# Patient Record
Sex: Female | Born: 1935 | Race: White | Hispanic: No | Smoking: Former smoker
Health system: Southern US, Community
[De-identification: ages and names within clinical notes are randomized; demographics above are authoritative.]

## PROBLEM LIST (undated history)

## (undated) DIAGNOSIS — R609 Edema, unspecified: Secondary | ICD-10-CM

## (undated) DIAGNOSIS — I739 Peripheral vascular disease, unspecified: Secondary | ICD-10-CM

## (undated) DIAGNOSIS — S72001A Fracture of unspecified part of neck of right femur, initial encounter for closed fracture: Secondary | ICD-10-CM

## (undated) DIAGNOSIS — F0280 Dementia in other diseases classified elsewhere without behavioral disturbance: Secondary | ICD-10-CM

## (undated) DIAGNOSIS — K5900 Constipation, unspecified: Secondary | ICD-10-CM

## (undated) DIAGNOSIS — K5730 Diverticulosis of large intestine without perforation or abscess without bleeding: Secondary | ICD-10-CM

## (undated) DIAGNOSIS — F32A Depression, unspecified: Secondary | ICD-10-CM

## (undated) DIAGNOSIS — E538 Deficiency of other specified B group vitamins: Secondary | ICD-10-CM

## (undated) DIAGNOSIS — D509 Iron deficiency anemia, unspecified: Secondary | ICD-10-CM

## (undated) DIAGNOSIS — G309 Alzheimer's disease, unspecified: Secondary | ICD-10-CM

## (undated) DIAGNOSIS — C180 Malignant neoplasm of cecum: Secondary | ICD-10-CM

## (undated) DIAGNOSIS — K219 Gastro-esophageal reflux disease without esophagitis: Secondary | ICD-10-CM

## (undated) DIAGNOSIS — F329 Major depressive disorder, single episode, unspecified: Secondary | ICD-10-CM

## (undated) DIAGNOSIS — J029 Acute pharyngitis, unspecified: Secondary | ICD-10-CM

## (undated) DIAGNOSIS — H269 Unspecified cataract: Secondary | ICD-10-CM

## (undated) DIAGNOSIS — R1310 Dysphagia, unspecified: Secondary | ICD-10-CM

## (undated) DIAGNOSIS — A048 Other specified bacterial intestinal infections: Secondary | ICD-10-CM

## (undated) DIAGNOSIS — K625 Hemorrhage of anus and rectum: Secondary | ICD-10-CM

## (undated) DIAGNOSIS — K2289 Other specified disease of esophagus: Secondary | ICD-10-CM

## (undated) DIAGNOSIS — M150 Primary generalized (osteo)arthritis: Secondary | ICD-10-CM

## (undated) DIAGNOSIS — M810 Age-related osteoporosis without current pathological fracture: Secondary | ICD-10-CM

## (undated) DIAGNOSIS — I2510 Atherosclerotic heart disease of native coronary artery without angina pectoris: Secondary | ICD-10-CM

## (undated) DIAGNOSIS — R079 Chest pain, unspecified: Secondary | ICD-10-CM

## (undated) DIAGNOSIS — K228 Other specified diseases of esophagus: Secondary | ICD-10-CM

## (undated) DIAGNOSIS — I10 Essential (primary) hypertension: Secondary | ICD-10-CM

## (undated) DIAGNOSIS — C189 Malignant neoplasm of colon, unspecified: Secondary | ICD-10-CM

## (undated) DIAGNOSIS — Z9181 History of falling: Secondary | ICD-10-CM

## (undated) DIAGNOSIS — E785 Hyperlipidemia, unspecified: Secondary | ICD-10-CM

## (undated) HISTORY — DX: Malignant neoplasm of cecum: C18.0

## (undated) HISTORY — DX: Iron deficiency anemia, unspecified: D50.9

## (undated) HISTORY — DX: Diverticulosis of large intestine without perforation or abscess without bleeding: K57.30

## (undated) HISTORY — DX: Other specified bacterial intestinal infections: A04.8

## (undated) HISTORY — PX: GALLBLADDER SURGERY: SHX652

## (undated) HISTORY — DX: Age-related osteoporosis without current pathological fracture: M81.0

## (undated) HISTORY — DX: Peripheral vascular disease, unspecified: I73.9

## (undated) HISTORY — DX: Alzheimer's disease, unspecified: G30.9

## (undated) HISTORY — DX: Essential (primary) hypertension: I10

## (undated) HISTORY — DX: Gastro-esophageal reflux disease without esophagitis: K21.9

## (undated) HISTORY — DX: Hemorrhage of anus and rectum: K62.5

## (undated) HISTORY — PX: RIGHT COLECTOMY: SHX853

## (undated) HISTORY — DX: Hyperlipidemia, unspecified: E78.5

## (undated) HISTORY — DX: Fracture of unspecified part of neck of right femur, initial encounter for closed fracture: S72.001A

## (undated) HISTORY — DX: Malignant neoplasm of colon, unspecified: C18.9

## (undated) HISTORY — DX: Other specified disease of esophagus: K22.89

## (undated) HISTORY — DX: Depression, unspecified: F32.A

## (undated) HISTORY — PX: CHOLECYSTECTOMY: SHX55

## (undated) HISTORY — DX: Other specified diseases of esophagus: K22.8

## (undated) HISTORY — DX: Unspecified cataract: H26.9

## (undated) HISTORY — DX: Edema, unspecified: R60.9

## (undated) HISTORY — DX: History of falling: Z91.81

## (undated) HISTORY — DX: Chest pain, unspecified: R07.9

## (undated) HISTORY — DX: Atherosclerotic heart disease of native coronary artery without angina pectoris: I25.10

## (undated) HISTORY — DX: Acute pharyngitis, unspecified: J02.9

## (undated) HISTORY — DX: Dementia in other diseases classified elsewhere, unspecified severity, without behavioral disturbance, psychotic disturbance, mood disturbance, and anxiety: F02.80

## (undated) HISTORY — DX: Deficiency of other specified B group vitamins: E53.8

## (undated) HISTORY — DX: Primary generalized (osteo)arthritis: M15.0

## (undated) HISTORY — DX: Dysphagia, unspecified: R13.10

## (undated) HISTORY — DX: Constipation, unspecified: K59.00

## (undated) HISTORY — DX: Major depressive disorder, single episode, unspecified: F32.9

---

## 1990-01-22 DIAGNOSIS — C180 Malignant neoplasm of cecum: Secondary | ICD-10-CM

## 1990-01-22 HISTORY — DX: Malignant neoplasm of cecum: C18.0

## 1993-01-22 HISTORY — PX: ABDOMINAL HYSTERECTOMY: SHX81

## 1997-10-13 ENCOUNTER — Ambulatory Visit (HOSPITAL_COMMUNITY): Admission: RE | Admit: 1997-10-13 | Discharge: 1997-10-13 | Payer: Self-pay | Admitting: Internal Medicine

## 1999-01-05 ENCOUNTER — Encounter (HOSPITAL_BASED_OUTPATIENT_CLINIC_OR_DEPARTMENT_OTHER): Payer: Self-pay | Admitting: Internal Medicine

## 1999-01-05 ENCOUNTER — Encounter: Admission: RE | Admit: 1999-01-05 | Discharge: 1999-01-05 | Payer: Self-pay | Admitting: Internal Medicine

## 1999-01-11 ENCOUNTER — Encounter: Admission: RE | Admit: 1999-01-11 | Discharge: 1999-01-11 | Payer: Self-pay | Admitting: Internal Medicine

## 1999-01-11 ENCOUNTER — Encounter (HOSPITAL_BASED_OUTPATIENT_CLINIC_OR_DEPARTMENT_OTHER): Payer: Self-pay | Admitting: Internal Medicine

## 1999-01-23 HISTORY — PX: CATARACT EXTRACTION: SUR2

## 1999-02-13 ENCOUNTER — Other Ambulatory Visit: Admission: RE | Admit: 1999-02-13 | Discharge: 1999-02-13 | Payer: Self-pay | Admitting: Obstetrics and Gynecology

## 1999-10-13 ENCOUNTER — Ambulatory Visit (HOSPITAL_COMMUNITY): Admission: RE | Admit: 1999-10-13 | Discharge: 1999-10-13 | Payer: Self-pay | Admitting: *Deleted

## 2000-01-22 ENCOUNTER — Encounter (HOSPITAL_BASED_OUTPATIENT_CLINIC_OR_DEPARTMENT_OTHER): Payer: Self-pay | Admitting: Internal Medicine

## 2000-01-22 ENCOUNTER — Encounter: Admission: RE | Admit: 2000-01-22 | Discharge: 2000-01-22 | Payer: Self-pay | Admitting: Internal Medicine

## 2000-06-07 ENCOUNTER — Other Ambulatory Visit: Admission: RE | Admit: 2000-06-07 | Discharge: 2000-06-07 | Payer: Self-pay | Admitting: Obstetrics and Gynecology

## 2000-11-01 ENCOUNTER — Ambulatory Visit (HOSPITAL_COMMUNITY): Admission: RE | Admit: 2000-11-01 | Discharge: 2000-11-01 | Payer: Self-pay | Admitting: Internal Medicine

## 2000-11-01 ENCOUNTER — Encounter: Payer: Self-pay | Admitting: Internal Medicine

## 2000-12-09 ENCOUNTER — Other Ambulatory Visit: Admission: RE | Admit: 2000-12-09 | Discharge: 2000-12-09 | Payer: Self-pay | Admitting: Obstetrics and Gynecology

## 2001-01-27 ENCOUNTER — Encounter: Admission: RE | Admit: 2001-01-27 | Discharge: 2001-01-27 | Payer: Self-pay | Admitting: Internal Medicine

## 2001-01-27 ENCOUNTER — Encounter (HOSPITAL_BASED_OUTPATIENT_CLINIC_OR_DEPARTMENT_OTHER): Payer: Self-pay | Admitting: Internal Medicine

## 2001-03-30 ENCOUNTER — Emergency Department (HOSPITAL_COMMUNITY): Admission: EM | Admit: 2001-03-30 | Discharge: 2001-03-30 | Payer: Self-pay | Admitting: Emergency Medicine

## 2001-06-09 ENCOUNTER — Other Ambulatory Visit: Admission: RE | Admit: 2001-06-09 | Discharge: 2001-06-09 | Payer: Self-pay | Admitting: Obstetrics and Gynecology

## 2002-01-22 DIAGNOSIS — A048 Other specified bacterial intestinal infections: Secondary | ICD-10-CM

## 2002-01-22 HISTORY — DX: Other specified bacterial intestinal infections: A04.8

## 2002-02-04 ENCOUNTER — Encounter (HOSPITAL_BASED_OUTPATIENT_CLINIC_OR_DEPARTMENT_OTHER): Payer: Self-pay | Admitting: Internal Medicine

## 2002-02-04 ENCOUNTER — Encounter: Admission: RE | Admit: 2002-02-04 | Discharge: 2002-02-04 | Payer: Self-pay | Admitting: Internal Medicine

## 2002-09-22 ENCOUNTER — Ambulatory Visit (HOSPITAL_COMMUNITY): Admission: RE | Admit: 2002-09-22 | Discharge: 2002-09-22 | Payer: Self-pay | Admitting: Internal Medicine

## 2002-09-24 ENCOUNTER — Encounter: Payer: Self-pay | Admitting: Internal Medicine

## 2002-09-24 ENCOUNTER — Ambulatory Visit (HOSPITAL_COMMUNITY): Admission: RE | Admit: 2002-09-24 | Discharge: 2002-09-24 | Payer: Self-pay | Admitting: Internal Medicine

## 2003-02-04 ENCOUNTER — Encounter: Admission: RE | Admit: 2003-02-04 | Discharge: 2003-02-04 | Payer: Self-pay | Admitting: Internal Medicine

## 2003-02-18 ENCOUNTER — Ambulatory Visit (HOSPITAL_COMMUNITY): Admission: RE | Admit: 2003-02-18 | Discharge: 2003-02-18 | Payer: Self-pay | Admitting: Cardiology

## 2004-04-20 ENCOUNTER — Ambulatory Visit (HOSPITAL_COMMUNITY): Admission: RE | Admit: 2004-04-20 | Discharge: 2004-04-20 | Payer: Self-pay | Admitting: Internal Medicine

## 2005-09-05 ENCOUNTER — Encounter: Admission: RE | Admit: 2005-09-05 | Discharge: 2005-09-05 | Payer: Self-pay | Admitting: Otolaryngology

## 2005-11-22 ENCOUNTER — Ambulatory Visit: Payer: Self-pay | Admitting: Internal Medicine

## 2006-02-05 ENCOUNTER — Ambulatory Visit: Payer: Self-pay | Admitting: Internal Medicine

## 2006-05-22 ENCOUNTER — Ambulatory Visit: Payer: Self-pay | Admitting: Internal Medicine

## 2006-06-02 ENCOUNTER — Inpatient Hospital Stay (HOSPITAL_COMMUNITY): Admission: EM | Admit: 2006-06-02 | Discharge: 2006-06-06 | Payer: Self-pay | Admitting: Emergency Medicine

## 2006-06-03 ENCOUNTER — Encounter (INDEPENDENT_AMBULATORY_CARE_PROVIDER_SITE_OTHER): Payer: Self-pay | Admitting: Cardiology

## 2006-06-24 ENCOUNTER — Ambulatory Visit: Payer: Self-pay | Admitting: Internal Medicine

## 2006-06-24 DIAGNOSIS — K5730 Diverticulosis of large intestine without perforation or abscess without bleeding: Secondary | ICD-10-CM

## 2006-06-24 HISTORY — DX: Diverticulosis of large intestine without perforation or abscess without bleeding: K57.30

## 2006-12-03 ENCOUNTER — Ambulatory Visit (HOSPITAL_COMMUNITY): Admission: RE | Admit: 2006-12-03 | Discharge: 2006-12-03 | Payer: Self-pay | Admitting: *Deleted

## 2008-09-21 ENCOUNTER — Observation Stay (HOSPITAL_COMMUNITY): Admission: EM | Admit: 2008-09-21 | Discharge: 2008-09-28 | Payer: Self-pay | Admitting: Emergency Medicine

## 2009-01-04 DIAGNOSIS — Z9181 History of falling: Secondary | ICD-10-CM

## 2009-01-04 HISTORY — DX: History of falling: Z91.81

## 2009-07-19 ENCOUNTER — Encounter: Admission: RE | Admit: 2009-07-19 | Discharge: 2009-07-19 | Payer: Self-pay | Admitting: Internal Medicine

## 2009-09-05 ENCOUNTER — Encounter: Admission: RE | Admit: 2009-09-05 | Discharge: 2009-09-05 | Payer: Self-pay | Admitting: Neurology

## 2009-12-11 ENCOUNTER — Inpatient Hospital Stay (HOSPITAL_COMMUNITY)
Admission: EM | Admit: 2009-12-11 | Discharge: 2009-12-19 | Payer: Self-pay | Source: Home / Self Care | Admitting: Emergency Medicine

## 2009-12-12 ENCOUNTER — Encounter (INDEPENDENT_AMBULATORY_CARE_PROVIDER_SITE_OTHER): Payer: Self-pay | Admitting: Internal Medicine

## 2010-02-12 ENCOUNTER — Encounter: Payer: Self-pay | Admitting: *Deleted

## 2010-04-04 LAB — MRSA PCR SCREENING: MRSA by PCR: NEGATIVE

## 2010-04-04 LAB — POCT I-STAT, CHEM 8
HCT: 34 % — ABNORMAL LOW (ref 36.0–46.0)
Hemoglobin: 11.6 g/dL — ABNORMAL LOW (ref 12.0–15.0)
Potassium: 3.4 mEq/L — ABNORMAL LOW (ref 3.5–5.1)
Sodium: 143 mEq/L (ref 135–145)
TCO2: 27 mmol/L (ref 0–100)

## 2010-04-04 LAB — CK TOTAL AND CKMB (NOT AT ARMC): Total CK: 201 U/L — ABNORMAL HIGH (ref 7–177)

## 2010-04-04 LAB — VITAMIN B12: Vitamin B-12: 473 pg/mL (ref 211–911)

## 2010-04-04 LAB — LIPID PANEL
HDL: 48 mg/dL (ref >39)
Total CHOL/HDL Ratio: 4.4 RATIO
Triglycerides: 104 mg/dL (ref <150)

## 2010-04-04 LAB — URINALYSIS, ROUTINE W REFLEX MICROSCOPIC
Nitrite: NEGATIVE
Protein, ur: NEGATIVE mg/dL
Specific Gravity, Urine: 1.021 (ref 1.005–1.030)
Urobilinogen, UA: 0.2 mg/dL (ref 0.0–1.0)

## 2010-04-04 LAB — CBC
Hemoglobin: 11.6 g/dL — ABNORMAL LOW (ref 12.0–15.0)
MCHC: 32.6 g/dL (ref 30.0–36.0)
MCHC: 32.6 g/dL (ref 30.0–36.0)
Platelets: 211 10*3/uL (ref 150–400)
Platelets: 232 10*3/uL (ref 150–400)
RDW: 13.9 % (ref 11.5–15.5)
RDW: 14 % (ref 11.5–15.5)

## 2010-04-04 LAB — COMPREHENSIVE METABOLIC PANEL
ALT: 13 U/L (ref 0–35)
AST: 21 U/L (ref 0–37)
Calcium: 8.7 mg/dL (ref 8.4–10.5)
Creatinine, Ser: 0.62 mg/dL (ref 0.4–1.2)
GFR calc Af Amer: >60 mL/min (ref >60)
Sodium: 142 mEq/L (ref 135–145)
Total Protein: 5.8 g/dL — ABNORMAL LOW (ref 6.0–8.3)

## 2010-04-04 LAB — DIFFERENTIAL
Basophils Absolute: 0 10*3/uL (ref 0.0–0.1)
Basophils Relative: 0 % (ref 0–1)
Eosinophils Absolute: 0.1 10*3/uL (ref 0.0–0.7)
Lymphocytes Relative: 11 % — ABNORMAL LOW (ref 12–46)
Monocytes Absolute: 0.8 10*3/uL (ref 0.1–1.0)
Neutrophils Relative %: 80 % — ABNORMAL HIGH (ref 43–77)

## 2010-04-04 LAB — HEMOGLOBIN A1C
Hgb A1c MFr Bld: 5.4 % (ref <5.7)
Mean Plasma Glucose: 108 mg/dL (ref <117)

## 2010-04-04 LAB — PHOSPHORUS: Phosphorus: 3.8 mg/dL (ref 2.3–4.6)

## 2010-04-04 LAB — CARDIAC PANEL(CRET KIN+CKTOT+MB+TROPI)
CK, MB: 5.9 ng/mL — ABNORMAL HIGH (ref 0.3–4.0)
Relative Index: 2.7 — ABNORMAL HIGH (ref 0.0–2.5)
Relative Index: 2.7 — ABNORMAL HIGH (ref 0.0–2.5)
Total CK: 219 U/L — ABNORMAL HIGH (ref 7–177)
Troponin I: 0.03 ng/mL (ref 0.00–0.06)

## 2010-04-04 LAB — RPR: RPR Ser Ql: NONREACTIVE

## 2010-04-28 LAB — BRAIN NATRIURETIC PEPTIDE: Pro B Natriuretic peptide (BNP): 249 pg/mL — ABNORMAL HIGH (ref 0.0–100.0)

## 2010-04-28 LAB — CARDIAC PANEL(CRET KIN+CKTOT+MB+TROPI)
CK, MB: 28.4 ng/mL — ABNORMAL HIGH (ref 0.3–4.0)
Relative Index: 1.1 (ref 0.0–2.5)
Total CK: 2377 U/L — ABNORMAL HIGH (ref 7–177)
Troponin I: 0.04 ng/mL (ref 0.00–0.06)
Troponin I: 0.05 ng/mL (ref 0.00–0.06)

## 2010-04-28 LAB — CBC
HCT: 32 % — ABNORMAL LOW (ref 36.0–46.0)
Hemoglobin: 10.7 g/dL — ABNORMAL LOW (ref 12.0–15.0)
MCHC: 33.4 g/dL (ref 30.0–36.0)
MCHC: 33.7 g/dL (ref 30.0–36.0)
MCHC: 33.9 g/dL (ref 30.0–36.0)
MCV: 86.3 fL (ref 78.0–100.0)
MCV: 86.5 fL (ref 78.0–100.0)
RBC: 3.46 MIL/uL — ABNORMAL LOW (ref 3.87–5.11)
RBC: 3.7 MIL/uL — ABNORMAL LOW (ref 3.87–5.11)
RBC: 3.82 MIL/uL — ABNORMAL LOW (ref 3.87–5.11)
RDW: 14.2 % (ref 11.5–15.5)
RDW: 14.9 % (ref 11.5–15.5)
WBC: 7.4 10*3/uL (ref 4.0–10.5)

## 2010-04-28 LAB — PROTIME-INR
INR: 1.2 (ref 0.00–1.49)
Prothrombin Time: 15.1 seconds (ref 11.6–15.2)

## 2010-04-28 LAB — COMPREHENSIVE METABOLIC PANEL
ALT: 22 U/L (ref 0–35)
Calcium: 8.6 mg/dL (ref 8.4–10.5)
Creatinine, Ser: 0.67 mg/dL (ref 0.4–1.2)
GFR calc Af Amer: >60 mL/min (ref >60)
GFR calc non Af Amer: >60 mL/min (ref >60)
Glucose, Bld: 72 mg/dL (ref 70–99)
Sodium: 139 mEq/L (ref 135–145)
Total Protein: 5.6 g/dL — ABNORMAL LOW (ref 6.0–8.3)

## 2010-04-28 LAB — BASIC METABOLIC PANEL
CO2: 24 mEq/L (ref 19–32)
CO2: 25 mEq/L (ref 19–32)
CO2: 26 mEq/L (ref 19–32)
Calcium: 8.9 mg/dL (ref 8.4–10.5)
Calcium: 9.6 mg/dL (ref 8.4–10.5)
Chloride: 105 mEq/L (ref 96–112)
Chloride: 106 mEq/L (ref 96–112)
Creatinine, Ser: 0.64 mg/dL (ref 0.4–1.2)
Creatinine, Ser: 0.65 mg/dL (ref 0.4–1.2)
Creatinine, Ser: 0.79 mg/dL (ref 0.4–1.2)
GFR calc Af Amer: >60 mL/min (ref >60)
GFR calc Af Amer: >60 mL/min (ref >60)
GFR calc Af Amer: >60 mL/min (ref >60)
GFR calc non Af Amer: >60 mL/min (ref >60)
Glucose, Bld: 103 mg/dL — ABNORMAL HIGH (ref 70–99)
Glucose, Bld: 107 mg/dL — ABNORMAL HIGH (ref 70–99)
Potassium: 3.2 mEq/L — ABNORMAL LOW (ref 3.5–5.1)
Sodium: 136 mEq/L (ref 135–145)

## 2010-04-28 LAB — GLUCOSE, CAPILLARY: Glucose-Capillary: 86 mg/dL (ref 70–99)

## 2010-04-28 LAB — CK: Total CK: 78 U/L (ref 7–177)

## 2010-04-28 LAB — LIPID PANEL
LDL Cholesterol: 115 mg/dL — ABNORMAL HIGH (ref 0–99)
Total CHOL/HDL Ratio: 3.1 RATIO
VLDL: 11 mg/dL (ref 0–40)

## 2010-04-28 LAB — APTT: aPTT: 30 seconds (ref 24–37)

## 2010-04-28 LAB — MAGNESIUM: Magnesium: 2.2 mg/dL (ref 1.5–2.5)

## 2010-04-29 LAB — BASIC METABOLIC PANEL
BUN: 20 mg/dL (ref 6–23)
CO2: 25 mEq/L (ref 19–32)
Chloride: 103 mEq/L (ref 96–112)
Creatinine, Ser: 0.85 mg/dL (ref 0.4–1.2)
Potassium: 3.6 mEq/L (ref 3.5–5.1)

## 2010-04-29 LAB — CBC
Hemoglobin: 10.8 g/dL — ABNORMAL LOW (ref 12.0–15.0)
RBC: 3.71 MIL/uL — ABNORMAL LOW (ref 3.87–5.11)
RDW: 14.7 % (ref 11.5–15.5)

## 2010-04-29 LAB — POCT CARDIAC MARKERS
CKMB, poc: 27.1 ng/mL (ref 1.0–8.0)
CKMB, poc: 43.8 ng/mL (ref 1.0–8.0)
Myoglobin, poc: 416 ng/mL (ref 12–200)
Myoglobin, poc: >500 ng/mL (ref 12–200)
Troponin i, poc: <0.05 ng/mL (ref 0.00–0.09)
Troponin i, poc: <0.05 ng/mL (ref 0.00–0.09)

## 2010-04-29 LAB — CK TOTAL AND CKMB (NOT AT ARMC)
CK, MB: 47.1 ng/mL — ABNORMAL HIGH (ref 0.3–4.0)
CK, MB: 62.3 ng/mL — ABNORMAL HIGH (ref 0.3–4.0)
Relative Index: 1.7 (ref 0.0–2.5)
Relative Index: 2.7 — ABNORMAL HIGH (ref 0.0–2.5)
Total CK: 2330 U/L — ABNORMAL HIGH (ref 7–177)
Total CK: 2726 U/L — ABNORMAL HIGH (ref 7–177)

## 2010-04-29 LAB — URINALYSIS, ROUTINE W REFLEX MICROSCOPIC
Ketones, ur: 15 mg/dL — AB
Nitrite: NEGATIVE
Protein, ur: 30 mg/dL — AB
Specific Gravity, Urine: 1.022 (ref 1.005–1.030)
Urobilinogen, UA: 0.2 mg/dL (ref 0.0–1.0)

## 2010-04-29 LAB — URINE MICROSCOPIC-ADD ON

## 2010-04-29 LAB — DIFFERENTIAL
Basophils Absolute: 0 10*3/uL (ref 0.0–0.1)
Lymphocytes Relative: 7 % — ABNORMAL LOW (ref 12–46)
Monocytes Absolute: 0.9 10*3/uL (ref 0.1–1.0)
Neutro Abs: 9.3 10*3/uL — ABNORMAL HIGH (ref 1.7–7.7)

## 2010-04-29 LAB — TROPONIN I
Troponin I: 0.06 ng/mL (ref 0.00–0.06)
Troponin I: 0.08 ng/mL — ABNORMAL HIGH (ref 0.00–0.06)

## 2010-04-29 LAB — CK: Total CK: 2379 U/L — ABNORMAL HIGH (ref 7–177)

## 2010-06-06 NOTE — Consult Note (Signed)
NAMEELIANAH, [name]                ACCOUNT NO.:  [id number]   MEDICAL RECORD NO.:  [id number]          PATIENT TYPE:  OBV   LOCATION:  5527                         FACILITY:  [facility]   PHYSICIAN:  Georga Hacking, M.D.DATE OF BIRTH:  May 17, 1922   DATE OF CONSULTATION:  09/22/2008  DATE OF DISCHARGE:                                 CONSULTATION   REASON FOR CONSULTATION:  Chest pain.   HISTORY:  The patient is an 77 year old female with a very complicated  history.  She has longstanding chest discomfort for many years.  She had  a cardiac catheterization done in January 2005 at which point time she  had a segmental 40% LAD stenosis, 40% intermediate stenosis and moderate  calcified ostial right coronary artery stenosis.  She was treated  medically and had a repeat catheterization done Jun 04, 2006, that  basically confirmed the same findings.  It was recommended that she be  treated medically and she has done reasonably well since that time.  I  periodically see her and she intermittently complains of atypical chest  pain, but does not have what is felt to be limiting exertional angina.  The patient has had a question of whether she has early dementia, but  continues to live at home.  She evidently fell Sunday when she tripped  in a doorway and broke her wrist and was seen at [facility] and was  later seen at the orthopedic office and had her wrist splinted on Monday  and casted on Monday.  She evidently fell yesterday and was on the floor  about 3 hours.  She was later taken by her daughter to her primary  physician's office and was complaining of chest pain that was sharp at  that time she was in her primary suggestions office and was sent to the  hospital here.  The daughter reported that prior to the complain of  chest pain in the primary physician's office that she did not really  complain of any chest pain.  The patient was transported here and an EKG  was unchanged from  previous with no significant T-wave changes noted.   LABORATORY DATA:  Showed elevation of her total CPK 2726 with a troponin  of 0.06.  This is consistent with rhabdomyolysis as the ratio was low.  Since admission, she has not really had recurrent chest pain.  When I  asked her why she came to the hospital, she said initially it was  because she had fallen and broke her wrist, and in retrospect when I  pressed her, she did complain of some chest pain.  She has not had  recurrent chest pains since admission and does not have chest pain  definitely with exertion that I can tell.   PAST MEDICAL HISTORY:  1. History of hypertension previously.  2. Known normochromic anemia that has been worked in the past.  3. History of adenocarcinoma of the cecum.  4. Possible early dementia.  5. Falling spells recently.   PAST SURGICAL HISTORY:  She has had a partial colectomy and also notes  hemorrhoidectomy  in the past also.  The chart reports intolerances to  captopril, verapamil, Niaspan and Urispas.   MEDICATIONS:  Listed as what she was on previously include:  1. Nitroglycerin.  2. Diovan 160 mg daily.  3. Caduet 10/40 mg daily.  4. Metoprolol 50 mg daily.  5. Lasix 20 mg daily.  6. Pentoxifylline 400 mg t.i.d.  7. Aspirin daily.   SOCIAL HISTORY:  She is widowed and evidently lives alone.  Has an  attentive daughter.  She quit smoking several years ago.  She does not  use definite alcohol to excess.   FAMILY HISTORY:  Recorded in old chart, reviewed and unchanged.   REVIEW OF SYSTEMS:  Somewhat difficult.  She has not had any weight  loss.  She denies any headaches.  She does not have PND or orthopnea or  significant dyspnea.  She has had some constipation previously in the  past.  She has no urinary symptoms.  She has been unsteady on her feet  and according to the daughter has been falling some recently.  She has a  history of H.  Pylori positivity in the past.  Other than as  noted  above, the remainder of systems is unremarkable.   PHYSICAL EXAMINATION:  GENERAL:  She is an elderly female who was  somewhat addled and easily distractible.  She, however, is oriented to  time, place and date.  VITAL SIGNS:  Blood pressure is currently 154/61, pulse currently 79,  respirations were 20, oxygen saturations were 94%.  SKIN:  Warm and dry.  HEENT:  EOMI.  PERRLA.  Conjunctivae and sclerae are clear.  No carotid  bruits noted.  LUNGS:  Clear bilaterally.  CARDIOVASCULAR:  Exam showed normal S1-S2.  There was occasional early  heartbeats heard.  No S3 and no murmur.  ABDOMEN:  Obese, soft and  nontender.  Pulses are diminished peripherally.   STUDIES:  A 12-lead EKG shows a heart spinal axis.  No significant T-  wave abnormalities are present.   LABORATORY DATA:  Hemoglobin of 10.8, hematocrit of 31.8, 11,000 WBCs.  PT and PTT were normal.  Chemistry panel today shows a sodium of 139,  potassium 3.5, chloride 108, CO2 of 21, glucose 72, BUN 14, creatinine  0.67.  SGOT is mildly elevated.  CPK this morning is 2377 with 28 units  of MB noted.  Index is 1.2.  Troponin is 0.06.   IMPRESSION:  1. Vague episodes of chest pain in a patient who is an unreliable and      poor historian.  Her EKG is unremarkable.  I would interpret the      cardiac enzymes as being more consistent with rhabdomyolysis due to      her recently having laid on the floor 3 hours yesterday.  This pain      does not sound like unstable angina.  In view of a previous cardiac      workup a couple of years ago, I do not think in the absence of      predominant symptoms or overt evidence of ischemia or EKG changes,      that I would pursue this any further at this time in light of the      difficult history.  2. Coronary artery disease with moderate calcified ostial right      coronary artery disease, moderate disease in the left coronary      system.  3. Possible early dementia.  4.  Normochromic anemia.  5. Hyperlipidemia under treatment.  6. Hypertension, not well controlled today.  7. Rhabdomyolysis.   RECOMMENDATIONS:  I spoke with the daughter by phone to confirm some of  the history.  At the present time, I would not work the chest pain up  further at this time.  Of paramount importance is her safety at home.  I  would check orthostatic symptoms, a chest x-ray and a CT scan to rule  out pulmonary emboli as a cause of chest pain in view of her recent  fall.      Georga Hacking, M.D.  Electronically Signed     WST/MEDQ  D:  09/22/2008  T:  09/22/2008  Job:  [id number]   cc:   [facility]

## 2010-06-06 NOTE — Discharge Summary (Signed)
[name], [name]                ACCOUNT NO.:  [id number]   MEDICAL RECORD NO.:  [id number]          PATIENT TYPE:  INP   LOCATION:  1404                         FACILITY:  [facility]   PHYSICIAN:  Elliot Cousin, M.D.    DATE OF BIRTH:  [birth year]   DATE OF ADMISSION:  06/02/2006  DATE OF DISCHARGE:  06/06/2006                               DISCHARGE SUMMARY   DISCHARGE DIAGNOSES:  1. Chest pain.  Myocardial infarction ruled out by cardiac enzymes.  2. Coronary artery disease.  Cardiac catheterization by Dr. Viann Fish on [phone], revealed calcified moderate ostial      right coronary stenosis unchanged from 2005, mild irregularity      proximally and the other vessels with a 40% proximal intermediate      stenosis;  normal left ventricular function.  3. Hypertension.  4. Normocytic anemia.  5. Mild dementia.  6. Mild deconditioning.  7. Ill-defined 5 mm pulmonary nodule in the right upper lobe.      Followup CT scan of the chest in 6 months is recommended.   SECONDARY DISCHARGE DIAGNOSES:  Please see the dictated history and  physical.   MEDICATIONS:  1. Nitroglycerin 0.4 mg sublingual every 5 minutes x3 p.r.n. for chest      pain.  2. Diovan 160 mg restart on [id number].  3. Caduet 10/40 mg daily.  4. Evista as previously prescribed.  5. Toprol 50 mg daily.  6. Lasix 20 mg daily.  7. Pentoxifylline 400 mg t.i.d.  8. Detrol LA 4 mg daily.  9. Aspirin 81 mg daily.  10.Multivitamin with iron once daily.  11.Mylanta as needed and as directed for indigestion.   DISCHARGE DISPOSITION:  The patient was discharged to home in improved  and stable condition on May15,2008.  She was advised to follow up  with her primary care physician at [facility] in 1 week and  cardiologist, Dr. Donnie Aho, in 2 weeks.   CONSULTATIONS:  Dr. Eldridge Dace and Dr. Donnie Aho.   PROCEDURES PERFORMED:  1. CT scan of the chest on May11,2008.  The results revealed no      evidence of acute  pulmonary embolus.  Ill-defined 5 mm pulmonary      nodule in the right upper lobe.  Follow-up chest CT at 6 months is      recommended. Right lower lobe plate-like atelectasis, probable      peribronchial atelectasis in the right middle lobe.  Small right      hernia.  2. CT scan of the head without contrast on [id number].  The      results revealed atrophy and chronic small vessel ischemic change,      no acute abnormalities.  3. Ultrasound of the abdomen.  The results revealed no focal lesions      identified in the liver, gallbladder removed, small right pleural      effusion.   HISTORY OF PRESENT ILLNESS:  The patient is a 77 year old woman with a  past medical history significant for coronary artery disease,  hypertension, and colon cancer, who presented to  the emergency  department on [id number], with a chief complaint of chest pain.  The patient was subsequently admitted for further evaluation and  management.   For additional details please see the dictated history and physical.   HOSPITAL COURSE:  Problem 1. CHEST PAIN/CORONARY ARTERY DISEASE.  The  patient's EKG on admission revealed normal sinus rhythm with a heart  rate of 87 beats per minute and evidence of inferior and anterior Q-  waves.  There were no acute ST- or T-wave abnormalities.  On exam, she  was hemodynamically stable.  For further evaluation  cardiac enzymes  were ordered as well as a CT scan of the chest.  The cardiac enzymes  remained negative throughout the hospital course.  The CT scan of the  chest was negative for pulmonary embolism.  The patient was started on  metoprolol as had been previously ordered.  Prophylactic Lovenox and  Protonix were given.  Cardiologist, Dr. Donnie Aho, was consulted.  Dr.  York Spaniel colleague, Dr. Eldridge Dace, provided the initial consultation. Dr.  Eldridge Dace agreed with the medical management.  Dr. Donnie Aho evaluated the  patient the following day.  He started full dose Lovenox  empirically.  Per his evaluation, the patient would need another cardiac  catheterization.  The cardiac catheterization was performed on  May13,2008, and revealed a stable right coronary artery lesion.  He recommended continued medical management.  At the time of hospital  discharge, the patient was asymptomatic with regards to chest pain and  shortness of breath.  However, prior to hospital discharge, an  ultrasound of the abdomen was ordered to rule out a biliary cause of  chest pain.  The ultrasound of the abdomen was negative for any acute  abnormalities; it revealed that the patient's gallbladder had been  removed.   Problem 2. HYPERTENSION.  The patient was maintained on metoprolol 25 mg  b.i.d.  The Diovan and the Caduet were withheld because of low normal  blood pressures.  At the time of hospital discharge, the patient was  advised to resume Toprol 50 mg daily and Caduet 10/40 mg daily.  She was  advised to hold the Diovan for two more days.   Problem 3. NORMOCYTIC ANEMIA.  The patient was found to be mildly anemic  during the hospitalization.  Her hemoglobin ranged from 10.5 to 11.7.  Iron studies were ordered and revealed a vitamin B12 of 348, RBC folate  of 857, total iron of 36, and ferritin of 317.  The patient was started  on a multivitamin with iron and an iron supplement.  Further evaluation  per her primary care physician.   Problem 4. MILD MEMORY IMPAIRMENT QUESTION MILD DEMENTIA.  The patient  was evaluated for mild memory impairment with a CT scan of the head.  The CT scan of the head revealed no acute disease although there was  evidence of atrophy and small vessel disease.  As indicated above, the  patient's vitamin B12 and folate levels were within normal limits.  Her  RPR was nonreactive.   Problem 5. MILD DECONDITIONING.  Physical and occupational therapists evaluated the patient and recommended home health therapy.  Home health  physical therapy and  occupational therapy were therefore ordered.      Elliot Cousin, M.D.  Electronically Signed     DF/MEDQ  D:  06/06/2006  T:  06/06/2006  Job:  [id number]   cc:   Georga Hacking, M.D.

## 2010-06-06 NOTE — Cardiovascular Report (Signed)
NAMEJAHLIA, OMURA NO.:  [id number]   MEDICAL RECORD NO.:  [id number]          PATIENT TYPE:  OUT   LOCATION:  MDC                          FACILITY:  [facility]   PHYSICIAN:  Georga Hacking, M.D.DATE OF BIRTH:  24-Jun-1922   DATE OF PROCEDURE:  DATE OF DISCHARGE:                            CARDIAC CATHETERIZATION   HISTORY:  The patient is an 77 year old female who presented to the  hospital with chest tightness and pressure, as well as dyspnea.  She is  a vague historian.  She has a history of coronary artery disease treated  medically.  She has continued to have symptoms and had a previous  Cardiolite test done earlier this year.   PROCEDURE:  Left heart catheterization with coronary angiograms and left  ventriculogram.   COMMENTS ABOUT PROCEDURE:  The patient tolerated the procedure well  without difficulty.  Following the procedure, she had good hemostasis  and good peripheral pulses noted.   HEMODYNAMIC DATA:  Aorta post-contrast 142/42, LV post-contrast 142/2-  18.   ANGIOGRAPHIC DATA:  Left ventriculogram:  Performed in the 30-degree RAO  projection.  The aortic valve is normal.  The mitral valve appears  normal.  Left ventricle appears normal in size.  Wall motion is normal.  Estimated ejection fraction is 65%.  Coronary arteries arise and  distribute normally.  There is calcification noted at the ostium of the  right coronary artery.  The left main coronary artery is normal.  The  left anterior descending is moderately tortuous and contains scattered  irregularities.  A diagonal branch has a 40% ostial stenosis.  The  intermediate branch has a 40% proximal stenosis noted.  The circumflex  has scattered irregularities.  The right coronary artery is calcified at  its ostium and has catheter ventricularization with engagement.  There  is felt to be a moderate ostial stenosis noted.  The remainder of the  vessel contained scattered irregularities.   IMPRESSION:  1. Calcified moderate ostial right coronary stenosis, unchanged from      2005.  2. Mild irregularity proximally and the other vessels with a 40%      proximal intermediate stenosis.  3. Normal LV function.   RECOMMENDATIONS:  Evaluate for other sources of chest discomfort,  continue to treat medically at this time, in the absence of clear  symptoms.      Georga Hacking, M.D.  Electronically Signed     WST/MEDQ  D:  06/04/2006  T:  06/04/2006  Job:  [id number]   cc:   Dr. Candice Camp [facility]

## 2010-06-06 NOTE — H&P (Signed)
NAMESPRING, SAN NO.:  [id number]   MEDICAL RECORD NO.:  [id number]          PATIENT TYPE:  INP   LOCATION:  1410                         FACILITY:  [facility]   PHYSICIAN:  Michaelyn Barter, M.D. DATE OF BIRTH:  1922/04/19   DATE OF ADMISSION:  06/01/2006  DATE OF DISCHARGE:                              HISTORY & PHYSICAL   PRIMARY CARE PHYSICIAN:  Dr. Kathy Breach of [facility].   CARDIOLOGIST:  Dr. Viann Fish.   CHIEF COMPLAINT:  Chest pain.   HISTORY OF PRESENT ILLNESS:  Ms Sachdev is an 77 year old female with a  past medical history of coronary artery disease who indicates that she  developed chest tightness, headache, diaphoresis and a sore throat.  Yesterday evening her chest pain was described vaguely as a centrally  located.  She says that it travels up her neck.  It does not travel to  her arm nor to her back.  She indicates that deep breaths makes the pain  worse.  There are no relieving factors, no cough, positive nausea,  positive dizziness.  She denies emesis.  No fevers or chills.  She has  had chest pain in the past but she has never had it as intense as it is  currently.  She also complains of some intermittent shortness of breath.   PAST MEDICAL HISTORY:  1. Chest pain.  The last episode at occurred approximately six months      ago.  She sees Dr. Viann Fish secondary to this.  2. Hypertension.  3. Adenocarcinoma of the cecum.  The patient underwent a right      hemicolectomy in 1986 secondary to this.  4. History of dysphagia.  5. H. pylori positive.  6. Hemorrhoidectomy x2.  7. Cardiac catheterization done Feb 18, 2003, by Dr. Viann Fish.      It revealed coronary artery disease with a calcified ostium of the      right coronary artery which had mild-to-moderate disease of the      proximal left anterior descending and circumflex.   ALLERGIES:  No known drug allergies.   HOME MEDICATIONS:  The patient states that  she takes seven medications,  however she can only remember the names of four :  1. Diovan.  2. Evista.  3. Caduet.  4. Metoprolol.  She does not remember the dosages.   SOCIAL HISTORY:  Cigarettes.  The patient stopped smoking 22 years ago.  She smoked up to one pack per day previously.  Alcohol stopped 20 years  ago; only drank occasionally.   FAMILY HISTORY:  Mother had an enlarged heart.  Father's medical  condition is unknown.   REVIEW OF SYSTEMS:  As per HPI.   PHYSICAL EXAMINATION:  The patient is awake.  She is slightly  uncomfortable.  She points to the central region of her chest multiple  times indicating that it is the source of her pain.  VITALS:  Temperature 98.8, blood pressure 149/71, heart rate 90,  respirations 22, O2 saturation 96%.  HEENT:  Normocephalic, atraumatic, anicteric.  Decreased pupil response  to  light bilaterally.  Oral mucosa is pink.  NECK:  Supple.  No JVD.  No lymphadenopathy.  No thyromegaly.  CARDIOVASCULAR:  S1, S2 present.  Regular rate and rhythm.  RESPIRATORY:  No crackles or wheezes.  ABDOMEN:  Soft, nontender, nondistended.  Positive bowel sounds.  EXTREMITIES:  No leg edema.  NEUROLOGICAL:  The patient is alert and oriented to person, place.  MUSCULOSKELETAL:  5/5.  CHEST X-RAY:  Minimal bronchitic changes with a mild bibasilar  atelectasis.   ASSESSMENT AND PLAN:  1. Chest pain.  The etiology of this is cardiac versus non-cardiac.      Will check cardiac enzymes including troponin I and CK-MB x3 q.8      hours apart to rule-out a cardiac event.  The patient's EKG reveals      normal sinus rhythm.  There are no old EKGs to compare this to.      There are no obvious ST-segment abnormalities.  Will order an      echocardiogram.  Will also check a CT scan of the patient's chest      to rule out  the presence of a PE.  Will check a fasting lipid      profile.  Will provide morphine, oxygen, nitrogen and aspirin.  2. Hypertension.   Will resume the patient's prior hypertensive      medications.  3. History of colon cancer.  Will monitor for now.  4. Gastrointestinal prophylaxis.  Will provide Protonix.  5. Deep venous thrombosis prophylaxis.  Will provide Lovenox.  6. The patient also had a chest x-ray completed which revealed minimal      bronchitic changes with mild bibasilar atelectasis.      Michaelyn Barter, M.D.  Electronically Signed     OR/MEDQ  D:  06/02/2006  T:  06/02/2006  Job:  [id number]   cc:   Kathy Breach  [facility]   Georga Hacking, M.D.  Fax: [phone]  Email: [email or web address]

## 2010-06-06 NOTE — Assessment & Plan Note (Signed)
[facility]                         GASTROENTEROLOGY OFFICE NOTE   SHANI, FITCH                       MRN:          [id number]  DATE:05/22/2006                            DOB:          [birth year]    Miss Cashatt is an 77 year old white female with history of adenocarcinoma  of the cecum in 1986 status post right hemicolectomy. Patient has been  followed closely with colonoscopies, last 1 in September 2005. 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 She was also H. Pylori positive and was treated  for it. She now comes with complaints of intermittent rectal bleeding.  We saw her for the same reason on February 05, 2006, she was constipated  and the bleeding was most likely related to the anal canal. We gave her  Anusol __________ suppositories which she could not take and instead use  Proctofoam at bedtime. Her CEA level in January 2008 was 1.1. Patient  continues to be constipated. She has left and right lower quadrant  abdominal pain and has to take Dulcolax tablets twice a week. Rectal  bleeding continues in small amounts on the toilet tissue, mostly on the  toilet tissue. There has been no rectal pain.   PHYSICAL EXAMINATION:  Blood pressure 118/54, pulse 72, and weight 137  pounds. She was alert and oriented in no distress.  LUNGS: Clear to auscultation.  NECK: Supple with some redundant skin under the chin. Thyroid not  enlarged.  COR: With quiet precordium. Normal S1, S2.  ABDOMEN: Soft with well-healed surgical scars. Normoactive bowel sounds.  No tenderness.  RECTAL AND ANOSCOPIC EXAM: Today shows normal perianal area. Rectal tone  was normal to somewhat decreased. Rectal __________ was normal. There  was small internal hemorrhoids which were not active and there was no  bleeding. Stool was trace  Hemoccult positive.   IMPRESSION:  1. An 77 year old white female with history of adenocarcinoma of the      colon who has recurrent rectal bleeding but I can not demonstrate      any bleeding on anoscopic exam today. I still think that this is an      anal source of bleeding but because of the concern for recurrent      cancer as well as the __________ of progressive constipation, I      think we ought to proceed with colonoscopy. We initially planned to      recall her for colonoscopy in 5 years which would be in 2010 but      because of her persistent symptoms of bleeding, we will proceed      with full colonoscopic exam at this time.  2. Analpram cream given to use on p.r.n. basis.     Hedwig Morton. Juanda Chance, MD  Electronically Signed   DMB/MedQ  DD: 05/22/2006  DT: 05/22/2006  Job #: [id number]   cc:   Lenon Curt. Chilton Si, M.D.

## 2010-06-06 NOTE — H&P (Signed)
[name], [name]                ACCOUNT NO.:  [id number]   MEDICAL RECORD NO.:  [id number]          PATIENT TYPE:  OBV   LOCATION:  5527                         FACILITY:  [facility]   PHYSICIAN:  Marinda Elk, M.D.DATE OF BIRTH:  1922-12-23   DATE OF ADMISSION:  09/21/2008  DATE OF DISCHARGE:                              HISTORY & PHYSICAL   She is an 77 year old with past medical history of nonobstructive CAD  and hypertension, who comes in for chest pain.  This chest pain was  sharp in nature.  It is intermittent lasting 5-10 minutes and it is  retrosternal.  She relates no radiation with this pain.  She relates  nothing makes it better or worse.  This is nonexertional.  She relates  some shortness of breath with this chest pain, which she could not  determine whether the shortness of breath is happening with the chest  pain.  She relates no sweating, no pleuritic chest pain, no nausea, and  no vomiting.   ALLERGIES:  No known drug allergies.   PAST MEDICAL HISTORY:  1. CAD status post cath in May 2008 with moderate obstruction to the      ostial right.  With a cath in May 2008, that revealed right      coronary artery unchanged from 2005 with mild irregularities      proximally and the other vessels with 40% proximal intermediate      stenosis.  2. Hypertension.  3. Recent left wrist fracture.  4. Anemia.  5. Dementia.  6. Pulmonary nodule on the right upper lobe, 5 mm.  7. 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  8. Status post hemorrhoidectomy.   MEDICATIONS:  She is on:  1. Nitroglycerin 0.4 mg sublingual q.5 minutes p.r.n.  2. Diovan 160 mg p.o. daily.  3. Caduet 10/40 mg daily.  4. Toprol 50 mg daily.  5. Lasix 20 mg daily.  6. Pentoxifylline 400 mg p.o. t.i.d.  7. Detrol LA 4 mg p.o. daily.  8. Aspirin 81 mg daily.   SOCIAL HISTORY:  She denies any current tobacco use.  She used to smoke,  quit 30 years ago, used to smoke one pack  per day.  She denies any  alcohol abuse.  She lives in [place] here by herself.   FAMILY HISTORY:  Her father died of pneumonia at the age of 25.  Her  mother died at the age of 53 of an acute MI.  She had no MIs before the  age of 17.   PHYSICAL EXAMINATION:  VITAL SIGNS:  Temperature 99.0, respirations of  20, heart rate of 72, blood pressure 146/44.  She is sating 97% on room  air.  GENERAL APPEARANCE:  She appeared in no acute distress.  CARDIOVASCULAR:  She has a regular rate and rhythm with a positive S1  and S2.  No gallops.  LUNGS:  Clear to auscultation with good air movement.  ABDOMEN:  She has positive bowel sounds, nontender, nondistended, and  soft.  EXTREMITIES:  She has multiple bruises on her left arm and  legs from her  recent falls.  NEUROLOGIC:  She is alert and oriented.  She has coherent  and fluent language.  Her cranial nerves are grossly intact.  Sensation  is intact throughout.  She is able to move all 4 extremities  symmetrically with no deficit.  Finger-to-nose normal.   LABORATORY DATA:  Her CK is 2379.  Point-of-cardiac care markers her CK-  MB is 43.8 and her myoglobin is greater than 500.  Her troponin is less  than 0.05.  Her UA showed 3+ large amounts of blood, 0-3 white blood  cells, a small bilirubin, and small ketones.  Her white count is 11.0,  hemoglobin 10.8, platelets 178, ANC 9.3, MCV 85.6.  Sodium 137,  potassium 3.6, chloride 103, bicarb 25, BUN of 20, creatinine 0.85,  glucose of 102, calcium of 9.2.  EKG shows a normal sinus rhythm with  premature atrial complexes.  It also shows an inferior MI with left  ventricular hypertrophy, and nonspecific ST-segment changes.  CT scan of  the chest with contrast shows that the mediastinal lymphadenopathy has  resolved.  There is distinct density posteriorly in the right upper lobe  that persists.  There is a chance this could be representative of small  focus of bronchioloalveolar carcinoma.   Recommend a followup CT scan in  1 year.  She has a right posterior hemidiaphragm eventration.   ASSESSMENT AND PLAN:  1. Chest pain.  She has known cardiac disease with two risk factors.      Due to the fact that the patient has dementia, I do not think      history is reliable.  She does relate she is confused at times and      she has memory lapses.  So, we will admit to telemetry, check      cardiac enzymes q. 8 hours x3, check an EKG, check a TSH, check a      B12.  Also, start her on aspirin and statin.  Nitroglycerin p.r.n.      Morphine for pain.  Her elevated cardiac enzymes could be secondary      to her frequent falls that she is having.  2. Falls.  She relates frequent falls.  We will check orthostatics.      She does not relate any dizziness upon standing or any dizziness at      all.  So, we will start IV fluids and we will be gentle as she has      a little bit of diastolic dysfunction as per echo.  We will check a      CT scan of the head.  We will admit to telemetry to monitor for any      kind of arrhythmia as she does have frequent falls.  She also has      premature atrial complexes on EKG.  With these episodes, she      relates no loss of consciousness.  With these falls, she relates no      loss of consciousness.  She she relates that she trips or slips,      but due to her dementia, I do not think that she is reliable.  3. Increased in creatinine kinase.  This is most likely due to      frequent become falls and the fact that she spent 3 hours in the      floor yesterday.  We will continue to monitor her CK.  We will  hydrate gently as she does have does have a little bit of diastolic      dysfunction.  She was given contrast with an elevated CK.  This      could cause her to increase her creatinine, so we will continue to      monitor her creatinine daily.  4. Anemia.  We will continue to monitor hemoglobin.  The patient is 77      years old status post  hemicolectomy.  She said her PCP has been      following her up for her anemia.  I do not think she will benefit      from any more screening as there will be 3-year screening      colonoscopy as per her age and her comorbidities because she will      most likely not be a candidate for major surgery.  So, we will      defer further workup per her PCP.  We will check an anemia panel.  5. Leukocytosis is most likely secondary to recent falls or could be      stress demargination secondary to an acute coronary syndrome.  So,      we will continue to monitor leukocytosis.  She has no fever.  We      will not start her on antibiotic at this time.  6. Dementia.  This probably has been worked up in the past.  She is on      medications for it.  We will check a TSH and a B12 level for a      basic workup.  She relates that she continues to have frequent      memory loss.  7. Prophylaxis.  We will start her on heparin prophylactically and      Protonix daily.  8. Microscopic hematuria.  This is most likely secondary to      myoglobinuria.  Her urine microscopy showed 0-2 red blood cells.      Marinda Elk, M.D.  Electronically Signed     AF/MEDQ  D:  09/21/2008  T:  09/22/2008  Job:  [id number]

## 2010-06-06 NOTE — Consult Note (Signed)
[name], [name]                ACCOUNT NO.:  [id number]   MEDICAL RECORD NO.:  [id number]          PATIENT TYPE:  INP   LOCATION:  1410                         FACILITY[facility]  [facility]   PHYSICIAN:  Corky Crafts, MDDATE OF BIRTH:  10-01-1922   DATE OF CONSULTATION:  DATE OF DISCHARGE:                                 CONSULTATION   CONSULTATION:   PRIMARY CARDIOLOGIST:  Dr. Donnie Aho.   REASON FOR CONSULTATION:  Chest pain and shortness of breath.   HISTORY OF PRESENT ILLNESS:  The patient is an 77 year old with mild to  moderate coronary artery disease, admitted with chest pain and shortness  of breath along with a sore throat.  She had intermittent chest pain for  the past day.  She does not remember having a cath a few years ago.  She  does not remember when her last stress test was.  She reports congested  shortness of breath despite the fact she is lying flat and her O2 sats  have been reported in the mid 90's.  She has not required any oxygen.  Apparently, per the nurse, she has also been somewhat confused.  She has  been getting up and going to other patient rooms.  She has also been  asking about what time this place will be closing.   PAST MEDICAL HISTORY:  1. Hypertension.  2. Coronary artery disease with a previous cath back in 2005, showed      possibly at least moderately severe disease of the ostial right      coronary artery.  3. Colon cancer.   PAST SURGICAL HISTORY:  Colectomy.   SOCIAL HISTORY:  The patient stopped smoking 22 years ago.  Alcohol -  she stopped 20 years ago.  She does not use any illegal drugs.   ALLERGIES:  No known drug allergies.   MEDICATIONS AT HOME:  Diovan, Nav[employer], Caduet, metoprolol.   FAMILY HISTORY:  Mother had an enlarged heart.  Father unknown.   REVIEW OF SYSTEMS:  Pertinent for chest pain and shortness of breath.  All other systems negative.   PHYSICAL EXAMINATION:  Blood pressure 124/50, pulse 84, respiratory 16,  94%  on room air.  GENERAL:  The patient is awake and alert, in no apparent distress.  HEAD:  Normocephalic, atraumatic.  EYES:  Extraocular movements are intact.  NECK:  No carotid bruits.  CARDIOVASCULAR:  Regular rate and rhythm.  S1, S2.  LUNGS:  Clear to auscultation anteriorly.  ABDOMEN:  Soft, nontender, nondistended.  EXTREMITIES:  Shows no edema.   Troponin 0.01.  repeat value 0.05.   ECG shows normal sinus rhythm, left axis deviation, inferior Q-waves,  poor R wave progression.  No acute ST wave changes.   ASSESSMENT/PLAN:  An 77 year old with high blood pressure, chest pain  and a mild to moderate coronary artery disease.   PLAN:  1. Chest pain with moderately severe disease at the right coronary      ostia by previous cath 2005.  Will continue to rule out a MI with      enzymes.  There have been  no acute ECG changes.  2. Will keep the patient NPO for a possible cath or stress test      tomorrow.  The stress test may be held to determine the      significance of the ostial right coronary artery lesion.  3. Continue aspirin, Lopressor and Lovenox, will also add Zocor 20      mg/day.  4. The patient is somewhat confused as well.  This may lead to a more      conservative approach.  Her Lovenox was covering only CD-2      prophylactic dose.  I would not increase now because patient is      getting out of bed appropriately.  I will follow and reconsider if      the patient's enzymes increase.  5. Dr. Donnie Aho to follow in the morning.      Corky Crafts, MD  Electronically Signed     JSV/MEDQ  D:  06/02/2006  T:  06/03/2006  Job:  [id number]

## 2010-06-09 NOTE — Cardiovascular Report (Signed)
NAME:  [name], [name]                          ACCOUNT NO.:  [id number]   MEDICAL RECORD NO.:  [id number]                   PATIENT TYPE:  OIB   LOCATION:  2899                                 FACILITY:  [facility]   PHYSICIAN:  Darden Palmer., M.D.         DATE OF BIRTH:  11-05-1922   DATE OF PROCEDURE:  02/18/2003  DATE OF DISCHARGE:  02/18/2003                              CARDIAC CATHETERIZATION   PROCEDURE:  Cardiac catheterization.   CARDIOLOGIST:  Georga Hacking, M.D.   INDICATIONS:  [age]-year-old female who has a history of chest  discomfort and dyspnea and had an abnormal Cardiolite scan with Adenosine.  Study is done to exclude cardiac atherosclerotic disease.   COMMENTS:  The patient tolerated the procedure well without complications.  She had significant catheter damping of the right coronary ostium associated  with calcification that did not change with intracoronary nitroglycerin.  Although a severe stenosis could not be demonstrated angiographically, the  catheter damping remained, and there was felt to be ostial involvement of  the right coronary artery of uncertain degree.  She tolerated the procedure  well.   HEMODYNAMIC DATA:  1. Aorta post contrast 138/50.  2. LV post contrast 138/9-20.   ANGIOGRAPHIC DATA:  LEFT VENTRICULOGRAM:  Performed in the 30 degree RAO  projection.  The aortic valve appears normal.  The mitral valve is normal.  The left ventricle appears normal in size.  Estimated ejection is 60-65%.   CORONARY ARTERIOGRAPHY:  Coronary arteries arise and distribute normally.   The left main coronary artery is normal.   Left anterior descending:  There is segmental 40% narrowing in the proximal  left anterior descending.  The distal vessel appears to be have only  scattered irregularities.  A high first marginal or intermediate branch has  a 40% proximal narrowing.   The circumflex contains mild irregularity but no significant  stenoses.   The right coronary artery has heavy calcification noted in the aortic cusp.  There is catheter damping associated with this.  There appears to be at  least moderate involvement of the ostia of the right coronary artery.  I  could not demonstrate a severe focal stenosis angiographically.  The  remainder of the vessel contained only scattered irregularities and did not  reverse with nitroglycerin.   IMPRESSION:  1. Coronary artery disease with calcified ostium of the right coronary     artery that has at least moderately severe     disease.  2. Mild to moderate disease involving the proximal left anterior descending     and circumflex.  3. Normal left ventricular function.                                               Darden Palmer., M.D.  WST/MEDQ  D:  02/18/2003  T:  02/19/2003  Job:  [id number]   cc:   Barry Dienes. Eloise Harman, M.D.  [address]  [place]  [place] [address]  Fax: [phone]

## 2010-06-09 NOTE — Procedures (Signed)
[facility]  Patient:    [name], [name] Visit Number: [id number] MRN: [id number]          Service Type: END Location: ENDO Attending Physician:  Mervin Hack Dictated by:   Hedwig Morton. Juanda Chance, M.D. [facility] Admit Date:  11/01/2000   CC:         Sheppard Plumber. Earlene Plater, M.D.   Procedure Report  NAME OF PROCEDURES: 1. Upper endoscopy. 2. Esophageal dilatation.  INDICATIONS:  This 77 year old white female has complained of progressive solid food dysphagia.  She underwent upper endoscopy and esophageal dilatation for the same symptoms in October of 1995 with complete relief of dysphagia. At that time, no definite stricture was seen, but the patient had complete relief of the dysphagia.  She had about a distal esophageal ring about 1 cm proximal from the GE junction.  The patient denies symptoms of gastroesophageal reflux.  She is now undergoing upper endoscopy and attempt at esophageal dilatation for suspected esophageal stricture.  ENDOSCOPE:  Olympus single-channel endoscope.  SEDATION:  Versed 5 mg IV and Demerol 30 mg IV.  FINDINGS:  The Olympus single-channel endoscope was passed through the posterior pharynx into the esophagus.  The patient was monitored by pulse oximetry.  Oxygen saturations were normal.  There was no significant esophageal stricture.  Esophageal peristalsis was present.  There was no dilatation of the esophagus.  There was no retained food.  The squamocolumnar junction appeared normal.  The endoscope passed easily through the lower esophageal sphincter.  There was no resistance.  Stomach:  The stomach was insufflated with air and showed normal gastric folds with pyloric outlet, as well as duodenum and duodenal bulb.  A guide wire was then placed into the stomach and under fluoroscopic guidance, Savary dilators were passed through using 18, 19, and 20 mm dilators.  There was blood tinge on the second dilator and more blood on  the third dilator.  The patient tolerated the procedure well.  IMPRESSION:  Solid food dysphagia, status post dilatation to 20 mm.  PLAN:  The patient will be observed for response to the dilatation.  If her dysphagia has been relieved as last time, she will continue on the suppressing agent and no further work-up will be necessary.  If the dysphagia continues, we will evaluate her esophageal mobility disorder. Dictated by:   Hedwig Morton. Juanda Chance, M.D. [facility] Attending Physician:  Mervin Hack DD:  11/01/00 TD:  11/01/00 Job: [id number]

## 2010-06-09 NOTE — Assessment & Plan Note (Signed)
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  She has had numerous  colonoscopies, last one 2 years ago, September 2005, which did not show any  recurrence of her CA.  She now has had change in the bowel habits,  constipation, and rectal bleeding with almost every bowel movement.  She  dates onset of this to when she had a barium swallow because of dysphagia.  She has a known presbyesophagus and esophageal motility disorder documented  on upper endoscopy in 2004.  Her barium swallow recently confirmed motility  disorder but no evidence of stricture.   MEDICATIONS:  1. Lipitor 40 mg p.o. daily.  2. Diovan HCT 80/12.5 daily.  3. Detrol 40 mg p.o. daily.   1. Evista 60 mg p.o. daily.  2. Furosemide 20 mg p.o. daily.  3. Metoprolol 50 mg p.o. daily.  4. Aspirin.  5. Multiple vitamins.  6. Calcium supplements.   PHYSICAL EXAM:  VITAL SIGNS:  Blood pressure 138/46, pulse 68, weight 137  pounds.  GENERAL:  She was alert, oriented, and in no distress.  HEENT:  Sclerae are nonicteric.  LUNGS:  Clear to auscultation.  COR:  Normal S1, S2.  ABDOMEN:  Soft, nontender, normoactive bowel sounds, no distention.  The  liver edge at the costal margin.  A well-healed surgical scar below the  umbilicus.  RECTAL:  Anoscopic exam shows normal perianal area with decreased rectal  tone, edematous anal canal with some hemorrhoid like folds, probably first-  grade hemorrhoids with some hyperemia and contact bleeding of the surface of  these hemorrhoids.  Rectal ampullae itself was normal.  Stool was Heme-  positive.   IMPRESSION:  1. An 77 year old white  female with a history of cecal carcinoma 21 years      ago with now recurrence of rectal bleeding which, on anoscopy seems to      be of anal origin but since the stool is Hemoccult positive, I cannot      be completely sure of that.  Because of the change of the bowel habits,      we will go ahead with colonoscopic examination.  I think it is unlikely      that she would have recurrent cancer after 21 years but she is quite      concerned about it, especially because she is so constipated.  2. Presbyesophagus.  The patient needs to be careful about chewing her      food, elevate the head of the bed at night.  At this point, esophageal      dilatation would not help.     Hedwig Morton. Juanda Chance, MD  Electronically Signed    DMB/MedQ  DD: 11/22/2005  DT: 11/22/2005  Job #: [id number]   cc:   Lenon Curt. Chilton Si, M.D.

## 2010-06-09 NOTE — Op Note (Signed)
NAME:  [name], [name]                          ACCOUNT NO.:  [id number]   MEDICAL RECORD NO.:  [id number]                   PATIENT TYPE:  AMB   LOCATION:  ENDO                                 FACILITY:  [facility]   PHYSICIAN:  [name], M.D. [facility]               DATE OF BIRTH:  09/23/1922   DATE OF PROCEDURE:  DATE OF DISCHARGE:                                 OPERATIVE REPORT   PROCEDURE:  Upper endoscopy and esophageal dilation.   INDICATIONS FOR PROCEDURE:  This 78 year old white female with a history of  cecal carcinoma in 1986 has developed solid food dysphagia. She also has  complaint of some dysphagia to liquids. Two years ago in October 2002, she  underwent upper endoscopy and dilatation of the esophagus with large  dilators with complete relief of her dysphagia. She has recently been  complaining of regurgitation of secretions at night and having to get up at  night and spit up a lot of saliva. She also has had problems with solids as  well as liquids when eating. She is undergoing upper endoscopy and  esophageal dilatation as well as evaluation for possible esophageal  stricture.   ENDOSCOPE:  Olympus single channel video endoscope.   SEDATION:  Versed 5 mg IV, Demerol 25 mg IV.   FINDINGS:  The Olympus single channel video endoscope passed under direct  vision through the posterior pharynx into the esophagus. The patient was  monitored by pulse oximeter. Her oxygen saturations were normal. Her blood  pressure was somewhat high at 179/90. The vallecula and pyriform sinuses  were normal. The proximal esophagus was somewhat tortuous with large  muscular folds. No diverticula. There was a small amount of secretions and  mucous retained in the proximal esophagus. It was not clear whether the  peristalsis was propulsive but the endoscope passed easily through the  esophagus into the stomach. There was no evidence of stricture and no hiatal  hernia. The mucosa appeared  essentially normal.   STOMACH:  The stomach was insufflated and showed normal rugal pattern, a few  scattered mucosal hemorrhages in the gastric antrum, normal appearing  gastric outlet. Retroflexion of the endoscope revealed a tiny hiatal hernia  which was partially reducible.   DUODENUM:  The duodenal bulb appeared somewhat inflamed with some patchy  erythema but no erosions. The endoscope was then withdrawn back into the  stomach, guidewire was placed into the stomach and endoscope was retracted.  Savary dilators 18 mm passed over the guidewire with the tapered edge first  and advanced through the esophagus blindly without fluoroscopic guidance.  There was no particular resistance and no blood on the dilator. The patient  tolerated the procedure well.   IMPRESSION:  1. No evidence of esophageal stricture status post passage of an 18 mm     dilator.  2. Suspect esophageal motility disorder.  3. Gastritis status post CLOtest.  PLAN:  1. Esophogram to assist esophageal motility.  2.     Antireflux measures including head of the bed elevation at night.  3. Reglan 10 mg at bedtime.  4. Followup in 4-6 weeks.                                               [name], M.D. [facility]    DB/MEDQ  D:  09/22/2002  T:  09/22/2002  Job:  [id number]   cc:   Barry Dienes. Eloise Harman, M.D.  [address]  [place]  [place] [address]  Fax: [phone]

## 2010-06-09 NOTE — Assessment & Plan Note (Signed)
[facility]                         GASTROENTEROLOGY OFFICE NOTE   LARENA, OHNEMUS                       MRN:          [id number]  DATE:02/05/2006                            DOB:          [birth year]    Ms. Majewski is an 77 year old white female who has a history of  adenocarcinoma of the cecum resected with right hemicolectomy in 1986.  She has had many subsequent colonoscopies.  She also had a  hemorrhoidectomy x2 in the 1980s.  She has presbyesophagus.  Has a  history of H. pylori gastropathy treated.  Last colonoscopy was done in  September of 2005.  She has been having intermittent rectal bleeding.  Her CEA level is 1.1, and on her anoscopic exam last time, she had some  internal hemorrhoids.  Today, she comes and I thought we scheduled her  for colonoscopy, but she never had a colonoscopy this time, and again is  complaining of rectal bleeding.   PHYSICAL EXAM:  Blood pressure 120/72, pulse 80, and weight 138 pounds.  ABDOMEN:  Unremarkable.  On rectal and anoscopic exam, perianal area is normal.  Rectal tone is  somewhat decreased.  There are internal hemorrhoids, which are  erythematous, hyperemic, and bleed on contact.  Stool itself today was  Hemoccult negative.   An 77 year old white female with a history of colon cancer and rectal  bleeding, which is clearly of anal origin.  She was unable to take  Anusol [facility] suppositories because she claims it gives her diarrhea, so we  are going to switch her to Proctofoam nightly.  I would like her to come  back in 6 to 8 weeks and if she is unimproved, consider banding of the  hemorrhoids.     Hedwig Morton. Juanda Chance, MD  Electronically Signed    DMB/MedQ  DD: 02/05/2006  DT: 02/05/2006  Job #: [id number]   cc:   Lenon Curt. Chilton Si, M.D.

## 2010-09-04 ENCOUNTER — Emergency Department (HOSPITAL_COMMUNITY): Payer: PRIVATE HEALTH INSURANCE

## 2010-09-04 ENCOUNTER — Emergency Department (HOSPITAL_COMMUNITY)
Admission: EM | Admit: 2010-09-04 | Discharge: 2010-09-04 | Disposition: A | Discharge status: Home / Self Care | Payer: PRIVATE HEALTH INSURANCE | Attending: Emergency Medicine | Admitting: Emergency Medicine

## 2010-09-04 ENCOUNTER — Encounter (HOSPITAL_COMMUNITY): Payer: Self-pay

## 2010-09-04 DIAGNOSIS — G309 Alzheimer's disease, unspecified: Secondary | ICD-10-CM | POA: Insufficient documentation

## 2010-09-04 DIAGNOSIS — S0180XA Unspecified open wound of other part of head, initial encounter: Secondary | ICD-10-CM | POA: Insufficient documentation

## 2010-09-04 DIAGNOSIS — Z23 Encounter for immunization: Secondary | ICD-10-CM | POA: Insufficient documentation

## 2010-09-04 DIAGNOSIS — W1809XA Striking against other object with subsequent fall, initial encounter: Secondary | ICD-10-CM | POA: Insufficient documentation

## 2010-09-04 DIAGNOSIS — S0990XA Unspecified injury of head, initial encounter: Secondary | ICD-10-CM | POA: Insufficient documentation

## 2010-09-04 DIAGNOSIS — F0280 Dementia in other diseases classified elsewhere without behavioral disturbance: Secondary | ICD-10-CM | POA: Insufficient documentation

## 2010-09-04 DIAGNOSIS — Y9210 Unspecified residential institution as the place of occurrence of the external cause: Secondary | ICD-10-CM | POA: Insufficient documentation

## 2010-09-04 DIAGNOSIS — R5381 Other malaise: Secondary | ICD-10-CM | POA: Insufficient documentation

## 2010-09-04 DIAGNOSIS — R5383 Other fatigue: Secondary | ICD-10-CM | POA: Insufficient documentation

## 2010-09-04 DIAGNOSIS — I10 Essential (primary) hypertension: Secondary | ICD-10-CM | POA: Insufficient documentation

## 2010-09-04 LAB — DIFFERENTIAL
Basophils Relative: 0 % (ref 0–1)
Eosinophils Absolute: 0.1 10*3/uL (ref 0.0–0.7)
Eosinophils Relative: 2 % (ref 0–5)
Monocytes Relative: 9 % (ref 3–12)
Neutrophils Relative %: 64 % (ref 43–77)

## 2010-09-04 LAB — CBC
MCH: 28.8 pg (ref 26.0–34.0)
Platelets: 234 10*3/uL (ref 150–400)
RBC: 3.65 MIL/uL — ABNORMAL LOW (ref 3.87–5.11)
RDW: 13.6 % (ref 11.5–15.5)
WBC: 8.4 10*3/uL (ref 4.0–10.5)

## 2010-09-04 LAB — POCT I-STAT, CHEM 8
HCT: 31 % — ABNORMAL LOW (ref 36.0–46.0)
Hemoglobin: 10.5 g/dL — ABNORMAL LOW (ref 12.0–15.0)
Potassium: 3.6 mEq/L (ref 3.5–5.1)
Sodium: 142 mEq/L (ref 135–145)
TCO2: 24 mmol/L (ref 0–100)

## 2010-09-04 LAB — PROTIME-INR: Prothrombin Time: 13.5 seconds (ref 11.6–15.2)

## 2010-09-04 LAB — SAMPLE TO BLOOD BANK

## 2010-09-07 ENCOUNTER — Emergency Department (HOSPITAL_COMMUNITY)
Admission: EM | Admit: 2010-09-07 | Discharge: 2010-09-07 | Disposition: A | Discharge status: Home / Self Care | Payer: PRIVATE HEALTH INSURANCE | Attending: Emergency Medicine | Admitting: Emergency Medicine

## 2010-09-07 DIAGNOSIS — G309 Alzheimer's disease, unspecified: Secondary | ICD-10-CM | POA: Insufficient documentation

## 2010-09-07 DIAGNOSIS — Y9210 Unspecified residential institution as the place of occurrence of the external cause: Secondary | ICD-10-CM | POA: Insufficient documentation

## 2010-09-07 DIAGNOSIS — I2510 Atherosclerotic heart disease of native coronary artery without angina pectoris: Secondary | ICD-10-CM | POA: Insufficient documentation

## 2010-09-07 DIAGNOSIS — F0280 Dementia in other diseases classified elsewhere without behavioral disturbance: Secondary | ICD-10-CM | POA: Insufficient documentation

## 2010-09-07 DIAGNOSIS — IMO0002 Reserved for concepts with insufficient information to code with codable children: Secondary | ICD-10-CM | POA: Insufficient documentation

## 2010-09-07 DIAGNOSIS — W19XXXA Unspecified fall, initial encounter: Secondary | ICD-10-CM | POA: Insufficient documentation

## 2010-09-07 DIAGNOSIS — I10 Essential (primary) hypertension: Secondary | ICD-10-CM | POA: Insufficient documentation

## 2010-10-31 LAB — CREATININE, SERUM: GFR calc Af Amer: >60

## 2011-08-01 ENCOUNTER — Telehealth: Payer: Self-pay | Admitting: Internal Medicine

## 2011-08-01 NOTE — Telephone Encounter (Signed)
Pt c/o difficulty swallowing. Spoke with Gigi Gin and let her know the pts healthcare POA would need to come with her to the appt. Gigi Gin states [place]A [place]. Per Darcey Nora RN someone that is knowledgeable regarding the pts care and symptoms may accompany the pt to the appt from the nursing home. Peggy aware and pt scheduled for 08/07/11@11am  with Willette Cluster NP.

## 2011-08-06 ENCOUNTER — Encounter: Payer: Self-pay | Admitting: *Deleted

## 2011-08-07 ENCOUNTER — Encounter: Payer: Self-pay | Admitting: Nurse Practitioner

## 2011-08-07 ENCOUNTER — Ambulatory Visit (INDEPENDENT_AMBULATORY_CARE_PROVIDER_SITE_OTHER): Payer: Medicare Other | Admitting: Nurse Practitioner

## 2011-08-07 VITALS — BP 148/52 | HR 76 | Ht 58.5 in | Wt 149.0 lb

## 2011-08-07 DIAGNOSIS — R1310 Dysphagia, unspecified: Secondary | ICD-10-CM

## 2011-08-07 DIAGNOSIS — K219 Gastro-esophageal reflux disease without esophagitis: Secondary | ICD-10-CM | POA: Insufficient documentation

## 2011-08-07 HISTORY — DX: Dysphagia, unspecified: R13.10

## 2011-08-07 MED ORDER — SUCRALFATE 1 GM/10ML PO SUSP: ORAL | Status: DC

## 2011-08-07 MED ORDER — OMEPRAZOLE MAGNESIUM 20 MG PO TBEC: DELAYED_RELEASE_TABLET | ORAL | Status: DC

## 2011-08-07 NOTE — Patient Instructions (Addendum)
If you do not start feeling better and swallowing better within 10 days please call us at [phone].  We have given you a prescription for the Carafate slurry. Samples also of Prilosec OTC 20 mg, take 1 capsuel before breakfast and dinner.

## 2011-08-07 NOTE — Progress Notes (Signed)
08/07/2011 [name] [id number] Oct 29, 1922   HISTORY OF PRESENT ILLNESS: Patient is an 78 year old female who was last seen in 2008 by Dr. Juanda Chance. She has a history of cecal cancer in 1986, status post right hemicolectomy. Her last colonoscopy was June 2008 with findings of mild sigmoid diverticulosis and a normal ileocolonic anastomosis. No recall colonoscopy planned secondary to advanced age.  Patient had done well from a swallowing standpoint until 3-4 weeks ago when she developed solid food dysphasia with. Patient also complains of feeling as though something is stuck in her throat at all times over the last few weeks. She admits to acid reflux, caregiver states patient never complains about that at home however. Her weight is stable, odynophagia not prohibited her from eating.  Patient has been on weekly Fosamax for a year or so. Caregiver tells me the Fosamax is given in the morning time with a full glass of water, patient doesn't lay down following ingestion of the medication.   Past Medical History  Diagnosis Date  . Diverticulosis of colon (without mention of hemorrhage) 06/24/2006  . Carcinoma of cecum 1992    History of  . Vitamin B12 deficiency   . Alzheimer's disease   . HTN (hypertension)   . Depression   . HLD (hyperlipidemia)   . Osteoporosis, unspecified   . PVD (peripheral vascular disease)   . CAD (coronary artery disease)   . GERD (gastroesophageal reflux disease)    Past Surgical History  Procedure Date  . Gallbladder surgery   . Abdominal hysterectomy Right hemicolectomy 1995    reports that she has quit smoking. She has never used smokeless tobacco. She reports that she does not drink alcohol or use illicit drugs. family history is not on file. No Known Allergies    No outpatient encounter prescriptions on file as of 08/07/2011.    REVIEW OF SYSTEMS  : positive for arthritis, anxiety, vision changes, confusion, cough, depression, headaches, night  sweats, shortness of breath and sore throat. All other systems reviewed and negative except where noted in the history of present illness to ll other systems reviewed and negative except where noted in the History of Present Illness.   PHYSICAL EXAM: BP 148/52  Pulse 76  Ht 4' 10.5" (1.486 m)  Wt 149 lb (67.586 kg)  BMI 30.61 kg/m2 General: Well developed white female in no acute distress Head: Normocephalic and atraumatic Eyes:  sclerae anicteric,conjunctive pink. Ears: Normal auditory acuity Mouth: No obvious Candida Neck: Supple, no masses.  Lungs: Clear throughout to auscultation Heart: Regular rate and rhythm Abdomen: Limited exam, patient did not get on the examination table. She has limited ability, ambulates with a walker. Soft, non distended, nontender. No masses or hepatomegaly noted. Normal Bowel sounds Rectal: not done Musculoskeletal: Symmetrical with no gross deformities  Skin: No lesions on visible extremities Extremities: No edema or deformities noted Neurological: Alert oriented x 4, grossly nonfocal Cervical Nodes:  No significant cervical adenopathy Psychological:  Alert and cooperative. Normal mood and affect  ASSESSMENT AND PLAN: 1. solid food dysphasia / odynophagia. Rule out esophagitis secondary to reflux and / or fosamax. Candida esophagitis not excluded. Will ask assisted-living facility to hold the Fosamax for the next several weeks. Patient will be given Carafate slurry 4 times a day as well as twice daily PPI. She will return to clinic in 4-5 weeks. I have asked her to call in a week or so for a sooner appointment if symptoms not improving.    2.  GERD manifested by reflux and globus, she will likely need longterm PPI therapy.  3. Remote history of colon cancer, s/p right hemicolectomy. No recurrence of cancer on colonoscopy 2008. Surveillance colonoscopy not planned secondary to advanced age.

## 2011-08-07 NOTE — Progress Notes (Deleted)
08/07/2011 [name] [id number] November 02, 1922   HISTORY OF PRESENT ILLNESS: ***   Past Medical History  Diagnosis Date  . Diverticulosis of colon (without mention of hemorrhage) 06/24/2006  . Carcinoma of cecum 1992    History of  . Vitamin B12 deficiency   . Alzheimer's disease   . HTN (hypertension)   . Depression   . HLD (hyperlipidemia)   . Osteoporosis, unspecified   . PVD (peripheral vascular disease)   . CAD (coronary artery disease)   . GERD (gastroesophageal reflux disease)    Past Surgical History  Procedure Date  . Gallbladder surgery   . Abdominal hysterectomy 1995    reports that she has quit smoking. She has never used smokeless tobacco. She reports that she does not drink alcohol or use illicit drugs. family history is not on file. No Known Allergies    No outpatient encounter prescriptions on file as of 08/07/2011.     REVIEW OF SYSTEMS  : All other systems reviewed and negative except where noted in the History of Present Illness.   PHYSICAL EXAM: BP 148/52  Pulse 76  Ht 4' 10.5" (1.486 m)  Wt 149 lb (67.586 kg)  BMI 30.61 kg/m2 General: Well developed white female in no acute distress Head: Normocephalic and atraumatic Eyes:  sclerae anicteric,conjunctive pink. Ears: Normal auditory acuity Mouth: No deformity or lesions Neck: Supple, no masses.  Lungs: Clear throughout to auscultation Heart: Regular rate and rhythm; no murmurs heard Abdomen: Soft, non distended, nontender. No masses or hepatomegaly noted. Normal Bowel sounds Rectal: *** Musculoskeletal: Symmetrical with no gross deformities  Skin: No lesions on visible extremities Extremities: No edema or deformities noted Neurological: Alert oriented x 4, grossly nonfocal Cervical Nodes:  No significant cervical adenopathy Psychological:  Alert and cooperative. Normal mood and affect  ASSESSMENT AND PLAN:

## 2011-08-08 NOTE — Progress Notes (Signed)
Agree with assessment and initial plans as outlined. I did notice in the chart the patient had a barium esophagram in 2007 suggesting dysmotility. At followup, swallowing difficulties continue, he will need further investigation. Either with endoscopy or a repeat barium esophagram.

## 2011-08-20 ENCOUNTER — Other Ambulatory Visit: Payer: Self-pay | Admitting: Nurse Practitioner

## 2011-08-29 ENCOUNTER — Encounter: Payer: Self-pay | Admitting: *Deleted

## 2011-09-11 ENCOUNTER — Encounter: Payer: Self-pay | Admitting: Internal Medicine

## 2011-09-11 ENCOUNTER — Ambulatory Visit (INDEPENDENT_AMBULATORY_CARE_PROVIDER_SITE_OTHER): Payer: Medicare Other | Admitting: Internal Medicine

## 2011-09-11 VITALS — BP 142/74 | HR 60 | Ht 58.5 in | Wt 149.0 lb

## 2011-09-11 DIAGNOSIS — K228 Other specified diseases of esophagus: Secondary | ICD-10-CM

## 2011-09-11 DIAGNOSIS — R1319 Other dysphagia: Secondary | ICD-10-CM

## 2011-09-11 NOTE — Progress Notes (Signed)
[name] 09-May-1922 MRN [id number]  History of Present Illness:  This is an 78 year old white female with hx of cecal carcinoma in 1986 at which time she underwent a right hemicolectomy. Her last colonoscopy was in 2008. Because of her age, a recall colonoscopy is not planned. She is here today because of dysphagia to solids and pills. She has a known history of esophageal dysmotility which was shown on a barium esophagram in 2007. A barium tablet past without delay. She was recently seen for dysphagia and was put on Carafate slurry. Her Fosamax was discontinued and she was started on a PPI. Her last upper endoscopy in 2004 showed no evidence of esophageal stricture. She had a tortuous esophagus which was dilated with 18 mm dilator with complete relief of her dysphagia. She denies cough or hoarseness when eating. Her weight has been stable.   Past Medical History  Diagnosis Date  . Diverticulosis of colon (without mention of hemorrhage) 06/24/2006  . Carcinoma of cecum 1992    History of  . Vitamin B12 deficiency   . Alzheimer's disease   . HTN (hypertension)   . Depression   . HLD (hyperlipidemia)   . Osteoporosis, unspecified   . PVD (peripheral vascular disease)   . CAD (coronary artery disease)   . GERD (gastroesophageal reflux disease)   . H. pylori infection 2004  . Presbyesophagus    Past Surgical History  Procedure Date  . Gallbladder surgery   . Abdominal hysterectomy 1995    with bso  . Right colectomy   . Cataract extraction 2001    reports that she has quit smoking. She has never used smokeless tobacco. She reports that she does not drink alcohol or use illicit drugs. family history includes Heart disease in her mother.  There is no history of Colon cancer. No Known Allergies      Review of Systems: Decreased minimally. Dysphagia to solids. Normal bowel habits  The remainder of the 10 point ROS is negative except as outlined in H&P   Physical Exam: General  appearance  Well developed, in no distress. She couldn't remember if she saw me before Eyes- non icteric. HEENT nontraumatic, normocephalic. Mouth no lesions, tongue papillated, no cheilosis. Neck supple without adenopathy, thyroid not enlarged, no carotid bruits, no JVD. Lungs Clear to auscultation bilaterally. Cor normal S1, normal S2, regular rhythm, no murmur,  quiet precordium. Abdomen: Protuberant,soft, nontender. Rectal: Not done. Extremities no pedal edema. Skin no lesions. Neurological alert and oriented x 3. Psychological normal mood and affect.  Assessment and Plan:  Problem #1 Chronic esophageal dysmotility consistent with presbyesophagus diagnosed in 2007 on a barium esophagram. Her symptoms are recurrent now. In the past, they were relieved by dilating the esophagus with large size dilators. We will repeat the same   We will switch her to a ground mechanical diet to facilitate passage of food and will also crush all medications and mix it with applesauce. So far, she doesn't seem to show any signs of aspiration. This has been discussed with the patient. We will also obtain permission from her son for the procedure. She has been able to sign her papers at the nursing home. The son however, has the power of attorney.  09/11/2011 [name]

## 2011-09-11 NOTE — Patient Instructions (Addendum)
Please crush medications and mix with applesauce. You should be on a ground mechanical diet. You have been scheduled for an endoscopy with propofol. Please follow written instructions given to you at your visit today. If you use inhalers (even only as needed), please bring them with you on the day of your procedure. We will contact your son, Dimas Aguas about your endoscopy. CC: Dr Sherilyn Cooter Trip

## 2011-09-12 ENCOUNTER — Telehealth: Payer: Self-pay | Admitting: *Deleted

## 2011-09-12 NOTE — Telephone Encounter (Signed)
I have spoken to Mr.[name], [place] for [facility] D[facility] (Mr. Brandvold lives out of state and cannot be here for the patient's procedure). I have gone over Ms. Mates scheduled endoscopy procedure prep in detail with him. He has verbalized understanding of all instructions. I have advised him that I will send the written instructions to him as well and have him sign acknowledgement that he has had the procedure, cancellation agreement, financial responsibility and [facility] facility information given to him and he will then send the acknowledgement back to our office. I have also explained that since he is POA and will not be able to come for the procedure, he must give Sonia Baller (patient's nurse that will be with her for procedure) written permission to act on his behalf day of procedure. He states that he will do this.

## 2011-09-17 ENCOUNTER — Other Ambulatory Visit: Payer: Self-pay | Admitting: Nurse Practitioner

## 2011-10-03 ENCOUNTER — Other Ambulatory Visit: Payer: Self-pay | Admitting: Nurse Practitioner

## 2011-10-24 ENCOUNTER — Encounter: Payer: Self-pay | Admitting: Internal Medicine

## 2011-10-24 ENCOUNTER — Ambulatory Visit (AMBULATORY_SURGERY_CENTER): Payer: Medicare Other | Admitting: Internal Medicine

## 2011-10-24 VITALS — BP 126/58 | HR 68 | Temp 97.8°F | Resp 18 | Ht 58.5 in | Wt 149.0 lb

## 2011-10-24 DIAGNOSIS — K2970 Gastritis, unspecified, without bleeding: Secondary | ICD-10-CM

## 2011-10-24 DIAGNOSIS — K2940 Chronic atrophic gastritis without bleeding: Secondary | ICD-10-CM

## 2011-10-24 DIAGNOSIS — R1319 Other dysphagia: Secondary | ICD-10-CM

## 2011-10-24 DIAGNOSIS — K2289 Other specified disease of esophagus: Secondary | ICD-10-CM

## 2011-10-24 DIAGNOSIS — K228 Other specified diseases of esophagus: Secondary | ICD-10-CM

## 2011-10-24 DIAGNOSIS — K222 Esophageal obstruction: Secondary | ICD-10-CM

## 2011-10-24 DIAGNOSIS — R1310 Dysphagia, unspecified: Secondary | ICD-10-CM

## 2011-10-24 MED ORDER — SODIUM CHLORIDE 0.9 % IV SOLN: 500.0000 mL | INTRAVENOUS | Status: DC

## 2011-10-24 NOTE — Progress Notes (Signed)
Patient did not experience any of the following events: a burn prior to discharge; a fall within the facility; wrong site/side/patient/procedure/implant event; or a hospital transfer or hospital admission upon discharge from the facility. (G8907) Patient did not have preoperative order for IV antibiotic SSI prophylaxis. (G8918)  

## 2011-10-24 NOTE — Patient Instructions (Addendum)
Impressions/Recommendations:  Stricture Gastritis  Post dilation diet given. Await pathology results. Follow antireflux regimen.  YOU HAD AN ENDOSCOPIC PROCEDURE TODAY AT THE [facility]: Refer to the procedure report that was given to you for any specific questions about what was found during the examination.  If the procedure report does not answer your questions, please call your gastroenterologist to clarify.  If you requested that your care partner not be given the details of your procedure findings, then the procedure report has been included in a sealed envelope for you to review at your convenience later.  YOU SHOULD EXPECT: Some feelings of bloating in the abdomen. Passage of more gas than usual.  Walking can help get rid of the air that was put into your GI tract during the procedure and reduce the bloating. If you had a lower endoscopy (such as a colonoscopy or flexible sigmoidoscopy) you may notice spotting of blood in your stool or on the toilet paper. If you underwent a bowel prep for your procedure, then you may not have a normal bowel movement for a few days.  DIET: Your first meal following the procedure should be a light meal and then it is ok to progress to your normal diet.  A half-sandwich or bowl of soup is an example of a good first meal.  Heavy or fried foods are harder to digest and may make you feel nauseous or bloated.  Likewise meals heavy in dairy and vegetables can cause extra gas to form and this can also increase the bloating.  Drink plenty of fluids but you should avoid alcoholic beverages for 24 hours.  ACTIVITY: Your care partner should take you home directly after the procedure.  You should plan to take it easy, moving slowly for the rest of the day.  You can resume normal activity the day after the procedure however you should NOT DRIVE or use heavy machinery for 24 hours (because of the sedation medicines used during the test).    SYMPTOMS TO REPORT  IMMEDIATELY: A gastroenterologist can be reached at any hour.  During normal business hours, 8:30 AM to 5:00 PM Monday through Friday, call [phone].  After hours and on weekends, please call the GI answering service at [phone] who will take a message and have the physician on call contact you.    Following upper endoscopy (EGD)  Vomiting of blood or coffee ground material  New chest pain or pain under the shoulder blades  Painful or persistently difficult swallowing  New shortness of breath  Fever of 100F or higher  Black, tarry-looking stools  FOLLOW UP: If any biopsies were taken you will be contacted by phone or by letter within the next 1-3 weeks.  Call your gastroenterologist if you have not heard about the biopsies in 3 weeks.  Our staff will call the home number listed on your records the next business day following your procedure to check on you and address any questions or concerns that you may have at that time regarding the information given to you following your procedure. This is a courtesy call and so if there is no answer at the home number and we have not heard from you through the emergency physician on call, we will assume that you have returned to your regular daily activities without incident.  SIGNATURES/CONFIDENTIALITY: You and/or your care partner have signed paperwork which will be entered into your electronic medical record.  These signatures attest to the fact that that  the information above on your After Visit Summary has been reviewed and is understood.  Full responsibility of the confidentiality of this discharge information lies with you and/or your care-partner.

## 2011-10-24 NOTE — Op Note (Signed)
[facility] [address] [place], [address]   ENDOSCOPY PROCEDURE REPORT  PATIENT: [name], [name]  MR#: [id number] BIRTHDATE: 04/10/1922 , 89  yrs. old GENDER: Female ENDOSCOPIST: Hart Carwin, MD REFERRED BY:  [name], M.D. PROCEDURE DATE:  10/24/2011 PROCEDURE:  EGD w/ biopsy and Savary dilation of esophagus ASA CLASS:     Class III INDICATIONS:  dysphagia.   dysphagia to solids and pills, known esoph.  dismotility from 2007 Barium swallow, EGD 2004 with 18 mm dilator relieved dysphagia. MEDICATIONS: MAC sedation, administered by CRNA and Propofol (Diprivan) 130 mg IV TOPICAL ANESTHETIC: none  DESCRIPTION OF PROCEDURE: After the risks benefits and alternatives of the procedure were thoroughly explained, informed consent was obtained.  The LB-GIF Q180 [id number] endoscope was introduced through the mouth and advanced to the second portion of the duodenum. Without limitations.  The instrument was slowly withdrawn as the mucosa was fully examined.      Presbyesophagus, tortuosity, mild nonobstructing stricture at 30 cm, 2 cm hiatal hernia, partially reducible.  Stricture dilated with 16 mm Savary dilated which passed over a guidewire.  STOMACH: Moderate gastritis (inflammation) was found in the gastric antrum.  A biopsy was performed using cold forceps.  Sample obtained for helicobacter pylori testing.  Retroflexed views revealed no abnormalities.     The scope was then withdrawn from the patient and the procedure completed.  COMPLICATIONS: There were no complications. ENDOSCOPIC IMPRESSION: 1.   Presbyesophagus, tortuosity, mild nonobstructing stricture at 30 cm, 2 cm hiatal hernia, partially reducible.  Stricture dilated with 16 mm Savary dilator which passed over a guidewire , there was no blood on the dilator 2.   Gastritis (inflammation) was found in the gastric antrum; biopsy  RECOMMENDATIONS: 1.  Await pathology results 2.  anti-reflux  regimen to be follow 3.  she is on ground diet, watch for aspiration/cough with meals  REPEAT EXAM: for No recall due to age.Marland Kitchen  eSigned:  Hart Carwin, MD 10/24/2011 11:42 AM   CC:  PATIENT NAME:  [name], [name] MR#: [id number]

## 2011-10-25 ENCOUNTER — Telehealth: Payer: Self-pay | Admitting: *Deleted

## 2011-10-25 NOTE — Telephone Encounter (Signed)
Pt. Not available for phone call.  Spoke with 2nd floor staff at [facility].  I asked staff member if she was aware if Ms. Vanliew was doing well after procedure yesterday.  She stated that in report This morning she was advised that residents were all doing well.  I asked that she call us if Ms. Raczynski was in any distress, had questions or concerns.  She stated that she would do this.

## 2011-10-26 ENCOUNTER — Other Ambulatory Visit: Payer: Self-pay | Admitting: Nurse Practitioner

## 2011-10-29 ENCOUNTER — Encounter: Payer: Self-pay | Admitting: Internal Medicine

## 2011-12-24 ENCOUNTER — Inpatient Hospital Stay (HOSPITAL_COMMUNITY)
Admission: EM | Admit: 2011-12-24 | Discharge: 2011-12-28 | DRG: 149 | Disposition: A | Discharge status: Skilled Nursing Facility | Payer: Medicare Other | Attending: Internal Medicine | Admitting: Internal Medicine

## 2011-12-24 ENCOUNTER — Observation Stay (HOSPITAL_COMMUNITY): Payer: Medicare Other

## 2011-12-24 ENCOUNTER — Encounter (HOSPITAL_COMMUNITY): Payer: Self-pay | Admitting: *Deleted

## 2011-12-24 ENCOUNTER — Emergency Department (HOSPITAL_COMMUNITY): Payer: Medicare Other

## 2011-12-24 DIAGNOSIS — R627 Adult failure to thrive: Secondary | ICD-10-CM | POA: Diagnosis present

## 2011-12-24 DIAGNOSIS — Z79899 Other long term (current) drug therapy: Secondary | ICD-10-CM

## 2011-12-24 DIAGNOSIS — I739 Peripheral vascular disease, unspecified: Secondary | ICD-10-CM | POA: Diagnosis present

## 2011-12-24 DIAGNOSIS — E876 Hypokalemia: Secondary | ICD-10-CM | POA: Diagnosis present

## 2011-12-24 DIAGNOSIS — E785 Hyperlipidemia, unspecified: Secondary | ICD-10-CM | POA: Diagnosis present

## 2011-12-24 DIAGNOSIS — G459 Transient cerebral ischemic attack, unspecified: Secondary | ICD-10-CM

## 2011-12-24 DIAGNOSIS — G309 Alzheimer's disease, unspecified: Secondary | ICD-10-CM | POA: Diagnosis present

## 2011-12-24 DIAGNOSIS — F0280 Dementia in other diseases classified elsewhere without behavioral disturbance: Secondary | ICD-10-CM | POA: Diagnosis present

## 2011-12-24 DIAGNOSIS — I2510 Atherosclerotic heart disease of native coronary artery without angina pectoris: Secondary | ICD-10-CM | POA: Diagnosis present

## 2011-12-24 DIAGNOSIS — F02818 Dementia in other diseases classified elsewhere, unspecified severity, with other behavioral disturbance: Secondary | ICD-10-CM | POA: Diagnosis present

## 2011-12-24 DIAGNOSIS — F0281 Dementia in other diseases classified elsewhere with behavioral disturbance: Secondary | ICD-10-CM | POA: Diagnosis present

## 2011-12-24 DIAGNOSIS — W19XXXA Unspecified fall, initial encounter: Secondary | ICD-10-CM

## 2011-12-24 DIAGNOSIS — K219 Gastro-esophageal reflux disease without esophagitis: Secondary | ICD-10-CM | POA: Diagnosis present

## 2011-12-24 DIAGNOSIS — R5381 Other malaise: Secondary | ICD-10-CM | POA: Diagnosis present

## 2011-12-24 DIAGNOSIS — Z9181 History of falling: Secondary | ICD-10-CM

## 2011-12-24 DIAGNOSIS — Z8719 Personal history of other diseases of the digestive system: Secondary | ICD-10-CM

## 2011-12-24 DIAGNOSIS — R1310 Dysphagia, unspecified: Secondary | ICD-10-CM

## 2011-12-24 DIAGNOSIS — I214 Non-ST elevation (NSTEMI) myocardial infarction: Secondary | ICD-10-CM

## 2011-12-24 DIAGNOSIS — R42 Dizziness and giddiness: Principal | ICD-10-CM | POA: Diagnosis present

## 2011-12-24 DIAGNOSIS — G9340 Encephalopathy, unspecified: Secondary | ICD-10-CM | POA: Diagnosis present

## 2011-12-24 DIAGNOSIS — I10 Essential (primary) hypertension: Secondary | ICD-10-CM | POA: Diagnosis present

## 2011-12-24 LAB — URINALYSIS, ROUTINE W REFLEX MICROSCOPIC
Glucose, UA: NEGATIVE mg/dL
Leukocytes, UA: NEGATIVE
Nitrite: NEGATIVE
Protein, ur: NEGATIVE mg/dL

## 2011-12-24 LAB — CBC WITH DIFFERENTIAL/PLATELET
Basophils Absolute: 0 10*3/uL (ref 0.0–0.1)
Eosinophils Absolute: 0 10*3/uL (ref 0.0–0.7)
Lymphocytes Relative: 10 % — ABNORMAL LOW (ref 12–46)
Lymphs Abs: 0.7 10*3/uL (ref 0.7–4.0)
Neutrophils Relative %: 76 % (ref 43–77)
Platelets: 175 10*3/uL (ref 150–400)
RBC: 4.18 MIL/uL (ref 3.87–5.11)
RDW: 13.3 % (ref 11.5–15.5)
WBC: 6.5 10*3/uL (ref 4.0–10.5)

## 2011-12-24 LAB — BASIC METABOLIC PANEL
CO2: 28 mEq/L (ref 19–32)
GFR calc non Af Amer: 73 mL/min — ABNORMAL LOW (ref >90)
Glucose, Bld: 84 mg/dL (ref 70–99)
Potassium: 3.1 mEq/L — ABNORMAL LOW (ref 3.5–5.1)
Sodium: 139 mEq/L (ref 135–145)

## 2011-12-24 LAB — RAPID URINE DRUG SCREEN, HOSP PERFORMED
Amphetamines: NOT DETECTED
Barbiturates: NOT DETECTED
Benzodiazepines: NOT DETECTED
Cocaine: NOT DETECTED
Opiates: NOT DETECTED
Tetrahydrocannabinol: NOT DETECTED

## 2011-12-24 LAB — TROPONIN I: Troponin I: 0.43 ng/mL (ref <0.30)

## 2011-12-24 MED ORDER — ONDANSETRON HCL 4 MG/2ML IJ SOLN: 4.0000 mg | Freq: Four times a day (QID) | INTRAMUSCULAR | Status: DC | PRN

## 2011-12-24 MED ORDER — DONEPEZIL HCL 10 MG PO TABS
10.0000 mg | ORAL_TABLET | Freq: Every day | ORAL | Status: DC
Start: 2011-12-24 — End: 2011-12-28
  Administered 2011-12-25 – 2011-12-27 (×4): 10 mg via ORAL
  Filled 2011-12-24 (×5): qty 1

## 2011-12-24 MED ORDER — ASPIRIN 325 MG PO TABS
325.0000 mg | ORAL_TABLET | Freq: Once | ORAL | Status: AC
  Administered 2011-12-24: 325 mg via ORAL
  Filled 2011-12-24: qty 1

## 2011-12-24 MED ORDER — ASPIRIN 81 MG PO TABS: 81.0000 mg | ORAL_TABLET | Freq: Every day | ORAL | Status: DC

## 2011-12-24 MED ORDER — LORAZEPAM 0.5 MG PO TABS
0.2500 mg | ORAL_TABLET | Freq: Three times a day (TID) | ORAL | Status: DC
  Administered 2011-12-25 – 2011-12-27 (×7): 0.25 mg via ORAL
  Filled 2011-12-24 (×7): qty 1

## 2011-12-24 MED ORDER — SODIUM CHLORIDE 0.9 % IJ SOLN
3.0000 mL | Freq: Two times a day (BID) | INTRAMUSCULAR | Status: DC
  Administered 2011-12-25 – 2011-12-28 (×8): 3 mL via INTRAVENOUS

## 2011-12-24 MED ORDER — DULOXETINE HCL 60 MG PO CPEP
60.0000 mg | ORAL_CAPSULE | Freq: Every day | ORAL | Status: DC
  Administered 2011-12-25 – 2011-12-28 (×4): 60 mg via ORAL
  Filled 2011-12-24 (×4): qty 1

## 2011-12-24 MED ORDER — IRBESARTAN 75 MG PO TABS
75.0000 mg | ORAL_TABLET | Freq: Every day | ORAL | Status: DC
  Administered 2011-12-25 – 2011-12-26 (×2): 75 mg via ORAL
  Filled 2011-12-24 (×3): qty 1

## 2011-12-24 MED ORDER — FERROUS SULFATE 325 (65 FE) MG PO TABS
325.0000 mg | ORAL_TABLET | Freq: Every day | ORAL | Status: DC
  Administered 2011-12-25 – 2011-12-28 (×4): 325 mg via ORAL
  Filled 2011-12-24 (×6): qty 1

## 2011-12-24 MED ORDER — HYDROCODONE-ACETAMINOPHEN 5-325 MG PO TABS: 1.0000 | ORAL_TABLET | ORAL | Status: DC | PRN

## 2011-12-24 MED ORDER — POTASSIUM CHLORIDE 10 MEQ/100ML IV SOLN
10.0000 meq | INTRAVENOUS | Status: AC
  Administered 2011-12-24 – 2011-12-25 (×4): 10 meq via INTRAVENOUS
  Filled 2011-12-24 (×3): qty 100

## 2011-12-24 MED ORDER — LORAZEPAM 2 MG/ML IJ SOLN
0.2500 mg | Freq: Once | INTRAMUSCULAR | Status: AC
  Administered 2011-12-24: 0.25 mg via INTRAVENOUS
  Filled 2011-12-24: qty 1

## 2011-12-24 MED ORDER — SODIUM CHLORIDE 0.9 % IV SOLN
INTRAVENOUS | Status: AC
  Administered 2011-12-24: 19:00:00 via INTRAVENOUS

## 2011-12-24 MED ORDER — ACETAMINOPHEN 325 MG PO TABS
650.0000 mg | ORAL_TABLET | Freq: Three times a day (TID) | ORAL | Status: DC
  Administered 2011-12-24 – 2011-12-28 (×11): 650 mg via ORAL
  Filled 2011-12-24 (×13): qty 2

## 2011-12-24 MED ORDER — SUCRALFATE 1 GM/10ML PO SUSP
1.0000 g | Freq: Three times a day (TID) | ORAL | Status: DC
  Administered 2011-12-25 – 2011-12-28 (×13): 1 g via ORAL
  Filled 2011-12-24 (×19): qty 10

## 2011-12-24 MED ORDER — DIVALPROEX SODIUM 125 MG PO CPSP
125.0000 mg | ORAL_CAPSULE | Freq: Two times a day (BID) | ORAL | Status: DC
Start: 2011-12-24 — End: 2011-12-28
  Administered 2011-12-25 – 2011-12-28 (×8): 125 mg via ORAL
  Filled 2011-12-24 (×9): qty 1

## 2011-12-24 MED ORDER — ASPIRIN 81 MG PO CHEW
81.0000 mg | CHEWABLE_TABLET | Freq: Every day | ORAL | Status: DC
  Administered 2011-12-25 – 2011-12-28 (×4): 81 mg via ORAL
  Filled 2011-12-24 (×4): qty 1

## 2011-12-24 MED ORDER — ONDANSETRON HCL 4 MG/2ML IJ SOLN: 4.0000 mg | Freq: Three times a day (TID) | INTRAMUSCULAR | Status: DC | PRN

## 2011-12-24 MED ORDER — CHOLECALCIFEROL 10 MCG (400 UNIT) PO TABS
400.0000 [IU] | ORAL_TABLET | Freq: Every day | ORAL | Status: DC
  Administered 2011-12-25 – 2011-12-28 (×4): 400 [IU] via ORAL
  Filled 2011-12-24 (×4): qty 1

## 2011-12-24 MED ORDER — ATORVASTATIN CALCIUM 10 MG PO TABS
5.0000 mg | ORAL_TABLET | Freq: Every day | ORAL | Status: DC
  Administered 2011-12-25 – 2011-12-27 (×4): 5 mg via ORAL
  Filled 2011-12-24 (×5): qty 0.5

## 2011-12-24 MED ORDER — ENOXAPARIN SODIUM 40 MG/0.4ML ~~LOC~~ SOLN
40.0000 mg | SUBCUTANEOUS | Status: DC
  Administered 2011-12-25 – 2011-12-27 (×4): 40 mg via SUBCUTANEOUS
  Filled 2011-12-24 (×5): qty 0.4

## 2011-12-24 MED ORDER — LISINOPRIL 10 MG PO TABS
10.0000 mg | ORAL_TABLET | Freq: Every day | ORAL | Status: DC
  Administered 2011-12-25 – 2011-12-28 (×4): 10 mg via ORAL
  Filled 2011-12-24 (×4): qty 1

## 2011-12-24 MED ORDER — ONDANSETRON HCL 4 MG PO TABS: 4.0000 mg | ORAL_TABLET | Freq: Four times a day (QID) | ORAL | Status: DC | PRN

## 2011-12-24 MED ORDER — HYDROCHLOROTHIAZIDE 12.5 MG PO CAPS
12.5000 mg | ORAL_CAPSULE | Freq: Every day | ORAL | Status: DC
  Administered 2011-12-25 – 2011-12-28 (×4): 12.5 mg via ORAL
  Filled 2011-12-24 (×4): qty 1

## 2011-12-24 NOTE — ED Notes (Signed)
Pt was unable to stand to take vitals

## 2011-12-24 NOTE — Progress Notes (Signed)
  Echocardiogram 2D Echocardiogram has been performed.  [name] 12/24/2011, 5:55 PM

## 2011-12-24 NOTE — H&P (Signed)
[facility] History and Physical  [name] JXB:[id number] DOB: 12-Dec-1922 DOA: 12/24/2011  Referring physician: ED physician PCP: [name], MD   Chief Complaint: dizziness, unsteady gait  HPI:  Pt is 78 yo female, resident of SNF who was brought to [facility] ED for further evaluation of dizziness and unsteady gait that was noted by nursing staff at the facility. Pt is unable to provide history due to advance dementia and confusion and this history was obtained from records and ED physician. Pt apparently had several falls earlier the day of admission and unsteady gait was noticed. Pt apparently able to ambulate with walker. It is unclear if pt had any chest pain, shortness of breath, abdominal or urinary concerns, any specific focal neurological weakness.  Assessment and Plan: Principal Problem:  *Dizziness - unclear etiology and possibly multifactorial, ? CVA, progressive failure to thrive and deconditioning  - will admit the pt to telemetry floor as troponin in ED elevated (please note that pt denies chest pain) - will proceed with CVA rule out: MRI brain, carotid doppler, 2 D ECHO, cycle CE's - pt will need PT/OT/SLP evaluation - will need to advance the diet as pt able to tolerate - continue Aspirin and stain for now, BP control  Active Problems:  GERD (gastroesophageal reflux disease) - stable - continue Protonix  HTN (hypertension) - elevated on admission but improved now - will continue to monitor vitals per floor protocol - readjust the regimen as indicated  Hypokalemia - will supplement - BMP in AM  Code Status: Full Family Communication: no family at bedside Disposition Plan: Admission to telemetry floor, PT/OT/SLP evaluation   Review of Systems:  Unable to provide due to advanced dementia  Past Medical History  Diagnosis Date  . Diverticulosis of colon (without mention of hemorrhage) 06/24/2006  . Carcinoma of cecum 1992    History of  . Vitamin B12  deficiency   . Alzheimer's disease   . HTN (hypertension)   . Depression   . HLD (hyperlipidemia)   . Osteoporosis, unspecified   . PVD (peripheral vascular disease)   . CAD (coronary artery disease)   . GERD (gastroesophageal reflux disease)   . H. pylori infection 2004  . Presbyesophagus     Past Surgical History  Procedure Date  . Gallbladder surgery   . Abdominal hysterectomy 1995    with bso  . Right colectomy   . Cataract extraction 2001    Social History:  reports that she has quit smoking. She has never used smokeless tobacco. She reports that she does not drink alcohol or use illicit drugs.  No Known Allergies  Family History  Problem Relation Age of Onset  . Heart disease Mother   . Colon cancer Neg Hx     Prior to Admission medications   Medication Sig Start Date End Date Taking? Authorizing Provider  acetaminophen (TYLENOL) 325 MG tablet Take 650 mg by mouth 3 (three) times daily.   Yes Historical Provider, MD  alendronate (FOSAMAX) 70 MG tablet Take 70 mg by mouth every 7 (seven) days. Take with a full glass of water on an empty stomach.   Yes Historical Provider, MD  aspirin 81 MG tablet Take 81 mg by mouth daily.   Yes Historical Provider, MD  atorvastatin (LIPITOR) 10 MG tablet Take 5 mg by mouth at bedtime.   Yes Historical Provider, MD  Calcium-Vitamin D 600-200 MG-UNIT per tablet Take 1 tablet by mouth daily.   Yes Historical Provider, MD  cholecalciferol (VITAMIN D) 400 UNITS TABS Take 400 Units by mouth daily.   Yes Historical Provider, MD  divalproex (DEPAKOTE SPRINKLE) 125 MG capsule Take 125 mg by mouth 2 (two) times daily.   Yes Historical Provider, MD  donepezil (ARICEPT) 10 MG tablet Take 10 mg by mouth at bedtime.   Yes Historical Provider, MD  DULoxetine (CYMBALTA) 60 MG capsule Take 60 mg by mouth daily.   Yes Historical Provider, MD  ferrous sulfate 325 (65 FE) MG tablet Take 325 mg by mouth daily with breakfast.   Yes Historical Provider, MD    hydrochlorothiazide (MICROZIDE) 12.5 MG capsule Take 12.5 mg by mouth daily.   Yes Historical Provider, MD  lisinopril (PRINIVIL,ZESTRIL) 10 MG tablet Take 10 mg by mouth daily.   Yes Historical Provider, MD  LORazepam (ATIVAN) 0.5 MG tablet Take 0.25 mg by mouth 3 (three) times daily.    Yes Historical Provider, MD  Multiple Vitamin (DAILY VITAMINS PO) Take 1 tablet by mouth daily.   Yes Historical Provider, MD  sucralfate (CARAFATE) 1 GM/10ML suspension Take 1 g by mouth 4 (four) times daily -  before meals and at bedtime.   Yes Historical Provider, MD  valsartan (DIOVAN) 320 MG tablet Take 320 mg by mouth daily.   Yes Historical Provider, MD  vitamin B-12 (CYANOCOBALAMIN) 1000 MCG tablet Take 1,000 mcg by mouth every other day.   Yes Historical Provider, MD    Physical Exam: Filed Vitals:   12/24/11 1251 12/24/11 1313 12/24/11 1529 12/24/11 1534  BP: 144/52  180/55 161/47  Pulse: 87  45 85  Temp: 97.9 F (36.6 C)     TempSrc: Oral     Resp: 16     SpO2: 97% 97%      Physical Exam  Constitutional: Appears stable and not in acute distress  HENT: Normocephalic. External right and left ear normal. Dry MM. Eyes: Conjunctivae normal. PERRLA, no scleral icterus.  Neck: Normal ROM. Neck supple. No JVD. No tracheal deviation. No thyromegaly.  CVS: RRR, S1/S2 +, no murmurs, no gallops, no carotid bruit.  Pulmonary: Effort and breath sounds normal, decreased breath sounds at bases Abdominal: Soft. BS +,  no distension, tenderness, rebound or guarding.  Musculoskeletal: No edema and no tenderness.  Lymphadenopathy: No lymphadenopathy noted, cervical, inguinal. Neuro: Alert, follows some commands appropriately, strength 4/5 in lower extremities Skin: Skin is warm and dry. No rash noted. Not diaphoretic. No erythema. No pallor.  Psychiatric: Normal mood and affect.   Labs on Admission:  Basic Metabolic Panel:  Lab 12/24/11 7829  NA 139  K 3.1*  CL 99  CO2 28  GLUCOSE 84  BUN 22   CREATININE 0.79  CALCIUM 10.0  MG --  PHOS --   CBC:  Lab 12/24/11 1545  WBC 6.5  NEUTROABS 4.9  HGB 12.1  HCT 36.1  MCV 86.4  PLT 175   Cardiac Enzymes:  Lab 12/24/11 1630  CKTOTAL --  CKMB --  CKMBINDEX --  TROPONINI 0.43*   Radiological Exams on Admission: Ct Head Wo Contrast  12/24/2011  *RADIOLOGY REPORT*  Clinical Data: Coronary artery disease.  Dizziness and nausea  CT HEAD WITHOUT CONTRAST  Technique:  Contiguous axial images were obtained from the base of the skull through the vertex without contrast.  Comparison: 09/04/2010  Findings: There is diffuse patchy low density throughout the subcortical and periventricular white matter consistent with chronic small vessel ischemic change.  There is prominence of the sulci and ventricles consistent with  brain atrophy.  There is no evidence for acute brain infarct, hemorrhage or mass.  A right frontal scalp hematoma is identified.  No underlying skull fracture identified.  IMPRESSION:  1.  Small vessel ischemic disease and brain atrophy. 2.  No acute intracranial abnormalities noted.   Original Report Authenticated By: Signa Kell, M.D.     EKG: Normal sinus rhythm, no ST/T wave changes  Debbora Presto, MD  [facility] Pager [phone]  If 7PM-7AM, please contact night-coverage [email or web address] Password [facility] 12/24/2011, 5:47 PM

## 2011-12-24 NOTE — ED Provider Notes (Addendum)
History     CSN: [id number]  Arrival date & time 12/24/11  1240   First MD Initiated Contact with Patient 12/24/11 1354      Chief Complaint  Patient presents with  . Dizziness  . Nausea    (Consider location/radiation/quality/duration/timing/severity/associated sxs/prior treatment) HPI Comments: Level 5 caveat for dementia.  Pt comes in from nursing home with cc of unstable gait and nausea, dizziness. Pt has severe dementia, and she is unsure why she is in the ER.  I spoke with the nursing home, and they stated that patient is able to ambulate with the walker, however today, she was extremely unsteady, and fell 3 times. Pt also reported of nausea and dizziness to the nursing facility. Pt has no complains at this time, and denies any nausea, dizziness - but does endorse to a headache.  The history is provided by medical records.    Past Medical History  Diagnosis Date  . Diverticulosis of colon (without mention of hemorrhage) 06/24/2006  . Carcinoma of cecum 1992    History of  . Vitamin B12 deficiency   . Alzheimer's disease   . HTN (hypertension)   . Depression   . HLD (hyperlipidemia)   . Osteoporosis, unspecified   . PVD (peripheral vascular disease)   . CAD (coronary artery disease)   . GERD (gastroesophageal reflux disease)   . H. pylori infection 2004  . Presbyesophagus     Past Surgical History  Procedure Date  . Gallbladder surgery   . Abdominal hysterectomy 1995    with bso  . Right colectomy   . Cataract extraction 2001    Family History  Problem Relation Age of Onset  . Heart disease Mother   . Colon cancer Neg Hx     History  Substance Use Topics  . Smoking status: Former Games developer  . Smokeless tobacco: Never Used  . Alcohol Use: No    OB History    Grav Para Term Preterm Abortions TAB SAB Ect Mult Living                  Review of Systems  Unable to perform ROS: Dementia  Neurological: Positive for dizziness, light-headedness and  headaches. Negative for syncope, speech difficulty and weakness.    Allergies  Review of patient's allergies indicates no known allergies.  Home Medications   Current Outpatient Rx  Name  Route  Sig  Dispense  Refill  . ACETAMINOPHEN 325 MG PO TABS   Oral   Take 650 mg by mouth 3 (three) times daily.         . ALENDRONATE SODIUM 70 MG PO TABS   Oral   Take 70 mg by mouth every 7 (seven) days. Take with a full glass of water on an empty stomach.         . ASPIRIN 81 MG PO TABS   Oral   Take 81 mg by mouth daily.         . ATORVASTATIN CALCIUM 10 MG PO TABS   Oral   Take 5 mg by mouth at bedtime.         Marland Kitchen CALCIUM-VITAMIN D 600-200 MG-UNIT PO TABS   Oral   Take 1 tablet by mouth daily.         . CHOLECALCIFEROL 400 UNITS PO TABS   Oral   Take 400 Units by mouth daily.         Marland Kitchen DIVALPROEX SODIUM 125 MG PO CPSP   Oral  Take 125 mg by mouth 2 (two) times daily.         . DONEPEZIL HCL 10 MG PO TABS   Oral   Take 10 mg by mouth at bedtime.         . DULOXETINE HCL 60 MG PO CPEP   Oral   Take 60 mg by mouth daily.         Marland Kitchen FERROUS SULFATE 325 (65 FE) MG PO TABS   Oral   Take 325 mg by mouth daily with breakfast.         . HYDROCHLOROTHIAZIDE 12.5 MG PO CAPS   Oral   Take 12.5 mg by mouth daily.         Marland Kitchen LISINOPRIL 10 MG PO TABS   Oral   Take 10 mg by mouth daily.         Marland Kitchen LORAZEPAM 0.5 MG PO TABS   Oral   Take 0.25 mg by mouth 3 (three) times daily.          Marland Kitchen DAILY VITAMINS PO   Oral   Take 1 tablet by mouth daily.         . SUCRALFATE 1 GM/10ML PO SUSP   Oral   Take 1 g by mouth 4 (four) times daily -  before meals and at bedtime.         Marland Kitchen VALSARTAN 320 MG PO TABS   Oral   Take 320 mg by mouth daily.         Marland Kitchen VITAMIN B-12 1000 MCG PO TABS   Oral   Take 1,000 mcg by mouth every other day.           BP 161/47  Pulse 85  Temp 97.9 F (36.6 C) (Oral)  Resp 16  SpO2 97%  Physical Exam  Nursing note  and vitals reviewed. Constitutional: She appears well-developed and well-nourished.  HENT:  Head: Normocephalic and atraumatic.  Eyes: EOM are normal. Pupils are equal, round, and reactive to light.  Neck: Neck supple.  Cardiovascular: Normal rate, regular rhythm and normal heart sounds.   No murmur heard. Pulmonary/Chest: Effort normal. No respiratory distress.  Abdominal: Soft. She exhibits no distension. There is no tenderness. There is no rebound and no guarding.  Neurological: She is alert. No cranial nerve deficit. Coordination normal.       Cerebellar exam is normal (finger to nose) Sensory exam normal for bilateral upper and lower extremities - and patient is able to discriminate between sharp and dull. Motor exam is 4+/5  Skin: Skin is warm and dry.    ED Course  Procedures (including critical care time)  Labs Reviewed  CBC WITH DIFFERENTIAL - Abnormal; Notable for the following:    Lymphocytes Relative 10 (*)     Monocytes Relative 14 (*)     All other components within normal limits  BASIC METABOLIC PANEL - Abnormal; Notable for the following:    Potassium 3.1 (*)     GFR calc non Af Amer 73 (*)     GFR calc Af Amer 84 (*)     All other components within normal limits  TROPONIN I  URINALYSIS, ROUTINE W REFLEX MICROSCOPIC  URINE CULTURE   Ct Head Wo Contrast  12/24/2011  *RADIOLOGY REPORT*  Clinical Data: Coronary artery disease.  Dizziness and nausea  CT HEAD WITHOUT CONTRAST  Technique:  Contiguous axial images were obtained from the base of the skull through the vertex without contrast.  Comparison: 09/04/2010  Findings: There is diffuse patchy low density throughout the subcortical and periventricular white matter consistent with chronic small vessel ischemic change.  There is prominence of the sulci and ventricles consistent with brain atrophy.  There is no evidence for acute brain infarct, hemorrhage or mass.  A right frontal scalp hematoma is identified.  No  underlying skull fracture identified.  IMPRESSION:  1.  Small vessel ischemic disease and brain atrophy. 2.  No acute intracranial abnormalities noted.   Original Report Authenticated By: Signa Kell, M.D.      No diagnosis found.    MDM  DDx includes:  Stroke - ischemic vs. hemorrhagic TIA Neuropathy Orthostatic  Pt comes in with cc of new onset dizziness, nausea, and falls with gait instability. Last normal was yday. Her current exam is normal, including cerebellar, finger to nose. Good candidate for TIA protocol to look for acute infarct if the CT head is negative for hemorrhage of subacute cerebellar infarct.   Derwood Kaplan, MD 12/24/11 1653   Date: 12/24/2011  Rate: 83  Rhythm: normal sinus rhythm  QRS Axis: left  Intervals: normal  ST/T Wave abnormalities: nonspecific ST/T changes  Conduction Disutrbances:none  Narrative Interpretation:   Old EKG Reviewed: unchanged  ABCD2 SCORE = 4 (age, BP, symptoms duration)  Derwood Kaplan, MD 12/24/11 1654  5:32 PM Pt's troponin is 0.43. Will admit to [facility]. Transfer cancelled.  Derwood Kaplan, MD 12/24/11 1733   Date: 12/24/2011  Rate: 83  Rhythm: normal sinus rhythm  QRS Axis: normal  Intervals: normal  ST/T Wave abnormalities: non specific st and t wave changes  Conduction Disutrbances: none  Narrative Interpretation: unremarkable  No heparin due to falls. ASA given.      Derwood Kaplan, MD 12/24/11 1759

## 2011-12-24 NOTE — Progress Notes (Addendum)
CSW met with pt at bedside, pt was trying to slide out of the bed. CSW redirected patient. Pt stated, "I'm trying to run out of here." Pt agreed to stay and wait for the doctor to exam patient. CSW asked patient where she lives, Pt stated that she lives here. Pt is only alert to self at this time. Per chart review, patient is from [facility].   CSW spoke with [facility] who stated that patient sometimes gets confused. CSW was informed that patient emergency contact is pt son Ensley Blas phone number is [phone]. CSW left message for patient son.   Catha Gosselin, LCSWA  [phone] .12/24/2011 1640pm

## 2011-12-24 NOTE — ED Notes (Signed)
Per ems: pt from [facility], c/o nausea, dizziness, lightheadedness x3 weeks. Denies emesis. NSR on monitor. bp 136/84, pulse 86, saO2 97% on ra. Hx of multiple falls

## 2011-12-25 ENCOUNTER — Observation Stay (HOSPITAL_COMMUNITY): Payer: Medicare Other

## 2011-12-25 DIAGNOSIS — G459 Transient cerebral ischemic attack, unspecified: Secondary | ICD-10-CM

## 2011-12-25 DIAGNOSIS — R42 Dizziness and giddiness: Secondary | ICD-10-CM

## 2011-12-25 LAB — CBC
HCT: 34.6 % — ABNORMAL LOW (ref 36.0–46.0)
MCHC: 33.2 g/dL (ref 30.0–36.0)
MCV: 86.7 fL (ref 78.0–100.0)
RDW: 13.3 % (ref 11.5–15.5)

## 2011-12-25 LAB — BASIC METABOLIC PANEL
BUN: 21 mg/dL (ref 6–23)
CO2: 27 mEq/L (ref 19–32)
Chloride: 102 mEq/L (ref 96–112)
Creatinine, Ser: 0.67 mg/dL (ref 0.50–1.10)

## 2011-12-25 LAB — TSH: TSH: 2.766 u[IU]/mL (ref 0.350–4.500)

## 2011-12-25 LAB — MRSA PCR SCREENING: MRSA by PCR: NEGATIVE

## 2011-12-25 NOTE — Progress Notes (Addendum)
Patient ID: [name], female   DOB: Feb 11, 1922, 78 y.o.   MRN: [id number]  [facility] PROGRESS NOTE  [name] JYN:[id number] DOB: 1922-04-15 DOA: 12/24/2011 PCP: [name], MD  Brief narrative: Pt is 78 yo female, resident of ALF who was brought to [facility] ED for further evaluation of dizziness and unsteady gait that was noted by nursing staff at the facility. Pt is unable to provide history due to advance dementia and confusion and this history was obtained from records and ED physician. Pt apparently had several falls earlier the day of admission and unsteady gait was noticed. Pt apparently able to ambulate with walker. It is unclear if pt had any chest pain, shortness of breath, abdominal or urinary concerns, any specific focal neurological weakness.   Assessment and Plan:  Principal Problem:  *Dizziness  - unclear etiology and possibly multifactorial, ? TUA, progressive failure to thrive and deconditioning, dementia  - MRI ruled out an acute CVA but old infarct is noted, please see below - 2 D ECHO unremarkable as well carotid doppler - electrolytes stable this AM - PT evaluation done and recommendation is for SNF placement - SW consult for assistance with discharge  - continue Aspirin and stain for now, BP control  Active Problems:  Elevated troponins - likely demand ischemia - cardiology on call recommended supportive care - troponins trending down and pt denies chest pain this AM, no shortness of breath  GERD (gastroesophageal reflux disease)  - stable  - continue Protonix  HTN (hypertension)  - elevated on admission but improved now  - will continue to monitor vitals per floor protocol  - readjust the regimen as indicated  Hypokalemia  - will supplement today - BMP in AM  Consultants:  PT/OT/SLP  Procedures/Studies: 12/02: Carotid Doppler --> No significant extracranial carotid artery stenosis demonstrated. Vertebrals patent with antegrade flow. 12/02:  2D ECHO --> EF 65% with grade I diastolic dysfunction  12/02: Ct Head Wo Contrast --> Small vessel ischemic disease and brain atrophy. No acute intracranial abnormalities noted.    12/03: Mr Maxine Glenn Head Wo Contrast --> No acute infarct.  Prominent small vessel disease. 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  Global atrophy.   12/03: MRA HEAD --> Motion degraded exam. ntracranial atherosclerotic type changes.  4 x 3 mm left posterior communicating artery aneurysm may be present.     Antibiotics:  None  Code Status: Full Family Communication: No family at bedside, call family member at [phone] and was told to call son at [phone] who lives in [place], I tried several times but no answer and I was not able to leave message. Will ask SW to assist with placement to SNF. Ready for discharge when bed available.   HPI/Subjective: No events overnight. Pt denies chest pain or shortness of breath this AM.  Objective: Filed Vitals:   12/24/11 2104 12/25/11 0548 12/25/11 0603 12/25/11 1150  BP: 154/39  169/91 155/88  Pulse: 67  85   Temp: 98 F (36.7 C)  97.3 F (36.3 C)   TempSrc: Oral  Oral   Resp: 18  16   Height:      Weight:  64.6 kg (142 lb 6.7 oz)    SpO2: 92%  97%     Intake/Output Summary (Last 24 hours) at 12/25/11 1321 Last data filed at 12/25/11 0700  Gross per 24 hour  Intake   1850 ml  Output    425 ml  Net  1425 ml    Exam:   General:  Pt is alert, follows some commands appropriately, not in acute distress  Cardiovascular: Regular rate and rhythm, S1/S2, no murmurs, no rubs, no gallops  Respiratory: Clear to auscultation bilaterally, no wheezing, no crackles, no rhonchi  Abdomen: Soft, non tender, non distended, bowel sounds present, no guarding  Extremities: No edema, pulses DP and PT palpable bilaterally  Neuro: Grossly non focal, alert and oriented to name only  Data Reviewed: Basic Metabolic Panel:  Lab 12/25/11 4540  12/24/11 1545  NA 140 139  K 3.2* 3.1*  CL 102 99  CO2 27 28  GLUCOSE 77 84  BUN 21 22  CREATININE 0.67 0.79  CALCIUM 9.2 10.0  MG -- 2.3  PHOS -- 3.7   CBC:  Lab 12/25/11 0358 12/24/11 1545  WBC 5.2 6.5  NEUTROABS -- 4.9  HGB 11.5* 12.1  HCT 34.6* 36.1  MCV 86.7 86.4  PLT 158 175   Cardiac Enzymes:  Lab 12/25/11 0358 12/24/11 2211 12/24/11 1630  CKTOTAL -- -- --  CKMB -- -- --  CKMBINDEX -- -- --  TROPONINI 0.31* 0.32* 0.43*    Recent Results (from the past 240 hour(s))  MRSA PCR SCREENING     Status: Normal   Collection Time   12/24/11 11:45 PM      Component Value Range Status Comment   MRSA by PCR NEGATIVE  NEGATIVE Final      Scheduled Meds:   . acetaminophen  650 mg Oral TID  . aspirin  81 mg Oral Daily  . atorvastatin  5 mg Oral QHS  . cholecalciferol  400 Units Oral Daily  . divalproex  125 mg Oral BID  . donepezil  10 mg Oral QHS  . DULoxetine  60 mg Oral Daily  . enoxaparin  injection  40 mg Subcutaneous Q24H  . ferrous sulfate  325 mg Oral Q breakfast  . hydrochlorothiazide  12.5 mg Oral Daily  . irbesartan  75 mg Oral Daily  . lisinopril  10 mg Oral Daily  . LORazepam  0.25 mg Oral TID  . potassium chloride  10 mEq Intravenous Q1 Hr x 4  . sucralfate  1 g Oral TID AC & HS   Continuous Infusions:    Debbora Presto, MD  [facility] Pager [phone]  If 7PM-7AM, please contact night-coverage [email or web address] Password [email or web address] 12/25/2011, 1:21 PM   LOS: 1 day

## 2011-12-25 NOTE — Evaluation (Signed)
Occupational Therapy Evaluation Patient Details Name: SHAYANNA THATCH MRN: [id number] DOB: 1922/05/25 Today's Date: 12/25/2011 Time: 8119-1478 OT Time Calculation (min): 28 min  OT Assessment / Plan / Recommendation Clinical Impression  78 y.o. female admitted with falls and dizziness. At baseline, per son in law, pt was independent with a RW and with ADLs. Pt now is unsteady in sitting, requiring max assist to maintain balance. Skilled OT indicated to maximize independence with BADLs to decrease burden of care at next venue.    OT Assessment  Patient needs continued OT Services    Follow Up Recommendations  SNF    Barriers to Discharge Decreased caregiver support    Equipment Recommendations  None recommended by OT    Recommendations for Other Services    Frequency  Min 2X/week    Precautions / Restrictions Precautions Precautions: Fall Restrictions Weight Bearing Restrictions: No   Pertinent Vitals/Pain Pt did not appear to be in pain or distress.    ADL  Grooming: Simulated;Moderate assistance Where Assessed - Grooming: Unsupported sitting Upper Body Bathing: Simulated;Moderate assistance Where Assessed - Upper Body Bathing: Unsupported sitting Lower Body Bathing: Simulated;+2 Total assistance Lower Body Bathing: Patient Percentage: 0% Where Assessed - Lower Body Bathing: Supported sit to stand Upper Body Dressing: Simulated;Moderate assistance Where Assessed - Upper Body Dressing: Unsupported sitting Lower Body Dressing: Simulated;+2 Total assistance Lower Body Dressing: Patient Percentage: 0% Where Assessed - Lower Body Dressing: Supported sit to stand Toilet Transfer: Simulated;+2 Total assistance Toilet Transfer: Patient Percentage: 50% Toilet Transfer Method: Sit to stand;Other (comment) (recliner.) Toileting - Clothing Manipulation and Hygiene: Simulated;+2 Total assistance Toileting - Clothing Manipulation and Hygiene: Patient Percentage: 0% Where Assessed -  Toileting Clothing Manipulation and Hygiene: Sit to stand from 3-in-1 or toilet Equipment Used: Rolling walker;Gait belt Transfers/Ambulation Related to ADLs: Pt ambulated several feet in the hallway with a severe R sided lean and max cues for safety. ADL Comments: Pt requires max steadying assist while sitting unsupported EOB for any ADL activity. Pt's dynamic sit balance very poor. During MMT, pt had a complete R lateral LOB which she was unable to self correct.    OT Diagnosis: Generalized weakness  OT Problem List: Decreased activity tolerance;Decreased safety awareness;Decreased cognition;Decreased strength;Decreased knowledge of use of DME or AE;Impaired balance (sitting and/or standing) OT Treatment Interventions: Self-care/ADL training;Therapeutic activities;DME and/or AE instruction;Patient/family education;Balance training   OT Goals Acute Rehab OT Goals OT Goal Formulation: Patient unable to participate in goal setting Time For Goal Achievement: 01/08/12 Potential to Achieve Goals: Fair ADL Goals Pt Will Perform Grooming: with set-up;Sitting, edge of bed;Sitting, chair;Unsupported ADL Goal: Grooming - Progress: Goal set today Pt Will Transfer to Toilet: with min assist;Stand pivot transfer;Ambulation;with DME ADL Goal: Toilet Transfer - Progress: Goal set today Additional ADL Goal #1: Pt will complete supine>sit with min A as an ADL precurser ADL Goal: Additional Goal #1 - Progress: Goal set today Additional ADL Goal #2: Pt will tolerate sitting EOB with SBA x 8 min without LOB in prep for seated ADL. ADL Goal: Additional Goal #2 - Progress: Goal set today  Visit Information  Last OT Received On: 12/25/11 Assistance Needed: +2 PT/OT Co-Evaluation/Treatment: Yes    Subjective Data  Subjective: I'm from [place]. Patient Stated Goal: Pt did not state.   Prior Functioning     Home Living Lives With:  (From [facility]) Available Help at Discharge: Skilled Nursing  Facility Type of Home: Assisted living Home Adaptive Equipment: Walker - rolling Prior Function Level of  Independence: Independent with assistive device(s) Driving: No Comments: Info provided per family. Pt is a poor historian. Communication Communication: Expressive difficulties (initally pt had some word finding difficulty, then improved)         Vision/Perception Vision - Assessment Additional Comments: Unable to assess. Pt unable to follow necessary commands.   Cognition  Overall Cognitive Status: History of cognitive impairments - at baseline Arousal/Alertness: Awake/alert Orientation Level: Time Behavior During Session: [facility] for tasks performed Cognition - Other Comments: Pt answered "yes" for each question asked. Very slow to process info and commands.    Extremity/Trunk Assessment Right Upper Extremity Assessment RUE ROM/Strength/Tone: Deficits RUE ROM/Strength/Tone Deficits: Pt's active shoulder flexion to ~60* Unable to hold against gravity. 3/5 strength at best elbow and distally. Left Upper Extremity Assessment LUE ROM/Strength/Tone: Deficits LUE ROM/Strength/Tone Deficits: Pt's active shoulder flexion to ~60* Unable to hold against gravity. 3/5 strength at best elbow and distally. Right Lower Extremity Assessment RLE ROM/Strength/Tone: WFL for tasks assessed RLE Sensation: WFL - Light Touch RLE Coordination: WFL - gross/fine motor Left Lower Extremity Assessment LLE ROM/Strength/Tone: WFL for tasks assessed LLE Sensation: WFL - Light Touch LLE Coordination: WFL - gross/fine motor Trunk Assessment Trunk Assessment: Kyphotic     Mobility Bed Mobility Bed Mobility: Rolling Left;Left Sidelying to Sit Rolling Left: 3: Mod assist Left Sidelying to Sit: 1: +2 Total assist Left Sidelying to Sit: Patient Percentage: 20% Details for Bed Mobility Assistance: assist to elevate trunk Transfers Sit to Stand: 1: +2 Total assist Sit to Stand: Patient Percentage:  50% Stand to Sit: 1: +2 Total assist Stand to Sit: Patient Percentage: 30% Details for Transfer Assistance: assist to stabilize for balance and safety, pt attempted to sit prior to reaching chair     Shoulder Instructions     Exercise     Balance Balance Balance Assessed: Yes Static Sitting Balance Static Sitting - Balance Support: Left upper extremity supported;Feet supported Static Sitting - Level of Assistance: 2: Max assist;4: Min assist Static Sitting - Comment/# of Minutes: While sitting on EOB Pt had R lateral LOB  x 3 requiring max assist to correct, at times able to maintain upright position with BUE support   End of Session OT - End of Session Equipment Utilized During Treatment: Gait belt Activity Tolerance: Patient tolerated treatment well Patient left: in chair;with call bell/phone within reach  GO     Vilma Will A OTR/L [phone] 12/25/2011, 12:12 PM

## 2011-12-25 NOTE — Evaluation (Signed)
Physical Therapy Evaluation Patient Details Name: [name] MRN: [id number] DOB: 1922/06/14 Today's Date: 12/25/2011 Time: 4098-1191 PT Time Calculation (min): 28 min  PT Assessment / Plan / Recommendation Clinical Impression  78 y.o. female admitted with falls and dizziness. At baseline, per son in law, pt was independent with a RW and with ADLs. Pt now is unsteady in sitting, requiring max assist to maintain balance. +2 assist to ambulate 30' with RW. Pt at high risk for falls. ST-SNF recommended.  Pt would benefit from acute PT to maximize safety and independence with mobility.     PT Assessment  Patient needs continued PT services    Follow Up Recommendations  SNF    Does the patient have the potential to tolerate intense rehabilitation      Barriers to Discharge        Equipment Recommendations  None recommended by PT    Recommendations for Other Services OT consult   Frequency Min 3X/week    Precautions / Restrictions Precautions Precautions: Fall Restrictions Weight Bearing Restrictions: No   Pertinent Vitals/Pain **pt denies pain*      Mobility  Bed Mobility Bed Mobility: Rolling Left;Left Sidelying to Sit Rolling Left: 3: Mod assist Left Sidelying to Sit: 1: +2 Total assist Left Sidelying to Sit: Patient Percentage: 20% Details for Bed Mobility Assistance: assist to elevate trunk Transfers Transfers: Sit to Stand;Stand to Sit Sit to Stand: 1: +2 Total assist Sit to Stand: Patient Percentage: 50% Stand to Sit: 1: +2 Total assist Stand to Sit: Patient Percentage: 30% Details for Transfer Assistance: assist to stabilize for balance and safety, pt attempted to sit prior to reaching chair Ambulation/Gait Ambulation/Gait Assistance: 1: +2 Total assist Ambulation/Gait: Patient Percentage: 60% Ambulation Distance (Feet): 30 Feet Assistive device: Rolling walker Gait Pattern: Narrow base of support;Trunk flexed;Decreased step length - right;Decreased step  length - left General Gait Details: assist for balance, VCs to correct flexed neck posture, to increase step length, for positioning in RW (walks too far behind it), and to widen BOS    Shoulder Instructions     Exercises     PT Diagnosis: Difficulty walking;Abnormality of gait;Generalized weakness  PT Problem List: Decreased activity tolerance;Decreased balance;Decreased mobility;Decreased safety awareness;Decreased cognition PT Treatment Interventions: Functional mobility training;Therapeutic activities;Patient/family education;Gait training;DME instruction   PT Goals Acute Rehab PT Goals PT Goal Formulation: With patient Time For Goal Achievement: 01/01/12 Potential to Achieve Goals: Good Pt will go Supine/Side to Sit: with min assist PT Goal: Supine/Side to Sit - Progress: Goal set today Pt will go Sit to Stand: with min assist PT Goal: Sit to Stand - Progress: Goal set today Pt will go Stand to Sit: with min assist PT Goal: Stand to Sit - Progress: Goal set today Pt will Ambulate: 51 - 150 feet;with min assist;with rolling walker PT Goal: Ambulate - Progress: Goal set today  Visit Information  Last PT Received On: 12/25/11 Assistance Needed: +2 PT/OT Co-Evaluation/Treatment: Yes    Subjective Data  Subjective: I don't know how I feel. Just sick.  Patient Stated Goal: none stated   Prior Functioning  Home Living Lives With:  (From [facility]) Available Help at Discharge: Skilled Nursing Facility Type of Home: Assisted living Home Adaptive Equipment: Walker - rolling Prior Function Level of Independence: Independent with assistive device(s) Driving: No Communication Communication: Expressive difficulties (initally pt had some word finding difficulty, then improved)    Cognition  Overall Cognitive Status: History of cognitive impairments - at baseline Arousal/Alertness: Awake/alert  Orientation Level: Time Behavior During Session: [facility] for tasks performed     Extremity/Trunk Assessment Right Upper Extremity Assessment RUE ROM/Strength/Tone: Deficits RUE ROM/Strength/Tone Deficits: see OT eval Left Upper Extremity Assessment LUE ROM/Strength/Tone: Deficits Right Lower Extremity Assessment RLE ROM/Strength/Tone: WFL for tasks assessed RLE Sensation: WFL - Light Touch RLE Coordination: WFL - gross/fine motor Left Lower Extremity Assessment LLE ROM/Strength/Tone: WFL for tasks assessed LLE Sensation: WFL - Light Touch LLE Coordination: WFL - gross/fine motor Trunk Assessment Trunk Assessment: Kyphotic   Balance Balance Balance Assessed: Yes Static Sitting Balance Static Sitting - Balance Support: Left upper extremity supported;Feet supported Static Sitting - Level of Assistance: 2: Max assist;4: Min assist Static Sitting - Comment/# of Minutes: While sitting on EOB Pt had R lateral LOB  x 3 requiring max assist to correct, at times able to maintain upright position with BUE support  End of Session PT - End of Session Equipment Utilized During Treatment: Gait belt Patient left: in chair;with call bell/phone within reach;with chair alarm set Nurse Communication: Mobility status  GP     Ralene Bathe Kistler 12/25/2011, 11:53 AM  [phone]

## 2011-12-25 NOTE — Progress Notes (Signed)
Bilateral:  No evidence of hemodynamically significant internal carotid artery stenosis.   Vertebral artery flow is antegrade.     

## 2011-12-25 NOTE — Clinical Social Work Psychosocial (Unsigned)
     Clinical Social Work Department BRIEF PSYCHOSOCIAL ASSESSMENT 12/25/2011  Patient:  [name], [name]     Account Number:  [id number]     Admit date:  12/24/2011  Clinical Social Worker:  Hattie Perch  Date/Time:  12/25/2011 12:00 M  Referred by:  Physician  Date Referred:  12/25/2011 Referred for  ALF Placement   Other Referral:   Interview type:  Patient Other interview type:   family    PSYCHOSOCIAL DATA Living Status:  FACILITY Admitted from facility:  [facility] Level of care:  Assisted Living Primary support name:  howard Nichol Primary support relationship to patient:  CHILD, ADULT Degree of support available:   good    CURRENT CONCERNS Current Concerns  Post-Acute Placement   Other Concerns:    SOCIAL WORK ASSESSMENT / PLAN CSW met with patient and son at bedside. patient is alert and oriented X3. patient is from [facility] facility. both patient and son confirm patient will return there upon discharge.   Assessment/plan status:   Other assessment/ plan:   Information/referral to community resources:    PATIENTS/FAMILYS RESPONSE TO PLAN OF CARE: agreeable to return to [facility] assisted living upon discharge.

## 2011-12-25 NOTE — Evaluation (Signed)
Clinical/Bedside Swallow Evaluation Patient Details  Name: [name] MRN: [id number] Date of Birth: 03-30-1922  Today's Date: 12/25/2011 Time: 1201-1223    Past Medical History:  Past Medical History  Diagnosis Date  . Diverticulosis of colon (without mention of hemorrhage) 06/24/2006  . Carcinoma of cecum 1992    History of  . Vitamin B12 deficiency   . Alzheimer's disease   . HTN (hypertension)   . Depression   . HLD (hyperlipidemia)   . Osteoporosis, unspecified   . PVD (peripheral vascular disease)   . CAD (coronary artery disease)   . GERD (gastroesophageal reflux disease)   . H. pylori infection 2004  . Presbyesophagus    Past Surgical History:  Past Surgical History  Procedure Date  . Gallbladder surgery   . Abdominal hysterectomy 1995    with bso  . Right colectomy   . Cataract extraction 2001   HPI:  Pt is 78 yo female, resident of SNF who was brought to [facility] ED for further evaluation of dizziness and unsteady gait that was noted by nursing staff at the facility. Pt is unable to provide history due to advance dementia and confusion and this history was obtained from records and ED physician. Pt apparently had several falls earlier the day of admission and unsteady gait was noticed. Pt apparently able to ambulate with walker. It is unclear if pt had any chest pain, shortness of breath, abdominal or urinary concerns, any specific focal neurological weakness.   Assessment / Plan / Recommendation Clinical Impression  Patient presents with a moderate oral dysphagia, as evidenced by difficulty masticating and manitpulating solid boluses.  Functionally, patient refused everything on her tray except peaches and apple sauce, and 2 bits of roll.  Prolonged chewing of roll notes.  SLP also observed patient take pills with water.  Positive choking on water was noted x1.  Patient took pills in applesauce without difficulty.  Thin liquids without pills appeared to be tolerated.       Aspiration Risk  Mild    Diet Recommendation Dysphagia 2 (Fine chop);Thin liquid   Liquid Administration via: Straw Medication Administration: Whole meds with puree Supervision: Patient able to self feed;Full supervision/cueing for compensatory strategies Compensations: Slow rate;Small sips/bites;Check for pocketing Postural Changes and/or Swallow Maneuvers: Seated upright 90 degrees;Upright 30-60 min after meal    Other  Recommendations Oral Care Recommendations: Oral care QID;Staff/trained caregiver to provide oral care Other Recommendations: Clarify dietary restrictions   Follow Up Recommendations  Skilled Nursing facility    Frequency and Duration min 2x/week  2 weeks   Pertinent Vitals/Pain n/a    SLP Swallow Goals Patient will consume recommended diet without observed clinical signs of aspiration with: Supervision/safety;Set-up;Minimal assistance Patient will utilize recommended strategies during swallow to increase swallowing safety with: Supervision/safety;Minimal assistance   Swallow Study Prior Functional Status  Type of Home: Assisted living Lives With:  (From [facility]F) Available Help at Discharge: Skilled Nursing Facility    General HPI: Pt is 78 yo female, resident of SNF who was brought to [facility] ED for further evaluation of dizziness and unsteady gait that was noted by nursing staff at the facility. Pt is unable to provide history due to advance dementia and confusion and this history was obtained from records and ED physician. Pt apparently had several falls earlier the day of admission and unsteady gait was noticed. Pt apparently able to ambulate with walker. It is unclear if pt had any chest pain, shortness of breath, abdominal or  urinary concerns, any specific focal neurological weakness. Type of Study: Bedside swallow evaluation Previous Swallow Assessment: Esophagram 8/07: Slight dysmotility and presbyesophagus; Esophagram in 2004 noted prominent  Cricopharyngeua;  Dilitation 10/02 per Dr. Juanda Chance.  Patient recalls that she should not drink cold liquids as it may cause esophageal spasm. Diet Prior to this Study: Regular;Thin liquids Temperature Spikes Noted: No Respiratory Status: Room air History of Recent Intubation: No Behavior/Cognition: Alert;Cooperative;Confused;Impulsive;Distractible;Requires cueing;Decreased sustained attention Oral Cavity - Dentition: Adequate natural dentition Self-Feeding Abilities: Able to feed self;Needs set up Patient Positioning: Upright in bed Baseline Vocal Quality: Clear Volitional Cough: Strong Volitional Swallow: Able to elicit    Oral/Motor/Sensory Function Overall Oral Motor/Sensory Function: Appears within functional limits for tasks assessed   Ice Chips     Thin Liquid Thin Liquid: Within functional limits Presentation: Straw    Nectar Thick Nectar Thick Liquid: Not tested   Honey Thick Honey Thick Liquid: Not tested   Puree Puree: Within functional limits Presentation: Self Fed;Spoon   Solid   GO    Solid: Impaired (bread) Presentation: Self Fed Oral Phase Impairments: Impaired anterior to posterior transit;Reduced lingual movement/coordination Oral Phase Functional Implications: Oral residue       Maryjo Rochester T 12/25/2011,12:43 PM

## 2011-12-26 DIAGNOSIS — R42 Dizziness and giddiness: Secondary | ICD-10-CM

## 2011-12-26 LAB — URINE CULTURE: Colony Count: NO GROWTH

## 2011-12-26 MED ORDER — HALOPERIDOL LACTATE 5 MG/ML IJ SOLN: 1.0000 mg | Freq: Four times a day (QID) | INTRAMUSCULAR | Status: DC | PRN

## 2011-12-26 MED ORDER — HALOPERIDOL LACTATE 5 MG/ML IJ SOLN
2.0000 mg | Freq: Once | INTRAMUSCULAR | Status: AC
  Administered 2011-12-26: 2 mg via INTRAVENOUS
  Filled 2011-12-26: qty 1

## 2011-12-26 NOTE — Progress Notes (Signed)
SPEECH PATHOLOGY  Noted that diet was changed back to a Regular diet (Carb Modified), after BSE found oral dysphagia, recommending Dys. 2 diet (chopped).  Discussed with RN, who does not know why order was changed (and there is no note indicating why).    Because patient appears to be less confused today, will change recommendation to Dysphagia 3 with chopped meats.  Meds should be giving with applesauce.  RN agrees.  SLP will f/u for assessment of diet toleration.  Maryjo Rochester T, CCC-SLP

## 2011-12-26 NOTE — Progress Notes (Signed)
Patient ID: EGYPT WELCOME  female  WJX:[id number]    DOB: 10/18/1922    DOA: 12/24/2011  PCP: [name], MD  Assessment/Plan: Principal Problem:  *Dizziness: - Stroke workup negative, MRI ruled out acute CVA, 2-D echo unremarkable, carotid Dopplers did not show acute ICA stenosis. Continue aspirin and statin.  Active Problems:  GERD (gastroesophageal reflux disease): Continue PPI   HTN (hypertension) stable  DVT Prophylaxis: Lovenox  Code Status: Full code  Disposition: Hopefully tomorrow    Subjective:  No complaints per patient  Objective: Weight change: 0.7 kg (1 lb 8.7 oz)  Intake/Output Summary (Last 24 hours) at 12/26/11 1329 Last data filed at 12/26/11 0857  Gross per 24 hour  Intake    360 ml  Output    450 ml  Net    -90 ml   Blood pressure 140/64, pulse 71, temperature 98.7 F (37.1 C), temperature source Axillary, resp. rate 18, height 5\' 1"  (1.549 m), weight 64.7 kg (142 lb 10.2 oz), SpO2 97.00%.  Physical Exam: General: Alert and awake, oriented x3, not in any acute distress. HEENT: anicteric sclera, pupils reactive to light and accommodation, EOMI CVS: S1-S2 clear, no murmur rubs or gallops Chest: clear to auscultation bilaterally, no wheezing, rales or rhonchi Abdomen: soft nontender, nondistended, normal bowel sounds, no organomegaly Extremities: no cyanosis, clubbing or edema noted bilaterally Neuro: Cranial nerves II-XII intact, no focal neurological deficits  Lab Results: Basic Metabolic Panel:  Lab 12/25/11 2130 12/24/11 1545  NA 140 139  K 3.2* 3.1*  CL 102 99  CO2 27 28  GLUCOSE 77 84  BUN 21 22  CREATININE 0.67 0.79  CALCIUM 9.2 10.0  MG -- 2.3  PHOS -- 3.7   No results found for this basename: AMMONIA:2 in the last 168 hours CBC:  Lab 12/25/11 0358 12/24/11 1545  WBC 5.2 6.5  NEUTROABS -- 4.9  HGB 11.5* 12.1  HCT 34.6* 36.1  MCV 86.7 86.4  PLT 158 175   Cardiac Enzymes:  Lab 12/25/11 0358 12/24/11 2211 12/24/11 1630   CKTOTAL -- -- --  CKMB -- -- --  CKMBINDEX -- -- --  TROPONINI 0.31* 0.32* 0.43*   BNP: No components found with this basename: POCBNP:2 CBG: No results found for this basename: GLUCAP:5 in the last 168 hours   Micro Results: Recent Results (from the past 240 hour(s))  URINE CULTURE     Status: Normal   Collection Time   12/24/11  7:59 PM      Component Value Range Status Comment   Specimen Description URINE, RANDOM   Final    Special Requests NONE   Final    Culture  Setup Time 12/25/2011 07:02   Final    Colony Count NO GROWTH   Final    Culture NO GROWTH   Final    Report Status 12/26/2011 FINAL   Final   MRSA PCR SCREENING     Status: Normal   Collection Time   12/24/11 11:45 PM      Component Value Range Status Comment   MRSA by PCR NEGATIVE  NEGATIVE Final     Studies/Results: Ct Head Wo Contrast  12/24/2011  *RADIOLOGY REPORT*  Clinical Data: Coronary artery disease.  Dizziness and nausea  CT HEAD WITHOUT CONTRAST  Technique:  Contiguous axial images were obtained from the base of the skull through the vertex without contrast.  Comparison: 09/04/2010  Findings: There is diffuse patchy low density throughout the subcortical and periventricular white matter consistent  with chronic small vessel ischemic change.  There is prominence of the sulci and ventricles consistent with brain atrophy.  There is no evidence for acute brain infarct, hemorrhage or mass.  A right frontal scalp hematoma is identified.  No underlying skull fracture identified.  IMPRESSION:  1.  Small vessel ischemic disease and brain atrophy. 2.  No acute intracranial abnormalities noted.   Original Report Authenticated By: Signa Kell, M.D.    Mr [facility] Wo Contrast  12/25/2011  *RADIOLOGY REPORT*  Clinical Data:  Dizziness and nausea with frequent falls. Dementia. Hypertension.  MRI BRAIN WITHOUT CONTRAST MRA HEAD WITHOUT CONTRAST  Technique: Multiplanar, multiecho pulse sequences of the brain and  surrounding structures were obtained according to standard protocol without intravenous contrast.  Angiographic images of the head were obtained using MRA technique without contrast.  Comparison: 12/24/2011 head CT.  09/05/2009 brain MR.  MRI HEAD  Findings:  Motion degraded exam.  The patient was not able to complete coronal T2 imaging.  No acute infarct.  No intracranial hemorrhage.  Marked small vessel disease type changes.  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  Global atrophy.  Ventricular prominence probably related to atrophy rather hydrocephalus.  No intracranial mass lesion detected on this unenhanced exam. Cervical spondylotic changes with mild spinal stenosis upper cervical spine.  Transverse ligament hypertrophy with cranial settling of the C1 ring and subsequent mild superior displacement of the dens.  Partially empty sella.  Mild paranasal sinus mucosal thickening.  IMPRESSION: Motion degraded exam.  No acute infarct.  Prominent small vessel disease type changes.  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  Global atrophy.  MRA HEAD  Findings: Motion degraded exam.  Evaluating degree of stenosis and for presence of an aneurysm is limited given the degree of motion.  What can be stated with certainty on the present examination is that there is flow within the internal carotid arteries, distal vertebral arteries and basilar artery.  Stenosis A1 segment right anterior cerebral artery may be present. Hypoplastic/aplastic A1 segment left anterior cerebral artery with suggestion of narrowing of the proximal A2 segment of the left anterior cerebral artery.  4 x 3 mm aneurysm left posterior communicating artery region may be present.  Limited evaluation of the middle cerebral artery branch vessels which appear narrowed and irregular.  Right PICA not completely imaged.  Narrowed left PICA.  There may be stenosis of the distal basilar artery.  Nonvisualization AICAs.   Poor delineation of a majority of the posterior cerebral arteries and the superior cerebral arteries.  IMPRESSION: Motion degraded examination as detailed above.  Intracranial atherosclerotic type changes suspected although incompletely assessed.  4 x 3 mm left posterior communicating artery aneurysm may be present.   Original Report Authenticated By: Lacy Duverney, M.D.    Mr Brain Wo Contrast  12/25/2011  *RADIOLOGY REPORT*  Clinical Data:  Dizziness and nausea with frequent falls. Dementia. Hypertension.  MRI BRAIN WITHOUT CONTRAST MRA HEAD WITHOUT CONTRAST  Technique: Multiplanar, multiecho pulse sequences of the brain and surrounding structures were obtained according to standard protocol without intravenous contrast.  Angiographic images of the head were obtained using MRA technique without contrast.  Comparison: 12/24/2011 head CT.  09/05/2009 brain MR.  MRI HEAD  Findings:  Motion degraded exam.  The patient was not able to complete coronal T2 imaging.  No acute infarct.  No intracranial hemorrhage.  Marked small vessel disease type changes.  Remote right basal ganglia and left cerebellar small  infarcts.  Global atrophy.  Ventricular prominence probably related to atrophy rather hydrocephalus.  No intracranial mass lesion detected on this unenhanced exam. Cervical spondylotic changes with mild spinal stenosis upper cervical spine.  Transverse ligament hypertrophy with cranial settling of the C1 ring and subsequent mild superior displacement of the dens.  Partially empty sella.  Mild paranasal sinus mucosal thickening.  IMPRESSION: Motion degraded exam.  No acute infarct.  Prominent small vessel disease type changes.  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  Global atrophy.  MRA HEAD  Findings: Motion degraded exam.  Evaluating degree of stenosis and for presence of an aneurysm is limited given the degree of motion.  What can be stated with certainty on the present examination is that  there is flow within the internal carotid arteries, distal vertebral arteries and basilar artery.  Stenosis A1 segment right anterior cerebral artery may be present. Hypoplastic/aplastic A1 segment left anterior cerebral artery with suggestion of narrowing of the proximal A2 segment of the left anterior cerebral artery.  4 x 3 mm aneurysm left posterior communicating artery region may be present.  Limited evaluation of the middle cerebral artery branch vessels which appear narrowed and irregular.  Right PICA not completely imaged.  Narrowed left PICA.  There may be stenosis of the distal basilar artery.  Nonvisualization AICAs.  Poor delineation of a majority of the posterior cerebral arteries and the superior cerebral arteries.  IMPRESSION: Motion degraded examination as detailed above.  Intracranial atherosclerotic type changes suspected although incompletely assessed.  4 x 3 mm left posterior communicating artery aneurysm may be present.   Original Report Authenticated By: Lacy Duverney, M.D.     Medications: Scheduled Meds:   . acetaminophen  650 mg Oral TID  . aspirin  81 mg Oral Daily  . atorvastatin  5 mg Oral QHS  . cholecalciferol  400 Units Oral Daily  . divalproex  125 mg Oral BID  . donepezil  10 mg Oral QHS  . DULoxetine  60 mg Oral Daily  . enoxaparin (LOVENOX) injection  40 mg Subcutaneous Q24H  . ferrous sulfate  325 mg Oral Q breakfast  . hydrochlorothiazide  12.5 mg Oral Daily  . irbesartan  75 mg Oral Daily  . lisinopril  10 mg Oral Daily  . LORazepam  0.25 mg Oral TID  . sodium chloride  3 mL Intravenous Q12H  . sucralfate  1 g Oral TID AC & HS      LOS: 2 days   RAI,RIPUDEEP M.D. [facility] 12/26/2011, 1:29 PM Pager: [phone]  If 7PM-7AM, please contact night-coverage [email or web address] Password [email or web address]

## 2011-12-26 NOTE — Progress Notes (Signed)
Speech Language Pathology Dysphagia Treatment Patient Details Name: [name] MRN: [id number] DOB: 1922/03/10 Today's Date: 12/26/2011 Time: 1150-1209 SLP Time Calculation (min): 19 min  Assessment / Plan / Recommendation Clinical Impression  Patient appears less confused today, and reports she tolerated her soft diet much better.  SLP observed patient with thin liquids.  No overt s/s of aspiration were noted.    Diet Recommendation  Continue with Current Diet: Dysphagia 2 (fine chop);Thin liquid    SLP Plan Continue with current plan of care   Pertinent Vitals/Pain n/a   Swallowing Goals  SLP Swallowing Goals Patient will consume recommended diet without observed clinical signs of aspiration with: Supervision/safety;Set-up;Minimal assistance Swallow Study Goal #1 - Progress: Progressing toward goal Patient will utilize recommended strategies during swallow to increase swallowing safety with: Supervision/safety;Minimal assistance Swallow Study Goal #2 - Progress: Progressing toward goal  General Temperature Spikes Noted: No Respiratory Status: Room air Behavior/Cognition: Alert;Cooperative;Confused;Impulsive;Distractible;Requires cueing;Decreased sustained attention Oral Cavity - Dentition: Adequate natural dentition Patient Positioning: Upright in chair  Oral Cavity - Oral Hygiene Does patient have any of the following "at risk" factors?: Lips - dry, cracked Brush patient's teeth BID with toothbrush (using toothpaste with fluoride): Yes Patient is HIGH RISK - Oral Care Protocol followed (see row info): Yes Patient is AT RISK - Oral Care Protocol followed (see row info): Yes   Dysphagia Treatment Treatment focused on: Skilled observation of diet tolerance;Patient/family/caregiver education Treatment Methods/Modalities: Skilled observation Patient observed directly with PO's: Yes Type of PO's observed: Thin liquids Feeding: Able to feed self;Needs set up;Needs  assist Liquids provided via: Cup Type of cueing: Verbal Amount of cueing: Minimal   GO     Maryjo Rochester T 12/26/2011, 12:16 PM

## 2011-12-26 NOTE — Progress Notes (Signed)
Patient now in need of snf placement. CSW called patient's son with bed offers. Patient is more confused today.  [name] C. [name] MSW, LCSW [phone]

## 2011-12-26 NOTE — Progress Notes (Signed)
12/25/11 1152  PT G-Codes **NOT FOR INPATIENT CLASS**  Functional Assessment Tool Used clincial judgement as seenin chart review  Functional Limitation Mobility: Walking and moving around  Mobility: Walking and Moving Around Current Status (Z6109) CL  Mobility: Walking and Moving Around Goal Status (U0454) CI  PT General Charges  $$ ACUTE PT VISIT 1 Procedure  PT Evaluation  $Initial PT Evaluation Tier I 1 Procedure  PT Treatments  $Therapeutic Activity 8-22 mins  Eval performed and documentated by Mellody Dance, PT and G-codes added by Marella Bile, PT.

## 2011-12-26 NOTE — Progress Notes (Signed)
   12/25/11 1212  OT G-codes **NOT FOR INPATIENT CLASS**  Functional Assessment Tool Used Clinical Judgement  Functional Limitation Self care  Self Care Current Status (H0865) CM  Self Care Goal Status (H8469) CK  OT General Charges  $OT Visit 1 Procedure  OT Evaluation  $Initial OT Evaluation Tier I 1 Procedure  OT Treatments  $Therapeutic Activity 23-37 mins   Garrel Ridgel, OTR/L  Pager [phone] 12/26/2011  Late entry G code from 12/25/11

## 2011-12-26 NOTE — Progress Notes (Signed)
Pt became very confused and agitated this evening. Pt was climbing out of bed continually with bed rails raised and bed alarm on. Pt was very disoriented and threatening to "kick the butts" of staff while walking unsteadily around the room. Charge RN was called. MD was paged. Dr. Isidoro Donning gave orders for Haldol IV prn. Orders placed. Haldol given. Will continue to monitor pt and pass off report to night shift RN.

## 2011-12-26 NOTE — Progress Notes (Signed)
Physical Therapy Treatment Patient Details Name: [name] MRN: [id number] DOB: Nov 25, 1922 Today's Date: 12/26/2011 Time: 8295-6213 PT Time Calculation (min): 28 min  PT Assessment / Plan / Recommendation Comments on Treatment Session  Pt. appears to be functioing better today, able to ambulate with 1 person in hall x 100 ft. Pt. pleasantly confused.    Follow Up Recommendations  SNF     Does the patient have the potential to tolerate intense rehabilitation     Barriers to Discharge        Equipment Recommendations  None recommended by PT    Recommendations for Other Services    Frequency Min 3X/week   Plan Discharge plan remains appropriate;Frequency remains appropriate    Precautions / Restrictions Precautions Precautions: Fall Restrictions Weight Bearing Restrictions: No   Pertinent Vitals/Pain i hurt all over.    Mobility  Bed Mobility Bed Mobility: Supine to Sit Supine to Sit: 4: Min assist Details for Bed Mobility Assistance: assistance to get to sitting. Transfers Sit to Stand: 4: Min assist;From bed Stand to Sit: To chair/3-in-1;With upper extremity assist Details for Transfer Assistance: cues for safety and to reach to recliner. Ambulation/Gait Ambulation/Gait Assistance: 4: Min assist Ambulation Distance (Feet): 100 Feet Assistive device: Rolling walker Ambulation/Gait Assistance Details: pt. takes extra time for activity.wants to take a full bath but encouraged to ambulate first. Gait Pattern: Decreased stride length;Trunk flexed Gait velocity: decreased    Exercises     PT Diagnosis:    PT Problem List:   PT Treatment Interventions:     PT Goals Acute Rehab PT Goals Pt will go Supine/Side to Sit: with supervision PT Goal: Supine/Side to Sit - Progress: Updated due to goal met Pt will go Sit to Stand: with supervision PT Goal: Sit to Stand - Progress: Updated due to goal met Pt will go Stand to Sit: with supervision PT Goal: Stand to Sit -  Progress: Updated due to goals met Pt will Ambulate: with supervision;>150 feet;with rolling walker PT Goal: Ambulate - Progress: Updated due to goal met  Visit Information  Last PT Received On: 12/26/11 Assistance Needed: +1    Subjective Data  Subjective: I need to wash up/   Cognition  Overall Cognitive Status: History of cognitive impairments - at baseline Arousal/Alertness: Awake/alert Orientation Level: Time;Situation Behavior During Session: [facility] for tasks performed    Balance  Static Sitting Balance Static Sitting - Balance Support: No upper extremity supported Static Sitting - Level of Assistance: 5: Stand by assistance  End of Session PT - End of Session Activity Tolerance: Patient tolerated treatment well Patient left: in chair;with call bell/phone within reach;with chair alarm set Nurse Communication: Mobility status   GP     Rada Hay 12/26/2011, 11:48 AM

## 2011-12-27 MED ORDER — LORAZEPAM 0.5 MG PO TABS
0.5000 mg | ORAL_TABLET | Freq: Three times a day (TID) | ORAL | Status: DC
  Administered 2011-12-27 – 2011-12-28 (×3): 0.5 mg via ORAL
  Filled 2011-12-27 (×3): qty 1

## 2011-12-27 MED ORDER — HALOPERIDOL LACTATE 5 MG/ML IJ SOLN: 1.0000 mg | Freq: Once | INTRAMUSCULAR | Status: AC

## 2011-12-27 MED ORDER — HALOPERIDOL LACTATE 5 MG/ML IJ SOLN
0.5000 mg | Freq: Once | INTRAMUSCULAR | Status: DC
  Filled 2011-12-27 (×2): qty 1

## 2011-12-27 MED ORDER — HALOPERIDOL 1 MG PO TABS
1.0000 mg | ORAL_TABLET | Freq: Once | ORAL | Status: AC
  Administered 2011-12-27: 1 mg via ORAL
  Filled 2011-12-27: qty 1

## 2011-12-27 NOTE — Care Management Note (Signed)
    Page 1 of 1   12/28/2011     10:45:42 AM   CARE MANAGEMENT NOTE 12/28/2011  Patient:  [name], [name]   Account Number:  [id number]  Date Initiated:  12/27/2011  Documentation initiated by:  Lanier Clam  Subjective/Objective Assessment:   ADMITTED W/SYNCOPE.     Action/Plan:   FROM [facility]   Anticipated DC Date:  12/28/2011   Anticipated DC Plan:  SKILLED NURSING FACILITY      DC Planning Services  CM consult      Choice offered to / List presented to:             Status of service:  Completed, signed off Medicare Important Message given?   (If response is "NO", the following Medicare IM given date fields will be blank) Date Medicare IM given:   Date Additional Medicare IM given:    Discharge Disposition:  SKILLED NURSING FACILITY  Per UR Regulation:  Reviewed for med. necessity/level of care/duration of stay  If discussed at Long Length of Stay Meetings, dates discussed:    Comments:  12/28/11 Naseem Varden RN,BSN NCM [phone] D/C SNF TODAY.  12/27/11 Yunuen Mordan RN,BSN NCM [phone] AGITATION YESTERDAY EVENING-HALDOL IV.PT-SNF.D/C PLAN SNF.

## 2011-12-27 NOTE — Progress Notes (Addendum)
Speech Language Pathology Dysphagia Treatment Patient Details Name: [name] MRN: [id number] DOB: 1922/03/19 Today's Date: 12/27/2011 Time: 1420-1430 SLP Time Calculation (min): 10 min  Assessment / Plan / Recommendation Clinical Impression  Pt agitated today per nurse tech report and is awaiting receipt of haldol.  Observed pt to graham cracker and water:  timely swallow and no clinical indicators of aspiration nor oral pocketing.  Pt reports her dentures fit well and her swallow to be baseline.    Rec advance to regular - soft meats- thin.  SLP educated pt to aspiration precautions and indicators of dysphagia.  Rec do not feed pt if she is lethargic from medications.  Spoke to RN regarding recommendations.  No further SLP indicated.   Thanks.     Diet Recommendation  Initiate / Change Diet: Regular;Thin liquid (soft meats)    SLP Plan All goals met   Pertinent Vitals/Pain Afebrile, intake 25-50%   Swallowing Goals  SLP Swallowing Goals Swallow Study Goal #1 - Progress: Met Swallow Study Goal #2 - Progress: Met  General Temperature Spikes Noted: No Respiratory Status: Room air Behavior/Cognition: Alert;Cooperative;Pleasant mood;Impulsive (awaiting haldol to be administered d/t agitation) Patient Positioning: Upright in chair  Oral Cavity - Oral Hygiene   oral cavity clean  Dysphagia Treatment Treatment focused on: Skilled observation of diet tolerance;Patient/family/caregiver education Family/Caregiver Educated: pt Treatment Methods/Modalities: Skilled observation Patient observed directly with PO's: Yes Type of PO's observed: Regular;Thin liquids Feeding: Able to feed self Liquids provided via: Straw Type of cueing: Verbal Amount of cueing: Minimal   GO Functional Assessment Tool Used: clinical observation Functional Limitations: Swallowing Swallow Current Status (W0981): At least 1 percent but less than 20 percent impaired, limited or restricted Swallow Goal  Status [phone]): At least 1 percent but less than 20 percent impaired, limited or restricted Swallow Discharge Status [phone]): At least 1 percent but less than 20 percent impaired, limited or restricted   Chales Abrahams 12/27/2011, 2:40 PM

## 2011-12-27 NOTE — Progress Notes (Signed)
Patient ID: [name]  female  NFA:[id number]    DOB: 02-05-1922    DOA: 12/24/2011  PCP: [name], MD  Assessment/Plan: Principal Problem:  *Dizziness: - Stroke workup negative, MRI ruled out acute CVA, 2-D echo unremarkable, carotid Dopplers did not show acute ICA stenosis. Continue aspirin and statin.  Active Problems:  GERD (gastroesophageal reflux disease): Continue PPI   HTN (hypertension) stable  Acute encephalopathy: patient needed haldol last night, possibly sundowning - She is on TID lorazepam which I have increased.     DVT Prophylaxis: Lovenox  Code Status: Full code  Disposition: Hopefully tomorrow    Subjective:  No complaints per patient today morning, alert and awake  Objective: Weight change: -1.4 kg (-3 lb 1.4 oz)  Intake/Output Summary (Last 24 hours) at 12/27/11 1332 Last data filed at 12/27/11 1030  Gross per 24 hour  Intake    520 ml  Output    250 ml  Net    270 ml   Blood pressure 127/55, pulse 91, temperature 97.8 F (36.6 C), temperature source Oral, resp. rate 19, height 5\' 1"  (1.549 m), weight 63.3 kg (139 lb 8.8 oz), SpO2 94.00%.  Physical Exam: General: Alert and awake, oriented x3, not in any acute distress. HEENT: anicteric sclera, pupils reactive to light and accommodation, EOMI CVS: S1-S2 clear, no murmur rubs or gallops Chest: clear to auscultation bilaterally, no wheezing, rales or rhonchi Abdomen: soft nontender, nondistended, normal bowel sounds, no organomegaly Extremities: no cyanosis, clubbing or edema noted bilaterally Neuro: Cranial nerves II-XII intact, no focal neurological deficits  Lab Results: Basic Metabolic Panel:  Lab 12/25/11 4696 12/24/11 1545  NA 140 139  K 3.2* 3.1*  CL 102 99  CO2 27 28  GLUCOSE 77 84  BUN 21 22  CREATININE 0.67 0.79  CALCIUM 9.2 10.0  MG -- 2.3  PHOS -- 3.7   No results found for this basename: AMMONIA:2 in the last 168 hours CBC:  Lab 12/25/11 0358 12/24/11 1545  WBC  5.2 6.5  NEUTROABS -- 4.9  HGB 11.5* 12.1  HCT 34.6* 36.1  MCV 86.7 86.4  PLT 158 175   Cardiac Enzymes:  Lab 12/25/11 0358 12/24/11 2211 12/24/11 1630  CKTOTAL -- -- --  CKMB -- -- --  CKMBINDEX -- -- --  TROPONINI 0.31* 0.32* 0.43*   BNP: No components found with this basename: POCBNP:2 CBG: No results found for this basename: GLUCAP:5 in the last 168 hours   Micro Results: Recent Results (from the past 240 hour(s))  URINE CULTURE     Status: Normal   Collection Time   12/24/11  7:59 PM      Component Value Range Status Comment   Specimen Description URINE, RANDOM   Final    Special Requests NONE   Final    Culture  Setup Time 12/25/2011 07:02   Final    Colony Count NO GROWTH   Final    Culture NO GROWTH   Final    Report Status 12/26/2011 FINAL   Final   MRSA PCR SCREENING     Status: Normal   Collection Time   12/24/11 11:45 PM      Component Value Range Status Comment   MRSA by PCR NEGATIVE  NEGATIVE Final     Studies/Results: Ct Head Wo Contrast  12/24/2011  *RADIOLOGY REPORT*  Clinical Data: Coronary artery disease.  Dizziness and nausea  CT HEAD WITHOUT CONTRAST  Technique:  Contiguous axial images were obtained from the  base of the skull through the vertex without contrast.  Comparison: 09/04/2010  Findings: There is diffuse patchy low density throughout the subcortical and periventricular white matter consistent with chronic small vessel ischemic change.  There is prominence of the sulci and ventricles consistent with brain atrophy.  There is no evidence for acute brain infarct, hemorrhage or mass.  A right frontal scalp hematoma is identified.  No underlying skull fracture identified.  IMPRESSION:  1.  Small vessel ischemic disease and brain atrophy. 2.  No acute intracranial abnormalities noted.   Original Report Authenticated By: Signa Kell, M.D.    Mr [facility] Wo Contrast  12/25/2011  *RADIOLOGY REPORT*  Clinical Data:  Dizziness and nausea with frequent  falls. Dementia. Hypertension.  MRI BRAIN WITHOUT CONTRAST MRA HEAD WITHOUT CONTRAST  Technique: Multiplanar, multiecho pulse sequences of the brain and surrounding structures were obtained according to standard protocol without intravenous contrast.  Angiographic images of the head were obtained using MRA technique without contrast.  Comparison: 12/24/2011 head CT.  09/05/2009 brain MR.  MRI HEAD  Findings:  Motion degraded exam.  The patient was not able to complete coronal T2 imaging.  No acute infarct.  No intracranial hemorrhage.  Marked small vessel disease type changes.  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  Global atrophy.  Ventricular prominence probably related to atrophy rather hydrocephalus.  No intracranial mass lesion detected on this unenhanced exam. Cervical spondylotic changes with mild spinal stenosis upper cervical spine.  Transverse ligament hypertrophy with cranial settling of the C1 ring and subsequent mild superior displacement of the dens.  Partially empty sella.  Mild paranasal sinus mucosal thickening.  IMPRESSION: Motion degraded exam.  No acute infarct.  Prominent small vessel disease type changes.  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  Global atrophy.  MRA HEAD  Findings: Motion degraded exam.  Evaluating degree of stenosis and for presence of an aneurysm is limited given the degree of motion.  What can be stated with certainty on the present examination is that there is flow within the internal carotid arteries, distal vertebral arteries and basilar artery.  Stenosis A1 segment right anterior cerebral artery may be present. Hypoplastic/aplastic A1 segment left anterior cerebral artery with suggestion of narrowing of the proximal A2 segment of the left anterior cerebral artery.  4 x 3 mm aneurysm left posterior communicating artery region may be present.  Limited evaluation of the middle cerebral artery branch vessels which appear  narrowed and irregular.  Right PICA not completely imaged.  Narrowed left PICA.  There may be stenosis of the distal basilar artery.  Nonvisualization AICAs.  Poor delineation of a majority of the posterior cerebral arteries and the superior cerebral arteries.  IMPRESSION: Motion degraded examination as detailed above.  Intracranial atherosclerotic type changes suspected although incompletely assessed.  4 x 3 mm left posterior communicating artery aneurysm may be present.   Original Report Authenticated By: Lacy Duverney, M.D.    Mr Brain Wo Contrast  12/25/2011  *RADIOLOGY REPORT*  Clinical Data:  Dizziness and nausea with frequent falls. Dementia. Hypertension.  MRI BRAIN WITHOUT CONTRAST MRA HEAD WITHOUT CONTRAST  Technique: Multiplanar, multiecho pulse sequences of the brain and surrounding structures were obtained according to standard protocol without intravenous contrast.  Angiographic images of the head were obtained using MRA technique without contrast.  Comparison: 12/24/2011 head CT.  09/05/2009 brain MR.  MRI HEAD  Findings:  Motion degraded exam.  The patient was not able to  complete coronal T2 imaging.  No acute infarct.  No intracranial hemorrhage.  Marked small vessel disease type changes.  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  Global atrophy.  Ventricular prominence probably related to atrophy rather hydrocephalus.  No intracranial mass lesion detected on this unenhanced exam. Cervical spondylotic changes with mild spinal stenosis upper cervical spine.  Transverse ligament hypertrophy with cranial settling of the C1 ring and subsequent mild superior displacement of the dens.  Partially empty sella.  Mild paranasal sinus mucosal thickening.  IMPRESSION: Motion degraded exam.  No acute infarct.  Prominent small vessel disease type changes.  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  Global atrophy.  MRA HEAD  Findings: Motion degraded exam.  Evaluating  degree of stenosis and for presence of an aneurysm is limited given the degree of motion.  What can be stated with certainty on the present examination is that there is flow within the internal carotid arteries, distal vertebral arteries and basilar artery.  Stenosis A1 segment right anterior cerebral artery may be present. Hypoplastic/aplastic A1 segment left anterior cerebral artery with suggestion of narrowing of the proximal A2 segment of the left anterior cerebral artery.  4 x 3 mm aneurysm left posterior communicating artery region may be present.  Limited evaluation of the middle cerebral artery branch vessels which appear narrowed and irregular.  Right PICA not completely imaged.  Narrowed left PICA.  There may be stenosis of the distal basilar artery.  Nonvisualization AICAs.  Poor delineation of a majority of the posterior cerebral arteries and the superior cerebral arteries.  IMPRESSION: Motion degraded examination as detailed above.  Intracranial atherosclerotic type changes suspected although incompletely assessed.  4 x 3 mm left posterior communicating artery aneurysm may be present.   Original Report Authenticated By: Lacy Duverney, M.D.     Medications: Scheduled Meds:    . acetaminophen  650 mg Oral TID  . aspirin  81 mg Oral Daily  . atorvastatin  5 mg Oral QHS  . cholecalciferol  400 Units Oral Daily  . divalproex  125 mg Oral BID  . donepezil  10 mg Oral QHS  . DULoxetine  60 mg Oral Daily  . enoxaparin (LOVENOX) injection  40 mg Subcutaneous Q24H  . ferrous sulfate  325 mg Oral Q breakfast  . [COMPLETED] haloperidol lactate  2 mg Intravenous Once  . hydrochlorothiazide  12.5 mg Oral Daily  . lisinopril  10 mg Oral Daily  . LORazepam  0.5 mg Oral TID  . sodium chloride  3 mL Intravenous Q12H  . sucralfate  1 g Oral TID AC & HS  . [DISCONTINUED] irbesartan  75 mg Oral Daily  . [DISCONTINUED] LORazepam  0.25 mg Oral TID      LOS: 3 days   Tehila Sokolow M.D. [facility] 12/27/2011, 1:32 PM Pager: [phone]  If 7PM-7AM, please contact night-coverage [email or web address] Password [email or web address]

## 2011-12-27 NOTE — Progress Notes (Signed)
Pt was noncompliant, repeatedly getting out of the chair and out of bed without calling and without asking for assistance. Pt is not steady on feet and a very high fall risk.  Pt was visibly agitated and becoming frustrated with staff. Dr. Isidoro Donning was paged, and orders for a one time dose of p.o. Haldol were given. Haldol was given to pt. Will continue to closely monitor changes in pt status.

## 2011-12-28 DIAGNOSIS — G301 Alzheimer's disease with late onset: Secondary | ICD-10-CM | POA: Diagnosis present

## 2011-12-28 DIAGNOSIS — F0281 Dementia in other diseases classified elsewhere with behavioral disturbance: Secondary | ICD-10-CM | POA: Diagnosis present

## 2011-12-28 DIAGNOSIS — G9340 Encephalopathy, unspecified: Secondary | ICD-10-CM | POA: Diagnosis present

## 2011-12-28 LAB — CLOSTRIDIUM DIFFICILE BY PCR: Toxigenic C. Difficile by PCR: NEGATIVE

## 2011-12-28 MED ORDER — LORAZEPAM 0.5 MG PO TABS: 0.5000 mg | ORAL_TABLET | Freq: Three times a day (TID) | ORAL | Status: DC

## 2011-12-28 MED ORDER — LORAZEPAM 0.5 MG PO TABS: 0.2500 mg | ORAL_TABLET | Freq: Three times a day (TID) | ORAL | Status: DC

## 2011-12-28 NOTE — Progress Notes (Signed)
Psych consult received.  Chart review.  No psych CSW needs identified.  Psych MD to assess Pt.  Please contact psych CSW if psych CSW needs arise.  Providence Crosby, LCSWA Clinical Social Work [phone]

## 2011-12-28 NOTE — Progress Notes (Signed)
12/25/11 1201  SLP G-Codes **NOT FOR INPATIENT CLASS**  Functional Assessment Tool Used clinical judgement with chart review  Functional Limitations Swallowing  Swallow Current Status (Z6109) CJ  Swallow Goal Status (U0454) CI  SLP Evaluations  $ SLP Speech Visit 1 Procedure  SLP Evaluations  $BSS Swallow 1 Procedure  $Swallowing Treatment 1 Procedure   Completed on behalf of Maryjo Rochester by Donavan Burnet, SLP  Donavan Burnet, MS [facility] SLP [phone]

## 2011-12-28 NOTE — Progress Notes (Signed)
Patient cleared for discharge. Packet copied and placed in [place]. Patient going to [facility]. Son agreeable. ptar called for transportation.  Jasneet Schobert C. Tyshana Nishida MSW, LCSW [phone]

## 2011-12-28 NOTE — Discharge Summary (Signed)
Physician Discharge Summary  Patient ID: [name] MRN: [id number] DOB/AGE: July 07, 1922 78 y.o.  Admit date: 12/24/2011 Discharge date: 12/28/2011  Primary Care Physician:  [name], MD  Discharge Diagnoses:   Present on Admission:  . Dizziness . HTN (hypertension) . GERD (gastroesophageal reflux disease) . Encephalopathy acute . Dementia  Consults:  None  Discharge Medications:   Medication List     As of 12/28/2011  8:44 AM    STOP taking these medications         valsartan 320 MG tablet   Commonly known as: DIOVAN      TAKE these medications         acetaminophen 325 MG tablet   Commonly known as: TYLENOL   Take 650 mg by mouth 3 (three) times daily.      alendronate 70 MG tablet   Commonly known as: FOSAMAX   Take 70 mg by mouth every 7 (seven) days. Take with a full glass of water on an empty stomach.      aspirin 81 MG tablet   Take 81 mg by mouth daily.      atorvastatin 10 MG tablet   Commonly known as: LIPITOR   Take 5 mg by mouth at bedtime.      Calcium-Vitamin D 600-200 MG-UNIT per tablet   Take 1 tablet by mouth daily.      cholecalciferol 400 UNITS Tabs   Commonly known as: VITAMIN D   Take 400 Units by mouth daily.      DAILY VITAMINS PO   Take 1 tablet by mouth daily.      divalproex 125 MG capsule   Commonly known as: DEPAKOTE SPRINKLE   Take 125 mg by mouth 2 (two) times daily.      donepezil 10 MG tablet   Commonly known as: ARICEPT   Take 10 mg by mouth at bedtime.      DULoxetine 60 MG capsule   Commonly known as: CYMBALTA   Take 60 mg by mouth daily.      ferrous sulfate 325 (65 FE) MG tablet   Take 325 mg by mouth daily with breakfast.      hydrochlorothiazide 12.5 MG capsule   Commonly known as: MICROZIDE   Take 12.5 mg by mouth daily.      lisinopril 10 MG tablet   Commonly known as: PRINIVIL,ZESTRIL   Take 10 mg by mouth daily.      LORazepam 0.5 MG tablet   Commonly known as: ATIVAN   Take 1 tablet (0.5  mg total) by mouth 3 (three) times daily.      sucralfate 1 GM/10ML suspension   Commonly known as: CARAFATE   Take 1 g by mouth 4 (four) times daily -  before meals and at bedtime.      vitamin B-12 1000 MCG tablet   Commonly known as: CYANOCOBALAMIN   Take 1,000 mcg by mouth every other day.         Brief H and P: For complete details please refer to admission H and P, but in brief Pt is 78 yo female, resident of SNF who was brought to [facility] ED for further evaluation of dizziness and unsteady gait that was noted by nursing staff at the facility. Pt is unable to provide history due to advance dementia and confusion and this history was obtained from records and ED physician. Pt apparently had several falls earlier the day of admission and unsteady gait was noticed. Pt apparently  able to ambulate with walker. It was unclear if pt had any chest pain, shortness of breath, abdominal or urinary concerns, any specific focal neurological weakness.   Hospital Course:  *Dizziness:  Stroke workup was negative, MRI ruled out acute CVA showed prominent small vessel type changes and remote infarcts, 2-D echo showed EF of 65%, grade 1 diastolic dysfunctione, carotid Dopplers did not show acute ICA stenosis. Continue aspirin and statin.   GERD (gastroesophageal reflux disease): Continue PPI   HTN (hypertension) stable.    Acute encephalopathy: Currently appears to be at her baseline, patient has a history of advanced dementia and likely had significant sundowning and hospital psychosis due to change in the environment requiring Haldol. Patient was continued on Aricept and her home dose of lorazepam was increased. Fall precautions. PT recommended SNF placement.  Day of Discharge BP 144/43  Pulse 74  Temp 97.8 F (36.6 C) (Oral)  Resp 20  Ht 5\' 1"  (1.549 m)  Wt 63.3 kg (139 lb 8.8 oz)  BMI 26.37 kg/m2  SpO2 92%  Physical Exam: General: Alert and awake oriented x2, not in any acute distress,  pleasant, eating breakfast. HEENT: anicteric sclera, pupils reactive to light and accommodation CVS: S1-S2 clear no murmur rubs or gallops Chest: clear to auscultation bilaterally, no wheezing rales or rhonchi Abdomen: soft nontender, nondistended, normal bowel sounds, no organomegaly Extremities: no cyanosis, clubbing or edema noted bilaterally Neuro: Cranial nerves II-XII intact, no focal neurological deficits   The results of significant diagnostics from this hospitalization (including imaging, microbiology, ancillary and laboratory) are listed below for reference.    LAB RESULTS: Basic Metabolic Panel:  Lab 12/25/11 4098 12/24/11 1545  NA 140 139  K 3.2* 3.1*  CL 102 99  CO2 27 28  GLUCOSE 77 84  BUN 21 22  CREATININE 0.67 0.79  CALCIUM 9.2 10.0  MG -- 2.3  PHOS -- 3.7   CBC:  Lab 12/25/11 0358 12/24/11 1545  WBC 5.2 6.5  NEUTROABS -- 4.9  HGB 11.5* 12.1  HCT 34.6* 36.1  MCV 86.7 --  PLT 158 175   Cardiac Enzymes:  Lab 12/25/11 0358 12/24/11 2211  CKTOTAL -- --  CKMB -- --  CKMBINDEX -- --  TROPONINI 0.31* 0.32*   BNP: No components found with this basename: POCBNP:2 CBG: No results found for this basename: GLUCAP:2 in the last 168 hours  Significant Diagnostic Studies:  Ct Head Wo Contrast  12/24/2011  *RADIOLOGY REPORT*  Clinical Data: Coronary artery disease.  Dizziness and nausea  CT HEAD WITHOUT CONTRAST  Technique:  Contiguous axial images were obtained from the base of the skull through the vertex without contrast.  Comparison: 09/04/2010  Findings: There is diffuse patchy low density throughout the subcortical and periventricular white matter consistent with chronic small vessel ischemic change.  There is prominence of the sulci and ventricles consistent with brain atrophy.  There is no evidence for acute brain infarct, hemorrhage or mass.  A right frontal scalp hematoma is identified.  No underlying skull fracture identified.  IMPRESSION:  1.  Small  vessel ischemic disease and brain atrophy. 2.  No acute intracranial abnormalities noted.   Original Report Authenticated By: Signa Kell, M.D.    Mr [facility] Wo Contrast  12/25/2011  *RADIOLOGY REPORT*  Clinical Data:  Dizziness and nausea with frequent falls. Dementia. Hypertension.  MRI BRAIN WITHOUT CONTRAST MRA HEAD WITHOUT CONTRAST  Technique: Multiplanar, multiecho pulse sequences of the brain and surrounding structures were obtained according to standard  protocol without intravenous contrast.  Angiographic images of the head were obtained using MRA technique without contrast.  Comparison: 12/24/2011 head CT.  09/05/2009 brain MR.  MRI HEAD  Findings:  Motion degraded exam.  The patient was not able to complete coronal T2 imaging.  No acute infarct.  No intracranial hemorrhage.  Marked small vessel disease type changes.  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  Global atrophy.  Ventricular prominence probably related to atrophy rather hydrocephalus.  No intracranial mass lesion detected on this unenhanced exam. Cervical spondylotic changes with mild spinal stenosis upper cervical spine.  Transverse ligament hypertrophy with cranial settling of the C1 ring and subsequent mild superior displacement of the dens.  Partially empty sella.  Mild paranasal sinus mucosal thickening.  IMPRESSION: Motion degraded exam.  No acute infarct.  Prominent small vessel disease type changes.  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  Global atrophy.  MRA HEAD  Findings: Motion degraded exam.  Evaluating degree of stenosis and for presence of an aneurysm is limited given the degree of motion.  What can be stated with certainty on the present examination is that there is flow within the internal carotid arteries, distal vertebral arteries and basilar artery.  Stenosis A1 segment right anterior cerebral artery may be present. Hypoplastic/aplastic A1 segment left anterior cerebral  artery with suggestion of narrowing of the proximal A2 segment of the left anterior cerebral artery.  4 x 3 mm aneurysm left posterior communicating artery region may be present.  Limited evaluation of the middle cerebral artery branch vessels which appear narrowed and irregular.  Right PICA not completely imaged.  Narrowed left PICA.  There may be stenosis of the distal basilar artery.  Nonvisualization AICAs.  Poor delineation of a majority of the posterior cerebral arteries and the superior cerebral arteries.  IMPRESSION: Motion degraded examination as detailed above.  Intracranial atherosclerotic type changes suspected although incompletely assessed.  4 x 3 mm left posterior communicating artery aneurysm may be present.   Original Report Authenticated By: Lacy Duverney, M.D.    Mr Brain Wo Contrast  12/25/2011  *RADIOLOGY REPORT*  Clinical Data:  Dizziness and nausea with frequent falls. Dementia. Hypertension.  MRI BRAIN WITHOUT CONTRAST MRA HEAD WITHOUT CONTRAST  Technique: Multiplanar, multiecho pulse sequences of the brain and surrounding structures were obtained according to standard protocol without intravenous contrast.  Angiographic images of the head were obtained using MRA technique without contrast.  Comparison: 12/24/2011 head CT.  09/05/2009 brain MR.  MRI HEAD  Findings:  Motion degraded exam.  The patient was not able to complete coronal T2 imaging.  No acute infarct.  No intracranial hemorrhage.  Marked small vessel disease type changes.  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  Global atrophy.  Ventricular prominence probably related to atrophy rather hydrocephalus.  No intracranial mass lesion detected on this unenhanced exam. Cervical spondylotic changes with mild spinal stenosis upper cervical spine.  Transverse ligament hypertrophy with cranial settling of the C1 ring and subsequent mild superior displacement of the dens.  Partially empty sella.  Mild paranasal sinus  mucosal thickening.  IMPRESSION: Motion degraded exam.  No acute infarct.  Prominent small vessel disease type changes.  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  Global atrophy.  MRA HEAD  Findings: Motion degraded exam.  Evaluating degree of stenosis and for presence of an aneurysm is limited given the degree of motion.  What can be stated with certainty  on the present examination is that there is flow within the internal carotid arteries, distal vertebral arteries and basilar artery.  Stenosis A1 segment right anterior cerebral artery may be present. Hypoplastic/aplastic A1 segment left anterior cerebral artery with suggestion of narrowing of the proximal A2 segment of the left anterior cerebral artery.  4 x 3 mm aneurysm left posterior communicating artery region may be present.  Limited evaluation of the middle cerebral artery branch vessels which appear narrowed and irregular.  Right PICA not completely imaged.  Narrowed left PICA.  There may be stenosis of the distal basilar artery.  Nonvisualization AICAs.  Poor delineation of a majority of the posterior cerebral arteries and the superior cerebral arteries.  IMPRESSION: Motion degraded examination as detailed above.  Intracranial atherosclerotic type changes suspected although incompletely assessed.  4 x 3 mm left posterior communicating artery aneurysm may be present.   Original Report Authenticated By: Lacy Duverney, M.D.      Disposition and Follow-up:     Discharge Orders    Future Orders Please Complete By Expires   Diet - low sodium heart healthy      Increase activity slowly      Discharge instructions      Comments:   FALL PRECAUTIONS       DISPOSITION: SNF  DIET: heart healthy, soft meats, thin liquids  ACTIVITY: fall precautions   DISCHARGE FOLLOW-UP Follow-up Information    Follow up with [name], MD. Schedule an appointment as soon as possible for a visit in 7 days. (for hospital follow-up)     Contact information:   [address] [place] [address] [phone]          Time spent on Discharge: 45 mins  Signed:   Sota Hetz M.D. [facility] 12/28/2011, 8:44 AM Pager: [phone]

## 2012-01-02 DIAGNOSIS — D509 Iron deficiency anemia, unspecified: Secondary | ICD-10-CM

## 2012-01-02 HISTORY — DX: Iron deficiency anemia, unspecified: D50.9

## 2012-01-07 ENCOUNTER — Encounter (HOSPITAL_COMMUNITY): Payer: Self-pay | Admitting: *Deleted

## 2012-01-07 ENCOUNTER — Emergency Department (HOSPITAL_COMMUNITY)
Admission: EM | Admit: 2012-01-07 | Discharge: 2012-01-07 | Disposition: A | Discharge status: Home / Self Care | Payer: Medicare Other | Attending: Emergency Medicine | Admitting: Emergency Medicine

## 2012-01-07 ENCOUNTER — Emergency Department (HOSPITAL_COMMUNITY): Payer: Medicare Other

## 2012-01-07 DIAGNOSIS — E785 Hyperlipidemia, unspecified: Secondary | ICD-10-CM | POA: Insufficient documentation

## 2012-01-07 DIAGNOSIS — Z7982 Long term (current) use of aspirin: Secondary | ICD-10-CM | POA: Insufficient documentation

## 2012-01-07 DIAGNOSIS — Z8719 Personal history of other diseases of the digestive system: Secondary | ICD-10-CM | POA: Insufficient documentation

## 2012-01-07 DIAGNOSIS — Z79899 Other long term (current) drug therapy: Secondary | ICD-10-CM | POA: Insufficient documentation

## 2012-01-07 DIAGNOSIS — I739 Peripheral vascular disease, unspecified: Secondary | ICD-10-CM | POA: Insufficient documentation

## 2012-01-07 DIAGNOSIS — F0390 Unspecified dementia without behavioral disturbance: Secondary | ICD-10-CM | POA: Insufficient documentation

## 2012-01-07 DIAGNOSIS — Z8619 Personal history of other infectious and parasitic diseases: Secondary | ICD-10-CM | POA: Insufficient documentation

## 2012-01-07 DIAGNOSIS — F329 Major depressive disorder, single episode, unspecified: Secondary | ICD-10-CM | POA: Insufficient documentation

## 2012-01-07 DIAGNOSIS — F3289 Other specified depressive episodes: Secondary | ICD-10-CM | POA: Insufficient documentation

## 2012-01-07 DIAGNOSIS — Z85038 Personal history of other malignant neoplasm of large intestine: Secondary | ICD-10-CM | POA: Insufficient documentation

## 2012-01-07 DIAGNOSIS — Z87891 Personal history of nicotine dependence: Secondary | ICD-10-CM | POA: Insufficient documentation

## 2012-01-07 DIAGNOSIS — F0280 Dementia in other diseases classified elsewhere without behavioral disturbance: Secondary | ICD-10-CM | POA: Insufficient documentation

## 2012-01-07 DIAGNOSIS — I2510 Atherosclerotic heart disease of native coronary artery without angina pectoris: Secondary | ICD-10-CM | POA: Insufficient documentation

## 2012-01-07 DIAGNOSIS — I10 Essential (primary) hypertension: Secondary | ICD-10-CM | POA: Insufficient documentation

## 2012-01-07 DIAGNOSIS — G309 Alzheimer's disease, unspecified: Secondary | ICD-10-CM | POA: Insufficient documentation

## 2012-01-07 DIAGNOSIS — Z8739 Personal history of other diseases of the musculoskeletal system and connective tissue: Secondary | ICD-10-CM | POA: Insufficient documentation

## 2012-01-07 LAB — COMPREHENSIVE METABOLIC PANEL
BUN: 23 mg/dL (ref 6–23)
CO2: 28 mEq/L (ref 19–32)
Calcium: 10.1 mg/dL (ref 8.4–10.5)
Creatinine, Ser: 1.11 mg/dL — ABNORMAL HIGH (ref 0.50–1.10)
GFR calc Af Amer: 50 mL/min — ABNORMAL LOW (ref >90)
GFR calc non Af Amer: 43 mL/min — ABNORMAL LOW (ref >90)
Glucose, Bld: 92 mg/dL (ref 70–99)

## 2012-01-07 LAB — URINALYSIS, ROUTINE W REFLEX MICROSCOPIC
Bilirubin Urine: NEGATIVE
Hgb urine dipstick: NEGATIVE
Specific Gravity, Urine: 1.038 — ABNORMAL HIGH (ref 1.005–1.030)
pH: 5.5 (ref 5.0–8.0)

## 2012-01-07 LAB — CBC
Hemoglobin: 10.8 g/dL — ABNORMAL LOW (ref 12.0–15.0)
MCH: 28.4 pg (ref 26.0–34.0)
MCHC: 33.2 g/dL (ref 30.0–36.0)
MCV: 85.5 fL (ref 78.0–100.0)
RBC: 3.8 MIL/uL — ABNORMAL LOW (ref 3.87–5.11)

## 2012-01-07 MED ORDER — POTASSIUM CHLORIDE CRYS ER 20 MEQ PO TBCR
40.0000 meq | EXTENDED_RELEASE_TABLET | Freq: Once | ORAL | Status: AC
  Administered 2012-01-07: 40 meq via ORAL
  Filled 2012-01-07: qty 2

## 2012-01-07 NOTE — ED Notes (Signed)
[facility] called for transport.  

## 2012-01-07 NOTE — ED Notes (Signed)
MD at bedside. 

## 2012-01-07 NOTE — ED Notes (Addendum)
Per EMS report: Pt from [facility]: Pt is a new resident at [facility] or about a week.  Staff reports pt has been aggressive and combative for the past few days especially with [employer].  Facility reports pt has a unsteady gait and has fallen all week but no injuries or deformities noted.  Pt has a hx of dementia.  Pt is a/o x 2.  Staff at facility gave pt 1mg  Ativan IM at 2:10.

## 2012-01-07 NOTE — ED Provider Notes (Addendum)
History     CSN: [id number]  Arrival date & time 01/07/12  4098   First MD Initiated Contact with Patient 01/07/12 0345      No chief complaint on file.   (Consider location/radiation/quality/duration/timing/severity/associated sxs/prior treatment) HPI History per EMS and nursing home. Resides at [facility], has history of dementia. Staff concerned tonight because patient was combative. No reported injuries or fall. Baseline mentation otherwise. No reported fevers or vomiting. Per EMS patient is pleasant and cooperative. level 5 caveat applies - she denies any complaints.  Past Medical History  Diagnosis Date  . Diverticulosis of colon (without mention of hemorrhage) 06/24/2006  . Carcinoma of cecum 1992    History of  . Vitamin B12 deficiency   . Alzheimer's disease   . HTN (hypertension)   . Depression   . HLD (hyperlipidemia)   . Osteoporosis, unspecified   . PVD (peripheral vascular disease)   . CAD (coronary artery disease)   . GERD (gastroesophageal reflux disease)   . H. pylori infection 2004  . Presbyesophagus     Past Surgical History  Procedure Date  . Gallbladder surgery   . Abdominal hysterectomy 1995    with bso  . Right colectomy   . Cataract extraction 2001    Family History  Problem Relation Age of Onset  . Heart disease Mother   . Colon cancer Neg Hx     History  Substance Use Topics  . Smoking status: Former Games developer  . Smokeless tobacco: Never Used  . Alcohol Use: No    OB History    Grav Para Term Preterm Abortions TAB SAB Ect Mult Living                  Review of Systems  Unable to perform ROS level 5 caveat as above  Allergies  Review of patient's allergies indicates no known allergies.  Home Medications   Current Outpatient Rx  Name  Route  Sig  Dispense  Refill  . ACETAMINOPHEN 325 MG PO TABS   Oral   Take 650 mg by mouth 3 (three) times daily.         . ALENDRONATE SODIUM 70 MG PO TABS   Oral  Take 70 mg by mouth every 7 (seven) days. Take with a full glass of water on an empty stomach.         . ASPIRIN 81 MG PO TABS   Oral   Take 81 mg by mouth daily.         . ATORVASTATIN CALCIUM 10 MG PO TABS   Oral   Take 5 mg by mouth at bedtime.         Marland Kitchen CALCIUM-VITAMIN D 600-200 MG-UNIT PO TABS   Oral   Take 1 tablet by mouth daily.         . CHOLECALCIFEROL 400 UNITS PO TABS   Oral   Take 400 Units by mouth daily.         Marland Kitchen DIVALPROEX SODIUM 125 MG PO CPSP   Oral   Take 125 mg by mouth 2 (two) times daily.         . DONEPEZIL HCL 10 MG PO TABS   Oral   Take 10 mg by mouth at bedtime.         . DULOXETINE HCL 60 MG PO CPEP   Oral   Take 60 mg by mouth daily.         Marland Kitchen FERROUS SULFATE 325 (  65 FE) MG PO TABS   Oral   Take 325 mg by mouth daily with breakfast.         . HYDROCHLOROTHIAZIDE 12.5 MG PO CAPS   Oral   Take 12.5 mg by mouth daily.         Marland Kitchen LISINOPRIL 10 MG PO TABS   Oral   Take 10 mg by mouth daily.         Marland Kitchen LORAZEPAM 0.5 MG PO TABS   Oral   Take 1 tablet (0.5 mg total) by mouth 3 (three) times daily.   30 tablet   0   . DAILY VITAMINS PO   Oral   Take 1 tablet by mouth daily.         . SUCRALFATE 1 GM/10ML PO SUSP   Oral   Take 1 g by mouth 4 (four) times daily -  before meals and at bedtime.         Marland Kitchen VITAMIN B-12 1000 MCG PO TABS   Oral   Take 1,000 mcg by mouth every other day.           BP 121/60  Pulse 87  Temp 97.7 F (36.5 C) (Oral)  Resp 18  SpO2 98%  Physical Exam  Nursing note and vitals reviewed. Constitutional: She appears well-developed and well-nourished.  HENT:  Head: Normocephalic and atraumatic.  Eyes: EOM are normal. Pupils are equal, round, and reactive to light. No scleral icterus.  Neck: Neck supple.  Cardiovascular: Normal rate, normal heart sounds and intact distal pulses.   Pulmonary/Chest: Effort normal and breath sounds normal. No respiratory distress.  Abdominal: Soft.  Bowel sounds are normal. She exhibits no distension. There is no tenderness.  Musculoskeletal: Normal range of motion. She exhibits no edema.  Neurological: No cranial nerve deficit.       Awake, alert, pleasant and cooperative. No focal deficits  Skin: Skin is warm and dry.  Psychiatric: She has a normal mood and affect.    ED Course  Procedures (including critical care time)  Results for orders placed during the hospital encounter of 01/07/12  URINALYSIS, ROUTINE W REFLEX MICROSCOPIC      Component Value Range   Color, Urine YELLOW  YELLOW   APPearance CLEAR  CLEAR   Specific Gravity, Urine 1.038 (*) 1.005 - 1.030   pH 5.5  5.0 - 8.0   Glucose, UA NEGATIVE  NEGATIVE mg/dL   Hgb urine dipstick NEGATIVE  NEGATIVE   Bilirubin Urine NEGATIVE  NEGATIVE   Ketones, ur TRACE (*) NEGATIVE mg/dL   Protein, ur NEGATIVE  NEGATIVE mg/dL   Urobilinogen, UA 0.2  0.0 - 1.0 mg/dL   Nitrite NEGATIVE  NEGATIVE   Leukocytes, UA NEGATIVE  NEGATIVE  CBC      Component Value Range   WBC 9.7  4.0 - 10.5 K/uL   RBC 3.80 (*) 3.87 - 5.11 MIL/uL   Hemoglobin 10.8 (*) 12.0 - 15.0 g/dL   HCT 16.1 (*) 09.6 - 04.5 %   MCV 85.5  78.0 - 100.0 fL   MCH 28.4  26.0 - 34.0 pg   MCHC 33.2  30.0 - 36.0 g/dL   RDW 40.9  81.1 - 91.4 %   Platelets 254  150 - 400 K/uL  COMPREHENSIVE METABOLIC PANEL      Component Value Range   Sodium 137  135 - 145 mEq/L   Potassium 3.2 (*) 3.5 - 5.1 mEq/L   Chloride 97  96 - 112 mEq/L  CO2 28  19 - 32 mEq/L   Glucose, Bld 92  70 - 99 mg/dL   BUN 23  6 - 23 mg/dL   Creatinine, Ser 7.82 (*) 0.50 - 1.10 mg/dL   Calcium 95.6  8.4 - 21.3 mg/dL   Total Protein 6.2  6.0 - 8.3 g/dL   Albumin 3.4 (*) 3.5 - 5.2 g/dL   AST 33  0 - 37 U/L   ALT 20  0 - 35 U/L   Alkaline Phosphatase 55  39 - 117 U/L   Total Bilirubin 0.7  0.3 - 1.2 mg/dL   GFR calc non Af Amer 43 (*) >90 mL/min   GFR calc Af Amer 50 (*) >90 mL/min    Dg Chest Portable 1 View  01/07/2012  *RADIOLOGY REPORT*   Clinical Data: Altered mental status  PORTABLE CHEST - 1 VIEW  Comparison: 12/11/2009  Findings: Hypoaeration with interstitial and vascular crowding. Elevation of the hemidiaphragms, left greater than right.  This obscures the lung bases however allowing for this there are mild bibasilar opacities.  Aortic tortuosity.  Mild cardiac prominence. Osteopenia.  No pneumothorax.  IMPRESSION: Hypoaeration with bibasilar opacities; atelectasis versus infiltrate.   Original Report Authenticated By: Jearld Lesch, M.D.     PO fluids    Date: 01/07/2012  Rate: 90  Rhythm: normal sinus rhythm  QRS Axis: left  Intervals: normal  ST/T Wave abnormalities: nonspecific ST changes  Conduction Disutrbances:none  Narrative Interpretation:   Old EKG Reviewed: unchanged  Serial evaluations unchanged. No evidence of infection. Patient is cooperative, pleasant and appropriate  MDM   Nursing home patient with dementia sent to the emergency department for evaluation and reported combative behavior, found to be pleasant and cooperative with workup as above. EKG, labs, urinalysis and imaging obtained and reviewed. Vital signs, nursing notes and old records reviewed.        Sunnie Nielsen, MD 01/07/12 0865  Sunnie Nielsen, MD 01/07/12 [phone]

## 2012-01-07 NOTE — ED Notes (Signed)
[id number]<AV> Expected date:<BR> Expected time:<BR> Means of arrival:<BR> Comments:<BR> Elderly from Nursing Facility-dementia/aggressive and combative

## 2012-01-10 ENCOUNTER — Emergency Department (HOSPITAL_COMMUNITY): Payer: Medicare Other

## 2012-01-10 ENCOUNTER — Encounter (HOSPITAL_COMMUNITY): Payer: Self-pay | Admitting: *Deleted

## 2012-01-10 ENCOUNTER — Emergency Department (HOSPITAL_COMMUNITY)
Admission: EM | Admit: 2012-01-10 | Discharge: 2012-01-11 | Disposition: A | Discharge status: Skilled Nursing Facility | Payer: Medicare Other | Attending: Emergency Medicine | Admitting: Emergency Medicine

## 2012-01-10 DIAGNOSIS — K5732 Diverticulitis of large intestine without perforation or abscess without bleeding: Secondary | ICD-10-CM | POA: Insufficient documentation

## 2012-01-10 DIAGNOSIS — Y9210 Unspecified residential institution as the place of occurrence of the external cause: Secondary | ICD-10-CM | POA: Insufficient documentation

## 2012-01-10 DIAGNOSIS — R296 Repeated falls: Secondary | ICD-10-CM

## 2012-01-10 DIAGNOSIS — I2510 Atherosclerotic heart disease of native coronary artery without angina pectoris: Secondary | ICD-10-CM | POA: Insufficient documentation

## 2012-01-10 DIAGNOSIS — G309 Alzheimer's disease, unspecified: Secondary | ICD-10-CM | POA: Insufficient documentation

## 2012-01-10 DIAGNOSIS — F0280 Dementia in other diseases classified elsewhere without behavioral disturbance: Secondary | ICD-10-CM | POA: Insufficient documentation

## 2012-01-10 DIAGNOSIS — I739 Peripheral vascular disease, unspecified: Secondary | ICD-10-CM | POA: Insufficient documentation

## 2012-01-10 DIAGNOSIS — Z9049 Acquired absence of other specified parts of digestive tract: Secondary | ICD-10-CM | POA: Insufficient documentation

## 2012-01-10 DIAGNOSIS — Z87891 Personal history of nicotine dependence: Secondary | ICD-10-CM | POA: Insufficient documentation

## 2012-01-10 DIAGNOSIS — M810 Age-related osteoporosis without current pathological fracture: Secondary | ICD-10-CM | POA: Insufficient documentation

## 2012-01-10 DIAGNOSIS — E538 Deficiency of other specified B group vitamins: Secondary | ICD-10-CM | POA: Insufficient documentation

## 2012-01-10 DIAGNOSIS — Z79899 Other long term (current) drug therapy: Secondary | ICD-10-CM | POA: Insufficient documentation

## 2012-01-10 DIAGNOSIS — K219 Gastro-esophageal reflux disease without esophagitis: Secondary | ICD-10-CM | POA: Insufficient documentation

## 2012-01-10 DIAGNOSIS — Y939 Activity, unspecified: Secondary | ICD-10-CM | POA: Insufficient documentation

## 2012-01-10 DIAGNOSIS — I10 Essential (primary) hypertension: Secondary | ICD-10-CM | POA: Insufficient documentation

## 2012-01-10 DIAGNOSIS — W19XXXA Unspecified fall, initial encounter: Secondary | ICD-10-CM | POA: Insufficient documentation

## 2012-01-10 DIAGNOSIS — F329 Major depressive disorder, single episode, unspecified: Secondary | ICD-10-CM | POA: Insufficient documentation

## 2012-01-10 DIAGNOSIS — E785 Hyperlipidemia, unspecified: Secondary | ICD-10-CM | POA: Insufficient documentation

## 2012-01-10 DIAGNOSIS — Z90710 Acquired absence of both cervix and uterus: Secondary | ICD-10-CM | POA: Insufficient documentation

## 2012-01-10 DIAGNOSIS — F3289 Other specified depressive episodes: Secondary | ICD-10-CM | POA: Insufficient documentation

## 2012-01-10 DIAGNOSIS — Z85038 Personal history of other malignant neoplasm of large intestine: Secondary | ICD-10-CM | POA: Insufficient documentation

## 2012-01-10 DIAGNOSIS — Z7982 Long term (current) use of aspirin: Secondary | ICD-10-CM | POA: Insufficient documentation

## 2012-01-10 DIAGNOSIS — K228 Other specified diseases of esophagus: Secondary | ICD-10-CM | POA: Insufficient documentation

## 2012-01-10 DIAGNOSIS — Z9181 History of falling: Secondary | ICD-10-CM | POA: Insufficient documentation

## 2012-01-10 DIAGNOSIS — Z8619 Personal history of other infectious and parasitic diseases: Secondary | ICD-10-CM | POA: Insufficient documentation

## 2012-01-10 DIAGNOSIS — K2289 Other specified disease of esophagus: Secondary | ICD-10-CM | POA: Insufficient documentation

## 2012-01-10 DIAGNOSIS — Z9849 Cataract extraction status, unspecified eye: Secondary | ICD-10-CM | POA: Insufficient documentation

## 2012-01-10 NOTE — ED Notes (Signed)
Per EMS report: pt coming from [facility] nursing [facility] with c/o fall. Per EMS staff reported they found pt on the floor in front of her wheelchair. Per EMS it was unwitnessed fall. Per EMS: pt is at nonverbal at baseline with hx of dementia (late stage) but is able to follow commands. Per EMS pt has denies pain. No LOC reported. VSS per EMS

## 2012-01-10 NOTE — ED Provider Notes (Signed)
History     CSN: [id number]  Arrival date & time 01/10/12  2004   First MD Initiated Contact with Patient 01/10/12 2126      Chief Complaint  Patient presents with  . Fall    (Consider location/radiation/quality/duration/timing/severity/associated sxs/prior treatment) HPI Comments: Level 5 caveat due to dementia.  Patient is sent here from [facility] by EMS. Patient was apparently found on the floor near her wheelchair. Patient does have frequent falls do to limited mobility. Per staff nurse, the patient has been falling frequently over the last few weeks. The patient is relatively new to this facility although she had an attendant there in the past. The patient has a history of severe dementia with behavioral problems. The patient was seen a few days ago for combativeness. She apparently had a full workup and was discharged back to the facility. The staff nurse reports that the patient and was complaining of pain but essentially the patient was saying yes to every question of where she was hurting but I asked her. Here the patient reports mild headache but denies neck pain, back pain, chest pain, abdominal pain, pelvic pain. I reviewed the ED visit from 3 days ago and the patient had blood tests, urinalysis and a chest x-ray all of which were unrevealing.  The history is provided by the patient, a caregiver and medical records.    Past Medical History  Diagnosis Date  . Diverticulosis of colon (without mention of hemorrhage) 06/24/2006  . Carcinoma of cecum 1992    History of  . Vitamin B12 deficiency   . Alzheimer's disease   . HTN (hypertension)   . Depression   . HLD (hyperlipidemia)   . Osteoporosis, unspecified   . PVD (peripheral vascular disease)   . CAD (coronary artery disease)   . GERD (gastroesophageal reflux disease)   . H. pylori infection 2004  . Presbyesophagus     Past Surgical History  Procedure Date  . Gallbladder surgery   . Abdominal hysterectomy 1995     with bso  . Right colectomy   . Cataract extraction 2001    Family History  Problem Relation Age of Onset  . Heart disease Mother   . Colon cancer Neg Hx     History  Substance Use Topics  . Smoking status: Former Games developer  . Smokeless tobacco: Never Used  . Alcohol Use: No    OB History    Grav Para Term Preterm Abortions TAB SAB Ect Mult Living                  Review of Systems  Unable to perform ROS: Dementia    Allergies  Review of patient's allergies indicates no known allergies.  Home Medications   Current Outpatient Rx  Name  Route  Sig  Dispense  Refill  . ACETAMINOPHEN 325 MG PO TABS   Oral   Take 650 mg by mouth 3 (three) times daily. At 9am, 3pm, and 9pm         . ALENDRONATE SODIUM 70 MG PO TABS   Oral   Take 70 mg by mouth every 7 (seven) days. Take on Saturdays with a full glass of water on an empty stomach.         . ASPIRIN 81 MG PO TABS   Oral   Take 81 mg by mouth every morning.          . ATORVASTATIN CALCIUM 10 MG PO TABS   Oral  Take 5 mg by mouth at bedtime.         Marland Kitchen CALCIUM-VITAMIN D 600-200 MG-UNIT PO TABS   Oral   Take 1 tablet by mouth every morning.          . CHOLECALCIFEROL 400 UNITS PO TABS   Oral   Take 400 Units by mouth every morning.          Marland Kitchen DIVALPROEX SODIUM 125 MG PO CPSP   Oral   Take 250 mg by mouth 2 (two) times daily. At 9am and 9pm         . DONEPEZIL HCL 10 MG PO TABS   Oral   Take 10 mg by mouth at bedtime.         . DULOXETINE HCL 60 MG PO CPEP   Oral   Take 60 mg by mouth every morning.          Marland Kitchen FERROUS SULFATE 325 (65 FE) MG PO TABS   Oral   Take 325 mg by mouth daily with breakfast.         . HYDROCHLOROTHIAZIDE 12.5 MG PO CAPS   Oral   Take 12.5 mg by mouth every morning.          Marland Kitchen LISINOPRIL 10 MG PO TABS   Oral   Take 10 mg by mouth every morning.         Marland Kitchen LORAZEPAM 0.5 MG PO TABS   Oral   Take 0.5 mg by mouth 3 (three) times daily. At 9am, 3pm,  and 9pm         . ADULT MULTIVITAMIN W/MINERALS CH   Oral   Take 1 tablet by mouth every morning.         . SUCRALFATE 1 GM/10ML PO SUSP   Oral   Take 1 g by mouth 4 (four) times daily -  with meals and at bedtime. At 8:30am, 11:30am, 4:30pm, and 9pm         . VITAMIN B-12 1000 MCG PO TABS   Oral   Take 1,000 mcg by mouth every other day.         Marland Kitchen LORAZEPAM 2 MG/ML IJ SOLN   Intramuscular   Inject 1 mg into the muscle once. For dementia related agitation           BP 139/47  Pulse 86  Temp 97.9 F (36.6 C) (Oral)  Resp 18  SpO2 100%  Physical Exam  Nursing note and vitals reviewed. Constitutional: She appears well-developed and well-nourished.  HENT:  Head: Normocephalic and atraumatic.  Eyes: EOM are normal. No scleral icterus.  Neck: Trachea normal, normal range of motion and full passive range of motion without pain. Neck supple. No spinous process tenderness and no muscular tenderness present.  Cardiovascular: Normal rate and regular rhythm.   Pulmonary/Chest: Effort normal. No stridor. No respiratory distress. She has no wheezes.  Abdominal: Soft. She exhibits no distension. There is no tenderness.  Musculoskeletal: She exhibits no tenderness.       Right shoulder: She exhibits normal range of motion and no tenderness.       Left shoulder: She exhibits normal range of motion and no tenderness.       Right hip: She exhibits no tenderness and no bony tenderness.       Left hip: She exhibits no tenderness and no bony tenderness.       Right ankle: Normal.       Left ankle: Normal.  Cervical back: She exhibits no tenderness, no bony tenderness and normal pulse.       Thoracic back: Normal.       Lumbar back: She exhibits no tenderness and no bony tenderness.       Several old healing abrasions on both elbows, hands  Neurological: She is alert. She exhibits normal muscle tone.  Skin: Skin is warm and dry.    ED Course  Procedures (including  critical care time)  Labs Reviewed - No data to display Ct Head Wo Contrast  01/10/2012  *RADIOLOGY REPORT*  Clinical Data: Fall  CT HEAD WITHOUT CONTRAST  Technique:  Contiguous axial images were obtained from the base of the skull through the vertex without contrast.  Comparison: 12/24/2011 MRI  Findings: Prominence of the sulci, cisterns, and ventricles, in keeping with volume loss. There are subcortical and periventricular white matter hypodensities, a nonspecific finding most often seen with chronic microangiopathic changes.  There is no evidence for acute hemorrhage, overt hydrocephalus, mass lesion, or abnormal extra-axial fluid collection.  No definite CT evidence for acute cortical based (large artery) infarction. The visualized paranasal sinuses and mastoid air cells are predominately clear. Mild residual of a prior right frontal scalp hematoma.  IMPRESSION: Volume loss and white matter changes without definite CT evidence for acute intracranial abnormality.   Original Report Authenticated By: Jearld Lesch, M.D.      1. Falls frequently      RA sat is 100% and I interpret to be normal.  11:57 PM Head CT shows no acute abn's.  I reviewed images and radiologist interpretation myself.   Pt is stable for discharge back to facility.  MDM  Pt with recurrent falls . Pt had labs drawn 3 days ago, had UA and CXR done in the ED.  No sig abn's.  Will get head CT since pt complained of some head pain to me, to r/o mass effect, sig traumatic injury.  If neg, pt is at baseline mentation based on staff description and report from EMS, will discharge back to facility.          Gavin Pound. Oletta Lamas, MD 01/10/12 2358

## 2012-01-10 NOTE — Discharge Instructions (Signed)
Fall Prevention, Elderly Falls are the leading cause of injuries, accidents, and accidental deaths in people over the age of 65. Falling is a real threat to your ability to live on your own. CAUSES    Poor eyesight or poor hearing can make you more likely to fall.   Illnesses and physical conditions can affect your strength and balance.   Poor lighting, throw rugs and pets in your home can make you more likely to trip or slip.   The side effects of some medicines can upset your balance and lead to falling. These include medicines for depression, sleep problems, high blood pressure, diabetes, and heart conditions.  PREVENTION   Be sure your home is as safe as possible. Here are some tips:  Wear shoes with non-skid soles (not house slippers).   Be sure your home and outside area are well lit.   Use night lights throughout your house, including hallways and stairways.   Remove clutter and clean up spills on floors and walkways.   Remove throw rugs or fasten them to the floor with carpet tape. Tack down carpet edges.   Do not place electrical cords across pathways.   Install grab bars in your bathtub, shower, and toilet area. Towel bars should not be used as a grab bar.   Install handrails on both sides of stairways.   Do not climb on stools or stepladders. Get someone else to help with jobs that require climbing.   Do not wax your floors at all, or use a non-skid wax.   Repair uneven or unsafe sidewalks, walkways or stairs.   Keep frequently used items within reach.   Be aware of pets so you do not trip.  Get regular check-ups from your doctor, and take good care of yourself:  Have your eyes checked every year for vision changes, cataracts, glaucoma, and other eye problems. Wear eyeglasses as directed.   Have your hearing checked every 2 years, or anytime you or others think that you cannot hear well. Use hearing aids as directed.   See your caregiver if you have foot pain  or corns. Sore feet can contribute to falls.   Let your caregiver know if a medicine is making you feel dizzy or making you lose your balance.   Use a cane, walker, or wheelchair as directed. Use walker or wheelchair brakes when getting in and out.   When you get up from bed, sit on the side of the bed for 1 to 2 minutes before you stand up. This will give your blood pressure time to adjust, and you will feel less dizzy.   If you need to go to the bathroom often, consider using a bedside commode.  Keep your body in good shape:  Get regular exercise, especially walking.   Do exercises to strengthen the muscles you use for walking and lifting.   Do not smoke.   Minimize use of alcohol.  SEEK IMMEDIATE MEDICAL CARE IF:    You feel dizzy, weak, or unsteady on your feet.   You feel confused.   You fall.  Document Released: 01/08/2005 Document Revised: 12/28/2010 Document Reviewed: 07/05/2006 [employer] Patient Information 2012 [employer]. 

## 2012-04-06 ENCOUNTER — Encounter (HOSPITAL_COMMUNITY): Payer: Self-pay | Admitting: Internal Medicine

## 2012-04-06 ENCOUNTER — Inpatient Hospital Stay (HOSPITAL_COMMUNITY)
Admission: EM | Admit: 2012-04-06 | Discharge: 2012-04-10 | DRG: 871 | Disposition: A | Discharge status: Skilled Nursing Facility | Payer: Medicare Other | Attending: Internal Medicine | Admitting: Internal Medicine

## 2012-04-06 ENCOUNTER — Emergency Department (HOSPITAL_COMMUNITY): Payer: Medicare Other

## 2012-04-06 ENCOUNTER — Inpatient Hospital Stay (HOSPITAL_COMMUNITY): Payer: Medicare Other

## 2012-04-06 DIAGNOSIS — F0390 Unspecified dementia without behavioral disturbance: Secondary | ICD-10-CM

## 2012-04-06 DIAGNOSIS — F0280 Dementia in other diseases classified elsewhere without behavioral disturbance: Secondary | ICD-10-CM | POA: Diagnosis present

## 2012-04-06 DIAGNOSIS — R42 Dizziness and giddiness: Secondary | ICD-10-CM

## 2012-04-06 DIAGNOSIS — F02818 Dementia in other diseases classified elsewhere, unspecified severity, with other behavioral disturbance: Secondary | ICD-10-CM | POA: Diagnosis present

## 2012-04-06 DIAGNOSIS — F3289 Other specified depressive episodes: Secondary | ICD-10-CM | POA: Diagnosis present

## 2012-04-06 DIAGNOSIS — I2510 Atherosclerotic heart disease of native coronary artery without angina pectoris: Secondary | ICD-10-CM

## 2012-04-06 DIAGNOSIS — Z9049 Acquired absence of other specified parts of digestive tract: Secondary | ICD-10-CM

## 2012-04-06 DIAGNOSIS — G9340 Encephalopathy, unspecified: Secondary | ICD-10-CM

## 2012-04-06 DIAGNOSIS — E876 Hypokalemia: Secondary | ICD-10-CM | POA: Diagnosis present

## 2012-04-06 DIAGNOSIS — F329 Major depressive disorder, single episode, unspecified: Secondary | ICD-10-CM | POA: Diagnosis present

## 2012-04-06 DIAGNOSIS — R0603 Acute respiratory distress: Secondary | ICD-10-CM

## 2012-04-06 DIAGNOSIS — R509 Fever, unspecified: Secondary | ICD-10-CM

## 2012-04-06 DIAGNOSIS — I10 Essential (primary) hypertension: Secondary | ICD-10-CM

## 2012-04-06 DIAGNOSIS — E860 Dehydration: Secondary | ICD-10-CM

## 2012-04-06 DIAGNOSIS — I739 Peripheral vascular disease, unspecified: Secondary | ICD-10-CM | POA: Diagnosis present

## 2012-04-06 DIAGNOSIS — J690 Pneumonitis due to inhalation of food and vomit: Secondary | ICD-10-CM | POA: Diagnosis present

## 2012-04-06 DIAGNOSIS — K219 Gastro-esophageal reflux disease without esophagitis: Secondary | ICD-10-CM

## 2012-04-06 DIAGNOSIS — E785 Hyperlipidemia, unspecified: Secondary | ICD-10-CM | POA: Diagnosis present

## 2012-04-06 DIAGNOSIS — F32A Depression, unspecified: Secondary | ICD-10-CM

## 2012-04-06 DIAGNOSIS — Z66 Do not resuscitate: Secondary | ICD-10-CM | POA: Diagnosis not present

## 2012-04-06 DIAGNOSIS — R1310 Dysphagia, unspecified: Secondary | ICD-10-CM

## 2012-04-06 DIAGNOSIS — G309 Alzheimer's disease, unspecified: Secondary | ICD-10-CM | POA: Diagnosis present

## 2012-04-06 DIAGNOSIS — J189 Pneumonia, unspecified organism: Secondary | ICD-10-CM

## 2012-04-06 DIAGNOSIS — A419 Sepsis, unspecified organism: Principal | ICD-10-CM

## 2012-04-06 DIAGNOSIS — R000 Tachycardia, unspecified: Secondary | ICD-10-CM

## 2012-04-06 DIAGNOSIS — J9600 Acute respiratory failure, unspecified whether with hypoxia or hypercapnia: Secondary | ICD-10-CM | POA: Diagnosis present

## 2012-04-06 DIAGNOSIS — Z79899 Other long term (current) drug therapy: Secondary | ICD-10-CM

## 2012-04-06 DIAGNOSIS — Z85038 Personal history of other malignant neoplasm of large intestine: Secondary | ICD-10-CM

## 2012-04-06 DIAGNOSIS — Z87891 Personal history of nicotine dependence: Secondary | ICD-10-CM

## 2012-04-06 LAB — URINALYSIS, ROUTINE W REFLEX MICROSCOPIC
Bilirubin Urine: NEGATIVE
Glucose, UA: NEGATIVE mg/dL
Hgb urine dipstick: NEGATIVE
Ketones, ur: 15 mg/dL — AB
pH: 7 (ref 5.0–8.0)

## 2012-04-06 LAB — CBC WITH DIFFERENTIAL/PLATELET
Basophils Relative: 0 % (ref 0–1)
Hemoglobin: 12.3 g/dL (ref 12.0–15.0)
Lymphocytes Relative: 8 % — ABNORMAL LOW (ref 12–46)
Lymphs Abs: 0.8 10*3/uL (ref 0.7–4.0)
MCHC: 31.9 g/dL (ref 30.0–36.0)
Monocytes Relative: 7 % (ref 3–12)
Neutro Abs: 8.2 10*3/uL — ABNORMAL HIGH (ref 1.7–7.7)
Neutrophils Relative %: 85 % — ABNORMAL HIGH (ref 43–77)
RBC: 4.45 MIL/uL (ref 3.87–5.11)
WBC: 9.7 10*3/uL (ref 4.0–10.5)

## 2012-04-06 LAB — TROPONIN I: Troponin I: <0.3 ng/mL (ref <0.30)

## 2012-04-06 LAB — COMPREHENSIVE METABOLIC PANEL
AST: 61 U/L — ABNORMAL HIGH (ref 0–37)
Albumin: 3.7 g/dL (ref 3.5–5.2)
Alkaline Phosphatase: 69 U/L (ref 39–117)
BUN: 22 mg/dL (ref 6–23)
Chloride: 97 mEq/L (ref 96–112)
Potassium: 3.5 mEq/L (ref 3.5–5.1)
Total Bilirubin: 0.8 mg/dL (ref 0.3–1.2)

## 2012-04-06 LAB — APTT: aPTT: 47 seconds — ABNORMAL HIGH (ref 24–37)

## 2012-04-06 LAB — MRSA PCR SCREENING: MRSA by PCR: NEGATIVE

## 2012-04-06 MED ORDER — FERROUS SULFATE 325 (65 FE) MG PO TABS
325.0000 mg | ORAL_TABLET | Freq: Every day | ORAL | Status: DC
  Administered 2012-04-07 – 2012-04-10 (×4): 325 mg via ORAL
  Filled 2012-04-06 (×5): qty 1

## 2012-04-06 MED ORDER — ACETAMINOPHEN 325 MG PO TABS
650.0000 mg | ORAL_TABLET | ORAL | Status: DC | PRN
  Administered 2012-04-07 (×3): 650 mg via ORAL
  Filled 2012-04-06 (×3): qty 2

## 2012-04-06 MED ORDER — ONDANSETRON HCL 4 MG/2ML IJ SOLN: 4.0000 mg | Freq: Four times a day (QID) | INTRAMUSCULAR | Status: DC | PRN

## 2012-04-06 MED ORDER — ACETAMINOPHEN 325 MG PO TABS
650.0000 mg | ORAL_TABLET | Freq: Once | ORAL | Status: AC
  Administered 2012-04-06: 650 mg via ORAL
  Filled 2012-04-06: qty 3

## 2012-04-06 MED ORDER — OSELTAMIVIR PHOSPHATE 75 MG PO CAPS
75.0000 mg | ORAL_CAPSULE | Freq: Two times a day (BID) | ORAL | Status: DC
  Administered 2012-04-06 – 2012-04-07 (×2): 75 mg via ORAL
  Filled 2012-04-06 (×3): qty 1

## 2012-04-06 MED ORDER — LEVOFLOXACIN IN D5W 500 MG/100ML IV SOLN
500.0000 mg | INTRAVENOUS | Status: DC
  Administered 2012-04-06: 500 mg via INTRAVENOUS
  Filled 2012-04-06: qty 100

## 2012-04-06 MED ORDER — SODIUM CHLORIDE 0.9 % IV BOLUS (SEPSIS)
1000.0000 mL | Freq: Once | INTRAVENOUS | Status: AC
  Administered 2012-04-06: 1000 mL via INTRAVENOUS

## 2012-04-06 MED ORDER — ALBUTEROL SULFATE (5 MG/ML) 0.5% IN NEBU
2.5000 mg | INHALATION_SOLUTION | Freq: Once | RESPIRATORY_TRACT | Status: AC
  Administered 2012-04-06: 2.5 mg via RESPIRATORY_TRACT
  Filled 2012-04-06: qty 0.5

## 2012-04-06 MED ORDER — DIVALPROEX SODIUM 125 MG PO CPSP
125.0000 mg | ORAL_CAPSULE | Freq: Two times a day (BID) | ORAL | Status: DC
  Administered 2012-04-06 – 2012-04-10 (×8): 125 mg via ORAL
  Filled 2012-04-06 (×10): qty 1

## 2012-04-06 MED ORDER — LORAZEPAM 0.5 MG PO TABS
0.2500 mg | ORAL_TABLET | Freq: Three times a day (TID) | ORAL | Status: DC
  Administered 2012-04-06 – 2012-04-07 (×2): 0.25 mg via ORAL
  Filled 2012-04-06 (×3): qty 1

## 2012-04-06 MED ORDER — LEVOFLOXACIN IN D5W 750 MG/150ML IV SOLN
750.0000 mg | INTRAVENOUS | Status: AC
  Administered 2012-04-08: 750 mg via INTRAVENOUS
  Filled 2012-04-06: qty 150

## 2012-04-06 MED ORDER — ASPIRIN 81 MG PO TABS: 81.0000 mg | ORAL_TABLET | Freq: Every day | ORAL | Status: DC

## 2012-04-06 MED ORDER — DULOXETINE HCL 60 MG PO CPEP
60.0000 mg | ORAL_CAPSULE | Freq: Every morning | ORAL | Status: DC
  Administered 2012-04-07 – 2012-04-10 (×4): 60 mg via ORAL
  Filled 2012-04-06 (×4): qty 1

## 2012-04-06 MED ORDER — IOHEXOL 350 MG/ML SOLN
100.0000 mL | Freq: Once | INTRAVENOUS | Status: AC | PRN
  Administered 2012-04-06: 100 mL via INTRAVENOUS

## 2012-04-06 MED ORDER — BIOTENE DRY MOUTH MT LIQD
15.0000 mL | Freq: Two times a day (BID) | OROMUCOSAL | Status: DC
  Administered 2012-04-07 – 2012-04-10 (×6): 15 mL via OROMUCOSAL

## 2012-04-06 MED ORDER — IPRATROPIUM BROMIDE 0.02 % IN SOLN
0.5000 mg | Freq: Once | RESPIRATORY_TRACT | Status: AC
  Administered 2012-04-06: 0.5 mg via RESPIRATORY_TRACT
  Filled 2012-04-06: qty 2.5

## 2012-04-06 MED ORDER — LORAZEPAM 0.5 MG PO TABS
0.2500 mg | ORAL_TABLET | Freq: Once | ORAL | Status: AC
  Administered 2012-04-06: 0.25 mg via ORAL

## 2012-04-06 MED ORDER — LISINOPRIL 10 MG PO TABS: 10.0000 mg | ORAL_TABLET | Freq: Every morning | ORAL | Status: DC

## 2012-04-06 MED ORDER — GUAIFENESIN ER 600 MG PO TB12
1200.0000 mg | ORAL_TABLET | Freq: Two times a day (BID) | ORAL | Status: DC
  Administered 2012-04-06 – 2012-04-10 (×8): 1200 mg via ORAL
  Filled 2012-04-06 (×9): qty 2

## 2012-04-06 MED ORDER — DONEPEZIL HCL 10 MG PO TABS
10.0000 mg | ORAL_TABLET | Freq: Every day | ORAL | Status: DC
  Administered 2012-04-06 – 2012-04-09 (×4): 10 mg via ORAL
  Filled 2012-04-06 (×5): qty 1

## 2012-04-06 MED ORDER — IBUPROFEN 400 MG PO TABS
400.0000 mg | ORAL_TABLET | Freq: Four times a day (QID) | ORAL | Status: DC | PRN
  Filled 2012-04-06: qty 1

## 2012-04-06 MED ORDER — IPRATROPIUM BROMIDE 0.02 % IN SOLN: 0.5000 mg | RESPIRATORY_TRACT | Status: DC | PRN

## 2012-04-06 MED ORDER — ENOXAPARIN SODIUM 40 MG/0.4ML ~~LOC~~ SOLN
40.0000 mg | SUBCUTANEOUS | Status: DC
Start: 2012-04-06 — End: 2012-04-10
  Administered 2012-04-06 – 2012-04-09 (×4): 40 mg via SUBCUTANEOUS
  Filled 2012-04-06 (×5): qty 0.4

## 2012-04-06 MED ORDER — ALBUTEROL SULFATE (5 MG/ML) 0.5% IN NEBU: 2.5000 mg | INHALATION_SOLUTION | RESPIRATORY_TRACT | Status: AC | PRN

## 2012-04-06 MED ORDER — VANCOMYCIN HCL 500 MG IV SOLR
500.0000 mg | Freq: Two times a day (BID) | INTRAVENOUS | Status: DC
  Administered 2012-04-07 – 2012-04-08 (×4): 500 mg via INTRAVENOUS
  Filled 2012-04-06 (×4): qty 500

## 2012-04-06 MED ORDER — ATORVASTATIN CALCIUM 10 MG PO TABS
5.0000 mg | ORAL_TABLET | Freq: Every day | ORAL | Status: DC
  Administered 2012-04-06: 22:00:00 via ORAL
  Administered 2012-04-07 – 2012-04-09 (×3): 5 mg via ORAL
  Filled 2012-04-06 (×6): qty 0.5

## 2012-04-06 MED ORDER — SODIUM CHLORIDE 0.9 % IV SOLN
INTRAVENOUS | Status: DC
  Administered 2012-04-06: 1000 mL via INTRAVENOUS
  Administered 2012-04-09: 17:00:00 via INTRAVENOUS

## 2012-04-06 MED ORDER — PIPERACILLIN-TAZOBACTAM 3.375 G IVPB
3.3750 g | Freq: Once | INTRAVENOUS | Status: DC
  Filled 2012-04-06: qty 50

## 2012-04-06 MED ORDER — DEXTROSE 5 % IV SOLN
1.0000 g | INTRAVENOUS | Status: DC
  Administered 2012-04-06 – 2012-04-09 (×4): 1 g via INTRAVENOUS
  Filled 2012-04-06 (×4): qty 1

## 2012-04-06 MED ORDER — SODIUM CHLORIDE 0.9 % IV SOLN: INTRAVENOUS | Status: DC

## 2012-04-06 MED ORDER — ASPIRIN 81 MG PO CHEW
81.0000 mg | CHEWABLE_TABLET | Freq: Every day | ORAL | Status: DC
  Administered 2012-04-07 – 2012-04-10 (×4): 81 mg via ORAL
  Filled 2012-04-06 (×4): qty 1

## 2012-04-06 MED ORDER — PANTOPRAZOLE SODIUM 40 MG PO TBEC
40.0000 mg | DELAYED_RELEASE_TABLET | Freq: Every day | ORAL | Status: DC
  Administered 2012-04-07 – 2012-04-10 (×4): 40 mg via ORAL
  Filled 2012-04-06 (×4): qty 1

## 2012-04-06 MED ORDER — ONDANSETRON HCL 4 MG/2ML IJ SOLN: 4.0000 mg | Freq: Three times a day (TID) | INTRAMUSCULAR | Status: DC | PRN

## 2012-04-06 MED ORDER — VITAMIN B-12 1000 MCG PO TABS
1000.0000 ug | ORAL_TABLET | ORAL | Status: DC
  Administered 2012-04-06 – 2012-04-10 (×3): 1000 ug via ORAL
  Filled 2012-04-06 (×3): qty 1

## 2012-04-06 MED ORDER — SUCRALFATE 1 GM/10ML PO SUSP
1.0000 g | Freq: Three times a day (TID) | ORAL | Status: DC
  Administered 2012-04-06 – 2012-04-10 (×14): 1 g via ORAL
  Filled 2012-04-06 (×18): qty 10

## 2012-04-06 MED ORDER — VANCOMYCIN HCL IN DEXTROSE 1-5 GM/200ML-% IV SOLN
1000.0000 mg | Freq: Once | INTRAVENOUS | Status: AC
  Administered 2012-04-06: 1000 mg via INTRAVENOUS
  Filled 2012-04-06: qty 200

## 2012-04-06 NOTE — H&P (Signed)
[facility] History and Physical  [name] JYN:[id number] DOB: Oct 02, 1922 DOA: 04/06/2012  Referring physician: Dr Manus Gunning PCP: [name], MD  Specialists:   Chief Complaint: SOB/Hypoxia  HPI: [name] is a 79 y.o. female with history of hypertension, depression, dementia, history of colon cancer status post right colectomy, status post cholecystectomy who is a resident at assisted living facility presented to the ED per notes from the facility with a one-day history of worsening shortness of breath, hypoxia with sats of 85%, shortness of breath. On interview the patient patient states she's had a productive cough over the past month and shortness of breath over the past one to 2 months. Patient endorses some chills. Patient endorses some subjective fevers. Patient endorses some dysuria, constipation, generalized weakness. Patient endorses decreased oral intake. Patient denies any chest pain, no nausea, no vomiting, no abdominal pain. Patient was seen in the ED was noted to have a temperature of 104, heart rate in the 120s, respiratory rate in the 20s, systolic blood pressure in the 130s. Chest x-ray which was obtained was negative for any acute infiltrate. Urinalysis which was done was unremarkable. CBC which was done had a left shift otherwise was unremarkable. Lactic acid level done was 1.4. Basic metabolic profile done was unremarkable hepatic panel did have a elevated AST and ALT. Initial troponin obtained was negative. EKG showed a sinus tachycardia however did have some T wave inversions in leads 1 aVL and V4. Blood cultures were obtained and patient was placed empirically on IV vancomycin, and Zosyn  And Levaquin. We were called to admit the patient.  Review of Systems: The patient denies anorexia, fever, weight loss,, vision loss, decreased hearing, hoarseness, chest pain, syncope, dyspnea on exertion, peripheral edema, balance deficits, hemoptysis, abdominal pain, melena,  hematochezia, severe indigestion/heartburn, hematuria, incontinence, genital sores, muscle weakness, suspicious skin lesions, transient blindness, difficulty walking, depression, unusual weight change, abnormal bleeding, enlarged lymph nodes, angioedema, and breast masses.    Past Medical History  Diagnosis Date  . Diverticulosis of colon (without mention of hemorrhage) 06/24/2006  . Carcinoma of cecum 1992    History of  . Vitamin B12 deficiency   . Alzheimer's disease   . HTN (hypertension)   . Depression   . HLD (hyperlipidemia)   . Osteoporosis, unspecified   . PVD (peripheral vascular disease)   . CAD (coronary artery disease)   . GERD (gastroesophageal reflux disease)   . H. pylori infection 2004  . Presbyesophagus    Past Surgical History  Procedure Laterality Date  . Gallbladder surgery    . Abdominal hysterectomy  1995    with bso  . Right colectomy    . Cataract extraction  2001   Social History:  reports that she has quit smoking. She has never used smokeless tobacco. She reports that she does not drink alcohol or use illicit drugs.  No Known Allergies  Family History  Problem Relation Age of Onset  . Heart disease Mother   . Colon cancer Neg Hx     Prior to Admission medications   Medication Sig Start Date End Date Taking? Authorizing Provider  acetaminophen (TYLENOL) 325 MG tablet Take 650 mg by mouth 3 (three) times daily. At 9am, 3pm, and 9pm   Yes Historical Provider, MD  alendronate (FOSAMAX) 70 MG tablet Take 70 mg by mouth every 7 (seven) days. Take on Saturdays with a full glass of water on an empty stomach.   Yes Historical Provider, MD  aspirin  81 MG tablet Take 81 mg by mouth daily.   Yes Historical Provider, MD  atorvastatin (LIPITOR) 10 MG tablet Take 5 mg by mouth at bedtime.   Yes Historical Provider, MD  Calcium-Vitamin D 600-200 MG-UNIT per tablet Take 1 tablet by mouth every morning.    Yes Historical Provider, MD  cholecalciferol (VITAMIN D)  400 UNITS TABS Take 400 Units by mouth every morning.    Yes Historical Provider, MD  divalproex (DEPAKOTE SPRINKLE) 125 MG capsule Take 125 mg by mouth 2 (two) times daily. At 9am and 9pm   Yes Historical Provider, MD  donepezil (ARICEPT) 10 MG tablet Take 10 mg by mouth at bedtime.   Yes Historical Provider, MD  DULoxetine (CYMBALTA) 60 MG capsule Take 60 mg by mouth every morning.    Yes Historical Provider, MD  ferrous sulfate 325 (65 FE) MG tablet Take 325 mg by mouth daily with breakfast.   Yes Historical Provider, MD  hydrochlorothiazide (MICROZIDE) 12.5 MG capsule Take 12.5 mg by mouth every morning.    Yes Historical Provider, MD  lisinopril (PRINIVIL,ZESTRIL) 10 MG tablet Take 10 mg by mouth every morning.   Yes Historical Provider, MD  LORazepam (ATIVAN) 0.5 MG tablet Take 0.25 mg by mouth 3 (three) times daily. At 9am, 3pm, and 9pm 12/28/11  Yes Ripudeep Jenna Luo, MD  Multiple Vitamin (MULTIVITAMIN WITH MINERALS) TABS Take 1 tablet by mouth every morning.   Yes Historical Provider, MD  sucralfate (CARAFATE) 1 GM/10ML suspension Take 1 g by mouth 4 (four) times daily -  with meals and at bedtime. At 8:30am, 11:30am, 4:30pm, and 9pm   Yes Historical Provider, MD  vitamin B-12 (CYANOCOBALAMIN) 1000 MCG tablet Take 1,000 mcg by mouth every other day.   Yes Historical Provider, MD   Physical Exam: Filed Vitals:   04/06/12 1334 04/06/12 1340 04/06/12 1517 04/06/12 1540  BP:  172/64 137/40   Pulse:  114 120   Temp:  104 F (40 C) 103.3 F (39.6 C) 103.8 F (39.9 C)  TempSrc:  Rectal Other (Comment) Other (Comment)  Resp:  24 22   SpO2: 96% 93% 91%      General:  Alert, well-developed well-nourished, in no acute cardiopulmonary distress.  Eyes: Pupils equal round and reactive to light and accommodation. Extraocular movements intact.  ENT: Oropharynx is clear, no lesions, no exudates. Extremely dry mucous membranes.  Neck: Supple with no lymphadenopathy.  Cardiovascular:  Tachycardic, regular rhythm. No murmurs rubs or gallops. No JVD. No lower extremity edema.  Respiratory: Coarse, diffuse, rhonchorous breath sounds.  Abdomen: Soft, nontender, nondistended, positive bowel sounds.  Skin: No rashes or lesions.  Musculoskeletal: 4/5 BUE strength, 4/5 BLE strength  Psychiatric: Normal mood. Normal affect. Poor to fair insight. Portal for adjustment.  Neurologic: Alert to self only. Cranial nebs 2 through 12 are grossly intact. No focal deficits. Gait not tested secondary to safety.  Labs on Admission:  Basic Metabolic Panel:  Recent Labs Lab 04/06/12 1430  NA 137  K 3.5  CL 97  CO2 27  GLUCOSE 110*  BUN 22  CREATININE 0.81  CALCIUM 9.4   Liver Function Tests:  Recent Labs Lab 04/06/12 1430  AST 61*  ALT 41*  ALKPHOS 69  BILITOT 0.8  PROT 6.9  ALBUMIN 3.7   No results found for this basename: LIPASE, AMYLASE,  in the last 168 hours No results found for this basename: AMMONIA,  in the last 168 hours CBC:  Recent Labs Lab 04/06/12 1430  WBC 9.7  NEUTROABS 8.2*  HGB 12.3  HCT 38.5  MCV 86.5  PLT 176   Cardiac Enzymes:  Recent Labs Lab 04/06/12 1430  TROPONINI <0.30    BNP (last 3 results) No results found for this basename: PROBNP,  in the last 8760 hours CBG: No results found for this basename: GLUCAP,  in the last 168 hours  Radiological Exams on Admission: Dg Chest Portable 1 View  04/06/2012  *RADIOLOGY REPORT*  Clinical Data: Short of breath.  Weakness.  Altered mental status.  PORTABLE CHEST - 1 VIEW  Comparison: 01/07/2012  Findings: Artifact overlies the chest.  Heart size is normal. There is calcification of the aorta. Brachiocephalic vessels are somewhat unfolded.  There are chronic interstitial lung markings but no sign of active infiltrate, mass, effusion or collapse.  No acute bony finding.  IMPRESSION: Chronic lung markings.  No active disease suspected.   Original Report Authenticated By: Paulina Fusi, M.D.      EKG: Sinus tachycardia with T wave inversions in leads 1, aVL, V4.  Assessment/Plan Principal Problem:   Sepsis Active Problems:   Dysphagia   GERD (gastroesophageal reflux disease)   HTN (hypertension)   Dementia   Acute respiratory distress   Fever   Dehydration   Sinus tachycardia   Depression   CAD (coronary artery disease)    #1 sepsis Questionable etiology. Likely secondary to probable healthcare associated pneumonia. Patient with upper respiratory symptoms with coarse rhonchorous breath sounds on exam. Patient noted to have a left shift on CBC with differential. Patient noted to have a temp of 104. Patient also noted to be hypoxic with sats of 85% on room air prior to admission. Patient also noted to be tachycardic with heart rates in the 120s and tachycardia and tachypnic with a respiratory rate in the 20s. Will check a pro calcitonin level. Will check an influenza PCR. Will check blood cultures x2. Check a sputum Gram stain and culture. Urinalysis is negative for UTI. Will treat empirically with IV vancomycin, cefepime, Levaquin. Will place on Mucinex. IV fluids. Place empirically on Tamiflu. Supportive care.  #2 probable healthcare associated pneumonia Patient is present with upper rest for his symptoms of a productive cough, fevers with him subcutaneous 104, hypoxia with sats of 85% on room air prior to admission at her assisted living facility, increased respiratory rate, tachycardic. Will check a sputum Gram stain and culture. Will check an  influenza PCR Place so apparently on IV vancomycin, cefepime, Levaquin, Mucinex. Nebs as needed. Place on Tamiflu empirically.  #3 fever Likely secondary to problem #2. Urinalysis is negative. Chest x-ray was unremarkable. CT of the chest pending. Blood cultures pending. Continue empiric treatment with IV vancomycin, cefepime, Levaquin. Tylenol as needed.  #4 acute respiratory distress Likely secondary to problems #1 and 2. Sputum  Gram stain and cultures are pending. Blood cultures are pending. Urinalysis is negative. We'll place on oxygen, IV vancomycin, IV Levaquin, IV cefepime. Place on oxygen. Nebs as needed. Follow. If patient tolerates will check ABG and may need the BiPAP. Follow.  #5 dementia Stable. Continue home regimen of Aricept.  #6 gastroesophageal reflux disease PPI.  #7 dysphagia Will place on a dysphagia 3 diet as she was doing her facility. Will consult with speech therapy for further evaluation and management.  #8 hypertension Blood pressure stable. Will hold diuretics. Continue ACE inhibitor.  #9 coronary artery disease Stable. Will cycle cardiac enzymes. Continue ACE inhibitor. Continue aspirin. Continue Lipitor.  #10 depression Stable. Continue  Cymbalta and Depakote.  #11 prophylaxis PPI for GI prophylaxis Lovenox for DVT prophylaxis.  Code Status: FULL Family Communication: Updated patient no family at bedside. Disposition Plan: Admit to ICU  Time spent: 70 mins  [facility] Pager [phone]  If 7PM-7AM, please contact night-coverage [email or web address] Password [facility] 04/06/2012, 4:31 PM

## 2012-04-06 NOTE — Progress Notes (Signed)
20:10 Pt is very friendly but highly frigidity, she removed her foley anchor twice. I have given her a activity apron for a diversion.  20:05 Pt removed her Left AC IV, the Left hand IV is wrapped in kerlex. Pt now has mittens on. If it appears that they aggravate her I will remove.

## 2012-04-06 NOTE — ED Provider Notes (Signed)
History     CSN: [id number]  Arrival date & time 04/06/12  1332   First MD Initiated Contact with Patient 04/06/12 1340      Chief Complaint  Patient presents with  . Shortness of Breath    (Consider location/radiation/quality/duration/timing/severity/associated sxs/prior treatment) HPI Comments: Patient presents from nursing home with a one-day history of shortness of breath, fever and cough. She is severely demented and unable to provide a history. She is to tachypneic, tachycardic and febrile or rhonchorous respirations. She denies any pain.  The history is provided by the patient.    Past Medical History  Diagnosis Date  . Diverticulosis of colon (without mention of hemorrhage) 06/24/2006  . Carcinoma of cecum 1992    History of  . Vitamin B12 deficiency   . Alzheimer's disease   . HTN (hypertension)   . Depression   . HLD (hyperlipidemia)   . Osteoporosis, unspecified   . PVD (peripheral vascular disease)   . CAD (coronary artery disease)   . GERD (gastroesophageal reflux disease)   . H. pylori infection 2004  . Presbyesophagus     Past Surgical History  Procedure Laterality Date  . Gallbladder surgery    . Abdominal hysterectomy  1995    with bso  . Right colectomy    . Cataract extraction  2001    Family History  Problem Relation Age of Onset  . Heart disease Mother   . Colon cancer Neg Hx     History  Substance Use Topics  . Smoking status: Former Games developer  . Smokeless tobacco: Never Used  . Alcohol Use: No    OB History   Grav Para Term Preterm Abortions TAB SAB Ect Mult Living                  Review of Systems  Unable to perform ROS: Dementia  Respiratory: Positive for shortness of breath.     Allergies  Review of patient's allergies indicates no known allergies.  Home Medications   Current Outpatient Rx  Name  Route  Sig  Dispense  Refill  . acetaminophen (TYLENOL) 325 MG tablet   Oral   Take 650 mg by mouth 3 (three) times  daily. At 9am, 3pm, and 9pm         . alendronate (FOSAMAX) 70 MG tablet   Oral   Take 70 mg by mouth every 7 (seven) days. Take on Saturdays with a full glass of water on an empty stomach.         Marland Kitchen aspirin 81 MG tablet   Oral   Take 81 mg by mouth daily.         Marland Kitchen atorvastatin (LIPITOR) 10 MG tablet   Oral   Take 5 mg by mouth at bedtime.         . Calcium-Vitamin D 600-200 MG-UNIT per tablet   Oral   Take 1 tablet by mouth every morning.          . cholecalciferol (VITAMIN D) 400 UNITS TABS   Oral   Take 400 Units by mouth every morning.          . divalproex (DEPAKOTE SPRINKLE) 125 MG capsule   Oral   Take 125 mg by mouth 2 (two) times daily. At 9am and 9pm         . donepezil (ARICEPT) 10 MG tablet   Oral   Take 10 mg by mouth at bedtime.         Marland Kitchen  DULoxetine (CYMBALTA) 60 MG capsule   Oral   Take 60 mg by mouth every morning.          . ferrous sulfate 325 (65 FE) MG tablet   Oral   Take 325 mg by mouth daily with breakfast.         . hydrochlorothiazide (MICROZIDE) 12.5 MG capsule   Oral   Take 12.5 mg by mouth every morning.          Marland Kitchen lisinopril (PRINIVIL,ZESTRIL) 10 MG tablet   Oral   Take 10 mg by mouth every morning.         Marland Kitchen LORazepam (ATIVAN) 0.5 MG tablet   Oral   Take 0.25 mg by mouth 3 (three) times daily. At 9am, 3pm, and 9pm         . Multiple Vitamin (MULTIVITAMIN WITH MINERALS) TABS   Oral   Take 1 tablet by mouth every morning.         . sucralfate (CARAFATE) 1 GM/10ML suspension   Oral   Take 1 g by mouth 4 (four) times daily -  with meals and at bedtime. At 8:30am, 11:30am, 4:30pm, and 9pm         . vitamin B-12 (CYANOCOBALAMIN) 1000 MCG tablet   Oral   Take 1,000 mcg by mouth every other day.           BP 137/40  Pulse 120  Temp(Src) 103.8 F (39.9 C) (Other (Comment))  Resp 22  SpO2 91%  Physical Exam  Constitutional: She appears well-developed and well-nourished. She appears distressed.   HENT:  Head: Normocephalic and atraumatic.  Mouth/Throat: Oropharynx is clear and moist. No oropharyngeal exudate.  Eyes: Conjunctivae and EOM are normal. Pupils are equal, round, and reactive to light.  Neck: Normal range of motion. Neck supple.  Cardiovascular: Normal rate, regular rhythm and normal heart sounds.   Tachycardic  Pulmonary/Chest: She is in respiratory distress.  Bibasilar rales and rhonchi.  Abdominal: Soft. There is no tenderness. There is no rebound and no guarding.  Musculoskeletal: Normal range of motion. She exhibits no edema and no tenderness.  No peripheral edema  Neurological: She is alert. No cranial nerve deficit. She exhibits normal muscle tone. Coordination normal.  Oriented to person only, moving all extremities  Skin: Skin is warm.    ED Course  Procedures (including critical care time)  Labs Reviewed  CBC WITH DIFFERENTIAL - Abnormal; Notable for the following:    Neutrophils Relative 85 (*)    Neutro Abs 8.2 (*)    Lymphocytes Relative 8 (*)    All other components within normal limits  COMPREHENSIVE METABOLIC PANEL - Abnormal; Notable for the following:    Glucose, Bld 110 (*)    AST 61 (*)    ALT 41 (*)    GFR calc non Af Amer 63 (*)    GFR calc Af Amer 74 (*)    All other components within normal limits  URINALYSIS, ROUTINE W REFLEX MICROSCOPIC - Abnormal; Notable for the following:    Ketones, ur 15 (*)    All other components within normal limits  CULTURE, BLOOD (ROUTINE X 2)  CULTURE, BLOOD (ROUTINE X 2)  TROPONIN I  LACTIC ACID, PLASMA  PROCALCITONIN   Dg Chest Portable 1 View  04/06/2012  *RADIOLOGY REPORT*  Clinical Data: Short of breath.  Weakness.  Altered mental status.  PORTABLE CHEST - 1 VIEW  Comparison: 01/07/2012  Findings: Artifact overlies the chest.  Heart size is  normal. There is calcification of the aorta. Brachiocephalic vessels are somewhat unfolded.  There are chronic interstitial lung markings but no sign of  active infiltrate, mass, effusion or collapse.  No acute bony finding.  IMPRESSION: Chronic lung markings.  No active disease suspected.   Original Report Authenticated By: Paulina Fusi, M.D.      1. Sepsis   2. HCAP (healthcare-associated pneumonia)       MDM  Demented patient with shortness of breath fever and cough that apparently started today. High suspicion for pneumonia possibly aspiration.  Antibiotics, IV fluids, chest x-ray.  Chest x-ray appears to be clear but the patient clinically has pneumonia with fever, chills, tachycardia and tachypnea. He is likely septic from pneumonia. Continue broad spectrum antibiotics, cultures, lactate. Paperwork shows patient to be full code.  She is speaking in full sentences on nasal cannula, no distress at this time.  D/w Dr. Janee Morn who will admit to ICU.   Date: 04/06/2012  Rate: 113  Rhythm: sinus tachycardia  QRS Axis: normal  Intervals: normal  ST/T Wave abnormalities: normal  Conduction Disutrbances:none  Narrative Interpretation:   Old EKG Reviewed: unchanged  CRITICAL CARE Performed by: Glynn Octave   Total critical care time: 40  Critical care time was exclusive of separately billable procedures and treating other patients.  Critical care was necessary to treat or prevent imminent or life-threatening deterioration.  Critical care was time spent personally by me on the following activities: development of treatment plan with patient and/or surrogate as well as nursing, discussions with consultants, evaluation of patient's response to treatment, examination of patient, obtaining history from patient or surrogate, ordering and performing treatments and interventions, ordering and review of laboratory studies, ordering and review of radiographic studies, pulse oximetry and re-evaluation of patient's condition.      Glynn Octave, MD 04/06/12 [phone]

## 2012-04-06 NOTE — ED Notes (Signed)
Pt here via EMS from [facility] for spiked fever this am and SOB( no one knew the temp) iv started in route and heart is ST

## 2012-04-06 NOTE — Progress Notes (Signed)
ANTIBIOTIC CONSULT NOTE - INITIAL  Pharmacy Consult for Vancomycin, renal adjustments of antibiotics Indication: sepsis, presumed pneumonia  No Known Allergies  Patient Measurements: Height: 5\' 2"  (157.5 cm) Weight: 127 lb 13.9 oz (58 kg) IBW/kg (Calculated) : 50.1  Vital Signs: Temp: 100.9 F (38.3 C) (03/16 1717) Temp src: Core (Comment) (03/16 1717) BP: 96/36 mmHg (03/16 1715) Pulse Rate: 52 (03/16 1715) Intake/Output from previous day:   Intake/Output from this shift: Total I/O In: -  Out: 200 [Urine:200]  Labs:  Recent Labs  04/06/12 1430  WBC 9.7  HGB 12.3  PLT 176  CREATININE 0.81   Estimated Creatinine Clearance: 38.7 ml/min (by C-G formula based on Cr of 0.81). No results found for this basename: VANCOTROUGH, VANCOPEAK, VANCORANDOM, GENTTROUGH, GENTPEAK, GENTRANDOM, TOBRATROUGH, TOBRAPEAK, TOBRARND, AMIKACINPEAK, AMIKACINTROU, AMIKACIN,  in the last 72 hours   Microbiology: No results found for this or any previous visit (from the past 720 hour(s)).  Medical History: Past Medical History  Diagnosis Date  . Diverticulosis of colon (without mention of hemorrhage) 06/24/2006  . Carcinoma of cecum 1992    History of  . Vitamin B12 deficiency   . Alzheimer's disease   . HTN (hypertension)   . Depression   . HLD (hyperlipidemia)   . Osteoporosis, unspecified   . PVD (peripheral vascular disease)   . CAD (coronary artery disease)   . GERD (gastroesophageal reflux disease)   . H. pylori infection 2004  . Presbyesophagus     Assessment: 71 yof presented 3/16 from [facility] with SOB, fever and cough. Pt found with tachypnea, tachycardia, Tmax 104, CXR with no active dz but MD suspecting PNA (?aspiration). Code Sepsis called.  Patient received a dose of Vanc and Levaquin in the ED. MD ordered for pharmacy to dose Vancomycin and adjust other antibiotics based on renal function (Cefepime, Levaquin).    Tmax 104, WBC wnl, Scr 0.81 for CG CrCl of 38.7 and  normalized CrCl of 56 ml/min.  Procalcitonin pending.  Blood culture, sputum culture, influenza panel, legionella and strep pneumo ag ordered.       Goal of Therapy:  Vancomycin trough level 15-20 mcg/ml Appropriate renal adjustments of Cefepime and Levaquin   Plan:   Vancomycin 500 mg IV q12h x 8days, first dose to start 3/17 at 0600.    Change Cefepime to 1gm IV q24h x 8days per renal function  Change Levaquin to 750 mg IV x 1 dose on 3/18 to complete 3 days of therapy  Pharmacy will follow up  Geoffry Paradise, PharmD, BCPS Pager: [phone] 5:35 PM Pharmacy #: [id number]

## 2012-04-06 NOTE — ED Notes (Signed)
AVW:[id number]> Expected date:04/06/12<BR> Expected time: 1:24 PM<BR> Means of arrival:Ambulance<BR> Comments:<BR> Shortness of Breath

## 2012-04-07 ENCOUNTER — Inpatient Hospital Stay (HOSPITAL_COMMUNITY): Payer: Medicare Other

## 2012-04-07 ENCOUNTER — Encounter (HOSPITAL_COMMUNITY): Payer: Self-pay

## 2012-04-07 DIAGNOSIS — J189 Pneumonia, unspecified organism: Secondary | ICD-10-CM

## 2012-04-07 LAB — PROCALCITONIN: Procalcitonin: 0.75 ng/mL

## 2012-04-07 LAB — COMPREHENSIVE METABOLIC PANEL
Albumin: 2.7 g/dL — ABNORMAL LOW (ref 3.5–5.2)
Alkaline Phosphatase: 52 U/L (ref 39–117)
BUN: 19 mg/dL (ref 6–23)
Chloride: 101 mEq/L (ref 96–112)
Creatinine, Ser: 0.76 mg/dL (ref 0.50–1.10)
GFR calc Af Amer: 85 mL/min — ABNORMAL LOW (ref >90)
Glucose, Bld: 104 mg/dL — ABNORMAL HIGH (ref 70–99)
Potassium: 2.9 mEq/L — ABNORMAL LOW (ref 3.5–5.1)
Total Bilirubin: 0.7 mg/dL (ref 0.3–1.2)
Total Protein: 5.4 g/dL — ABNORMAL LOW (ref 6.0–8.3)

## 2012-04-07 LAB — CBC WITH DIFFERENTIAL/PLATELET
Basophils Absolute: 0 10*3/uL (ref 0.0–0.1)
Lymphocytes Relative: 12 % (ref 12–46)
Lymphs Abs: 1.1 10*3/uL (ref 0.7–4.0)
MCV: 85.8 fL (ref 78.0–100.0)
Neutro Abs: 7.9 10*3/uL — ABNORMAL HIGH (ref 1.7–7.7)
Platelets: 131 10*3/uL — ABNORMAL LOW (ref 150–400)
RBC: 3.51 MIL/uL — ABNORMAL LOW (ref 3.87–5.11)
RDW: 14.4 % (ref 11.5–15.5)
WBC: 9.7 10*3/uL (ref 4.0–10.5)

## 2012-04-07 LAB — CORTISOL: Cortisol, Plasma: 10.5 ug/dL

## 2012-04-07 LAB — INFLUENZA PANEL BY PCR (TYPE A & B): Influenza B By PCR: NEGATIVE

## 2012-04-07 LAB — LEGIONELLA ANTIGEN, URINE

## 2012-04-07 LAB — STREP PNEUMONIAE URINARY ANTIGEN: Strep Pneumo Urinary Antigen: NEGATIVE

## 2012-04-07 MED ORDER — STARCH (THICKENING) PO POWD
ORAL | Status: DC | PRN
  Filled 2012-04-07: qty 227

## 2012-04-07 MED ORDER — METOPROLOL TARTRATE 50 MG PO TABS
50.0000 mg | ORAL_TABLET | Freq: Two times a day (BID) | ORAL | Status: DC
  Administered 2012-04-07 – 2012-04-10 (×7): 50 mg via ORAL
  Filled 2012-04-07 (×8): qty 1

## 2012-04-07 MED ORDER — POTASSIUM CHLORIDE CRYS ER 20 MEQ PO TBCR
40.0000 meq | EXTENDED_RELEASE_TABLET | ORAL | Status: AC
  Administered 2012-04-07 (×2): 40 meq via ORAL
  Filled 2012-04-07 (×2): qty 2

## 2012-04-07 MED ORDER — AMLODIPINE BESYLATE 10 MG PO TABS
10.0000 mg | ORAL_TABLET | Freq: Every day | ORAL | Status: DC
  Administered 2012-04-07 – 2012-04-10 (×4): 10 mg via ORAL
  Filled 2012-04-07 (×4): qty 1

## 2012-04-07 MED ORDER — HYDRALAZINE HCL 20 MG/ML IJ SOLN
10.0000 mg | Freq: Four times a day (QID) | INTRAMUSCULAR | Status: DC | PRN
  Administered 2012-04-07: 10 mg via INTRAVENOUS
  Filled 2012-04-07: qty 1

## 2012-04-07 MED ORDER — LORAZEPAM 0.5 MG PO TABS
0.5000 mg | ORAL_TABLET | Freq: Once | ORAL | Status: AC
  Administered 2012-04-07: 0.5 mg via ORAL
  Filled 2012-04-07: qty 1

## 2012-04-07 NOTE — Evaluation (Signed)
Clinical/Bedside Swallow Evaluation Patient Details  Name: [name] MRN: [id number] Date of Birth: 26-Jul-1922  Today's Date: 04/07/2012 Time: 0930-1022 SLP Time Calculation (min): 52 min  Past Medical History:  Past Medical History  Diagnosis Date  . Diverticulosis of colon (without mention of hemorrhage) 06/24/2006  . Carcinoma of cecum 1992    History of  . Vitamin B12 deficiency   . Alzheimer's disease   . HTN (hypertension)   . Depression   . HLD (hyperlipidemia)   . Osteoporosis, unspecified   . PVD (peripheral vascular disease)   . CAD (coronary artery disease)   . [name] (gastroesophageal reflux disease)   . H. pylori infection 2004  . Presbyesophagus    Past Surgical History:  Past Surgical History  Procedure Laterality Date  . Gallbladder surgery    . Abdominal hysterectomy  1995    with bso  . Right colectomy    . Cataract extraction  2001   HPI:  [name] is a 79 y.o. female with history of hypertension, depression, dementia, history of colon cancer status post right colectomy, status post cholecystectomy who is a resident at assisted living facility presented to the ED per notes from the facility with a one-day history of worsening shortness of breath, hypoxia with sats of 85%, shortness of breath. On interview the patient patient states she's had a productive cough over the past month and shortness of breath over the past one to 2 months. Patient endorses some chills. Patient endorses some subjective fevers. Patient endorses some dysuria, constipation, generalized weakness. Patient endorses decreased oral intake. Patient denies any chest pain, no nausea, no vomiting, no abdominal pain.   Assessment / Plan / Recommendation Clinical Impression  Patient known to this SLP from previous admit.  Previous Bedside swallow evaluation on 12/25/11 noted moderate oral phase dysphagia, but patient was able to advance to a regular diet with thin liquids prior to d/c.   Pt. had an esophagram back in 8/07 which identified presbyesophagus with dysmotility.  Amanda studies identified  a [name] and [name].  Pt. also has h/o esophageal dilitation.  Currently, pt. is very congested, with rattling sounds noted with each breath, and ongoing fevers.  Patient does not cough after swallows, but does appear to have delayed swallow initiation.  Silent aspiration is questionable, especially with thin liquids (in light of the delayed swallow).  Pt. slowly chews a cracker, but becomes easily distracted and requires cues to "Keep chewing."  Recommend downgrading diet to decrease aspiration risk, and f/u with MBS 3/18 to r/o silent aspiration.    Aspiration Risk  Moderate    Diet Recommendation Dysphagia 2 (Fine chop);Nectar-thick liquid   Liquid Administration via: Cup Medication Administration: Whole meds with puree Supervision: Full supervision/cueing for compensatory strategies;Staff feed patient Compensations: Slow rate;Small sips/bites;Follow solids with liquid Postural Changes and/or Swallow Maneuvers: Seated upright 90 degrees;Upright 30-60 min after meal    Other  Recommendations Recommended Consults: MBS Oral Care Recommendations: Oral care QID Other Recommendations: Order thickener from pharmacy;Prohibited food (jello, ice cream, thin soups);Have oral suction available;Clarify dietary restrictions   Follow Up Recommendations  Skilled Nursing facility;24 hour supervision/assistance    Frequency and Duration        Pertinent Vitals/Pain n/a    SLP Swallow Goals Patient will consume recommended diet without observed clinical signs of aspiration with: Moderate assistance   Swallow Study Prior Functional Status       General HPI: [name] is a 79 y.o. female  with history of hypertension, depression, dementia, history of colon cancer status post right colectomy, status post cholecystectomy who is a resident at assisted living facility  presented to the ED per notes from the facility with a one-day history of worsening shortness of breath, hypoxia with sats of 85%, shortness of breath. On interview the patient patient states she's had a productive cough over the past month and shortness of breath over the past one to 2 months. Patient endorses some chills. Patient endorses some subjective fevers. Patient endorses some dysuria, constipation, generalized weakness. Patient endorses decreased oral intake. Patient denies any chest pain, no nausea, no vomiting, no abdominal pain. Type of Study: Bedside swallow evaluation Diet Prior to this Study: Dysphagia 3 (soft);Thin liquids Temperature Spikes Noted: Yes Respiratory Status: Supplemental O2 delivered via (comment) History of Recent Intubation: No Behavior/Cognition: Alert;Confused;Distractible;Requires cueing;Decreased sustained attention;Hard of hearing Oral Cavity - Dentition: Dentures, top;Dentures, bottom Self-Feeding Abilities: Needs assist Patient Positioning: Upright in bed Baseline Vocal Quality: Clear Volitional Cough: Congested Volitional Swallow: Unable to elicit    Oral/Motor/Sensory Function Overall Oral Motor/Sensory Function: Impaired at baseline Labial ROM: Reduced left Labial Strength: Reduced Lingual Strength: Reduced   Ice Chips Ice chips: Not tested   Thin Liquid Thin Liquid: Within functional limits Presentation: Spoon;Cup;Straw Oral Phase Impairments: Reduced labial seal;Impaired anterior to posterior transit Oral Phase Functional Implications: Left anterior spillage Pharyngeal  Phase Impairments: Suspected delayed Swallow    Nectar Thick Nectar Thick Liquid: Not tested   Honey Thick Honey Thick Liquid: Not tested   Puree Puree: Within functional limits Presentation: Spoon   Solid   GO    Solid: Impaired Presentation: Self Fed Oral Phase Impairments: Reduced lingual movement/coordination;Impaired anterior to posterior transit       [name],  [name] 04/07/2012,10:24 AM

## 2012-04-07 NOTE — Progress Notes (Signed)
PT Cancellation Note  Patient Details Name: [name] MRN: [id number] DOB: 10-27-1922   Cancelled Treatment:    Reason Eval/Treat Not Completed: Patient not medically ready.  Per RN, pt has fever of 103 and has been restless all afternoon.  Will defer eval until appropriate.    Thanks,    Vista Deck 04/07/2012, 1:55 PM

## 2012-04-07 NOTE — Progress Notes (Signed)
Hypokalemia   K replaced  

## 2012-04-07 NOTE — Progress Notes (Addendum)
[facility]                                                                                Patient Demographics  [name], is a 79 y.o. female, DOB - Mar 03, 1922, FAO:[id number], ONG:[id number]  Admit date - 04/06/2012  Admitting Physician Rodolph Bong, MD  Outpatient Primary MD for the patient is Amanda Jenny, MD  LOS - 1   Chief Complaint  Patient presents with  . Shortness of Breath        Assessment & Plan   This 79 year old demented nursing home patient was admitted on 04/06/2012 with healthcare associated aspiration pneumonia causing high fevers and shortness of breath, she has been rectal ICU/step down unit, she is for now full code, we are trying to reach her son for definitive CODE STATUS. He is hard to reach per son-in-law who are discussed the case with today     1. Healthcare associated aspiration pneumonia causing sepsis, fevers and acute respiratory failure.- CT of the chest clearly indicating of severe pneumonia in right middle and lower lobes, continue empiric IV antibiotics to be dosed by pharmacy, have added pulmonary toiletry and chest physiotherapy, continue aspiration precautions and dysphagia 3 diet, speech to evaluate today and order further testing and diet as needed.   Pt still running fevers, ? Need for Bronch ( ? Pus on CT) , will consult PCCM.   Prognosis is poor have explained to her son # [phone] & son-in-law Selena Batten, DNR per son.     2. Hypertension. Poor control, have added beta blocker and Norvasc along with when necessary IV hydralazine for better control.    3. History of CAD. No acute issues, cycle troponins are negative, chest pain-free, continue aspirin and statin, have added beta blocker.    4. History of dementia, depression, GERD- continue Aricept, Cymbalta, Depakote along with PPI.    5. Dysphagia. Dysphagia 3 diet, feeding with assistance and aspiration precautions, speech evaluation.    6. Low  potassium we'll replace and recheck in the morning with potassium and magnesium.     Code Status: Full ??  Family Communication:  Tonye Royalty son in law, D/W Son Dimas Aguas on  [phone] - DNR, explained prognosis grim   Disposition Plan: SNF   Procedures CT chest, MBS   Consults  - PCCM, speech   DVT Prophylaxis  Lovenox    Lab Results  Component Value Date   PLT 131* 04/07/2012    Medications  Scheduled Meds: . amLODipine  10 mg Oral Daily  . antiseptic oral rinse  15 mL Mouth Rinse BID  . aspirin  81 mg Oral Daily  . atorvastatin  5 mg Oral QHS  . ceFEPime (MAXIPIME) IV  1 g Intravenous Q24H  . divalproex  125 mg Oral BID  . donepezil  10 mg Oral QHS  . DULoxetine  60 mg Oral q morning - 10a  . enoxaparin (LOVENOX) injection  40 mg Subcutaneous Q24H  . ferrous sulfate  325 mg Oral Q breakfast  . guaiFENesin  1,200 mg Oral BID  . [START ON 04/08/2012] levofloxacin (LEVAQUIN) IV  750 mg Intravenous Q48H  . metoprolol tartrate  50  mg Oral BID  . pantoprazole  40 mg Oral Daily  . sucralfate  1 g Oral TID WC & HS  . vancomycin  500 mg Intravenous Q12H  . vitamin B-12  1,000 mcg Oral QODAY   Continuous Infusions: . sodium chloride 10 mL/hr at 04/07/12 0856   PRN Meds:.acetaminophen, hydrALAZINE, ipratropium, ondansetron  Antibiotics     Anti-infectives   Start     Dose/Rate Route Frequency Ordered Stop   04/08/12 1000  levofloxacin (LEVAQUIN) IVPB 750 mg     750 mg 100 mL/hr over 90 Minutes Intravenous Every 48 hours 04/06/12 1718 04/10/12 0959   04/07/12 0600  vancomycin (VANCOCIN) 500 mg in sodium chloride 0.9 % 100 mL IVPB     500 mg 100 mL/hr over 60 Minutes Intravenous Every 12 hours 04/06/12 1731 04/15/12 0559   04/06/12 1800  ceFEPIme (MAXIPIME) 1 g in dextrose 5 % 50 mL IVPB     1 g 100 mL/hr over 30 Minutes Intravenous Every 24 hours 04/06/12 1718 04/14/12 1759   04/06/12 1800  oseltamivir (TAMIFLU) capsule 75 mg  Status:  Discontinued     75 mg  Oral 2 times daily 04/06/12 1651 04/07/12 1022   04/06/12 1500  vancomycin (VANCOCIN) IVPB 1000 mg/200 mL premix     1,000 mg 200 mL/hr over 60 Minutes Intravenous  Once 04/06/12 1353 04/06/12 1706   04/06/12 1500  piperacillin-tazobactam (ZOSYN) IVPB 3.375 g  Status:  Discontinued     3.375 g 50 mL/hr over 60 Minutes Intravenous  Once 04/06/12 1353 04/06/12 1723   04/06/12 1400  levofloxacin (LEVAQUIN) IVPB 500 mg  Status:  Discontinued     500 mg 100 mL/hr over 60 Minutes Intravenous Every 24 hours 04/06/12 1353 04/06/12 1723       Time Spent in minutes  35   SINGH,PRASHANT K M.D on 04/07/2012 at 10:33 AM  Between 7am to 7pm - Pager - [phone]  After 7pm go to [email or web address] - password [email or web address]  And look for the night coverage person covering for me after hours  [facility] Office  [phone]    Subjective:   [name] today has, No headache, No chest pain, No abdominal pain - No Nausea, No new weakness tingling or numbness,+ Cough & some SOB.    Objective:   Filed Vitals:   04/07/12 0500 04/07/12 0800 04/07/12 0816 04/07/12 0853  BP: 174/51  199/53 200/53  Pulse: 81  96   Temp: 100 F (37.8 C) 101.1 F (38.4 C) 101.3 F (38.5 C)   TempSrc:  Core (Comment) Core (Comment)   Resp: 22  24   Height:      Weight:      SpO2: 98%  97%     Wt Readings from Last 3 Encounters:  04/07/12 60.3 kg (132 lb 15 oz)  12/28/11 63.3 kg (139 lb 8.8 oz)  10/24/11 67.586 kg (149 lb)     Intake/Output Summary (Last 24 hours) at 04/07/12 1033 Last data filed at 04/07/12 0800  Gross per 24 hour  Intake   3050 ml  Output   1040 ml  Net   2010 ml    Exam Awake but not alert, No new F.N deficits, Normal affect [place].AT,PERRAL Supple Neck,No JVD, No cervical lymphadenopathy appriciated.  Symmetrical Chest wall movement, Good air movement bilaterally, Coarse Bilat b sounds RRR,No Gallops,Rubs or new Murmurs, No Parasternal Heave +ve B.Sounds, Abd Soft, Non  tender, No organomegaly appriciated, No rebound - guarding or rigidity.  No Cyanosis, Clubbing or edema, No new Rash or bruise    Data Review   Micro Results Recent Results (from the past 240 hour(s))  CULTURE, BLOOD (ROUTINE X 2)     Status: None   Collection Time    04/06/12  2:21 PM      Result Value Range Status   Specimen Description BLOOD RIGHT ARM   Final   Special Requests BOTTLES DRAWN AEROBIC AND ANAEROBIC 4CC EACH   Final   Culture  Setup Time 04/06/2012 20:12   Final   Culture     Final   Value:        BLOOD CULTURE RECEIVED NO GROWTH TO DATE CULTURE WILL BE HELD FOR 5 DAYS BEFORE ISSUING A FINAL NEGATIVE REPORT   Report Status PENDING   Incomplete  CULTURE, BLOOD (ROUTINE X 2)     Status: None   Collection Time    04/06/12  2:35 PM      Result Value Range Status   Specimen Description BLOOD RIGHT ARM   Final   Special Requests BOTTLES DRAWN AEROBIC AND ANAEROBIC 3CC EACH   Final   Culture  Setup Time 04/06/2012 20:12   Final   Culture     Final   Value:        BLOOD CULTURE RECEIVED NO GROWTH TO DATE CULTURE WILL BE HELD FOR 5 DAYS BEFORE ISSUING A FINAL NEGATIVE REPORT   Report Status PENDING   Incomplete  MRSA PCR SCREENING     Status: None   Collection Time    04/06/12  5:15 PM      Result Value Range Status   MRSA by PCR NEGATIVE  NEGATIVE Final   Comment:            The GeneXpert MRSA Assay (FDA     approved for NASAL specimens     only), is one component of a     comprehensive MRSA colonization     surveillance program. It is not     intended to diagnose MRSA     infection nor to guide or     monitor treatment for     MRSA infections.    Radiology Reports Ct Angio Chest Pe W/cm &/or Wo Cm  04/06/2012  *RADIOLOGY REPORT*  Clinical Data: 79 year old female with chest pain, tachycardia, fever and shortness of breath.  CT ANGIOGRAPHY CHEST  Technique:  Multidetector CT imaging of the chest using the standard protocol during bolus administration of  intravenous contrast. Multiplanar reconstructed images including MIPs were obtained and reviewed to evaluate the vascular anatomy.  Contrast: OMNIPAQUE IOHEXOL 350 MG/ML SOLN  Comparison: 09/22/2008 CT and 04/06/2012 chest radiograph  Findings: This is a technically equivocal study secondary to suboptimal opacification of the pulmonary arteries.  No definite large or central pulmonary emboli identified. Mild ectasia of the thoracic aorta noted without evidence of aneurysm. Upper limits normal heart size present.  There are no pleural or pericardial effusions. Prominent mediastinal lymph nodes are unchanged from 2010.  Right lower lobe consolidation, mild left lower lobe basilar airspace opacities and posterior right upper lobe airspace disease noted compatible with pneumonia or aspiration. Mucous/fluid fills many of the bronchi to the lower lobe. There is no evidence of suspicious nodule or mass.  The visualized upper abdomen is unremarkable. Remote fractures of T12, right-sided rib and the sternum noted.  IMPRESSION: No evidence of pulmonary emboli.  Right lower lobe consolidation and airspace disease within the left lower lobe and  posterior right upper lobe - compatible with aspiration or pneumonia.  Mucus/fluid fills many of the lower lobe bronchi bilaterally.  Ectatic thoracic aorta without aneurysm.  Remote T12, right rib and sternal fractures.   Original Report Authenticated By: Harmon Pier, M.D.    Dg Chest Port 1 View  04/07/2012  *RADIOLOGY REPORT*  Clinical Data: Respiratory failure.  PORTABLE CHEST - 1 VIEW  Comparison: 04/06/2012  Findings: Shallow inspiration.  Infiltration in the right lung base appears to have increased since previous study.  Minimal infiltration in the left lung base is stable.  No blunting of costophrenic angles.  No pneumothorax.  Normal heart size and pulmonary vascularity.  Old right rib fractures.  IMPRESSION: Infiltrates in both bases, increasing on the right since  previous study.   Original Report Authenticated By: Burman Nieves, M.D.    D    CBC  Recent Labs Lab 04/06/12 1430 04/07/12 0202  WBC 9.7 9.7  HGB 12.3 9.8*  HCT 38.5 30.1*  PLT 176 131*  MCV 86.5 85.8  MCH 27.6 28.2  MCHC 31.9 32.9  RDW 14.4 14.4  LYMPHSABS 0.8 1.1  MONOABS 0.7 0.6  EOSABS 0.0 0.0  BASOSABS 0.0 0.0    Chemistries   Recent Labs Lab 04/06/12 1430 04/06/12 1725 04/07/12 0202  NA 137  --  135  K 3.5  --  2.9*  CL 97  --  101  CO2 27  --  24  GLUCOSE 110*  --  104*  BUN 22  --  19  CREATININE 0.81  --  0.76  CALCIUM 9.4  --  8.0*  MG  --  1.9  --   AST 61*  --  45*  ALT 41*  --  39*  ALKPHOS 69  --  52  BILITOT 0.8  --  0.7   ------------------------------------------------------------------------------------------------------------------ estimated creatinine clearance is 42.4 ml/min (by C-G formula based on Cr of 0.76). ------------------------------------------------------------------------------------------------------------------ No results found for this basename: HGBA1C,  in the last 72 hours ------------------------------------------------------------------------------------------------------------------ No results found for this basename: CHOL, HDL, LDLCALC, TRIG, CHOLHDL, LDLDIRECT,  in the last 72 hours ------------------------------------------------------------------------------------------------------------------ No results found for this basename: TSH, T4TOTAL, FREET3, T3FREE, THYROIDAB,  in the last 72 hours ------------------------------------------------------------------------------------------------------------------ No results found for this basename: VITAMINB12, FOLATE, FERRITIN, TIBC, IRON, RETICCTPCT,  in the last 72 hours  Coagulation profile No results found for this basename: INR, PROTIME,  in the last 168 hours  No results found for this basename: DDIMER,  in the last 72 hours  Cardiac Enzymes  Recent Labs Lab  04/06/12 1430 04/06/12 2055 04/07/12 0202  TROPONINI <0.30 <0.30 <0.30   ------------------------------------------------------------------------------------------------------------------ No components found with this basename: POCBNP,

## 2012-04-07 NOTE — Progress Notes (Signed)
CARE MANAGEMENT NOTE 04/07/2012  Patient:  [name],[name]   Account Number:  [id number]  Date Initiated:  04/07/2012  Documentation initiated by:  DAVIS,RHONDA  Subjective/Objective Assessment:   pt admitted from local alf with temp of 102, and resp congestion, cxr confirms pna , hypoxia on admission with po2 of 85%     Action/Plan:   alf/attempting to contact son in  [place] about code status message left on the listed cell phone for son to retrun call to icu for md to speak to.   Anticipated DC Date:  04/10/2012   Anticipated DC Plan:  ASSISTED LIVING / REST HOME  In-house referral  NA      DC Planning Services  CM consult      PAC Choice  NA   Choice offered to / List presented to:  NA   DME arranged  NA      DME agency  NA     HH arranged  NA      HH agency  NA   Status of service:  In process, will continue to follow Medicare Important Message given?  NA - LOS <3 / Initial given by admissions (If response is "NO", the following Medicare IM given date fields will be blank) Date Medicare IM given:   Date Additional Medicare IM given:    Discharge Disposition:    Per UR Regulation:  Reviewed for med. necessity/level of care/duration of stay  If discussed at Long Length of Stay Meetings, dates discussed:    Comments:  [id number]/Rhonda Earlene Plater, RN, BSN, CCM:  CHART REVIEWED AND UPDATED.  Next chart review due on [id number]. NO DISCHARGE NEEDS PRESENT AT THIS TIME. CASE MANAGEMENT [phone]

## 2012-04-07 NOTE — Plan of Care (Signed)
Problem: Phase I Progression Outcomes Goal: Initial discharge plan identified Outcome: Completed/Met Date Met:  04/07/12 Pt. from [facility] [place]

## 2012-04-07 NOTE — Consult Note (Signed)
PULMONARY  / CRITICAL CARE MEDICINE  Name: [name] MRN: [id number] DOB: 1922-03-26    ADMISSION DATE:  04/06/2012 CONSULTATION DATE:  04/07/12  REFERRING MD : [facility]  CHIEF COMPLAINT:  Acute resp distress/ resp failure   BRIEF PATIENT DESCRIPTION:  Very frail 89 yowf  nhp with advanced dementia admit 3/16 with new onset sob/ hypoxemic rf admitted 3/16 with presumed asp pna with temp to 104 and unable to cough up secretions effectively to pccm service consulted for possible bronchoscopy pm 3/17  SIGNIFICANT EVENTS / STUDIES:      CULTURES: MRSA screen 3/16 > neg BC x 2 3/16 >>>  ANTIBIOTICS: Cefepime 3/16>> Levaquin 3/16 >>  HISTORY OF PRESENT ILLNESS:    From hpi 79 y.o. female with history of hypertension, depression, dementia, history of colon cancer status post right colectomy, status post cholecystectomy who is a resident at assisted living facility presented to the ED per notes from the facility with a one-day history of worsening shortness of breath, hypoxia with sats of 85%, shortness of breath. On interview the patient patient states she's had a productive cough over the past month and shortness of breath over the past one to 2 months. Patient endorses some chills. Patient endorses some subjective fevers. Patient endorses some dysuria, constipation, generalized weakness. Patient endorses decreased oral intake. Patient denies any chest pain, no nausea, no vomiting, no abdominal pain.  Patient was seen in the ED was noted to have a temperature of 104, heart rate in the 120s, respiratory rate in the 20s, systolic blood pressure in the 130s. Chest x-ray which was obtained was negative for any acute infiltrate. Urinalysis which was done was unremarkable. CBC which was done had a left shift otherwise was unremarkable. Lactic acid level done was 1.4. Basic metabolic profile done was unremarkable hepatic panel did have a elevated AST and ALT. Initial troponin obtained was  negative. EKG showed a sinus tachycardia however did have some T wave inversions in leads 1 aVL and V4.  Blood cultures were obtained and patient was placed empirically on IV vancomycin, and Zosyn And Levaquin.  Admitted to [facility] with dx of hcap  PAST MEDICAL HISTORY :  Past Medical History  Diagnosis Date  . Diverticulosis of colon (without mention of hemorrhage) 06/24/2006  . Carcinoma of cecum 1992    History of  . Vitamin B12 deficiency   . Alzheimer's disease   . HTN (hypertension)   . Depression   . HLD (hyperlipidemia)   . Osteoporosis, unspecified   . PVD (peripheral vascular disease)   . CAD (coronary artery disease)   . GERD (gastroesophageal reflux disease)   . H. pylori infection 2004  . Presbyesophagus    Past Surgical History  Procedure Laterality Date  . Gallbladder surgery    . Abdominal hysterectomy  1995    with bso  . Right colectomy    . Cataract extraction  2001   Prior to Admission medications   Medication Sig Start Date End Date Taking? Authorizing Provider  acetaminophen (TYLENOL) 325 MG tablet Take 650 mg by mouth 3 (three) times daily. At 9am, 3pm, and 9pm   Yes Historical Provider, MD  alendronate (FOSAMAX) 70 MG tablet Take 70 mg by mouth every 7 (seven) days. Take on Saturdays with a full glass of water on an empty stomach.   Yes Historical Provider, MD  aspirin 81 MG tablet Take 81 mg by mouth daily.   Yes Historical Provider, MD  atorvastatin (LIPITOR)  10 MG tablet Take 5 mg by mouth at bedtime.   Yes Historical Provider, MD  Calcium-Vitamin D 600-200 MG-UNIT per tablet Take 1 tablet by mouth every morning.    Yes Historical Provider, MD  cholecalciferol (VITAMIN D) 400 UNITS TABS Take 400 Units by mouth every morning.    Yes Historical Provider, MD  divalproex (DEPAKOTE SPRINKLE) 125 MG capsule Take 125 mg by mouth 2 (two) times daily. At 9am and 9pm   Yes Historical Provider, MD  donepezil (ARICEPT) 10 MG tablet Take 10 mg by mouth at  bedtime.   Yes Historical Provider, MD  DULoxetine (CYMBALTA) 60 MG capsule Take 60 mg by mouth every morning.    Yes Historical Provider, MD  ferrous sulfate 325 (65 FE) MG tablet Take 325 mg by mouth daily with breakfast.   Yes Historical Provider, MD  hydrochlorothiazide (MICROZIDE) 12.5 MG capsule Take 12.5 mg by mouth every morning.    Yes Historical Provider, MD  lisinopril (PRINIVIL,ZESTRIL) 10 MG tablet Take 10 mg by mouth every morning.   Yes Historical Provider, MD  LORazepam (ATIVAN) 0.5 MG tablet Take 0.25 mg by mouth 3 (three) times daily. At 9am, 3pm, and 9pm 12/28/11  Yes Ripudeep Jenna Luo, MD  Multiple Vitamin (MULTIVITAMIN WITH MINERALS) TABS Take 1 tablet by mouth every morning.   Yes Historical Provider, MD  sucralfate (CARAFATE) 1 GM/10ML suspension Take 1 g by mouth 4 (four) times daily -  with meals and at bedtime. At 8:30am, 11:30am, 4:30pm, and 9pm   Yes Historical Provider, MD  vitamin B-12 (CYANOCOBALAMIN) 1000 MCG tablet Take 1,000 mcg by mouth every other day.   Yes Historical Provider, MD   No Known Allergies  FAMILY HISTORY:  Family History  Problem Relation Age of Onset  . Heart disease Mother   . Colon cancer Neg Hx    SOCIAL HISTORY:  reports that she has quit smoking. Her smoking use included Cigarettes. She smoked 0.00 packs per day. She has never used smokeless tobacco. She reports that she does not drink alcohol or use illicit drugs.  REVIEW OF SYSTEMS:  N/a (unreliable hx)  SUBJECTIVE:  nad at 30 degrees hob, congested cough, mild increase wob   VITAL SIGNS: Temp:  [99.5 F (37.5 C)-103.1 F (39.5 C)] 100 F (37.8 C) (03/17 1648) Pulse Rate:  [52-116] 63 (03/17 1648) Resp:  [18-31] 21 (03/17 1648) BP: (93-200)/(21-130) 110/63 mmHg (03/17 1648) SpO2:  [88 %-99 %] 99 % (03/17 1648) Weight:  [127 lb 13.9 oz (58 kg)-132 lb 15 oz (60.3 kg)] 132 lb 15 oz (60.3 kg) (03/17 0400) 02 rx 2lpm   HEMODYNAMICS:   VENTILATOR SETTINGS:   INTAKE /  OUTPUT: Intake/Output     03/16 0701 - 03/17 0700 03/17 0701 - 03/18 0700   I.V. (mL/kg) 1200 (19.9) 464 (7.7)   Other 1500    IV Piggyback 150    Total Intake(mL/kg) 2850 (47.3) 464 (7.7)   Urine (mL/kg/hr) 1040 600 (1)   Total Output 1040 600   Net +1810 -136          PHYSICAL EXAMINATION: General:    Very frail elederly wf with ineffective congested sounding cough HEENT oropharanx No jvd Neck supple, no nodes Lungs with insp and esp rhonchi RRR no s3 abd soft not distended Ext warm no edema Neuro: oriented to person only, not place, "you're a policeman"  LABS:  Recent Labs Lab 04/06/12 1430 04/06/12 1431 04/06/12 1725 04/06/12 2055 04/06/12 2155 04/07/12 0202  HGB 12.3  --   --   --   --  9.8*  WBC 9.7  --   --   --   --  9.7  PLT 176  --   --   --   --  131*  NA 137  --   --   --   --  135  K 3.5  --   --   --   --  2.9*  CL 97  --   --   --   --  101  CO2 27  --   --   --   --  24  GLUCOSE 110*  --   --   --   --  104*  BUN 22  --   --   --   --  19  CREATININE 0.81  --   --   --   --  0.76  CALCIUM 9.4  --   --   --   --  8.0*  MG  --   --  1.9  --   --   --   AST 61*  --   --   --   --  45*  ALT 41*  --   --   --   --  39*  ALKPHOS 69  --   --   --   --  52  BILITOT 0.8  --   --   --   --  0.7  PROT 6.9  --   --   --   --  5.4*  ALBUMIN 3.7  --   --   --   --  2.7*  APTT  --   --   --   --  47*  --   LATICACIDVEN  --  1.4  --   --   --   --   TROPONINI <0.30  --   --  <0.30  --  <0.30  PROCALCITON <0.10  --   --   --   --  0.75   No results found for this basename: GLUCAP,  in the last 168 hours  pcxr 3/17 Infiltrates in both bases, increasing on the right since previous  study.   ASSESSMENT / PLAN:  PULMONARY A: Acute hypoxemic resp failure secondary to  HCAP/ asp pneumonia  rec see ID section No role for fob in this setting NCB approp   INFECTIOUS A: HCAP vs Asp rec Agree with empirical rx per [facility]   NEUROLOGIC A: Advanced  dementia complicated by asp Rec: dietary restrictions/ ncb appropriate  Sandrea Hughs MD Pulmonary and Critical Care Medicine [facility] Pager: [phone]  04/07/2012, 5:02 PM

## 2012-04-08 ENCOUNTER — Encounter (HOSPITAL_COMMUNITY): Payer: Self-pay | Admitting: *Deleted

## 2012-04-08 ENCOUNTER — Inpatient Hospital Stay (HOSPITAL_COMMUNITY): Payer: Medicare Other

## 2012-04-08 LAB — CBC
HCT: 30.7 % — ABNORMAL LOW (ref 36.0–46.0)
Hemoglobin: 10.1 g/dL — ABNORMAL LOW (ref 12.0–15.0)
MCH: 27.8 pg (ref 26.0–34.0)
MCHC: 32.9 g/dL (ref 30.0–36.0)
MCV: 84.6 fL (ref 78.0–100.0)

## 2012-04-08 LAB — BASIC METABOLIC PANEL
BUN: 13 mg/dL (ref 6–23)
Calcium: 8.7 mg/dL (ref 8.4–10.5)
GFR calc non Af Amer: 78 mL/min — ABNORMAL LOW (ref >90)
Glucose, Bld: 78 mg/dL (ref 70–99)

## 2012-04-08 MED ORDER — POTASSIUM CHLORIDE IN NACL 20-0.9 MEQ/L-% IV SOLN
INTRAVENOUS | Status: AC
  Administered 2012-04-08: 11:00:00 via INTRAVENOUS
  Filled 2012-04-08: qty 1000

## 2012-04-08 MED ORDER — VANCOMYCIN HCL 500 MG IV SOLR
500.0000 mg | Freq: Once | INTRAVENOUS | Status: AC
  Administered 2012-04-08: 500 mg via INTRAVENOUS
  Filled 2012-04-08: qty 500

## 2012-04-08 MED ORDER — VANCOMYCIN HCL IN DEXTROSE 1-5 GM/200ML-% IV SOLN
1000.0000 mg | Freq: Two times a day (BID) | INTRAVENOUS | Status: DC
  Administered 2012-04-09 – 2012-04-10 (×3): 1000 mg via INTRAVENOUS
  Filled 2012-04-08 (×4): qty 200

## 2012-04-08 MED ORDER — LORAZEPAM 2 MG/ML IJ SOLN
0.5000 mg | INTRAMUSCULAR | Status: AC | PRN
  Administered 2012-04-08 – 2012-04-09 (×2): 0.5 mg via INTRAVENOUS
  Filled 2012-04-08 (×2): qty 1

## 2012-04-08 NOTE — Progress Notes (Signed)
ANTIBIOTIC CONSULT NOTE - FOLLOW UP  Pharmacy Consult for Vancomycin, antibiotic renal dose adjustment Indication: pneumonia  No Known Allergies  Patient Measurements: Height: 5\' 2"  (157.5 cm) Weight: 132 lb 15 oz (60.3 kg) IBW/kg (Calculated) : 50.1  Vital Signs: Temp: 98.8 F (37.1 C) (03/18 1257) Temp src: Core (Comment) (03/18 0800) BP: 165/51 mmHg (03/18 1151) Pulse Rate: 64 (03/18 1257) Intake/Output from previous day: 03/17 0701 - 03/18 0700 In: 804 [I.V.:554; IV Piggyback:250] Out: 1100 [Urine:1100]  Labs:  Recent Labs  04/06/12 1430 04/07/12 0202 04/08/12 0646  WBC 9.7 9.7 8.5  HGB 12.3 9.8* 10.1*  PLT 176 131* 124*  CREATININE 0.81 0.76 0.65   Estimated Creatinine Clearance: 42.4 ml/min (by C-G formula based on Cr of 0.65).  Microbiology: 3/16 MRSA: neg 3/16 blood x 2 >> NGTD 3/16 sputum >> 3/16 influenza PCR >> negative 3/16 legionella and strep pneumo ag >> negative  Anti-infectives: 3/16 >> Vanc x8d >> 3/16 >> Levaquin x 3d >> 3/16 >> Cefepime x 8d >> 3/16 >> Tamiflu >> 3/17  Assessment: 89 yof presented 3/16 from [facility] with SOB, fever and cough. Pt found with tachypnea, tachycardia, Tmax 104, CXR with no active dz but MD suspecting PNA (?aspiration). Code Sepsis called. Patient received a dose of Vanc and Levaquin in the ED. MD ordered for pharmacy to dose Vancomycin and adjust other antibiotics based on renal function (Cefepime, Levaquin).  Day # 3/8 Vancomycin and Cefepime, Day #3/3 Levaquin (order completed) SCr stable, CrCl ~ 42 ml/min Tm 103.1, currently afebrile.  WBC wnl. Cultures negative to date.  Goal of Therapy:  Vancomycin trough level 15-20 mcg/ml Appropriate abx dosing, eradication of infection.   Plan:   Continue Cefepime 1g IV q24h  Continue Vancomycin 500mg  IV q12h.  Measure Vanc trough at steady state.  Follow up renal fxn and culture results.   Lynann Beaver PharmD, BCPS Pager [phone] 04/08/2012 1:12  PM

## 2012-04-08 NOTE — Progress Notes (Signed)
[facility]                                                                                Patient Demographics  [name], is a 79 y.o. female, DOB - [birth year], ZOX:[id number], WJX:[id number]  Admit date - 04/06/2012  Admitting Physician Rodolph Bong, MD  Outpatient Primary MD for the patient is [name], MD  LOS - 2   Chief Complaint  Patient presents with  . Shortness of Breath        Assessment & Plan   This 79 year old demented nursing home patient was admitted on 04/06/2012 with healthcare associated aspiration pneumonia causing high fevers and shortness of breath, she has been rectal ICU/step down unit, due to her severe CT scan findings PCCMwas consulted, have discussed her poor prognosis with her son over the phone, she is DO NOT RESUSCITATE full medical treatment to continue.  Key in her case will be continued aspiration precautions, intense pulmonary toiletry and chest physiotherapy.    1. Healthcare associated aspiration pneumonia causing sepsis, fevers and acute respiratory failure.- CT of the chest clearly indicating of severe pneumonia in right middle and lower lobes (?pus), continue empiric IV antibiotics to be dosed by pharmacy, have added pulmonary toiletry and chest physiotherapy, continue aspiration precautions and dysphagia 3 diet, speech following, MBS today, since ? Pus in bronchus on CT, consulted PCCM, no FOB for now, advised to continue Med Rx and Pulm toletry as before.   Prognosis is poor have explained to her son # [phone] & son-in-law Selena Batten, DNR per son.     2. Hypertension. Poor control, have added beta blocker and Norvasc along with when necessary IV hydralazine for better control.    3. History of CAD. No acute issues, cycle troponins are negative, chest pain-free, continue aspirin and statin, have added beta blocker.    4. History of dementia, depression, GERD- continue Aricept, Cymbalta, Depakote along with  PPI.    5. Dysphagia. Dysphagia 3 diet, feeding with assistance and aspiration precautions, speech evaluation done, MBS today.    6. Low potassium - replaced & stable.     Code Status: Full ??  Family Communication:  Tonye Royalty son in law, D/W Son Dimas Aguas on  [phone] - DNR, explained prognosis grim   Disposition Plan: SNF   Procedures CT chest, MBS   Consults  - PCCM, speech   DVT Prophylaxis  Lovenox    Lab Results  Component Value Date   PLT 124* 04/08/2012    Medications  Scheduled Meds: . amLODipine  10 mg Oral Daily  . antiseptic oral rinse  15 mL Mouth Rinse BID  . aspirin  81 mg Oral Daily  . atorvastatin  5 mg Oral QHS  . ceFEPime (MAXIPIME) IV  1 g Intravenous Q24H  . divalproex  125 mg Oral BID  . donepezil  10 mg Oral QHS  . DULoxetine  60 mg Oral q morning - 10a  . enoxaparin (LOVENOX) injection  40 mg Subcutaneous Q24H  . ferrous sulfate  325 mg Oral Q breakfast  . guaiFENesin  1,200 mg Oral BID  . levofloxacin (LEVAQUIN) IV  750 mg Intravenous Q48H  .  metoprolol tartrate  50 mg Oral BID  . pantoprazole  40 mg Oral Daily  . sucralfate  1 g Oral TID WC & HS  . vancomycin  500 mg Intravenous Q12H  . vitamin B-12  1,000 mcg Oral QODAY   Continuous Infusions: . sodium chloride 10 mL/hr at 04/07/12 0856   PRN Meds:.acetaminophen, food thickener, hydrALAZINE, ipratropium, ondansetron  Antibiotics     Anti-infectives   Start     Dose/Rate Route Frequency Ordered Stop   04/08/12 1000  levofloxacin (LEVAQUIN) IVPB 750 mg     750 mg 100 mL/hr over 90 Minutes Intravenous Every 48 hours 04/06/12 1718 04/10/12 0959   04/07/12 0600  vancomycin (VANCOCIN) 500 mg in sodium chloride 0.9 % 100 mL IVPB     500 mg 100 mL/hr over 60 Minutes Intravenous Every 12 hours 04/06/12 1731 04/15/12 0559   04/06/12 1800  ceFEPIme (MAXIPIME) 1 g in dextrose 5 % 50 mL IVPB     1 g 100 mL/hr over 30 Minutes Intravenous Every 24 hours 04/06/12 1718 04/14/12 1759    04/06/12 1800  oseltamivir (TAMIFLU) capsule 75 mg  Status:  Discontinued     75 mg Oral 2 times daily 04/06/12 1651 04/07/12 1022   04/06/12 1500  vancomycin (VANCOCIN) IVPB 1000 mg/200 mL premix     1,000 mg 200 mL/hr over 60 Minutes Intravenous  Once 04/06/12 1353 04/06/12 1706   04/06/12 1500  piperacillin-tazobactam (ZOSYN) IVPB 3.375 g  Status:  Discontinued     3.375 g 50 mL/hr over 60 Minutes Intravenous  Once 04/06/12 1353 04/06/12 1723   04/06/12 1400  levofloxacin (LEVAQUIN) IVPB 500 mg  Status:  Discontinued     500 mg 100 mL/hr over 60 Minutes Intravenous Every 24 hours 04/06/12 1353 04/06/12 1723       Time Spent in minutes  35   SINGH,PRASHANT K M.D on 04/08/2012 at 9:09 AM  Between 7am to 7pm - Pager - [phone]  After 7pm go to [email or web address] - password [email or web address]  And look for the night coverage person covering for me after hours  [facility] Office  [phone]    Subjective:   [name] today has, No headache, No chest pain, No abdominal pain - No Nausea, No new weakness tingling or numbness,+ Cough & some SOB.    Objective:   Filed Vitals:   04/07/12 2300 04/08/12 0000 04/08/12 0800 04/08/12 0848  BP: 117/79 128/65 193/63 164/32  Pulse: 57 59 70 75  Temp:   98.6 F (37 C)   TempSrc:   Core (Comment)   Resp: 24 21 24 23   Height:      Weight:      SpO2: 97% 99% 92% 94%    Wt Readings from Last 3 Encounters:  04/07/12 60.3 kg (132 lb 15 oz)  12/28/11 63.3 kg (139 lb 8.8 oz)  10/24/11 67.586 kg (149 lb)     Intake/Output Summary (Last 24 hours) at 04/08/12 0909 Last data filed at 04/08/12 0900  Gross per 24 hour  Intake    704 ml  Output   1100 ml  Net   -396 ml    Exam Awake but not alert, No new F.N deficits, Normal affect [place].AT,PERRAL Supple Neck,No JVD, No cervical lymphadenopathy appriciated.  Symmetrical Chest wall movement, Good air movement bilaterally, Coarse Bilat b sounds RRR,No Gallops,Rubs or new Murmurs,  No Parasternal Heave +ve B.Sounds, Abd Soft, Non tender, No organomegaly appriciated, No rebound - guarding or rigidity.  No Cyanosis, Clubbing or edema, No new Rash or bruise    Data Review   Micro Results Recent Results (from the past 240 hour(s))  CULTURE, BLOOD (ROUTINE X 2)     Status: None   Collection Time    04/06/12  2:21 PM      Result Value Range Status   Specimen Description BLOOD RIGHT ARM   Final   Special Requests BOTTLES DRAWN AEROBIC AND ANAEROBIC 4CC EACH   Final   Culture  Setup Time 04/06/2012 20:12   Final   Culture     Final   Value:        BLOOD CULTURE RECEIVED NO GROWTH TO DATE CULTURE WILL BE HELD FOR 5 DAYS BEFORE ISSUING A FINAL NEGATIVE REPORT   Report Status PENDING   Incomplete  CULTURE, BLOOD (ROUTINE X 2)     Status: None   Collection Time    04/06/12  2:35 PM      Result Value Range Status   Specimen Description BLOOD RIGHT ARM   Final   Special Requests BOTTLES DRAWN AEROBIC AND ANAEROBIC 3CC EACH   Final   Culture  Setup Time 04/06/2012 20:12   Final   Culture     Final   Value:        BLOOD CULTURE RECEIVED NO GROWTH TO DATE CULTURE WILL BE HELD FOR 5 DAYS BEFORE ISSUING A FINAL NEGATIVE REPORT   Report Status PENDING   Incomplete  MRSA PCR SCREENING     Status: None   Collection Time    04/06/12  5:15 PM      Result Value Range Status   MRSA by PCR NEGATIVE  NEGATIVE Final   Comment:            The GeneXpert MRSA Assay (FDA     approved for NASAL specimens     only), is one component of a     comprehensive MRSA colonization     surveillance program. It is not     intended to diagnose MRSA     infection nor to guide or     monitor treatment for     MRSA infections.    Radiology Reports Ct Angio Chest Pe W/cm &/or Wo Cm  04/06/2012  *RADIOLOGY REPORT*  Clinical Data: 78 year old female with chest pain, tachycardia, fever and shortness of breath.  CT ANGIOGRAPHY CHEST  Technique:  Multidetector CT imaging of the chest using the  standard protocol during bolus administration of intravenous contrast. Multiplanar reconstructed images including MIPs were obtained and reviewed to evaluate the vascular anatomy.  Contrast: OMNIPAQUE IOHEXOL 350 MG/ML SOLN  Comparison: 09/22/2008 CT and 04/06/2012 chest radiograph  Findings: This is a technically equivocal study secondary to suboptimal opacification of the pulmonary arteries.  No definite large or central pulmonary emboli identified. Mild ectasia of the thoracic aorta noted without evidence of aneurysm. Upper limits normal heart size present.  There are no pleural or pericardial effusions. Prominent mediastinal lymph nodes are unchanged from 2010.  Right lower lobe consolidation, mild left lower lobe basilar airspace opacities and posterior right upper lobe airspace disease noted compatible with pneumonia or aspiration. Mucous/fluid fills many of the bronchi to the lower lobe. There is no evidence of suspicious nodule or mass.  The visualized upper abdomen is unremarkable. Remote fractures of T12, right-sided rib and the sternum noted.  IMPRESSION: No evidence of pulmonary emboli.  Right lower lobe consolidation and airspace disease within the left lower lobe and  posterior right upper lobe - compatible with aspiration or pneumonia.  Mucus/fluid fills many of the lower lobe bronchi bilaterally.  Ectatic thoracic aorta without aneurysm.  Remote T12, right rib and sternal fractures.   Original Report Authenticated By: Harmon Pier, M.D.    Dg Chest Port 1 View  04/07/2012  *RADIOLOGY REPORT*  Clinical Data: Respiratory failure.  PORTABLE CHEST - 1 VIEW  Comparison: 04/06/2012  Findings: Shallow inspiration.  Infiltration in the right lung base appears to have increased since previous study.  Minimal infiltration in the left lung base is stable.  No blunting of costophrenic angles.  No pneumothorax.  Normal heart size and pulmonary vascularity.  Old right rib fractures.  IMPRESSION: Infiltrates  in both bases, increasing on the right since previous study.   Original Report Authenticated By: Burman Nieves, M.D.    D    CBC  Recent Labs Lab 04/06/12 1430 04/07/12 0202 04/08/12 0646  WBC 9.7 9.7 8.5  HGB 12.3 9.8* 10.1*  HCT 38.5 30.1* 30.7*  PLT 176 131* 124*  MCV 86.5 85.8 84.6  MCH 27.6 28.2 27.8  MCHC 31.9 32.9 32.9  RDW 14.4 14.4 14.2  LYMPHSABS 0.8 1.1  --   MONOABS 0.7 0.6  --   EOSABS 0.0 0.0  --   BASOSABS 0.0 0.0  --     Chemistries   Recent Labs Lab 04/06/12 1430 04/06/12 1725 04/07/12 0202 04/08/12 0646  NA 137  --  135 138  K 3.5  --  2.9* 3.5  CL 97  --  101 106  CO2 27  --  24 23  GLUCOSE 110*  --  104* 78  BUN 22  --  19 13  CREATININE 0.81  --  0.76 0.65  CALCIUM 9.4  --  8.0* 8.7  MG  --  1.9  --  2.0  AST 61*  --  45*  --   ALT 41*  --  39*  --   ALKPHOS 69  --  52  --   BILITOT 0.8  --  0.7  --    ------------------------------------------------------------------------------------------------------------------ estimated creatinine clearance is 42.4 ml/min (by C-G formula based on Cr of 0.65). ------------------------------------------------------------------------------------------------------------------ No results found for this basename: HGBA1C,  in the last 72 hours ------------------------------------------------------------------------------------------------------------------ No results found for this basename: CHOL, HDL, LDLCALC, TRIG, CHOLHDL, LDLDIRECT,  in the last 72 hours ------------------------------------------------------------------------------------------------------------------ No results found for this basename: TSH, T4TOTAL, FREET3, T3FREE, THYROIDAB,  in the last 72 hours ------------------------------------------------------------------------------------------------------------------ No results found for this basename: VITAMINB12, FOLATE, FERRITIN, TIBC, IRON, RETICCTPCT,  in the last 72 hours  Coagulation  profile No results found for this basename: INR, PROTIME,  in the last 168 hours  No results found for this basename: DDIMER,  in the last 72 hours  Cardiac Enzymes  Recent Labs Lab 04/06/12 1430 04/06/12 2055 04/07/12 0202  TROPONINI <0.30 <0.30 <0.30   ------------------------------------------------------------------------------------------------------------------ No components found with this basename: POCBNP,

## 2012-04-08 NOTE — Procedures (Signed)
Objective Swallowing Evaluation: Modified Barium Swallowing Study  Patient Details  Name: [name] MRN: [id number] Date of Birth: June 30, 1922  Today's Date: 04/08/2012 Time: 4098-1191 SLP Time Calculation (min): 27 min  Past Medical History:  Past Medical History  Diagnosis Date  . Diverticulosis of colon (without mention of hemorrhage) 06/24/2006  . Carcinoma of cecum 1992    History of  . Vitamin B12 deficiency   . Alzheimer's disease   . HTN (hypertension)   . Depression   . HLD (hyperlipidemia)   . Osteoporosis, unspecified   . PVD (peripheral vascular disease)   . CAD (coronary artery disease)   . GERD (gastroesophageal reflux disease)   . H. pylori infection 2004  . Presbyesophagus    Past Surgical History:  Past Surgical History  Procedure Laterality Date  . Gallbladder surgery    . Abdominal hysterectomy  1995    with bso  . Right colectomy    . Cataract extraction  2001   HPI:  [name] is a 79 y.o. female with history of hypertension, depression, dementia, history of colon cancer status post right colectomy, status post cholecystectomy who is a resident at assisted living facility presented to the ED per notes from the facility with a one-day history of worsening shortness of breath, hypoxia with sats of 85%, shortness of breath. On interview the patient patient states she's had a productive cough over the past month and shortness of breath over the past one to 2 months. Patient endorses some chills. Patient endorses some subjective fevers. Patient endorses some dysuria, constipation, generalized weakness. Patient endorses decreased oral intake. Patient denies any chest pain, no nausea, no vomiting, no abdominal pain.  BSE completed 04/07/12 with indications for instrumental testing due to aspiration concerns and diet was changed to dys2/nectar.  CXR 04/07/12 worsening right lower infiltrate since 04/06/12 xray.  Pt has h/o esophageal dysmotility per esophagram  09/2002 and has undergone EGD 10/02.   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   She has had recent hospital admits for falls, dizziness.       Assessment / Plan / Recommendation Clinical Impression  Dysphagia Diagnosis: Moderate oral phase dysphagia  Clinical impression: Pt presents with moderate oral, functional pharyngeal swallow.  Pt has known esophageal dysmotility per previous esophagram 2004.  No aspiration or deep laryngeal penetration observed.  Mild laryngeal penetration of thin x1 of approx 15 boluses cleared with further swallows.    Oral dysphagia characterized by decreased oral bolus coordination with lingual pumping resulting in delayed transiting and mild stasis. Oral residuals prematurely spill into pharynx with delayed swallow response-at times with pt requring verbal cues.  Pharyngeal swallow was strong without stasis.    Rec consider advancing diet to allow thin liquids.  SLP educated pt during testing to purpose of compensations and findings of test results - but given her dementia, she likely will not recall this information.    Please note, pt did not have her lower dentures in for test, which may have further impaired her mastication ability.  SLP to follow up for dysphagia management and to educate family to precautions to mitigate aspiration risk.  Pt was able to feed herself during testing, which will enhance her airway protection.   Contributing factors to pt's aspiration risk include her known presbyesophagus, GERD, weakness and cognitive status.     Treatment Recommendation  Therapy as outlined in treatment plan below    Diet Recommendation Dysphagia 2 (  Fine chop);Thin liquid   Liquid Administration via: Cup;Straw Medication Administration: Whole meds with puree Supervision: Full supervision/cueing for compensatory strategies;Staff feed patient Compensations: Small sips/bites;Follow solids with liquid;Slow rate (allow time for dry  swallow to clear stasis) Postural Changes and/or Swallow Maneuvers: Seated upright 90 degrees;Upright 30-60 min after meal    Other  Recommendations Oral Care Recommendations: Oral care QID   Follow Up Recommendations  Skilled Nursing facility;24 hour supervision/assistance    Frequency and Duration min 2x/week  2 weeks   Pertinent Vitals/Pain Low grade temperature    SLP Swallow Goals Patient will consume recommended diet without observed clinical signs of aspiration with:  (progressing) Goal #3: Family will state aspiration precautions and compensatory strategies to mitigate aspiration risk with mod I.    General HPI: [name] is a 79 y.o. female with history of hypertension, depression, dementia, history of colon cancer status post right colectomy, status post cholecystectomy who is a resident at assisted living facility presented to the ED per notes from the facility with a one-day history of worsening shortness of breath, hypoxia with sats of 85%, shortness of breath. On interview the patient patient states she's had a productive cough over the past month and shortness of breath over the past one to 2 months. Patient endorses some chills. Patient endorses some subjective fevers. Patient endorses some dysuria, constipation, generalized weakness. Patient endorses decreased oral intake. Patient denies any chest pain, no nausea, no vomiting, no abdominal pain.  BSE completed 04/07/12 with indications for instrumental testing due to aspiration concerns and diet was changed to dys2/nectar.  CXR 04/07/12 worsening right lower infiltrate since 04/06/12 xray.  Pt has h/o esophageal dysmotility per esophagram 09/2002 and has undergone EGD 10/02.   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   She has had recent hospital admits for falls, dizziness.   Type of Study: Modified Barium Swallowing Study Reason for Referral: Objectively evaluate swallowing function Diet  Prior to this Study: Dysphagia 2 (chopped);Nectar-thick liquids Temperature Spikes Noted: Yes Respiratory Status: Supplemental O2 delivered via (comment) History of Recent Intubation: No Behavior/Cognition: Alert;Confused;Distractible;Requires cueing;Decreased sustained attention;Hard of hearing Oral Cavity - Dentition: Dentures, top (bottom dentures not in currently, ? if in room) Oral Motor / Sensory Function: Impaired - see Bedside swallow eval Self-Feeding Abilities: Needs assist Patient Positioning: Upright in chair Baseline Vocal Quality: Clear Volitional Cough: Congested Volitional Swallow: Unable to elicit Anatomy: Within functional limits Pharyngeal Secretions: Not observed secondary MBS    Reason for Referral Objectively evaluate swallowing function   Oral Phase Oral Preparation/Oral Phase Oral Phase: Impaired Oral - Nectar Oral - Nectar Cup: Weak lingual manipulation;Piecemeal swallowing;Lingual/palatal residue;Lingual pumping;Delayed oral transit Oral - Thin Oral - Thin Teaspoon: Delayed oral transit;Weak lingual manipulation;Piecemeal swallowing;Lingual/palatal residue;Lingual pumping Oral - Thin Cup: Weak lingual manipulation;Delayed oral transit;Piecemeal swallowing;Lingual/palatal residue;Lingual pumping Oral - Thin Straw: Weak lingual manipulation;Delayed oral transit;Lingual pumping;Piecemeal swallowing Oral - Solids Oral - Puree: Delayed oral transit;Piecemeal swallowing;Lingual pumping;Lingual/palatal residue;Weak lingual manipulation Oral - Regular: Impaired mastication;Weak lingual manipulation;Delayed oral transit;Lingual/palatal residue;Lingual pumping Oral Phase - Comment Oral Phase - Comment: piecemealing/ oral residuals present across consistencies that spill into pharynx with delayed reflexive swallow aiding clearance,  (20% of times pt required verbal cues to swallow)    Pharyngeal Phase Pharyngeal Phase Pharyngeal Phase: Impaired Pharyngeal -  Nectar Pharyngeal - Nectar Cup: Within functional limits Pharyngeal - Thin Pharyngeal - Thin Teaspoon: Within functional limits Pharyngeal - Thin Cup: Penetration/Aspiration during swallow Penetration/Aspiration details (  thin cup): Material enters airway, remains ABOVE vocal cords then ejected out Pharyngeal - Thin Straw: Within functional limits Pharyngeal - Solids Pharyngeal - Puree: Premature spillage to valleculae Pharyngeal - Regular: Premature spillage to valleculae  Cervical Esophageal Phase    GO    Cervical Esophageal Phase Cervical Esophageal Phase: [facility] Cervical Esophageal Phase - Comment Cervical Esophageal Comment: Appearance of delayed clearance/stasis with cracker at mid-esophagus without pt sensation, thin liquid swallows aided clearance, radiologist not present to confirm - pt has known h/o dysmotility         Donavan Burnet, MS [facility] SLP [phone]

## 2012-04-08 NOTE — Progress Notes (Addendum)
CSW received call back from patient's son, Andy Moye (cell#: [phone]). Son is agreeable with patient going back to [facility] before returning to [facility] ALF. Facility aware & per Tammy, they are able to take patient when ready.   Clinical Social Work Department CLINICAL SOCIAL WORK PLACEMENT NOTE 04/08/2012  Patient:  [name], [name]  Account Number:  [id number] Admit date:  04/06/2012  Clinical Social Worker:  Orpah Greek  Date/time:  04/08/2012 02:28 PM  Clinical Social Work is seeking post-discharge placement for this patient at the following level of care:   SKILLED NURSING   (*CSW will update this form in Epic as items are completed)   04/08/2012  Patient/family provided with [facility] Department of Clinical Social Work's list of facilities offering this level of care within the geographic area requested by the patient (or if unable, by the patient's family).  04/08/2012  Patient/family informed of their freedom to choose among providers that offer the needed level of care, that participate in Medicare, Medicaid or managed care program needed by the patient, have an available bed and are willing to accept the patient.  04/08/2012  Patient/family informed of [facility]' ownership interest in [facility], as well as of the fact that they are under no obligation to receive care at this facility.  PASARR submitted to EDS on 04/08/2012 PASARR number received from EDS on 04/08/2012  FL2 transmitted to all facilities in geographic area requested by pt/family on  04/08/2012 FL2 transmitted to all facilities within larger geographic area on   Patient informed that his/her managed care company has contracts with or will negotiate with  certain facilities, including the following:     Patient/family informed of bed offers received:  04/08/2012 Patient chooses bed at [facility] [place] Physician recommends and patient  chooses bed at    Patient to be transferred to [facility] on  04/10/2012 Patient to be transferred to facility by ambulance Sharin Mons)  The following physician request were entered in Epic:   Additional Comments:  Unice Bailey, LCSW [employer] Clinical Social Worker cell #: [phone]   Jacklynn Lewis, MSW, LCSWA  Clinical Social Work [phone]

## 2012-04-08 NOTE — Progress Notes (Signed)
ANTIBIOTIC CONSULT NOTE - Brief note  Pharmacy Consult for Vancomycin Indication: pneumonia  Assessment: Vanc trough 7.3 mg/l on 500mg  q12h. Lower than expected and below target range.  Goal of Therapy:  Vancomycin trough level 15-20 mcg/ml  Plan:   Increase Vanc to 1g IV q12h.   F/u daily and recheck trough at steady-state.  Charolotte Eke, PharmD, pager [phone]. 04/08/2012,5:55 PM.

## 2012-04-08 NOTE — Evaluation (Signed)
Physical Therapy Evaluation Patient Details Name: [name] MRN: [id number] DOB: 17-Nov-1922 Today's Date: 04/08/2012 Time: 4098-1191 PT Time Calculation (min): 24 min  PT Assessment / Plan / Recommendation Clinical Impression  Pt. is 79 y/o female admitted from ALF with increased SOB, Hypoxia, fever. Pt. found with healthcare acquired aspiration pneumonia. Pt has h/o dementia and was able to follow directions and answer questions related to self. Pt. will benefit from PT while in acute care. Pt. may need higher level of care at DC, possibly SNF.    PT Assessment  Patient needs continued PT services    Follow Up Recommendations  SNF    Does the patient have the potential to tolerate intense rehabilitation      Barriers to Discharge        Equipment Recommendations  None recommended by PT    Recommendations for Other Services     Frequency Min 3X/week    Precautions / Restrictions Precautions Precautions: Fall Precaution Comments: incontinence Restrictions Weight Bearing Restrictions: No   Pertinent Vitals/Pain sats >93% on RA during mobility HR 72-73 BP164/32      Mobility  Bed Mobility Bed Mobility: Rolling Right;Right Sidelying to Sit;Sitting - Scoot to [place] of Bed;Sit to Supine Rolling Right: 3: Mod assist Right Sidelying to Sit: 3: Mod assist Supine to Sit: 4: Min assist Sitting - Scoot to Edge of Bed: 3: Mod assist Sit to Supine: 1: +2 Total assist Sit to Supine: Patient Percentage: 30% Transfers Transfers: Sit to Stand;Stand to Sit;Stand Pivot Transfers Sit to Stand: 1: +2 Total assist;From bed;From chair/3-in-1 Sit to Stand: Patient Percentage: 50% Stand to Sit: 1: +2 Total assist Stand to Sit: Patient Percentage: 60% Stand Pivot Transfers: 1: +2 Total assist Stand Pivot Transfers: Patient Percentage: 60% Details for Transfer Assistance: with use of RW  , pt was able to take steps to pivot to [facility] then back to bed. cues for use of RW, tactile  Cues  to flex knees prior to stand. Pt. has posterior lean propensity. Ambulation/Gait Ambulation/Gait Assistance: Not tested (comment)    Exercises     PT Diagnosis: Difficulty walking;Generalized weakness  PT Problem List: Decreased strength;Decreased range of motion;Decreased activity tolerance;Decreased balance;Decreased mobility;Decreased cognition;Decreased safety awareness;Decreased knowledge of use of DME PT Treatment Interventions: DME instruction;Gait training;Functional mobility training;Therapeutic activities;Therapeutic exercise;Patient/family education   PT Goals Acute Rehab PT Goals PT Goal Formulation: Patient unable to participate in goal setting Time For Goal Achievement: 04/22/12 Potential to Achieve Goals: Fair Pt will go Supine/Side to Sit: with supervision PT Goal: Supine/Side to Sit - Progress: Goal set today Pt will go Sit to Supine/Side: with supervision PT Goal: Sit to Supine/Side - Progress: Goal set today Pt will go Sit to Stand: with min assist PT Goal: Sit to Stand - Progress: Goal set today Pt will go Stand to Sit: with min assist PT Goal: Stand to Sit - Progress: Goal set today Pt will Transfer Bed to Chair/Chair to Bed: with min assist PT Transfer Goal: Bed to Chair/Chair to Bed - Progress: Goal set today Pt will Ambulate: 16 - 50 feet;with min assist;with rolling walker PT Goal: Ambulate - Progress: Goal set today  Visit Information  Last PT Received On: 04/08/12 Assistance Needed: +2    Subjective Data  Subjective: I was 26(when asked when she came to the states) Patient Stated Goal:  agreed to OOB.   Prior Functioning  Home Living Lives With:  (alf per chart:  may have been at [facility]) Type of  Home: Assisted living Additional Comments: no family present to give info related to pt's function. Prior Function Level of Independence:  (no info available) Communication Communication: No difficulties Dominant Hand: Right    Cognition   Cognition Overall Cognitive Status: Impaired Area of Impairment: Memory;Problem solving;Following commands Arousal/Alertness: Awake/alert Orientation Level: Disoriented to;Place;Time;Situation Behavior During Session: [facility] for tasks performed Following Commands: Follows one step commands consistently Cognition - Other Comments: Pt. was able to participate in functional mobility today. answered questions related to self.    Extremity/Trunk Assessment Right Upper Extremity Assessment RUE ROM/Strength/Tone: WFL for tasks assessed (to 90) Left Upper Extremity Assessment LUE ROM/Strength/Tone: WFL for tasks assessed (to 90) Right Lower Extremity Assessment RLE ROM/Strength/Tone: Deficits RLE ROM/Strength/Tone Deficits: at least 3+ for standing at RW and pivoting. Left Lower Extremity Assessment LLE ROM/Strength/Tone: Deficits LLE ROM/Strength/Tone Deficits: same as R   Balance Balance Balance Assessed: Yes Static Sitting Balance Static Sitting - Balance Support: Feet supported;Bilateral upper extremity supported Static Sitting - Level of Assistance: 5: Stand by assistance Static Sitting - Comment/# of Minutes: sat at edge of bed  with no balance loss.  End of Session PT - End of Session Equipment Utilized During Treatment: Gait belt Activity Tolerance: Patient tolerated treatment well Patient left: in bed;with call bell/phone within reach;with bed alarm set;with nursing in room Nurse Communication: Mobility status  GP     Rada Hay 04/08/2012, 10:25 AM [phone]

## 2012-04-08 NOTE — Clinical Social Work Psychosocial (Signed)
Clinical Social Work Department BRIEF PSYCHOSOCIAL ASSESSMENT 04/08/2012  Patient:  [name], [name]     Account Number:  [id number]     Admit date:  04/06/2012  Clinical Social Worker:  Vennie Homans, LCSWA  Date/Time:     Referred by:  CSW  Date Referred:  04/08/2012 Referred for  Other - See comment   Other Referral:   CSW noted Pt admitted from ALF, now may need SNF.   Interview type:  Other - See comment Other interview type:   chart review, phone call to ALF    PSYCHOSOCIAL DATA Living Status:  FACILITY Admitted from facility:  [facility] Level of care:  Assisted Living Primary support name:  Nored "Len" Dimas Aguas Primary support relationship to patient:  CHILD, ADULT Degree of support available:   adequate, son lives out of state    CURRENT CONCERNS Current Concerns  Post-Acute Placement   Other Concerns:   Pt was just at [facility] in December, then back to ALF, now needs SNF again    SOCIAL WORK ASSESSMENT / PLAN CSW reviewed chart and left VM for son, Marolyn Haller who lives out of state. CSW awaits his return call. CSW spoke with Britta Mccreedy at [facility] where Pt has been resident since 2011. CSW noted PT and OT notes that suggest Pt go to SNF after this admission.   Assessment/plan status:  Psychosocial Support/Ongoing Assessment of Needs Other assessment/ plan:   SNF placement   Information/referral to community resources:    PATIENT'S/FAMILY'S RESPONSE TO PLAN OF CARE: CSW awaits son's call to discuss need for SNF from PT and OT. CSW noted poor prognosis in MD note as well. CSW to follow and assist with dispo whether SNF or hospice placement.     Doreen Salvage, LCSW ICU/Stepdown Clinical Social Worker [facility] Cell [phone] Hours 8am-1200pm M-F

## 2012-04-08 NOTE — Evaluation (Signed)
Occupational Therapy Evaluation Patient Details Name: [name] MRN: [id number] DOB: 08-08-1922 Today's Date: 04/08/2012 Time: 6578-4696 OT Time Calculation (min): 28 min  OT Assessment / Plan / Recommendation Clinical Impression  this 79 year old female with h/o dementia was admitted for sepsis, with fever of 104.  She has a PMH significant for colon CA and chest xrays showed T 12, rib and sternal fxs.  Pt is appropriate for skilled OT to increase independence and safety wtih adls.      OT Assessment  Patient needs continued OT Services    Follow Up Recommendations  SNF    Barriers to Discharge      Equipment Recommendations  3 in 1 bedside comode    Recommendations for Other Services    Frequency  Min 2X/week    Precautions / Restrictions Precautions Precautions: Fall Precaution Comments: incontinence Restrictions Weight Bearing Restrictions: No   Pertinent Vitals/Pain No c/o pain.  HR 72-73; sats 93-94% RA.  Initial BP 193/63 after therapy 164/32.  No c/o dizziness    ADL  Eating/Feeding: Supervision/safety (modified diet and cues) Where Assessed - Eating/Feeding: Bed level Grooming: Wash/dry face;Brushing hair;Minimal assistance (for hair) Where Assessed - Grooming: Unsupported sitting Upper Body Bathing: Minimal assistance Where Assessed - Upper Body Bathing: Unsupported sitting Lower Body Bathing: +2 Total assistance Lower Body Bathing: Patient Percentage: 40% Where Assessed - Lower Body Bathing: Supported sit to stand Upper Body Dressing: Minimal assistance Where Assessed - Upper Body Dressing: Unsupported sitting Lower Body Dressing: +2 Total assistance Lower Body Dressing: Patient Percentage: 30% Where Assessed - Lower Body Dressing: Supported sit to stand Toilet Transfer: +2 Total assistance Toilet Transfer: Patient Percentage:50- 60% Toilet Transfer Method: Surveyor, minerals: Materials engineer and  Hygiene: +2 Total assistance Toileting - Architect and Hygiene: Patient Percentage: 0% Where Assessed - Toileting Clothing Manipulation and Hygiene: Sit to stand from 3-in-1 or toilet Equipment Used: Gait belt;Rolling walker Transfers/Ambulation Related to ADLs: spt only.  Pt did better with second transfer:  initially leaned posteriorly and small steps ADL Comments: cues for attention to task.  Pt has been taking socks off in bed.  Lines covered. Pt fatiques easily    OT Diagnosis: Generalized weakness  OT Problem List: Decreased strength;Decreased activity tolerance;Impaired balance (sitting and/or standing);Decreased cognition;Decreased safety awareness OT Treatment Interventions: Self-care/ADL training;DME and/or AE instruction;Therapeutic activities;Patient/family education;Balance training;Cognitive remediation/compensation   OT Goals Acute Rehab OT Goals OT Goal Formulation: Patient unable to participate in goal setting Time For Goal Achievement: 04/22/12 Potential to Achieve Goals: Fair ADL Goals Pt Will Perform Grooming: with min assist;Standing at sink (min guard) ADL Goal: Grooming - Progress: Goal set today Pt Will Perform Upper Body Bathing: with supervision;Sitting, chair (no more than 1 vc) Pt Will Perform Lower Body Bathing: with min assist;Sit to stand from chair (with min vcs) ADL Goal: Lower Body Bathing - Progress: Goal set today Pt Will Perform Lower Body Dressing: with mod assist;Sit to stand from chair;with cueing (comment type and amount) (min vcs) ADL Goal: Lower Body Dressing - Progress: Goal set today Pt Will Transfer to Toilet: with min assist;Stand pivot transfer;3-in-1 ADL Goal: Toilet Transfer - Progress: Goal set today Miscellaneous OT Goals Miscellaneous OT Goal #1: pt will sustain attention to task for 2 minutes without cueing in quiet environment OT Goal: Miscellaneous Goal #1 - Progress: Goal set today  Visit Information  Last OT Received  On: 04/08/12 Assistance Needed: +2 PT/OT Co-Evaluation/Treatment: Yes  Subjective Data  Patient Stated Goal: agreeable to ot/pt   Prior Functioning     Home Living Lives With:  (alf per chart:  may have been at [facility]) Type of Home: Assisted living Additional Comments: no family present to give info related to pt's function. Prior Function Level of Independence:  (no info available) Communication Communication: No difficulties Dominant Hand: Right         Vision/Perception     Cognition  Cognition Overall Cognitive Status: Impaired Area of Impairment: Memory;Problem solving;Following commands Arousal/Alertness: Awake/alert Orientation Level: Disoriented to;Place;Time;Situation Behavior During Session: [facility] for tasks performed Following Commands: Follows one step commands consistently Cognition - Other Comments: Pt. was able to participate in functional mobility today. answered questions related to self.    Extremity/Trunk Assessment Right Upper Extremity Assessment RUE ROM/Strength/Tone: WFL for tasks assessed (to 90) Left Upper Extremity Assessment LUE ROM/Strength/Tone: WFL for tasks assessed (to 90) Right Lower Extremity Assessment RLE ROM/Strength/Tone: Deficits RLE ROM/Strength/Tone Deficits: at least 3+ for standing at RW and pivoting. Left Lower Extremity Assessment LLE ROM/Strength/Tone: Deficits LLE ROM/Strength/Tone Deficits: same as R     Mobility Bed Mobility Bed Mobility: Rolling Right;Right Sidelying to Sit;Sitting - Scoot to [place] of Bed;Sit to Supine Rolling Right: 3: Mod assist Right Sidelying to Sit: 3: Mod assist Supine to Sit: 4: Min assist Sitting - Scoot to Edge of Bed: 3: Mod assist Sit to Supine: 1: +2 Total assist Sit to Supine: Patient Percentage: 30% Transfers Transfers: Sit to Stand Sit to Stand: 1: +2 Total assist;From bed;From chair/3-in-1 Sit to Stand: Patient Percentage: 50% Stand to Sit: 1: +2 Total assist Stand to Sit:  Patient Percentage: 60% Details for Transfer Assistance: with use of RW  , pt was able to take steps to pivot to [facility] then back to bed. cues for use of RW, flexing knees prior to stand. Pt. has posterior lean propensity.     Exercise     Balance Balance Balance Assessed: Yes Static Sitting Balance Static Sitting - Balance Support: Feet supported;Bilateral upper extremity supported Static Sitting - Level of Assistance: 5: Stand by assistance Static Sitting - Comment/# of Minutes: sat at edge of bed  with no balance loss.   End of Session OT - End of Session Activity Tolerance: Patient limited by fatigue Patient left: in bed;with call bell/phone within reach;with bed alarm set (for safety)  GO     Anne Boltz 04/08/2012, 10:23 AM Marica Otter, OTR/L [phone] 04/08/2012

## 2012-04-09 NOTE — Progress Notes (Signed)
Patient ID: [name], female   DOB: October 20, 1922, 79 y.o.   MRN: [id number]  [facility] PROGRESS NOTE  COLLIER MONICA WUJ:[id number] DOB: [birth year] DOA: 04/06/2012 PCP: [name], MD  Brief narrative: Pt is 79 yo female with with advanced dementia, admitted 3/16 with new onset sob/ hypoxemic with presumed aspiration pneumonia with temp up to 104 F and unable to cough up secretions effectively. PCCM service consulted for possible bronchoscopy 3/17.  1. Healthcare associated aspiration pneumonia causing sepsis, fevers and acute respiratory failure -  CT of the chest clearly indicating severe pneumonia in right middle and lower lobes - SLP recommendation is to continue with aspiration precautions and dysphagia II diet - continue current IV ABX - no need for FOB at this time 2. Hypertension - Poor control, added beta blocker and Norvasc along with when necessary IV hydralazine for better control.  - continue to monitor 3. History of CAD - No acute issues, cycle troponins are negative, chest pain-free, continue aspirin and statin, continue beta blocker  4. History of dementia, depression, GERD - continue Aricept, Cymbalta, Depakote along with PPI, respectively  5. Dysphagia.  - Dysphagia 2 diet, feeding with assistance and aspiration precautions 6. Low potassium  - replaced & stable.     Consultants:  PCCM  Procedures/Studies: Dg Swallowing Func-speech Pathology 03/18 --> dysphagia II diet recommended   Antibiotics:  Vancomycin 3/16 -->  Maxipime 3/16 -->  Code Status: DNR Family Communication: No family at bedside  Disposition Plan: Transfer to medical floor  HPI/Subjective: No events overnight.   Objective: Filed Vitals:   04/09/12 0955 04/09/12 1000 04/09/12 1400 04/09/12 1600  BP: 176/78 141/99 173/39   Pulse: 75 77 60   Temp:  99 F (37.2 C) 98.4 F (36.9 C)   TempSrc:    Core (Comment)  Resp:  21 19   Height:      Weight:      SpO2:  96% 93%      Intake/Output Summary (Last 24 hours) at 04/09/12 1720 Last data filed at 04/09/12 1640  Gross per 24 hour  Intake 1476.67 ml  Output    500 ml  Net 976.67 ml    Exam:   General:  Pt is alert and oriented to name only, follows commands appropriately, not in acute distress  Cardiovascular: Regular rate and rhythm, S1/S2, no murmurs, no rubs, no gallops  Respiratory: Bilateral rhonchi R> L  Abdomen: Soft, non tender, non distended, bowel sounds present, no guarding  Extremities: No edema, pulses DP and PT palpable bilaterally  Neuro: Grossly nonfocal  Data Reviewed: Basic Metabolic Panel:  Recent Labs Lab 04/06/12 1430 04/06/12 1725 04/07/12 0202 04/08/12 0646  NA 137  --  135 138  K 3.5  --  2.9* 3.5  CL 97  --  101 106  CO2 27  --  24 23  GLUCOSE 110*  --  104* 78  BUN 22  --  19 13  CREATININE 0.81  --  0.76 0.65  CALCIUM 9.4  --  8.0* 8.7  MG  --  1.9  --  2.0   Liver Function Tests:  Recent Labs Lab 04/06/12 1430 04/07/12 0202  AST 61* 45*  ALT 41* 39*  ALKPHOS 69 52  BILITOT 0.8 0.7  PROT 6.9 5.4*  ALBUMIN 3.7 2.7*   CBC:  Recent Labs Lab 04/06/12 1430 04/07/12 0202 04/08/12 0646  WBC 9.7 9.7 8.5  NEUTROABS 8.2* 7.9*  --   HGB 12.3  9.8* 10.1*  HCT 38.5 30.1* 30.7*  MCV 86.5 85.8 84.6  PLT 176 131* 124*   Cardiac Enzymes:  Recent Labs Lab 04/06/12 1430 04/06/12 2055 04/07/12 0202  TROPONINI <0.30 <0.30 <0.30    Recent Results (from the past 240 hour(s))  CULTURE, BLOOD (ROUTINE X 2)     Status: None   Collection Time    04/06/12  2:21 PM      Result Value Range Status   Specimen Description BLOOD RIGHT ARM   Final   Special Requests BOTTLES DRAWN AEROBIC AND ANAEROBIC 4CC EACH   Final   Culture  Setup Time 04/06/2012 20:12   Final   Culture     Final   Value:        BLOOD CULTURE RECEIVED NO GROWTH TO DATE CULTURE WILL BE HELD FOR 5 DAYS BEFORE ISSUING A FINAL NEGATIVE REPORT   Report Status PENDING   Incomplete   CULTURE, BLOOD (ROUTINE X 2)     Status: None   Collection Time    04/06/12  2:35 PM      Result Value Range Status   Specimen Description BLOOD RIGHT ARM   Final   Special Requests BOTTLES DRAWN AEROBIC AND ANAEROBIC 3CC EACH   Final   Culture  Setup Time 04/06/2012 20:12   Final   Culture     Final   Value:        BLOOD CULTURE RECEIVED NO GROWTH TO DATE CULTURE WILL BE HELD FOR 5 DAYS BEFORE ISSUING A FINAL NEGATIVE REPORT   Report Status PENDING   Incomplete  MRSA PCR SCREENING     Status: None   Collection Time    04/06/12  5:15 PM      Result Value Range Status   MRSA by PCR NEGATIVE  NEGATIVE Final   Comment:            The GeneXpert MRSA Assay (FDA     approved for NASAL specimens     only), is one component of a     comprehensive MRSA colonization     surveillance program. It is not     intended to diagnose MRSA     infection nor to guide or     monitor treatment for     MRSA infections.     Scheduled Meds: . amLODipine  10 mg Oral Daily  . aspirin  81 mg Oral Daily  . atorvastatin  5 mg Oral QHS  . ceFEPime (MAXIPIME) IV  1 g Intravenous Q24H  . divalproex  125 mg Oral BID  . donepezil  10 mg Oral QHS  . DULoxetine  60 mg Oral q morning - 10a  . enoxaparin  injection  40 mg Subcutaneous Q24H  . ferrous sulfate  325 mg Oral Q breakfast  . guaiFENesin  1,200 mg Oral BID  . metoprolol tartrate  50 mg Oral BID  . pantoprazole  40 mg Oral Daily  . sucralfate  1 g Oral TID WC & HS  . vancomycin  1,000 mg Intravenous Q12H  . vitamin B-12  1,000 mcg Oral QODAY   Continuous Infusions: . sodium chloride 20 mL/hr at 04/09/12 1640     Debbora Presto, MD  [facility] Pager [phone]  If 7PM-7AM, please contact night-coverage [email or web address] Password [email or web address] 04/09/2012, 5:20 PM   LOS: 3 days

## 2012-04-09 NOTE — Progress Notes (Signed)
Physical Therapy Treatment Patient Details Name: [name] MRN: [id number] DOB: Feb 13, 1922 Today's Date: 04/09/2012 Time: 7829-5621 PT Time Calculation (min): 23 min  PT Assessment / Plan / Recommendation Comments on Treatment Session  pt able to ambulate in hallway today with +2 assist for safety.  Again, pt able to follow commands mostly WFL during session.  Continue to recommend SNF at D/c.     Follow Up Recommendations  SNF     Does the patient have the potential to tolerate intense rehabilitation     Barriers to Discharge        Equipment Recommendations  None recommended by PT    Recommendations for Other Services    Frequency Min 3X/week   Plan Discharge plan remains appropriate    Precautions / Restrictions Precautions Precautions: Fall Precaution Comments: incontinence Restrictions Weight Bearing Restrictions: No   Pertinent Vitals/Pain Pt denies pain during session.     Mobility  Bed Mobility Bed Mobility: Supine to Sit Supine to Sit: 1: +2 Total assist Supine to Sit: Patient Percentage: 50% Sitting - Scoot to Edge of Bed: 3: Mod assist Details for Bed Mobility Assistance: Assist to initiate movement of LEs out of bed and assist for trunk to attain sitting position.  Max cues for participation and technique.  Transfers Transfers: Sit to Stand;Stand to Sit;Stand Pivot Transfers Sit to Stand: 1: +2 Total assist;From bed Sit to Stand: Patient Percentage: 50% Stand to Sit: 1: +2 Total assist;To chair/3-in-1;With armrests Stand to Sit: Patient Percentage: 60% Details for Transfer Assistance: Assist to rise and steady with cues for hand placement and safety.  Ambulation/Gait Ambulation/Gait Assistance: 1: +2 Total assist Ambulation/Gait: Patient Percentage: 60% Ambulation Distance (Feet): 100 Feet Assistive device: Rolling walker Ambulation/Gait Assistance Details: Assist to steady througout with cues for sequencing/technique with RW (staying close to RW),  having wider steps and keeping upright posture.  HR in upper 70's throughout.  SaO2 varied as O2 sensor was on toe.  Gait Pattern: Step-through pattern;Decreased stride length;Scissoring;Narrow base of support;Trunk flexed    Exercises     PT Diagnosis:    PT Problem List:   PT Treatment Interventions:     PT Goals Acute Rehab PT Goals PT Goal Formulation: Patient unable to participate in goal setting Time For Goal Achievement: 04/22/12 Potential to Achieve Goals: Fair Pt will go Supine/Side to Sit: with supervision PT Goal: Supine/Side to Sit - Progress: Progressing toward goal Pt will go Sit to Stand: with min assist PT Goal: Sit to Stand - Progress: Progressing toward goal Pt will go Stand to Sit: with min assist PT Goal: Stand to Sit - Progress: Progressing toward goal Pt will Transfer Bed to Chair/Chair to Bed: with min assist PT Transfer Goal: Bed to Chair/Chair to Bed - Progress: Progressing toward goal Pt will Ambulate: 51 - 150 feet;with min assist;with least restrictive assistive device PT Goal: Ambulate - Progress: Updated due to goal met  Visit Information  Last PT Received On: 04/09/12 Assistance Needed: +2    Subjective Data  Subjective: I don't know if I'm ready for dancing.  Patient Stated Goal:  agreed to OOB.   Cognition  Cognition Overall Cognitive Status: Impaired Area of Impairment: Memory;Problem solving;Following commands Arousal/Alertness: Awake/alert Orientation Level: Disoriented to;Place;Time;Situation Behavior During Session: [facility] for tasks performed Memory: Decreased recall of precautions Following Commands: Follows one step commands consistently Cognition - Other Comments: Pt. was able to participate in functional mobility today. answered questions related to self.    Balance  End of Session PT - End of Session Equipment Utilized During Treatment: Gait belt Activity Tolerance: Patient tolerated treatment well Patient left: in chair;with  call bell/phone within reach Nurse Communication: Mobility status   GP     Vista Deck 04/09/2012, 4:47 PM

## 2012-04-10 LAB — BASIC METABOLIC PANEL
BUN: 11 mg/dL (ref 6–23)
Calcium: 9 mg/dL (ref 8.4–10.5)
GFR calc Af Amer: 89 mL/min — ABNORMAL LOW (ref >90)
GFR calc non Af Amer: 77 mL/min — ABNORMAL LOW (ref >90)
Potassium: 2.9 mEq/L — ABNORMAL LOW (ref 3.5–5.1)

## 2012-04-10 LAB — CBC
HCT: 30.5 % — ABNORMAL LOW (ref 36.0–46.0)
MCH: 27.9 pg (ref 26.0–34.0)
MCHC: 33.4 g/dL (ref 30.0–36.0)
RDW: 14.1 % (ref 11.5–15.5)

## 2012-04-10 MED ORDER — DEXTROSE 5 % IV SOLN
1.0000 g | Freq: Two times a day (BID) | INTRAVENOUS | Status: DC
  Administered 2012-04-10: 1 g via INTRAVENOUS
  Filled 2012-04-10 (×2): qty 1

## 2012-04-10 MED ORDER — LEVOFLOXACIN 500 MG PO TABS: 500.0000 mg | ORAL_TABLET | Freq: Every day | ORAL | Status: DC

## 2012-04-10 MED ORDER — AMLODIPINE BESYLATE 10 MG PO TABS: 10.0000 mg | ORAL_TABLET | Freq: Every day | ORAL | Status: DC

## 2012-04-10 MED ORDER — LORAZEPAM 0.5 MG PO TABS: 0.2500 mg | ORAL_TABLET | Freq: Three times a day (TID) | ORAL | Status: DC

## 2012-04-10 MED ORDER — METOPROLOL TARTRATE 50 MG PO TABS: 50.0000 mg | ORAL_TABLET | Freq: Two times a day (BID) | ORAL | Status: DC

## 2012-04-10 MED ORDER — POTASSIUM CHLORIDE CRYS ER 20 MEQ PO TBCR
40.0000 meq | EXTENDED_RELEASE_TABLET | Freq: Two times a day (BID) | ORAL | Status: DC
  Administered 2012-04-10: 40 meq via ORAL
  Filled 2012-04-10 (×2): qty 2

## 2012-04-10 NOTE — Progress Notes (Signed)
Physical Therapy Treatment Patient Details Name: DARENDA FIKE MRN: [id number] DOB: [birth year] Today's Date: 04/10/2012 Time: 4098-1191 PT Time Calculation (min): 26 min  PT Assessment / Plan / Recommendation Comments on Treatment Session  Assisted pt OOB to amb in hallway + 2 assist.  Pt progressing slowly and would benefit from ST Rehab at SNF prior to D/C back to ALF.    Follow Up Recommendations  SNF     Does the patient have the potential to tolerate intense rehabilitation     Barriers to Discharge        Equipment Recommendations  None recommended by PT    Recommendations for Other Services    Frequency Min 3X/week   Plan Discharge plan remains appropriate    Precautions / Restrictions Precautions Precautions: Fall Precaution Comments: oxygen Restrictions Weight Bearing Restrictions: No   Pertinent Vitals/Pain No c/o pain    Mobility  Bed Mobility Bed Mobility: Supine to Sit Supine to Sit: 1: +2 Total assist Supine to Sit: Patient Percentage: 50% Sitting - Scoot to Edge of Bed: 2: Max assist Details for Bed Mobility Assistance: Assist to initiate movement of LEs out of bed and assist for trunk to attain sitting position.  Max cues for participation and technique.  Transfers Transfers: Sit to Stand;Stand to Sit Sit to Stand: 1: +2 Total assist;From bed Sit to Stand: Patient Percentage: 50% Stand to Sit: 1: +2 Total assist;To chair/3-in-1;With armrests Stand to Sit: Patient Percentage: 60% Details for Transfer Assistance: Assist to rise and steady with cues for hand placement and safety.  Ambulation/Gait Ambulation/Gait Assistance: 1: +2 Total assist Ambulation/Gait: Patient Percentage: 60% Ambulation Distance (Feet): 15 Feet Assistive device: Rolling walker Ambulation/Gait Assistance Details: 75% VC's on proper walker to self distance and upright posture as pt tends to push walker out too far forward. Gait Pattern: Step-through pattern;Decreased stride  length;Scissoring;Narrow base of support;Trunk flexed Gait velocity: decreased     PT Goals                                                  progressing    Visit Information  Last PT Received On: 04/10/12 Assistance Needed: +2    Subjective Data      Cognition       Balance   poor  End of Session PT - End of Session Equipment Utilized During Treatment: Gait belt Activity Tolerance: Patient tolerated treatment well Patient left: in chair;with call bell/phone within reach Nurse Communication: Mobility status   Felecia Shelling  PTA [facility]  Acute  Rehab Pager      [phone]

## 2012-04-10 NOTE — Discharge Summary (Signed)
Physician Discharge Summary  [name] YQM:[id number] DOB: Jul 10, 1922 DOA: 04/06/2012  PCP: [name], MD  Admit date: 04/06/2012 Discharge date: 04/10/2012  Recommendations for Outpatient Follow-up:  1. Pt will need to follow up with PCP in 2-3 weeks post discharge 2. Please obtain BMP to evaluate electrolytes and kidney function, please note that pt was given 2 doses of K-dur 40 MEQ prior to discharge  3. Please also check CBC to evaluate Hg and Hct levels 4. Please note that pt was discharged on Levaquin to complete the therapy for 7 more days  5. Please note that pt was started on Norvasc and Metoprolol for better BP control   Discharge Diagnoses: Acute respiratory hypoxic failure secondary to aspiration PNA  Principal Problem:   Sepsis Active Problems:   GERD (gastroesophageal reflux disease)   HTN (hypertension)   Dysphagia   Dementia   Acute respiratory distress   Fever   Dehydration   Sinus tachycardia   Depression   CAD (coronary artery disease)   HCAP (healthcare-associated pneumonia)  Discharge Condition: Stable  Diet recommendation: Dysphagia 2 diet with aspiration precautions   Brief narrative:  Pt is 79 yo female with with advanced dementia, admitted 3/16 with new onset sob/ hypoxemic with presumed aspiration pneumonia with temp up to 104 F and unable to cough up secretions effectively. PCCM service consulted for possible bronchoscopy 3/17.   1. Healthcare associated aspiration pneumonia causing sepsis, fevers and acute respiratory failure  - CT of the chest clearly indicating severe pneumonia in right middle and lower lobes  - SLP recommendation is to continue with aspiration precautions and dysphagia II diet  - continued Levaquin and Maxipime for 3 days and will now switch to Levaquin as pt is clinically improving  - no need for FOB at this time  2. Hypertension  - Poor control, added beta blocker and Norvasc  - continue to monitor and readjust the  regimen in next 1-2 weeks based on the level of control   3. History of CAD  - No acute issues, cycle troponins are negative, chest pain-free, continue aspirin and statin, continue beta blocker Metoprolol  4. History of dementia, depression, GERD  - continue Aricept, Cymbalta, Depakote along with PPI, respectively  5. Dysphagia.  - Dysphagia 2 diet, feeding with assistance and aspiration precautions  6. Low potassium  - replaced with 2 doses of Kdur 40 meq PO prior to discharge  Consultants:  PCCM Procedures/Studies:  Dg Swallowing Func-speech Pathology 03/18 --> dysphagia II diet recommended  Antibiotics:  Vancomycin 3/16 --> 3/20 Maxipime 3/16 --> 3/20  Code Status: DNR   Discharge Exam: Filed Vitals:   04/10/12 0735  BP: 177/58  Pulse: 72  Temp: 98.6 F (37 C)  Resp: 16   Filed Vitals:   04/09/12 1600 04/09/12 1810 04/09/12 2128 04/10/12 0735  BP:  144/45 170/60 177/58  Pulse:  69 70 72  Temp:  97.4 F (36.3 C) 98.2 F (36.8 C) 98.6 F (37 C)  TempSrc: Core (Comment) Oral Oral Oral  Resp:  16 16 16   Height:  5\' 1"  (1.549 m)    Weight:  58.242 kg (128 lb 6.4 oz)    SpO2:  95% 93% 92%    General: Pt is alert but oriented to name only, follows commands appropriately, not in acute distress Cardiovascular: Regular rate and rhythm, S1/S2 +, no murmurs, no rubs, no gallops Respiratory: Clear to auscultation bilaterally, no wheezing, no crackles, bilateral rhonchi R>L and mostly at bases,  improved since yesterday  Abdominal: Soft, non tender, non distended, bowel sounds +, no guarding Extremities: no edema, no cyanosis, pulses palpable bilaterally DP and PT Neuro: Grossly nonfocal  Discharge Instructions  Discharge Orders   Future Orders Complete By Expires     Diet - low sodium heart healthy  As directed     Increase activity slowly  As directed         Medication List    TAKE these medications       acetaminophen 325 MG tablet  Commonly known as:  TYLENOL   Take 650 mg by mouth 3 (three) times daily. At 9am, 3pm, and 9pm     alendronate 70 MG tablet  Commonly known as:  FOSAMAX  Take 70 mg by mouth every 7 (seven) days. Take on Saturdays with a full glass of water on an empty stomach.     amLODipine 10 MG tablet  Commonly known as:  NORVASC  Take 1 tablet (10 mg total) by mouth daily.     aspirin 81 MG tablet  Take 81 mg by mouth daily.     atorvastatin 10 MG tablet  Commonly known as:  LIPITOR  Take 5 mg by mouth at bedtime.     Calcium-Vitamin D 600-200 MG-UNIT per tablet  Take 1 tablet by mouth every morning.     cholecalciferol 400 UNITS Tabs  Commonly known as:  VITAMIN D  Take 400 Units by mouth every morning.     divalproex 125 MG capsule  Commonly known as:  DEPAKOTE SPRINKLE  Take 125 mg by mouth 2 (two) times daily. At 9am and 9pm     donepezil 10 MG tablet  Commonly known as:  ARICEPT  Take 10 mg by mouth at bedtime.     DULoxetine 60 MG capsule  Commonly known as:  CYMBALTA  Take 60 mg by mouth every morning.     ferrous sulfate 325 (65 FE) MG tablet  Take 325 mg by mouth daily with breakfast.     hydrochlorothiazide 12.5 MG capsule  Commonly known as:  MICROZIDE  Take 12.5 mg by mouth every morning.     levofloxacin 500 MG tablet  Commonly known as:  LEVAQUIN  Take 1 tablet (500 mg total) by mouth daily.     lisinopril 10 MG tablet  Commonly known as:  PRINIVIL,ZESTRIL  Take 10 mg by mouth every morning.     LORazepam 0.5 MG tablet  Commonly known as:  ATIVAN  Take 0.25 mg by mouth 3 (three) times daily. At 9am, 3pm, and 9pm     metoprolol 50 MG tablet  Commonly known as:  LOPRESSOR  Take 1 tablet (50 mg total) by mouth 2 (two) times daily.     multivitamin with minerals Tabs  Take 1 tablet by mouth every morning.     sucralfate 1 GM/10ML suspension  Commonly known as:  CARAFATE  Take 1 g by mouth 4 (four) times daily -  with meals and at bedtime. At 8:30am, 11:30am, 4:30pm, and 9pm      vitamin B-12 1000 MCG tablet  Commonly known as:  CYANOCOBALAMIN  Take 1,000 mcg by mouth every other day.           Follow-up Information   Follow up with [name], MD In 2 weeks.   Contact information:   [address] [place] [address] [phone]        The results of significant diagnostics from this  hospitalization (including imaging, microbiology, ancillary and laboratory) are listed below for reference.     Microbiology: Recent Results (from the past 240 hour(s))  CULTURE, BLOOD (ROUTINE X 2)     Status: None   Collection Time    04/06/12  2:21 PM      Result Value Range Status   Specimen Description BLOOD RIGHT ARM   Final   Special Requests BOTTLES DRAWN AEROBIC AND ANAEROBIC 4CC EACH   Final   Culture  Setup Time 04/06/2012 20:12   Final   Culture     Final   Value:        BLOOD CULTURE RECEIVED NO GROWTH TO DATE CULTURE WILL BE HELD FOR 5 DAYS BEFORE ISSUING A FINAL NEGATIVE REPORT   Report Status PENDING   Incomplete  CULTURE, BLOOD (ROUTINE X 2)     Status: None   Collection Time    04/06/12  2:35 PM      Result Value Range Status   Specimen Description BLOOD RIGHT ARM   Final   Special Requests BOTTLES DRAWN AEROBIC AND ANAEROBIC 3CC EACH   Final   Culture  Setup Time 04/06/2012 20:12   Final   Culture     Final   Value:        BLOOD CULTURE RECEIVED NO GROWTH TO DATE CULTURE WILL BE HELD FOR 5 DAYS BEFORE ISSUING A FINAL NEGATIVE REPORT   Report Status PENDING   Incomplete  MRSA PCR SCREENING     Status: None   Collection Time    04/06/12  5:15 PM      Result Value Range Status   MRSA by PCR NEGATIVE  NEGATIVE Final   Comment:            The GeneXpert MRSA Assay (FDA     approved for NASAL specimens     only), is one component of a     comprehensive MRSA colonization     surveillance program. It is not     intended to diagnose MRSA     infection nor to guide or     monitor treatment for     MRSA infections.      Labs: Basic Metabolic Panel:  Recent Labs Lab 04/06/12 1430 04/06/12 1725 04/07/12 0202 04/08/12 0646 04/10/12 0359  NA 137  --  135 138 139  K 3.5  --  2.9* 3.5 2.9*  CL 97  --  101 106 105  CO2 27  --  24 23 23   GLUCOSE 110*  --  104* 78 77  BUN 22  --  19 13 11   CREATININE 0.81  --  0.76 0.65 0.66  CALCIUM 9.4  --  8.0* 8.7 9.0  MG  --  1.9  --  2.0  --    Liver Function Tests:  Recent Labs Lab 04/06/12 1430 04/07/12 0202  AST 61* 45*  ALT 41* 39*  ALKPHOS 69 52  BILITOT 0.8 0.7  PROT 6.9 5.4*  ALBUMIN 3.7 2.7*   CBC:  Recent Labs Lab 04/06/12 1430 04/07/12 0202 04/08/12 0646 04/10/12 0359  WBC 9.7 9.7 8.5 7.2  NEUTROABS 8.2* 7.9*  --   --   HGB 12.3 9.8* 10.1* 10.2*  HCT 38.5 30.1* 30.7* 30.5*  MCV 86.5 85.8 84.6 83.3  PLT 176 131* 124* 190   Cardiac Enzymes:  Recent Labs Lab 04/06/12 1430 04/06/12 2055 04/07/12 0202  TROPONINI <0.30 <0.30 <0.30   SIGNED: Time coordinating discharge: Over 30 minutes  Debbora Presto, MD  [facility] 04/10/2012, 12:02 PM Pager [phone]  If 7PM-7AM, please contact night-coverage [email or web address] Password [email or web address]

## 2012-04-10 NOTE — Progress Notes (Signed)
ANTIBIOTIC CONSULT NOTE - FOLLOW UP  Pharmacy Consult for Vancomycin, antibiotic renal dose adjustment Indication: pneumonia  No Known Allergies  Patient Measurements: Height: 5\' 1"  (154.9 cm) Weight: 128 lb 6.4 oz (58.242 kg) IBW/kg (Calculated) : 47.8  Vital Signs: Temp: 98.6 F (37 C) (03/20 0735) Temp src: Oral (03/20 0735) BP: 177/58 mmHg (03/20 0735) Pulse Rate: 72 (03/20 0735) Intake/Output from previous day: 03/19 0701 - 03/20 0700 In: 323.3 [I.V.:323.3] Out: 700 [Urine:700]  Labs:  Recent Labs  04/08/12 0646 04/10/12 0359  WBC 8.5 7.2  HGB 10.1* 10.2*  PLT 124* 190  CREATININE 0.65 0.66   Estimated Creatinine Clearance: 40.7 ml/min (by C-G formula based on Cr of 0.66).  Microbiology: 3/16 MRSA: neg 3/16 blood x 2 >> NGTD 3/16 sputum >> 3/16 influenza PCR >> negative 3/16 legionella and strep pneumo ag >> negative  Anti-infectives: 3/16 >> Vanc x8d >> 3/16 >> Levaquin x 3d >> 3/18 3/16 >> Cefepime x 8d >> 3/16 >> Tamiflu >> 3/17  Assessment: 89 yof presented 3/16 from [facility] with SOB, fever and cough. Pt found with tachypnea, tachycardia, Tmax 104, CXR with no active dz but MD suspecting PNA (?aspiration). Code Sepsis called. Patient received a dose of Vanc and Levaquin in the ED. MD ordered for pharmacy to dose Vancomycin and adjust other antibiotics based on renal function (Cefepime, Levaquin).  Day # 5/8 Vancomycin and Cefepime, Day #3/3 Levaquin (order completed 3/18) SCr stable, CrCl ~ 42 ml/min Tm 103.1, currently afebrile.  WBC wnl. Cultures negative to date.  Goal of Therapy:  Vancomycin trough level 15-20 mcg/ml Appropriate abx dosing, eradication of infection.   Plan:   Change Cefepime to 1g IV q12 for CrCl 30-60 ml/min  Continue Vancomycin 1000mg  IV q12h.  Recheck vanc trough prn   Hessie Knows, PharmD, BCPS Pager [phone] 04/10/2012 11:14 AM

## 2012-04-10 NOTE — Progress Notes (Signed)
Pt for discharge to [facility].  CSW facilitated pt discharge needs including contacting facility, faxing pt discharge information via [facility], notifying pt son via telephone, providing RN phone number to call report, and arranging ambulance transportation for pt to [facility].  No further social work needs identified at this time.  CSW signing off.   Jacklynn Lewis, MSW, LCSWA  Clinical Social Work [phone]

## 2012-04-10 NOTE — Progress Notes (Signed)
Speech Language Pathology Dysphagia Treatment Patient Details Name: [name] MRN: [id number] DOB: 1922/05/29 Today's Date: 04/10/2012 Time: 4098-1191 SLP Time Calculation (min): 23 min  Assessment / Plan / Recommendation Clinical Impression  Pt seen to assess tolerance of diet with advancement to thin liquids after MBS completed two days prior.  Pt asleep but easily awoke with verbal stimulation- unfortunately she required verbal cues to stay awake.  Offered pt Magic Cup - orange and water, which she accepted.  Pt self feeding today but was falling asleep as she was trying to eat, requiring total cues to attend to task.  Intermittent coughing noted with po that was NOT evident when not consuming po- cough across consistencies with approx 60% of boluses - pt took only approx 9 bites/sips total before SLP ceased intake due to asp concerns.    Left labial residuals noted without pt awareness -residuals likely due to weakness with pt self feeding  as she would contact the left side of her mouth with the spoon while eating.  Use of straw is not recommended due to decreased oral control allowing pt to consume too large of a bolus and suspected aspiration of thin water via straw- overt cough.  Small orange juice cup given to pt with water that allowed better control for pt and no clinical indications of aspiration.  Pt is too lethargic to eat currently which may results in premature spillage of po into trachea - contributing to aspiration risk.  Please note, pt did not cough during entire MBS procedure.      Rec continue current diet with strict precautions, feeding pt only when she is fully alert and allowing her to self feed.  Her aspiration risk will be chronic due to her level of oral dysphagia, known esophageal dysphagia, cognitive deficits with increased pulmonary ramifications due to current medical issues.    SLP to follow to continue with mitigation of aspiration risks and to facilitate  nutrition. Pt admits currently that eating is "work"' for her and lethargy/dysphagia are contributing factors for poor intake from conversation with pt.       Diet Recommendation  Continue with Current Diet: Dysphagia 2 (fine chop);Thin liquid    SLP Plan Continue with current plan of care   Pertinent Vitals/Pain Afebrile, rhonchi (right more than left), 30% po intake   Swallowing Goals  SLP Swallowing Goals Patient will utilize recommended strategies during swallow to increase swallowing safety with: Total assistance Swallow Study Goal #3 - Progress: Not met (no family present today)  General Temperature Spikes Noted: Yes Respiratory Status: Room air Behavior/Cognition: Pleasant mood;Confused;Decreased sustained attention;Requires cueing Patient Positioning: Upright in bed  Oral Cavity - Oral Hygiene   Oral cavity was clean/clear  Dysphagia Treatment Treatment focused on: Skilled observation of diet tolerance;Patient/family/caregiver education Treatment Methods/Modalities: Skilled observation Patient observed directly with PO's: Yes Type of PO's observed: Dysphagia 1 (puree);Thin liquids Feeding: Able to feed self (pt lethargic-falling asleep self feeding-cues to stay alert) Liquids provided via: Cup;Straw Oral Phase Signs & Symptoms: Prolonged mastication;Prolonged bolus formation;Prolonged oral phase Pharyngeal Phase Signs & Symptoms: Suspected delayed swallow initiation;Delayed cough Type of cueing: Verbal;Tactile;Visual Amount of cueing: Maximal   GO     Donavan Burnet, MS [facility] SLP [phone]

## 2012-04-12 LAB — CULTURE, BLOOD (ROUTINE X 2): Culture: NO GROWTH

## 2012-04-16 ENCOUNTER — Non-Acute Institutional Stay (SKILLED_NURSING_FACILITY): Payer: Medicare Other | Admitting: Internal Medicine

## 2012-04-16 ENCOUNTER — Encounter: Payer: Self-pay | Admitting: Internal Medicine

## 2012-04-16 DIAGNOSIS — I2510 Atherosclerotic heart disease of native coronary artery without angina pectoris: Secondary | ICD-10-CM

## 2012-04-16 DIAGNOSIS — D509 Iron deficiency anemia, unspecified: Secondary | ICD-10-CM

## 2012-04-16 DIAGNOSIS — F3289 Other specified depressive episodes: Secondary | ICD-10-CM

## 2012-04-16 DIAGNOSIS — K219 Gastro-esophageal reflux disease without esophagitis: Secondary | ICD-10-CM

## 2012-04-16 DIAGNOSIS — F0390 Unspecified dementia without behavioral disturbance: Secondary | ICD-10-CM

## 2012-04-16 DIAGNOSIS — R1310 Dysphagia, unspecified: Secondary | ICD-10-CM

## 2012-04-16 DIAGNOSIS — I10 Essential (primary) hypertension: Secondary | ICD-10-CM

## 2012-04-16 DIAGNOSIS — F329 Major depressive disorder, single episode, unspecified: Secondary | ICD-10-CM

## 2012-04-16 DIAGNOSIS — F32A Depression, unspecified: Secondary | ICD-10-CM

## 2012-04-16 DIAGNOSIS — J189 Pneumonia, unspecified organism: Secondary | ICD-10-CM

## 2012-04-16 NOTE — Assessment & Plan Note (Signed)
Cont cymbalta with depakote for this.  Stable at present.

## 2012-04-16 NOTE — Assessment & Plan Note (Signed)
Continue on carafate

## 2012-04-16 NOTE — Assessment & Plan Note (Signed)
History of this.  Was ruled out for MI during her last hospitalization.  Cont asa 81, ace, beta blocker, statin.  Monitor BP carefully and adjust meds as appropriate.

## 2012-04-16 NOTE — Assessment & Plan Note (Signed)
Has one more day of levaquin tomorrow.  Improving.  Cont to monitor respiratory status.  Aspiration precautions.  Works with ST.

## 2012-04-16 NOTE — Assessment & Plan Note (Signed)
Aspiration precautions.  Dysphagia 2 diet.  Speech therapy.

## 2012-04-16 NOTE — Progress Notes (Signed)
Date: 04/16/2012  MRN:  [id number] Name:  [name] Sex:  female Age:  79 y.o. DOB:June 28, 1922                   Facility/Room:  [facility] Level Of Care:  SNF Provider:  Kermit Balo, DO, CMD  Emergency Contacts: Contact Information   Name Relation Home Work [place] Son   [phone]   Clair Gulling [phone]     Johna Sheriff [phone]      Code Status:  DNR  Allergies:No Known Allergies   Chief Complaint  Patient presents with  . Shortness of Breath    repeat admission s/p hospitalization for acute respiratory failure, sepsis, fevers from HCAP   HPI:  79 yo female with h/o end stage Alzheimer's dementia, failure to thrive, frequent falls who was previously here after another hospitalization with frequent falling.  She was falling here frequently and we discharged her to a memory care unit where we felt she would receive better nonpharmacological interventions for her dementia.  Apparently, she developed acute respiratory failure there and was sent to the hospital where she was found to be septic from HCAP. Her BP was poorly controlled and a beta blocker and norvasc were added to her regimen.  Her K was low and this was repleted and needs f/u. She was initially on cefepime and vanc and now has one more day of levaquin therapy here at this point for her pneumonia and is now on aspiration precautions with a dysphagia 2 diet.    Past Medical History  Diagnosis Date  . Diverticulosis of colon (without mention of hemorrhage) 06/24/2006  . Carcinoma of cecum 1992    History of  . Vitamin B12 deficiency   . Alzheimer's disease   . HTN (hypertension)   . Depression   . HLD (hyperlipidemia)   . Osteoporosis, unspecified   . PVD (peripheral vascular disease)   . CAD (coronary artery disease)   . GERD (gastroesophageal reflux disease)   . H. pylori infection 2004  . Presbyesophagus     Past Surgical History   Procedure Laterality Date  . Gallbladder surgery    . Abdominal hysterectomy  1995    with bso  . Right colectomy    . Cataract extraction  2001    Diet:  Dysphagia 2 with aspiration precautions Procedures: from hospital reviewed  Current Outpatient Prescriptions  Medication Sig Dispense Refill  . acetaminophen (TYLENOL) 325 MG tablet Take 650 mg by mouth 3 (three) times daily. At 9am, 3pm, and 9pm      . alendronate (FOSAMAX) 70 MG tablet Take 70 mg by mouth every 7 (seven) days. Take on Saturdays with a full glass of water on an empty stomach.      Marland Kitchen amLODipine (NORVASC) 10 MG tablet Take 1 tablet (10 mg total) by mouth daily.  30 tablet  1  . aspirin 81 MG tablet Take 81 mg by mouth daily.      Marland Kitchen atorvastatin (LIPITOR) 10 MG tablet Take 5 mg by mouth at bedtime.      . Calcium-Vitamin D 600-200 MG-UNIT per tablet Take 1 tablet by mouth every morning.       . cholecalciferol (VITAMIN D) 400 UNITS TABS Take 400 Units by mouth every morning.       . divalproex (DEPAKOTE SPRINKLE) 125 MG capsule Take 125 mg by mouth 2 (two) times daily. At 9am and 9pm      .  donepezil (ARICEPT) 10 MG tablet Take 10 mg by mouth at bedtime.      . DULoxetine (CYMBALTA) 60 MG capsule Take 60 mg by mouth every morning.       . ferrous sulfate 325 (65 FE) MG tablet Take 325 mg by mouth daily with breakfast.      . hydrochlorothiazide (MICROZIDE) 12.5 MG capsule Take 12.5 mg by mouth every morning.       Marland Kitchen levofloxacin (LEVAQUIN) 500 MG tablet Take 1 tablet (500 mg total) by mouth daily.  7 tablet  0  . lisinopril (PRINIVIL,ZESTRIL) 10 MG tablet Take 10 mg by mouth every morning.      Marland Kitchen LORazepam (ATIVAN) 0.5 MG tablet Take 0.5 tablets (0.25 mg total) by mouth 3 (three) times daily. At 9am, 3pm, and 9pm  30 tablet  1  . metoprolol (LOPRESSOR) 50 MG tablet Take 1 tablet (50 mg total) by mouth 2 (two) times daily.  60 tablet  1  . Multiple Vitamin (MULTIVITAMIN WITH MINERALS) TABS Take 1 tablet by mouth every  morning.      . sucralfate (CARAFATE) 1 GM/10ML suspension Take 1 g by mouth 4 (four) times daily -  with meals and at bedtime. At 8:30am, 11:30am, 4:30pm, and 9pm      . vitamin B-12 (CYANOCOBALAMIN) 1000 MCG tablet Take 1,000 mcg by mouth every other day.       No current facility-administered medications for this visit.     There is no immunization history on file for this patient.   Diet:  History  Substance Use Topics  . Smoking status: Former Smoker    Types: Cigarettes  . Smokeless tobacco: Never Used  . Alcohol Use: No    Family History  Problem Relation Age of Onset  . Heart disease Mother   . Colon cancer Neg Hx     ROS: Review of Systems  Constitutional: Positive for weight loss. Negative for fever and chills.  Respiratory: Negative for cough.   Cardiovascular: Negative for chest pain, palpitations and leg swelling.  Gastrointestinal: Negative for nausea, vomiting and diarrhea.  Genitourinary: Negative for dysuria.  Musculoskeletal: Positive for falls. Negative for back pain.  Skin: Negative for rash.  Neurological: Positive for weakness. Negative for dizziness, focal weakness and headaches.  Psychiatric/Behavioral: Positive for memory loss.       End stage dementia     Vital signs: There were no vitals taken for this visit. Physical Exam  Constitutional: No distress.  Frail, pleasant, Caucasian female seated in her wheelchair but trying to get up  HENT:  Head: Normocephalic and atraumatic.  Mouth/Throat: Oropharynx is clear and moist.  Eyes: EOM are normal. Pupils are equal, round, and reactive to light.  Neck: Normal range of motion.  Cardiovascular: Normal rate, regular rhythm, normal heart sounds and intact distal pulses.   Pulmonary/Chest: Effort normal and breath sounds normal.  Abdominal: Soft. Bowel sounds are normal. She exhibits no distension. There is no tenderness.  Musculoskeletal: Normal range of motion. She exhibits no tenderness.   Neurological: She is alert.  Pleasantly confused, trying to get up out of her wheelchair as I saw her  Skin: Skin is warm and dry. She is not diaphoretic.  Psychiatric: She is agitated and hyperactive. Cognition and memory are impaired. She expresses impulsivity and inappropriate judgment. She exhibits abnormal recent memory.  Only oriented to person, says she wants to go home She is inattentive.    Hospitalizations: 3/16-3/20/14 for HCAP  Problem List as  of 04/16/2012     ICD-9-CM   Depression   Dysphagia   GERD (gastroesophageal reflux disease)   Dizziness   HTN (hypertension)   Encephalopathy acute   Dementia   Sepsis   Acute respiratory distress   Fever   Dehydration   Sinus tachycardia   CAD (coronary artery disease)   HCAP (healthcare-associated pneumonia)     Infection History: completing abx for HCAP now Functional assessment:  Dependent in all adls Rehabilitation Potential: minimal Prognosis for survival:  poor Plan: HCAP (healthcare-associated pneumonia) Has one more day of levaquin tomorrow.  Improving.  Cont to monitor respiratory status.  Aspiration precautions.  Works with ST.    Dysphagia Aspiration precautions.  Dysphagia 2 diet.  Speech therapy.  GERD (gastroesophageal reflux disease) Continue on carafate  HTN (hypertension) BP had been poorly controlled at hospital and new meds were added.  Will ask staff here to notify me if readings are remaining over 150/90 three days in a row.  Must also make sure she is not becoming orthostatic b/c she falls enough as it is due to her end stage dementia.Will also ask staff to check a set of orthostatic VS and place them in my box for next week.  F/u bmp.  Dementia Is end stage.  I will talk with her family about palliative care.  She is still trying to ambulate so she may not yet be hospice eligible, but she does have frequent falls, is not very verbal and has weight loss with failure to thrive.  I will also try to  taper her off the ativan which is not helping her balance and fall risk.  Continue aricept and depakote to stabilize mood.    Depression Cont cymbalta with depakote for this.  Stable at present.    CAD (coronary artery disease) History of this.  Was ruled out for MI during her last hospitalization.  Cont asa 81, ace, beta blocker, statin.  Monitor BP carefully and adjust meds as appropriate.  Anemia, iron deficiency Is on iron supplement.  Will f/u cbc.

## 2012-04-16 NOTE — Assessment & Plan Note (Addendum)
BP had been poorly controlled at hospital and new meds were added.  Will ask staff here to notify me if readings are remaining over 150/90 three days in a row.  Must also make sure she is not becoming orthostatic b/c she falls enough as it is due to her end stage dementia.Will also ask staff to check a set of orthostatic VS and place them in my box for next week.  F/u bmp.

## 2012-04-16 NOTE — Assessment & Plan Note (Addendum)
Is end stage.  I will talk with her family about palliative care.  She is still trying to ambulate so she may not yet be hospice eligible, but she does have frequent falls, is not very verbal and has weight loss with failure to thrive.  I will also try to taper her off the ativan which is not helping her balance and fall risk.  Continue aricept and depakote to stabilize mood.

## 2012-04-16 NOTE — Assessment & Plan Note (Signed)
Is on iron supplement.  Will f/u cbc.

## 2012-06-03 ENCOUNTER — Non-Acute Institutional Stay (SKILLED_NURSING_FACILITY): Payer: Medicare Other | Admitting: Adult Health

## 2012-06-03 DIAGNOSIS — D509 Iron deficiency anemia, unspecified: Secondary | ICD-10-CM

## 2012-06-03 DIAGNOSIS — E785 Hyperlipidemia, unspecified: Secondary | ICD-10-CM

## 2012-06-03 DIAGNOSIS — F22 Delusional disorders: Secondary | ICD-10-CM

## 2012-06-03 DIAGNOSIS — F0281 Dementia in other diseases classified elsewhere with behavioral disturbance: Secondary | ICD-10-CM

## 2012-06-03 DIAGNOSIS — F329 Major depressive disorder, single episode, unspecified: Secondary | ICD-10-CM

## 2012-06-03 DIAGNOSIS — M810 Age-related osteoporosis without current pathological fracture: Secondary | ICD-10-CM

## 2012-06-03 DIAGNOSIS — K219 Gastro-esophageal reflux disease without esophagitis: Secondary | ICD-10-CM

## 2012-06-03 DIAGNOSIS — I2510 Atherosclerotic heart disease of native coronary artery without angina pectoris: Secondary | ICD-10-CM

## 2012-06-03 DIAGNOSIS — I10 Essential (primary) hypertension: Secondary | ICD-10-CM

## 2012-08-06 ENCOUNTER — Non-Acute Institutional Stay (SKILLED_NURSING_FACILITY): Payer: Medicare Other | Admitting: Internal Medicine

## 2012-08-06 DIAGNOSIS — I2510 Atherosclerotic heart disease of native coronary artery without angina pectoris: Secondary | ICD-10-CM

## 2012-08-06 DIAGNOSIS — E785 Hyperlipidemia, unspecified: Secondary | ICD-10-CM

## 2012-08-06 DIAGNOSIS — F22 Delusional disorders: Secondary | ICD-10-CM

## 2012-08-06 DIAGNOSIS — I10 Essential (primary) hypertension: Secondary | ICD-10-CM

## 2012-08-06 DIAGNOSIS — D509 Iron deficiency anemia, unspecified: Secondary | ICD-10-CM

## 2012-08-06 DIAGNOSIS — M810 Age-related osteoporosis without current pathological fracture: Secondary | ICD-10-CM

## 2012-08-06 DIAGNOSIS — F0390 Unspecified dementia without behavioral disturbance: Secondary | ICD-10-CM

## 2012-08-06 DIAGNOSIS — G301 Alzheimer's disease with late onset: Secondary | ICD-10-CM

## 2012-08-06 DIAGNOSIS — F0392 Unspecified dementia, unspecified severity, with psychotic disturbance: Secondary | ICD-10-CM

## 2012-08-06 DIAGNOSIS — F02818 Dementia in other diseases classified elsewhere, unspecified severity, with other behavioral disturbance: Secondary | ICD-10-CM

## 2012-08-06 NOTE — Progress Notes (Signed)
Patient ID: [name], female   DOB: [birth year], 79 y.o.   MRN: [id number] Location:  [facility] Provider:  Gwenith Spitz. Renato Gails, D.O., C.M.D.  Code Status:  DNR  Chief Complaint: med mgt chronic diseases  HPI:  79 yo female with h/o Alzheimer's disease, frequent falls, senile osteoporosis, hyperlipidemia, CAD, depression and generalized weakness was seen for medical mgt of chronic diseases.  When seen, she had no complaints.  She's been noted to be anemic, and due to frequent falls, this will be worked up.  Review of Systems:  Review of Systems  Constitutional: Negative for fever and malaise/fatigue.  Respiratory: Negative for shortness of breath.   Cardiovascular: Negative for chest pain.  Gastrointestinal: Negative for abdominal pain.  Musculoskeletal: Positive for falls.  Neurological: Positive for dizziness. Negative for loss of consciousness and headaches.  Endo/Heme/Allergies: Bruises/bleeds easily.  Psychiatric/Behavioral: Positive for depression and memory loss.     Medications: Patient's Medications  New Prescriptions   No medications on file  Previous Medications   ACETAMINOPHEN (TYLENOL) 650 MG CR TABLET    Take 650 mg by mouth 3 (three) times daily.   ALENDRONATE (FOSAMAX) 70 MG TABLET    Take 70 mg by mouth every 7 (seven) days. Takes on Mondays. Take with a full glass of water on an empty stomach.   AMLODIPINE (NORVASC) 10 MG TABLET    Take 10 mg by mouth daily.   ASPIRIN EC 81 MG TABLET    Take 81 mg by mouth daily.   ATORVASTATIN (LIPITOR) 10 MG TABLET    Take 5 mg by mouth daily. Take half a tab to equal 5mg    CALCIUM CARBONATE-VITAMIN D (CALCIUM-VITAMIN D) 500-200 MG-UNIT PER TABLET    Take 1 tablet by mouth daily.   CHOLECALCIFEROL (VITAMIN D) 400 UNITS TABS TABLET    Take 400 Units by mouth daily.   DIVALPROEX (DEPAKOTE SPRINKLE) 125 MG CAPSULE    Take 250 mg by mouth 2 (two) times daily.   DONEPEZIL (ARICEPT) 10 MG TABLET    Take 10 mg by  mouth at bedtime as needed (for memory).   DULOXETINE (CYMBALTA) 60 MG CAPSULE    Take 60 mg by mouth daily.   FERROUS SULFATE 325 (65 FE) MG TABLET    Take 325 mg by mouth daily with breakfast.   HALOPERIDOL (HALDOL) 2 MG TABLET    Take 2 mg by mouth every 6 (six) hours as needed (as needed for agitation).   LISINOPRIL (PRINIVIL,ZESTRIL) 10 MG TABLET    Take 10 mg by mouth daily.   METOPROLOL (LOPRESSOR) 50 MG TABLET    Take 50 mg by mouth 2 (two) times daily.   MULTIPLE VITAMIN (MULTIVITAMIN WITH MINERALS) TABS TABLET    Take 1 tablet by mouth daily.   RISPERIDONE (RISPERDAL) 0.25 MG TABLET    Take 0.25 mg by mouth at bedtime.   SENNOSIDES-DOCUSATE SODIUM (SENOKOT-S) 8.6-50 MG TABLET    Take 1 tablet by mouth daily.   SUCRALFATE (CARAFATE) 1 GM/10ML SUSPENSION    Take 1 g by mouth 4 (four) times daily -  before meals and at bedtime.   VITAMIN B-12 (CYANOCOBALAMIN) 1000 MCG TABLET    Take 1,000 mcg by mouth every other day.  Modified Medications   No medications on file  Discontinued Medications   ACETAMINOPHEN (TYLENOL) 325 MG TABLET    Take 650 mg by mouth 3 (three) times daily. At 9am, 3pm, and 9pm   ALENDRONATE (FOSAMAX) 70 MG TABLET  Take 70 mg by mouth every 7 (seven) days. Take 1 tablet once weekly  on Monday with a full glass of water on an empty stomach to treat osteoporosis.   AMLODIPINE (NORVASC) 10 MG TABLET    Take 1 tablet (10 mg total) by mouth daily.   ASPIRIN 81 MG TABLET    Take 81 mg by mouth daily. Take 1 tablet daily for heart health and to prevent stroke.   ATORVASTATIN (LIPITOR) 10 MG TABLET    Take 5 mg by mouth at bedtime. Give 5 mg by mouth at bedtime for coronary atherosclerosis.   CALCIUM-VITAMIN D 600-200 MG-UNIT PER TABLET    Take 1 tablet by mouth every morning.    CHOLECALCIFEROL (VITAMIN D) 400 UNITS TABS    Take 400 Units by mouth every morning. Give 1 400 units  tablet for osteoporosis.   DIVALPROEX (DEPAKOTE SPRINKLE) 125 MG CAPSULE    Take 125 mg by mouth  2 (two) times daily. Give 125mg  capsule by mouth twice daily for dementia at 9am and 9pm. Increase sprinkle to 250 mg twice for behaviors.   DONEPEZIL (ARICEPT) 10 MG TABLET    Take 10 mg by mouth at bedtime.   DULOXETINE (CYMBALTA) 60 MG CAPSULE    Take 60 mg by mouth every morning. Give 1 tablet daily for depressive disorder/ to help with mood.   FERROUS SULFATE 325 (65 FE) MG TABLET    Take 325 mg by mouth daily with breakfast.   HYDROCHLOROTHIAZIDE (MICROZIDE) 12.5 MG CAPSULE    Take 12.5 mg by mouth every morning. Take 1 tablet daily for hypertension.   LEVOFLOXACIN (LEVAQUIN) 500 MG TABLET    Take 1 tablet (500 mg total) by mouth daily.   LISINOPRIL (PRINIVIL,ZESTRIL) 10 MG TABLET    Take 10 mg by mouth every morning. Take 1 tablet daily for hypertension.   LORAZEPAM (ATIVAN) 0.5 MG TABLET    Take 0.5 tablets (0.25 mg total) by mouth 3 (three) times daily. At 9am, 3pm, and 9pm   METOPROLOL (LOPRESSOR) 50 MG TABLET    Take 1 tablet (50 mg total) by mouth 2 (two) times daily.   MULTIPLE VITAMIN (MULTIVITAMIN WITH MINERALS) TABS    Take 1 tablet by mouth every morning.   SUCRALFATE (CARAFATE) 1 GM/10ML SUSPENSION    Take 1 g by mouth 4 (four) times daily -  with meals and at bedtime. At 8:30am, 11:30am, 4:30pm, and 9pm   VITAMIN B-12 (CYANOCOBALAMIN) 1000 MCG TABLET    Take 1,000 mcg by mouth every other day.    Physical Exam: Filed Vitals:   08/06/12 1443  BP: 128/49  Pulse: 54  Temp: 96.9 F (36.1 C)  Resp: 20   Physical Exam  Constitutional:  Frail white female, short stature, gets around in wheelchair, but continually tries to get up and falls frequently  Cardiovascular: Normal rate, regular rhythm, normal heart sounds and intact distal pulses.   Pulmonary/Chest: Effort normal and breath sounds normal. No respiratory distress.  Abdominal: Soft. Bowel sounds are normal. She exhibits no distension and no mass. There is no tenderness.  Musculoskeletal: Normal range of motion.   Neurological: She is alert.  Pleasantly confused, has periods of lucidity  Skin: Skin is warm and dry. There is pallor.  Psychiatric: She has a normal mood and affect.     Labs reviewed: Basic Metabolic Panel:  Recent Labs  16/10/96 1725  04/08/12 0646  09/10/12 0215 09/17/12 1450 10/20/12 0335  NA  --   < >  138  < > 141 142 142  K  --   < > 3.5  < > 3.8 4.0 3.8  CL  --   < > 106  < > 105 108 104  CO2  --   < > 23  < > 25 27 29   GLUCOSE  --   < > 78  < > 78 85 79  BUN  --   < > 13  < > 25* 27* 21  CREATININE  --   < > 0.65  < > 0.75 0.81 0.65  CALCIUM  --   < > 8.7  < > 9.3 8.7 9.4  MG 1.9  --  2.0  --   --   --   --   < > = values in this interval not displayed.  Liver Function Tests:  Recent Labs  04/06/12 1430 04/07/12 0202 09/17/12 1601  AST 61* 45* 27  ALT 41* 39* 13  ALKPHOS 69 52 56  BILITOT 0.8 0.7 0.4  PROT 6.9 5.4* 6.5  ALBUMIN 3.7 2.7* 3.0*    CBC:  Recent Labs  04/07/12 0202  09/09/12 0954  09/10/12 0215 09/17/12 1450 10/20/12 0335  WBC 9.7  < > 8.0  < > 7.5 5.7 8.6  NEUTROABS 7.9*  --  4.8  --   --   --  4.7  HGB 9.8*  < > 11.3*  < > 10.8* 10.0* 11.2*  HCT 30.1*  < > 34.3*  < > 31.8* 30.5* 33.7*  MCV 85.8  < > 89.1  < > 88.6 90.2 88.9  PLT 131*  < > 196  < > 197 164 189  < > = values in this interval not displayed.  Assessment/Plan 1. Anemia, iron deficiency -check B12, iron panel, ferritin  2. Dementia of the Alzheimer's type, with late onset, with delusions -continue aricept, still able to feed herself and converse though confused -has concomitant depression on cymbalta, haldol, risperdal due to psychosis  3. CAD (coronary artery disease) -cont lipid and bp control for secondary prevention  4. HTN (hypertension) -bp satisfactory with current therapy, no changes needed, cont to monitor  5. Senile osteoporosis =-cont ca with D and additional vit D supplement -recheck vit D level  6. Dyslipidemia -cont lipitor due to  CAD  Labs/tests ordered:  Vit D, b12, iron panel, ferritin

## 2012-08-13 ENCOUNTER — Non-Acute Institutional Stay (SKILLED_NURSING_FACILITY): Payer: Medicare Other | Admitting: Internal Medicine

## 2012-08-13 DIAGNOSIS — L02511 Cutaneous abscess of right hand: Secondary | ICD-10-CM

## 2012-08-13 DIAGNOSIS — G309 Alzheimer's disease, unspecified: Secondary | ICD-10-CM

## 2012-08-13 DIAGNOSIS — L02519 Cutaneous abscess of unspecified hand: Secondary | ICD-10-CM

## 2012-08-13 DIAGNOSIS — L03019 Cellulitis of unspecified finger: Secondary | ICD-10-CM

## 2012-08-13 DIAGNOSIS — F0280 Dementia in other diseases classified elsewhere without behavioral disturbance: Secondary | ICD-10-CM

## 2012-08-13 MED ORDER — DOXYCYCLINE HYCLATE 100 MG PO TABS: 100.0000 mg | ORAL_TABLET | Freq: Two times a day (BID) | ORAL | Status: DC

## 2012-08-13 NOTE — Progress Notes (Signed)
Patient ID: [name], female   DOB: 11/10/1922, 79 y.o.   MRN: [id number] Location:  Location:  [facility] Provider:  Gwenith Spitz. Renato Gails, D.O., C.M.D.  Code Status:  DNR  Chief Complaint: AV finger infection HPI:  79 yo female with Alzheimer's disease, senile osteoporosis, depression, recurrent falls here for long term care was seen due to swelling, redness, drainage and pain in her right 2nd digit.  Staff believe she may have gotten her finger caught in her wheelchair while rolling down the hall.    Review of Systems:  Review of Systems  Constitutional: Positive for malaise/fatigue. Negative for fever.  Eyes:       Wears glasses  Respiratory: Negative for shortness of breath.   Cardiovascular: Negative for chest pain.  Gastrointestinal: Negative for constipation.  Genitourinary: Negative for dysuria.       Incontinence  Musculoskeletal: Positive for falls.  Skin:       Right index finger redness, warmth, drainage, pain  Neurological: Positive for dizziness and weakness. Negative for headaches.  Endo/Heme/Allergies: Bruises/bleeds easily.  Psychiatric/Behavioral: Positive for depression and memory loss.     Medications: Patient's Medications  New Prescriptions   No medications on file  Previous Medications   ACETAMINOPHEN (TYLENOL) 325 MG TABLET    Take 650 mg by mouth 3 (three) times daily. At 9am, 3pm, and 9pm   ALENDRONATE (FOSAMAX) 70 MG TABLET    Take 70 mg by mouth every 7 (seven) days. Take on Saturdays with a full glass of water on an empty stomach.   AMLODIPINE (NORVASC) 10 MG TABLET    Take 1 tablet (10 mg total) by mouth daily.   ASPIRIN 81 MG TABLET    Take 81 mg by mouth daily.   ATORVASTATIN (LIPITOR) 10 MG TABLET    Take 5 mg by mouth at bedtime.   CALCIUM-VITAMIN D 600-200 MG-UNIT PER TABLET    Take 1 tablet by mouth every morning.    CHOLECALCIFEROL (VITAMIN D) 400 UNITS TABS    Take 400 Units by mouth every morning.    DIVALPROEX (DEPAKOTE  SPRINKLE) 125 MG CAPSULE    Take 125 mg by mouth 2 (two) times daily. At 9am and 9pm   DONEPEZIL (ARICEPT) 10 MG TABLET    Take 10 mg by mouth at bedtime.   DULOXETINE (CYMBALTA) 60 MG CAPSULE    Take 60 mg by mouth every morning.    FERROUS SULFATE 325 (65 FE) MG TABLET    Take 325 mg by mouth daily with breakfast.   HYDROCHLOROTHIAZIDE (MICROZIDE) 12.5 MG CAPSULE    Take 12.5 mg by mouth every morning.    LEVOFLOXACIN (LEVAQUIN) 500 MG TABLET    Take 1 tablet (500 mg total) by mouth daily.   LISINOPRIL (PRINIVIL,ZESTRIL) 10 MG TABLET    Take 10 mg by mouth every morning.   LORAZEPAM (ATIVAN) 0.5 MG TABLET    Take 0.5 tablets (0.25 mg total) by mouth 3 (three) times daily. At 9am, 3pm, and 9pm   METOPROLOL (LOPRESSOR) 50 MG TABLET    Take 1 tablet (50 mg total) by mouth 2 (two) times daily.   MULTIPLE VITAMIN (MULTIVITAMIN WITH MINERALS) TABS    Take 1 tablet by mouth every morning.   SUCRALFATE (CARAFATE) 1 GM/10ML SUSPENSION    Take 1 g by mouth 4 (four) times daily -  with meals and at bedtime. At 8:30am, 11:30am, 4:30pm, and 9pm   VITAMIN B-12 (CYANOCOBALAMIN) 1000 MCG TABLET  Take 1,000 mcg by mouth every other day.  Modified Medications   No medications on file  Discontinued Medications   No medications on file    Physical Exam: There were no vitals filed for this visit. Physical Exam  Cardiovascular: Normal rate, regular rhythm, normal heart sounds and intact distal pulses.   Pulmonary/Chest: Effort normal and breath sounds normal. No respiratory distress.  Abdominal: Soft. Bowel sounds are normal. She exhibits no distension and no mass. There is no tenderness.  Musculoskeletal: Normal range of motion.  Neurological: She is alert.  Pleasantly confused, rolling around in wheelchair in the hall  Skin:  erythema, warmth, swelling, drainage from right index finger     Labs reviewed: Basic Metabolic Panel:  Recent Labs  16/10/96 1545  04/06/12 1725 04/07/12 0202  04/08/12 0646 04/10/12 0359  NA 139  < >  --  135 138 139  K 3.1*  < >  --  2.9* 3.5 2.9*  CL 99  < >  --  101 106 105  CO2 28  < >  --  24 23 23   GLUCOSE 84  < >  --  104* 78 77  BUN 22  < >  --  19 13 11   CREATININE 0.79  < >  --  0.76 0.65 0.66  CALCIUM 10.0  < >  --  8.0* 8.7 9.0  MG 2.3  --  1.9  --  2.0  --   PHOS 3.7  --   --   --   --   --   < > = values in this interval not displayed.  Liver Function Tests:  Recent Labs  01/07/12 0407 04/06/12 1430 04/07/12 0202  AST 33 61* 45*  ALT 20 41* 39*  ALKPHOS 55 69 52  BILITOT 0.7 0.8 0.7  PROT 6.2 6.9 5.4*  ALBUMIN 3.4* 3.7 2.7*    CBC:  Recent Labs  12/24/11 1545  04/06/12 1430 04/07/12 0202 04/08/12 0646 04/10/12 0359  WBC 6.5  < > 9.7 9.7 8.5 7.2  NEUTROABS 4.9  --  8.2* 7.9*  --   --   HGB 12.1  < > 12.3 9.8* 10.1* 10.2*  HCT 36.1  < > 38.5 30.1* 30.7* 30.5*  MCV 86.4  < > 86.5 85.8 84.6 83.3  PLT 175  < > 176 131* 124* 190  < > = values in this interval not displayed.  Assessment/Plan 1. Abscess of finger, right -keep finger clean and dry -apply antibacterial ointment twice a day - doxycycline (VIBRA-TABS) 100 MG tablet; Take 1 tablet (100 mg total) by mouth 2 (two) times daily.  Dispense: 20 tablet; Refill: 0 -florastor for 14 days -cbc with diff  2. Alzheimer's disease -pt does not feel well, weak and fatigued so will check cbc as she looks pale  - DNR (Do Not Resuscitate)  Labs/tests ordered:  Cbc with diff

## 2012-08-22 ENCOUNTER — Encounter: Payer: Self-pay | Admitting: *Deleted

## 2012-09-04 ENCOUNTER — Other Ambulatory Visit: Payer: Self-pay | Admitting: Geriatric Medicine

## 2012-09-04 MED ORDER — SUCRALFATE 1 GM/10ML PO SUSP: ORAL | Status: DC

## 2012-09-09 ENCOUNTER — Emergency Department (HOSPITAL_COMMUNITY): Payer: Medicare Other

## 2012-09-09 ENCOUNTER — Inpatient Hospital Stay (HOSPITAL_COMMUNITY)
Admission: EM | Admit: 2012-09-09 | Discharge: 2012-09-12 | DRG: 948 | Disposition: A | Discharge status: Skilled Nursing Facility | Payer: Medicare Other | Attending: Internal Medicine | Admitting: Internal Medicine

## 2012-09-09 ENCOUNTER — Encounter (HOSPITAL_COMMUNITY): Payer: Self-pay | Admitting: Emergency Medicine

## 2012-09-09 DIAGNOSIS — M542 Cervicalgia: Secondary | ICD-10-CM | POA: Diagnosis present

## 2012-09-09 DIAGNOSIS — A419 Sepsis, unspecified organism: Secondary | ICD-10-CM

## 2012-09-09 DIAGNOSIS — R296 Repeated falls: Secondary | ICD-10-CM

## 2012-09-09 DIAGNOSIS — Z9089 Acquired absence of other organs: Secondary | ICD-10-CM

## 2012-09-09 DIAGNOSIS — H269 Unspecified cataract: Secondary | ICD-10-CM | POA: Diagnosis present

## 2012-09-09 DIAGNOSIS — E785 Hyperlipidemia, unspecified: Secondary | ICD-10-CM | POA: Diagnosis present

## 2012-09-09 DIAGNOSIS — Z85038 Personal history of other malignant neoplasm of large intestine: Secondary | ICD-10-CM

## 2012-09-09 DIAGNOSIS — K2289 Other specified disease of esophagus: Secondary | ICD-10-CM | POA: Diagnosis present

## 2012-09-09 DIAGNOSIS — Z9849 Cataract extraction status, unspecified eye: Secondary | ICD-10-CM

## 2012-09-09 DIAGNOSIS — E860 Dehydration: Secondary | ICD-10-CM

## 2012-09-09 DIAGNOSIS — F0390 Unspecified dementia without behavioral disturbance: Secondary | ICD-10-CM

## 2012-09-09 DIAGNOSIS — J189 Pneumonia, unspecified organism: Secondary | ICD-10-CM

## 2012-09-09 DIAGNOSIS — Z9049 Acquired absence of other specified parts of digestive tract: Secondary | ICD-10-CM

## 2012-09-09 DIAGNOSIS — K228 Other specified diseases of esophagus: Secondary | ICD-10-CM | POA: Diagnosis present

## 2012-09-09 DIAGNOSIS — D509 Iron deficiency anemia, unspecified: Secondary | ICD-10-CM | POA: Diagnosis present

## 2012-09-09 DIAGNOSIS — R42 Dizziness and giddiness: Secondary | ICD-10-CM

## 2012-09-09 DIAGNOSIS — R5381 Other malaise: Principal | ICD-10-CM | POA: Diagnosis present

## 2012-09-09 DIAGNOSIS — R531 Weakness: Secondary | ICD-10-CM | POA: Diagnosis present

## 2012-09-09 DIAGNOSIS — R1310 Dysphagia, unspecified: Secondary | ICD-10-CM

## 2012-09-09 DIAGNOSIS — I10 Essential (primary) hypertension: Secondary | ICD-10-CM | POA: Diagnosis present

## 2012-09-09 DIAGNOSIS — D649 Anemia, unspecified: Secondary | ICD-10-CM | POA: Diagnosis present

## 2012-09-09 DIAGNOSIS — I2510 Atherosclerotic heart disease of native coronary artery without angina pectoris: Secondary | ICD-10-CM

## 2012-09-09 DIAGNOSIS — K219 Gastro-esophageal reflux disease without esophagitis: Secondary | ICD-10-CM | POA: Diagnosis present

## 2012-09-09 DIAGNOSIS — W010XXA Fall on same level from slipping, tripping and stumbling without subsequent striking against object, initial encounter: Secondary | ICD-10-CM | POA: Diagnosis present

## 2012-09-09 DIAGNOSIS — R509 Fever, unspecified: Secondary | ICD-10-CM

## 2012-09-09 DIAGNOSIS — G309 Alzheimer's disease, unspecified: Secondary | ICD-10-CM | POA: Diagnosis present

## 2012-09-09 DIAGNOSIS — Y9210 Unspecified residential institution as the place of occurrence of the external cause: Secondary | ICD-10-CM | POA: Diagnosis present

## 2012-09-09 DIAGNOSIS — K5730 Diverticulosis of large intestine without perforation or abscess without bleeding: Secondary | ICD-10-CM | POA: Diagnosis present

## 2012-09-09 DIAGNOSIS — S0010XA Contusion of unspecified eyelid and periocular area, initial encounter: Secondary | ICD-10-CM | POA: Diagnosis present

## 2012-09-09 DIAGNOSIS — Z79899 Other long term (current) drug therapy: Secondary | ICD-10-CM

## 2012-09-09 DIAGNOSIS — R0603 Acute respiratory distress: Secondary | ICD-10-CM

## 2012-09-09 DIAGNOSIS — Y998 Other external cause status: Secondary | ICD-10-CM

## 2012-09-09 DIAGNOSIS — R000 Tachycardia, unspecified: Secondary | ICD-10-CM

## 2012-09-09 DIAGNOSIS — F329 Major depressive disorder, single episode, unspecified: Secondary | ICD-10-CM | POA: Diagnosis present

## 2012-09-09 DIAGNOSIS — F0280 Dementia in other diseases classified elsewhere without behavioral disturbance: Secondary | ICD-10-CM | POA: Diagnosis present

## 2012-09-09 DIAGNOSIS — G9340 Encephalopathy, unspecified: Secondary | ICD-10-CM

## 2012-09-09 DIAGNOSIS — M810 Age-related osteoporosis without current pathological fracture: Secondary | ICD-10-CM | POA: Diagnosis present

## 2012-09-09 DIAGNOSIS — I739 Peripheral vascular disease, unspecified: Secondary | ICD-10-CM | POA: Diagnosis present

## 2012-09-09 DIAGNOSIS — E538 Deficiency of other specified B group vitamins: Secondary | ICD-10-CM | POA: Diagnosis present

## 2012-09-09 DIAGNOSIS — Z9181 History of falling: Secondary | ICD-10-CM

## 2012-09-09 DIAGNOSIS — F3289 Other specified depressive episodes: Secondary | ICD-10-CM | POA: Diagnosis present

## 2012-09-09 LAB — BASIC METABOLIC PANEL
BUN: 20 mg/dL (ref 6–23)
CO2: 30 mEq/L (ref 19–32)
Chloride: 103 mEq/L (ref 96–112)
Creatinine, Ser: 0.73 mg/dL (ref 0.50–1.10)
Potassium: 3.7 mEq/L (ref 3.5–5.1)

## 2012-09-09 LAB — CBC WITH DIFFERENTIAL/PLATELET
HCT: 34.3 % — ABNORMAL LOW (ref 36.0–46.0)
Hemoglobin: 11.3 g/dL — ABNORMAL LOW (ref 12.0–15.0)
Lymphocytes Relative: 29 % (ref 12–46)
Monocytes Absolute: 0.7 10*3/uL (ref 0.1–1.0)
Monocytes Relative: 9 % (ref 3–12)
Neutro Abs: 4.8 10*3/uL (ref 1.7–7.7)
Neutrophils Relative %: 60 % (ref 43–77)
RBC: 3.85 MIL/uL — ABNORMAL LOW (ref 3.87–5.11)
WBC: 8 10*3/uL (ref 4.0–10.5)

## 2012-09-09 LAB — URINE MICROSCOPIC-ADD ON

## 2012-09-09 LAB — CBC
MCH: 29.9 pg (ref 26.0–34.0)
MCHC: 33.7 g/dL (ref 30.0–36.0)
Platelets: 199 10*3/uL (ref 150–400)

## 2012-09-09 LAB — CREATININE, SERUM: Creatinine, Ser: 0.66 mg/dL (ref 0.50–1.10)

## 2012-09-09 LAB — URINALYSIS, ROUTINE W REFLEX MICROSCOPIC
Bilirubin Urine: NEGATIVE
Hgb urine dipstick: NEGATIVE
Nitrite: NEGATIVE
Specific Gravity, Urine: 1.015 (ref 1.005–1.030)
pH: 7.5 (ref 5.0–8.0)

## 2012-09-09 LAB — MRSA PCR SCREENING: MRSA by PCR: NEGATIVE

## 2012-09-09 LAB — TROPONIN I: Troponin I: <0.3 ng/mL (ref <0.30)

## 2012-09-09 MED ORDER — MORPHINE SULFATE 2 MG/ML IJ SOLN: 2.0000 mg | INTRAMUSCULAR | Status: DC | PRN

## 2012-09-09 MED ORDER — METOPROLOL TARTRATE 50 MG PO TABS
50.0000 mg | ORAL_TABLET | Freq: Two times a day (BID) | ORAL | Status: DC
  Administered 2012-09-09 – 2012-09-11 (×4): 50 mg via ORAL
  Filled 2012-09-09 (×8): qty 1

## 2012-09-09 MED ORDER — ADULT MULTIVITAMIN W/MINERALS CH
1.0000 | ORAL_TABLET | Freq: Every day | ORAL | Status: DC
  Administered 2012-09-10 – 2012-09-12 (×3): 1 via ORAL
  Filled 2012-09-09 (×3): qty 1

## 2012-09-09 MED ORDER — SODIUM CHLORIDE 0.9 % IV SOLN: INTRAVENOUS | Status: DC

## 2012-09-09 MED ORDER — OXYCODONE HCL 5 MG PO TABS: 5.0000 mg | ORAL_TABLET | Freq: Four times a day (QID) | ORAL | Status: DC | PRN

## 2012-09-09 MED ORDER — VITAMIN B-12 1000 MCG PO TABS
1000.0000 ug | ORAL_TABLET | ORAL | Status: DC
  Administered 2012-09-10 – 2012-09-12 (×2): 1000 ug via ORAL
  Filled 2012-09-09 (×2): qty 1

## 2012-09-09 MED ORDER — ATORVASTATIN CALCIUM 10 MG PO TABS
5.0000 mg | ORAL_TABLET | Freq: Every day | ORAL | Status: DC
  Administered 2012-09-10 – 2012-09-12 (×3): 5 mg via ORAL
  Filled 2012-09-09 (×3): qty 0.5

## 2012-09-09 MED ORDER — DIVALPROEX SODIUM 125 MG PO CPSP
250.0000 mg | ORAL_CAPSULE | Freq: Two times a day (BID) | ORAL | Status: DC
  Administered 2012-09-09 – 2012-09-12 (×6): 250 mg via ORAL
  Filled 2012-09-09 (×7): qty 2

## 2012-09-09 MED ORDER — ASPIRIN EC 81 MG PO TBEC
81.0000 mg | DELAYED_RELEASE_TABLET | Freq: Every day | ORAL | Status: DC
  Administered 2012-09-09 – 2012-09-12 (×4): 81 mg via ORAL
  Filled 2012-09-09 (×4): qty 1

## 2012-09-09 MED ORDER — HEPARIN SODIUM (PORCINE) 5000 UNIT/ML IJ SOLN
5000.0000 [IU] | Freq: Three times a day (TID) | INTRAMUSCULAR | Status: DC
  Administered 2012-09-09 – 2012-09-12 (×8): 5000 [IU] via SUBCUTANEOUS
  Filled 2012-09-09 (×12): qty 1

## 2012-09-09 MED ORDER — SUCRALFATE 1 GM/10ML PO SUSP
1.0000 g | Freq: Three times a day (TID) | ORAL | Status: DC
  Administered 2012-09-09 – 2012-09-12 (×10): 1 g via ORAL
  Filled 2012-09-09 (×17): qty 10

## 2012-09-09 MED ORDER — AMLODIPINE BESYLATE 10 MG PO TABS
10.0000 mg | ORAL_TABLET | Freq: Every day | ORAL | Status: DC
  Administered 2012-09-10 – 2012-09-12 (×3): 10 mg via ORAL
  Filled 2012-09-09 (×3): qty 1

## 2012-09-09 MED ORDER — ACETAMINOPHEN ER 650 MG PO TBCR
650.0000 mg | EXTENDED_RELEASE_TABLET | Freq: Three times a day (TID) | ORAL | Status: DC
Start: 2012-09-09 — End: 2012-09-09

## 2012-09-09 MED ORDER — DULOXETINE HCL 60 MG PO CPEP
60.0000 mg | ORAL_CAPSULE | Freq: Every day | ORAL | Status: DC
  Administered 2012-09-10 – 2012-09-12 (×3): 60 mg via ORAL
  Filled 2012-09-09 (×3): qty 1

## 2012-09-09 MED ORDER — CHOLECALCIFEROL 10 MCG (400 UNIT) PO TABS
400.0000 [IU] | ORAL_TABLET | Freq: Every day | ORAL | Status: DC
  Administered 2012-09-10 – 2012-09-12 (×3): 400 [IU] via ORAL
  Filled 2012-09-09 (×3): qty 1

## 2012-09-09 MED ORDER — CALCIUM-VITAMIN D 500-200 MG-UNIT PO TABS: 1.0000 | ORAL_TABLET | Freq: Every day | ORAL | Status: DC

## 2012-09-09 MED ORDER — DONEPEZIL HCL 10 MG PO TABS
10.0000 mg | ORAL_TABLET | Freq: Every day | ORAL | Status: DC
  Administered 2012-09-09 – 2012-09-11 (×3): 10 mg via ORAL
  Filled 2012-09-09 (×4): qty 1

## 2012-09-09 MED ORDER — FERROUS SULFATE 325 (65 FE) MG PO TABS
325.0000 mg | ORAL_TABLET | Freq: Every day | ORAL | Status: DC
  Administered 2012-09-10 – 2012-09-12 (×3): 325 mg via ORAL
  Filled 2012-09-09 (×4): qty 1

## 2012-09-09 MED ORDER — LORAZEPAM 0.5 MG PO TABS
0.2500 mg | ORAL_TABLET | Freq: Two times a day (BID) | ORAL | Status: DC | PRN
  Administered 2012-09-10: 0.25 mg via ORAL
  Filled 2012-09-09: qty 1

## 2012-09-09 MED ORDER — ACETAMINOPHEN 325 MG PO TABS
650.0000 mg | ORAL_TABLET | Freq: Three times a day (TID) | ORAL | Status: DC
  Administered 2012-09-09 – 2012-09-12 (×9): 650 mg via ORAL
  Filled 2012-09-09 (×9): qty 2

## 2012-09-09 MED ORDER — LISINOPRIL 10 MG PO TABS
10.0000 mg | ORAL_TABLET | Freq: Every day | ORAL | Status: DC
  Administered 2012-09-09 – 2012-09-12 (×4): 10 mg via ORAL
  Filled 2012-09-09 (×4): qty 1

## 2012-09-09 MED ORDER — SENNOSIDES-DOCUSATE SODIUM 8.6-50 MG PO TABS
1.0000 | ORAL_TABLET | Freq: Every evening | ORAL | Status: DC | PRN
  Filled 2012-09-09: qty 1

## 2012-09-09 MED ORDER — CALCIUM CARBONATE-VITAMIN D 500-200 MG-UNIT PO TABS
1.0000 | ORAL_TABLET | Freq: Every day | ORAL | Status: DC
  Administered 2012-09-10 – 2012-09-12 (×3): 1 via ORAL
  Filled 2012-09-09 (×4): qty 1

## 2012-09-09 NOTE — ED Notes (Signed)
Attempted to ambulate pt with 2 staff assist. Pt unable to stand on her own and hold her weight. Pt states she is weak.

## 2012-09-09 NOTE — H&P (Signed)
PCP:   [name], MD   Chief Complaint:  Falls, generalized weakness.   HPI: This is an 79 year old female resident of [facility], with history of Alzheimer's dementia, depression, CAD, PVD, HTN, Vit B12 deficiency, iron-deficiency anemia, dyslipidemia, osteoporosis, GERD, diverticulosis, colon cancer s/p right hemicolectomy, cataracts, s/p cataract extraction, s/p cholecystectomy, s/p TAH/BSO, brought to the ED, due to recurrent falls at the facility. As a matter of fact, fell twice on 09/06/12. Patient is demented and therefore unable to provide a history, so this was gleaned from ED MD . Patient evaluated with multiple imaging studies in the ED, including Head/Maxillo-facial/Cervical spine CT scans, X-Rays of thoracic spine and pelvis, as well as CXR, all of which revealed no acute findings. Per ED MD, patient is weak and unable to stand without assistance.    Allergies:   Allergies  Allergen Reactions  . Captopril-Hydrochlorothiazide   . Niacin And Related   . Urispas [Flavoxate]   . Verapamil       Past Medical History  Diagnosis Date  . Diverticulosis of colon (without mention of hemorrhage) 06/24/2006  . Carcinoma of cecum 1992    History of  . Vitamin B12 deficiency   . Alzheimer's disease   . HTN (hypertension)   . Depression   . HLD (hyperlipidemia)   . Osteoporosis, unspecified   . PVD (peripheral vascular disease)   . CAD (coronary artery disease)   . GERD (gastroesophageal reflux disease)   . H. pylori infection 2004  . Presbyesophagus   . Iron deficiency anemia, unspecified 01/02/2012  . Personal history of fall 01/04/2009  . Unspecified constipation   . Hemorrhage of rectum and anus   . Chest pain, unspecified   . Acute pharyngitis   . Malignant neoplasm of colon, unspecified site   . Edema   . Cataract     Past Surgical History  Procedure Laterality Date  . Gallbladder surgery    . Abdominal hysterectomy  1995    with bso  . Right colectomy     . Cataract extraction  2001  . Cholecystectomy Right     Prior to Admission medications   Medication Sig Start Date End Date Taking? Authorizing Provider  acetaminophen (TYLENOL) 650 MG CR tablet Take 650 mg by mouth 3 (three) times daily.   Yes Historical Provider, MD  alendronate (FOSAMAX) 70 MG tablet Take 70 mg by mouth every 7 (seven) days. Takes on Mondays. Take with a full glass of water on an empty stomach.   Yes Historical Provider, MD  amLODipine (NORVASC) 10 MG tablet Take 10 mg by mouth daily.   Yes Historical Provider, MD  aspirin EC 81 MG tablet Take 81 mg by mouth daily.   Yes Historical Provider, MD  atorvastatin (LIPITOR) 10 MG tablet Take 5 mg by mouth daily.   Yes Historical Provider, MD  Calcium Carbonate-Vitamin D (CALCIUM-VITAMIN D) 500-200 MG-UNIT per tablet Take 1 tablet by mouth daily.   Yes Historical Provider, MD  cholecalciferol (VITAMIN D) 400 UNITS TABS tablet Take 400 Units by mouth daily.   Yes Historical Provider, MD  divalproex (DEPAKOTE SPRINKLE) 125 MG capsule Take 250 mg by mouth 2 (two) times daily.   Yes Historical Provider, MD  donepezil (ARICEPT) 10 MG tablet Take 10 mg by mouth at bedtime as needed.   Yes Historical Provider, MD  DULoxetine (CYMBALTA) 60 MG capsule Take 60 mg by mouth daily.   Yes Historical Provider, MD  ferrous sulfate 325 (65  FE) MG tablet Take 325 mg by mouth daily with breakfast.   Yes Historical Provider, MD  hydrochlorothiazide (MICROZIDE) 12.5 MG capsule Take 12.5 mg by mouth daily.   Yes Historical Provider, MD  lisinopril (PRINIVIL,ZESTRIL) 10 MG tablet Take 10 mg by mouth daily.   Yes Historical Provider, MD  LORazepam (ATIVAN) 0.5 MG tablet Take 0.25 mg by mouth 2 (two) times daily.   Yes Historical Provider, MD  metoprolol (LOPRESSOR) 50 MG tablet Take 50 mg by mouth 2 (two) times daily.   Yes Historical Provider, MD  Multiple Vitamin (MULTIVITAMIN WITH MINERALS) TABS tablet Take 1 tablet by mouth daily.   Yes Historical  Provider, MD  sucralfate (CARAFATE) 1 GM/10ML suspension Take 1 g by mouth 4 (four) times daily -  before meals and at bedtime.   Yes Historical Provider, MD  vitamin B-12 (CYANOCOBALAMIN) 1000 MCG tablet Take 1,000 mcg by mouth every other day.   Yes Historical Provider, MD  doxycycline (VIBRA-TABS) 100 MG tablet Take 1 tablet (100 mg total) by mouth 2 (two) times daily. 08/13/12   Kermit Balo, DO    Social History: Patient reports that she has quit smoking. Her smoking use included Cigarettes. She smoked 0.00 packs per day. She has never used smokeless tobacco. She reports that she does not drink alcohol or use illicit drugs.  Family History  Problem Relation Age of Onset  . Heart disease Mother   . Colon cancer Neg Hx     Review of Systems:  As per HPI and chief complaint. unobtainable form patient, but she has no current abdominal pain, vomiting or diarrhea. Also, no chest pain or shortness of breath.   Physical Exam:  General:  Patient does not appear to be in obvious acute distress. Alert, communicative, disoriented, talking in complete sentences, not short of breath at rest.  HEENT:  Mild clinical pallor, no jaundice, no conjunctival injection or discharge. Patient has a right periorbital bruise, in varying stages of evolution. Hydration is fair.  NECK:  Supple, JVP not seen, no carotid bruits, no palpable lymphadenopathy, no palpable goiter. CHEST:  Clinically clear to auscultation, no wheezes, no crackles. HEART:  Sounds 1 and 2 heard, normal, regular, no murmurs. ABDOMEN:  Full, soft, non-tender, no palpable organomegaly, no palpable masses, normal bowel sounds. GENITALIA:  Not examined. LOWER EXTREMITIES:  No pitting edema, palpable peripheral pulses. MUSCULOSKELETAL SYSTEM:  Generalized osteoarthritic changes, otherwise, normal. CENTRAL NERVOUS SYSTEM:  No focal neurologic deficit on gross examination.  Labs on Admission:  Results for orders placed during the hospital  encounter of 09/09/12 (from the past 48 hour(s))  CBC WITH DIFFERENTIAL     Status: Abnormal   Collection Time    09/09/12  9:54 AM      Result Value Range   WBC 8.0  4.0 - 10.5 K/uL   RBC 3.85 (*) 3.87 - 5.11 MIL/uL   Hemoglobin 11.3 (*) 12.0 - 15.0 g/dL   HCT 95.2 (*) 84.1 - 32.4 %   MCV 89.1  78.0 - 100.0 fL   MCH 29.4  26.0 - 34.0 pg   MCHC 32.9  30.0 - 36.0 g/dL   RDW 40.1  02.7 - 25.3 %   Platelets 196  150 - 400 K/uL   Neutrophils Relative % 60  43 - 77 %   Neutro Abs 4.8  1.7 - 7.7 K/uL   Lymphocytes Relative 29  12 - 46 %   Lymphs Abs 2.3  0.7 - 4.0 K/uL  Monocytes Relative 9  3 - 12 %   Monocytes Absolute 0.7  0.1 - 1.0 K/uL   Eosinophils Relative 2  0 - 5 %   Eosinophils Absolute 0.2  0.0 - 0.7 K/uL   Basophils Relative 0  0 - 1 %   Basophils Absolute 0.0  0.0 - 0.1 K/uL  BASIC METABOLIC PANEL     Status: Abnormal   Collection Time    09/09/12  9:54 AM      Result Value Range   Sodium 141  135 - 145 mEq/L   Potassium 3.7  3.5 - 5.1 mEq/L   Chloride 103  96 - 112 mEq/L   CO2 30  19 - 32 mEq/L   Glucose, Bld 77  70 - 99 mg/dL   BUN 20  6 - 23 mg/dL   Creatinine, Ser 1.61  0.50 - 1.10 mg/dL   Calcium 9.7  8.4 - 09.6 mg/dL   GFR calc non Af Amer 75 (*) >90 mL/min   GFR calc Af Amer 87 (*) >90 mL/min   Comment: (NOTE)     The eGFR has been calculated using the CKD EPI equation.     This calculation has not been validated in all clinical situations.     eGFR's persistently <90 mL/min signify possible Chronic Kidney     Disease.  TROPONIN I     Status: None   Collection Time    09/09/12  9:54 AM      Result Value Range   Troponin I <0.30  <0.30 ng/mL   Comment:            Due to the release kinetics of cTnI,     a negative result within the first hours     of the onset of symptoms does not rule out     myocardial infarction with certainty.     If myocardial infarction is still suspected,     repeat the test at appropriate intervals.  URINALYSIS, ROUTINE W  REFLEX MICROSCOPIC     Status: Abnormal   Collection Time    09/09/12 12:13 PM      Result Value Range   Color, Urine YELLOW  YELLOW   APPearance CLEAR  CLEAR   Specific Gravity, Urine 1.015  1.005 - 1.030   pH 7.5  5.0 - 8.0   Glucose, UA NEGATIVE  NEGATIVE mg/dL   Hgb urine dipstick NEGATIVE  NEGATIVE   Bilirubin Urine NEGATIVE  NEGATIVE   Ketones, ur NEGATIVE  NEGATIVE mg/dL   Protein, ur NEGATIVE  NEGATIVE mg/dL   Urobilinogen, UA 0.2  0.0 - 1.0 mg/dL   Nitrite NEGATIVE  NEGATIVE   Leukocytes, UA TRACE (*) NEGATIVE  URINE MICROSCOPIC-ADD ON     Status: None   Collection Time    09/09/12 12:13 PM      Result Value Range   Squamous Epithelial / LPF RARE  RARE   WBC, UA 0-2  <3 WBC/hpf   RBC / HPF 0-2  <3 RBC/hpf   Bacteria, UA RARE  RARE   Urine-Other LESS THAN 10 mL OF URINE SUBMITTED      Radiological Exams on Admission: Dg Chest 2 View  09/09/2012   CLINICAL DATA:  Fall, left-sided pain.  EXAM: CHEST  2 VIEW  COMPARISON:  04/07/2012  FINDINGS: Heart is borderline in size. Bibasilar scarring or atelectasis. No effusions or pneumothorax. No acute bony abnormality. Diffuse osteopenia throughout the thoracic spine. No visible rib fracture.  IMPRESSION: Bibasilar  scarring or atelectasis.   Electronically Signed   By: Charlett Nose   On: 09/09/2012 10:21   Dg Thoracic Spine 2 View  09/09/2012   CLINICAL DATA:  Fall, pain, back pain.  EXAM: THORACIC SPINE - 2 VIEW  COMPARISON:  Chest. Chest CT 04/06/2012  FINDINGS: There is mild to moderate compression fracture of the T11 vertebral body which is stable comparing to prior CT. No definite acute bony abnormality. No malalignment. Diffuse osteopenia and degenerative changes.  IMPRESSION: Stable moderate compression fracture at T11. No definite acute process.   Electronically Signed   By: Charlett Nose   On: 09/09/2012 10:35   Dg Pelvis 1-2 Views  09/09/2012   CLINICAL DATA:  Fall. Left-sided pain.  EXAM: PELVIS - 1-2 VIEW  COMPARISON:   None.  FINDINGS: There is no evidence of pelvic fracture or diastasis. No other pelvic bone lesions are seen. Early degenerative changes in the hips bilaterally.  IMPRESSION: No acute findings.   Electronically Signed   By: Charlett Nose   On: 09/09/2012 10:22   Ct Head Wo Contrast  09/09/2012   CLINICAL DATA:  Numerous falls bruising, swelling of her right cheek, orbits, forehead. No loss of consciousness.  EXAM: CT HEAD WITHOUT CONTRAST  CT MAXILLOFACIAL WITHOUT CONTRAST  CT CERVICAL SPINE WITHOUT CONTRAST  TECHNIQUE: Multidetector CT imaging of the head, cervical spine, and maxillofacial structures were performed using the standard protocol without intravenous contrast. Multiplanar CT image reconstructions of the cervical spine and maxillofacial structures were also generated.  COMPARISON:  01/10/2012  FINDINGS: CT HEAD FINDINGS  There is atrophy and chronic small vessel disease changes. No acute intracranial abnormality. Specifically, no hemorrhage, hydrocephalus, mass lesion, acute infarction, or significant intracranial injury. No acute calvarial abnormality. Mild soft tissue swelling over the right forehead.  CT MAXILLOFACIAL FINDINGS  No evidence of facial fracture. Paranasal sinuses are clear. Orbital soft tissues unremarkable.  CT CERVICAL SPINE FINDINGS  Advanced degenerative changes throughout cervical spine, most degenerative disc and facet disease. Normal alignment. Prevertebral soft tissues are normal. No fracture. No epidural or paraspinal hematoma. Soft tissue calcifications within the posterior soft tissues, likely related to old injury.  IMPRESSION: CT HEAD IMPRESSION  No acute intracranial abnormality.  Atrophy, chronic microvascular disease.  CT MAXILLOFACIAL IMPRESSION  No evidence of facial fracture.  CT CERVICAL SPINE IMPRESSION  No acute bony abnormality.   Electronically Signed   By: Charlett Nose   On: 09/09/2012 11:05   Ct Cervical Spine Wo Contrast  09/09/2012   CLINICAL DATA:   Numerous falls bruising, swelling of her right cheek, orbits, forehead. No loss of consciousness.  EXAM: CT HEAD WITHOUT CONTRAST  CT MAXILLOFACIAL WITHOUT CONTRAST  CT CERVICAL SPINE WITHOUT CONTRAST  TECHNIQUE: Multidetector CT imaging of the head, cervical spine, and maxillofacial structures were performed using the standard protocol without intravenous contrast. Multiplanar CT image reconstructions of the cervical spine and maxillofacial structures were also generated.  COMPARISON:  01/10/2012  FINDINGS: CT HEAD FINDINGS  There is atrophy and chronic small vessel disease changes. No acute intracranial abnormality. Specifically, no hemorrhage, hydrocephalus, mass lesion, acute infarction, or significant intracranial injury. No acute calvarial abnormality. Mild soft tissue swelling over the right forehead.  CT MAXILLOFACIAL FINDINGS  No evidence of facial fracture. Paranasal sinuses are clear. Orbital soft tissues unremarkable.  CT CERVICAL SPINE FINDINGS  Advanced degenerative changes throughout cervical spine, most degenerative disc and facet disease. Normal alignment. Prevertebral soft tissues are normal. No fracture. No epidural or paraspinal  hematoma. Soft tissue calcifications within the posterior soft tissues, likely related to old injury.  IMPRESSION: CT HEAD IMPRESSION  No acute intracranial abnormality.  Atrophy, chronic microvascular disease.  CT MAXILLOFACIAL IMPRESSION  No evidence of facial fracture.  CT CERVICAL SPINE IMPRESSION  No acute bony abnormality.   Electronically Signed   By: Charlett Nose   On: 09/09/2012 11:05   Ct Maxillofacial Wo Cm  09/09/2012   CLINICAL DATA:  Numerous falls bruising, swelling of her right cheek, orbits, forehead. No loss of consciousness.  EXAM: CT HEAD WITHOUT CONTRAST  CT MAXILLOFACIAL WITHOUT CONTRAST  CT CERVICAL SPINE WITHOUT CONTRAST  TECHNIQUE: Multidetector CT imaging of the head, cervical spine, and maxillofacial structures were performed using the  standard protocol without intravenous contrast. Multiplanar CT image reconstructions of the cervical spine and maxillofacial structures were also generated.  COMPARISON:  01/10/2012  FINDINGS: CT HEAD FINDINGS  There is atrophy and chronic small vessel disease changes. No acute intracranial abnormality. Specifically, no hemorrhage, hydrocephalus, mass lesion, acute infarction, or significant intracranial injury. No acute calvarial abnormality. Mild soft tissue swelling over the right forehead.  CT MAXILLOFACIAL FINDINGS  No evidence of facial fracture. Paranasal sinuses are clear. Orbital soft tissues unremarkable.  CT CERVICAL SPINE FINDINGS  Advanced degenerative changes throughout cervical spine, most degenerative disc and facet disease. Normal alignment. Prevertebral soft tissues are normal. No fracture. No epidural or paraspinal hematoma. Soft tissue calcifications within the posterior soft tissues, likely related to old injury.  IMPRESSION: CT HEAD IMPRESSION  No acute intracranial abnormality.  Atrophy, chronic microvascular disease.  CT MAXILLOFACIAL IMPRESSION  No evidence of facial fracture.  CT CERVICAL SPINE IMPRESSION  No acute bony abnormality.   Electronically Signed   By: Charlett Nose   On: 09/09/2012 11:05    Assessment/Plan Active Problems:    1. Generalized weakness: Patient presented with recurrent falls over the past few days, and appears unable to stand without assistance. Etiologies include progressive dementia, deconditioning, and medication side effect. Electrolytes appear normal, she is not clinically dehydrated, U/A is negative as is CXR. Patient is afebrile, and wcc is normal. Will admit, rationalize her medications, and check TSH and consult PT/OT.  2. Recurrent falls: This has been recurrent over the past few days, and patient reportedly fell twice on 09/06/12. Fortunately, extensive imaging in the ED, revealed no evidence of bony injuries. Fall precautions will be instituted,  and brain MRI obtained, to evaluate for possible cerebellar CVA, although she has no obvious focal neurologic deficit at this time. Will also check carotid dopplers and 2D Echocardiogram. Other etiologies include age-related gait dysfunction, deconditioning/generalized weakness and medications. Orthostatics will be checked, and patient placed on gentle ivi NS.  3. Dementia: This appears advanced, but stable.  4. HTN: BP is sub-optimally controlled at this time. Will continue pre-admission antihypertensives, apart from diuretics, and monitor. Adjustments may be needed. 5. CAD: Patient has known CAD, but is currently devoid of chest pain or SOB. Will cycle cardiac enzymes for completeness.  6. GERD: Asymptomatic.  7. Anemia: Patient is known to have vit B12 deficiency/iron-deficiency anemia. HB appears reasonable at 11.3, and MCV is 89.1. Will follow CBC.   Further management will depend on clinical course.   Comment: Patient is DNR.    Time Spent on Admission: 1 hour.   Vick Filter,CHRISTOPHER 09/09/2012, 2:16 PM

## 2012-09-09 NOTE — ED Notes (Signed)
Pt arrives to ed via [facility] for multiple falls over last 2 days.  ems sts staff at [facility] stated pt fell Saturday 2x.  Pt ca0x4.  PERRL right sluggish.  Nad. Pt denies pain anywhere.  No obvious injury or deformity. Pt has old hematoma to right orbital from previous fall.

## 2012-09-09 NOTE — ED Notes (Signed)
Ambulation.  Two person assistance.  Unsuccessful.

## 2012-09-09 NOTE — ED Provider Notes (Signed)
I saw and evaluated the patient, reviewed the resident's note and I agree with the findings and plan.  Pt has been having recurrent falls at the nursing facility.  Initial xrays and labs unremarkable.  Will consider MRI to evaluate for occult CVA with her recurrent falls.  Celene Kras, MD 09/09/12 [phone]

## 2012-09-09 NOTE — ED Notes (Signed)
In and out verbal order given.  Wayne-rn.  Sterile technique used throughout procedure.  Urine yellow and clear.  Patient tolerated well.

## 2012-09-09 NOTE — ED Provider Notes (Signed)
CSN: [id number]     Arrival date & time 09/09/12  0901 History     First MD Initiated Contact with Patient 09/09/12 [phone]     Chief Complaint  Patient presents with  . Fall   (Consider location/radiation/quality/duration/timing/severity/associated sxs/prior Treatment) Patient is a 79 y.o. female presenting with fall. The history is provided by the EMS personnel, the nursing home and the patient.  Fall This is a recurrent problem. The current episode started in the past 7 days. The problem occurs intermittently. The problem has been unchanged. Associated symptoms include neck pain. Pertinent negatives include no abdominal pain, chest pain, chills, coughing, diaphoresis, fatigue, fever, headaches, nausea, numbness, urinary symptoms, vertigo, visual change, vomiting or weakness. Associated symptoms comments: R eye pain. Nothing aggravates the symptoms. She has tried nothing for the symptoms. The treatment provided no relief.    Past Medical History  Diagnosis Date  . Diverticulosis of colon (without mention of hemorrhage) 06/24/2006  . Carcinoma of cecum 1992    History of  . Vitamin B12 deficiency   . Alzheimer's disease   . HTN (hypertension)   . Depression   . HLD (hyperlipidemia)   . Osteoporosis, unspecified   . PVD (peripheral vascular disease)   . CAD (coronary artery disease)   . GERD (gastroesophageal reflux disease)   . H. pylori infection 2004  . Presbyesophagus   . Iron deficiency anemia, unspecified 01/02/2012  . Personal history of fall 01/04/2009  . Unspecified constipation   . Hemorrhage of rectum and anus   . Chest pain, unspecified   . Acute pharyngitis   . Malignant neoplasm of colon, unspecified site   . Edema   . Cataract    Past Surgical History  Procedure Laterality Date  . Gallbladder surgery    . Abdominal hysterectomy  1995    with bso  . Right colectomy    . Cataract extraction  2001  . Cholecystectomy Right    Family History  Problem  Relation Age of Onset  . Heart disease Mother   . Colon cancer Neg Hx    History  Substance Use Topics  . Smoking status: Former Smoker    Types: Cigarettes  . Smokeless tobacco: Never Used  . Alcohol Use: No   OB History   Grav Para Term Preterm Abortions TAB SAB Ect Mult Living                 Review of Systems  Constitutional: Negative for fever, chills, diaphoresis and fatigue.  HENT: Positive for neck pain. Negative for neck stiffness.   Eyes: Negative for photophobia and visual disturbance.  Respiratory: Negative for cough, chest tightness, shortness of breath and wheezing.   Cardiovascular: Negative for chest pain and palpitations.  Gastrointestinal: Negative for nausea, vomiting, abdominal pain and abdominal distention.  Genitourinary: Negative for dysuria.  Musculoskeletal: Positive for back pain.  Skin: Positive for wound (R eye hematoma).  Neurological: Positive for dizziness. Negative for vertigo, speech difficulty, weakness, numbness and headaches.  All other systems reviewed and are negative.    Allergies  Captopril-hydrochlorothiazide; Niacin and related; Urispas; and Verapamil  Home Medications   Current Outpatient Rx  Name  Route  Sig  Dispense  Refill  . acetaminophen (TYLENOL) 650 MG CR tablet   Oral   Take 650 mg by mouth 3 (three) times daily.         Marland Kitchen alendronate (FOSAMAX) 70 MG tablet   Oral   Take 70 mg by mouth every 7 (  seven) days. Takes on Mondays. Take with a full glass of water on an empty stomach.         Marland Kitchen amLODipine (NORVASC) 10 MG tablet   Oral   Take 10 mg by mouth daily.         Marland Kitchen aspirin EC 81 MG tablet   Oral   Take 81 mg by mouth daily.         Marland Kitchen atorvastatin (LIPITOR) 10 MG tablet   Oral   Take 5 mg by mouth daily.         . Calcium Carbonate-Vitamin D (CALCIUM-VITAMIN D) 500-200 MG-UNIT per tablet   Oral   Take 1 tablet by mouth daily.         . cholecalciferol (VITAMIN D) 400 UNITS TABS tablet    Oral   Take 400 Units by mouth daily.         . divalproex (DEPAKOTE SPRINKLE) 125 MG capsule   Oral   Take 250 mg by mouth 2 (two) times daily.         Marland Kitchen donepezil (ARICEPT) 10 MG tablet   Oral   Take 10 mg by mouth at bedtime as needed.         . DULoxetine (CYMBALTA) 60 MG capsule   Oral   Take 60 mg by mouth daily.         . ferrous sulfate 325 (65 FE) MG tablet   Oral   Take 325 mg by mouth daily with breakfast.         . hydrochlorothiazide (MICROZIDE) 12.5 MG capsule   Oral   Take 12.5 mg by mouth daily.         Marland Kitchen lisinopril (PRINIVIL,ZESTRIL) 10 MG tablet   Oral   Take 10 mg by mouth daily.         Marland Kitchen LORazepam (ATIVAN) 0.5 MG tablet   Oral   Take 0.25 mg by mouth 2 (two) times daily.         . metoprolol (LOPRESSOR) 50 MG tablet   Oral   Take 50 mg by mouth 2 (two) times daily.         . Multiple Vitamin (MULTIVITAMIN WITH MINERALS) TABS tablet   Oral   Take 1 tablet by mouth daily.         . sucralfate (CARAFATE) 1 GM/10ML suspension   Oral   Take 1 g by mouth 4 (four) times daily -  before meals and at bedtime.         . vitamin B-12 (CYANOCOBALAMIN) 1000 MCG tablet   Oral   Take 1,000 mcg by mouth every other day.         Marland Kitchen doxycycline (VIBRA-TABS) 100 MG tablet   Oral   Take 1 tablet (100 mg total) by mouth 2 (two) times daily.   20 tablet   0    BP 136/95  Pulse 50  Temp(Src) 97.4 F (36.3 C) (Oral)  Resp 19  SpO2 95% Physical Exam  Nursing note and vitals reviewed. Constitutional: She is oriented to person, place, and time. She appears well-developed and well-nourished. No distress.  HENT:  Head: Normocephalic. Head is with contusion. Head is without Battle's sign, without abrasion and without laceration.  R periorbital old hematoma with TTP;  No bony instability  Eyes: EOM are normal. Pupils are equal, round, and reactive to light.  Neck:  C-collar in place  Cardiovascular: Normal rate, regular rhythm, normal  heart sounds and intact distal  pulses.   Pulmonary/Chest: Effort normal and breath sounds normal. No respiratory distress. She has no wheezes. She has no rales. She exhibits tenderness (R anterior chest).  Abdominal: Soft. Normal appearance and bowel sounds are normal. She exhibits no distension and no mass. There is no tenderness. There is no rebound and no guarding.  Musculoskeletal: Normal range of motion. She exhibits no edema.       Cervical back: She exhibits tenderness and bony tenderness. She exhibits no swelling, no edema and no deformity.       Thoracic back: She exhibits tenderness and bony tenderness. She exhibits no swelling, no edema and no deformity.       Lumbar back: She exhibits no tenderness, no bony tenderness, no swelling, no edema and no deformity.  Neurological: She is alert and oriented to person, place, and time. She has normal strength. No cranial nerve deficit or sensory deficit. She exhibits normal muscle tone. Coordination normal. GCS eye subscore is 4. GCS verbal subscore is 5. GCS motor subscore is 6.  Skin: Skin is warm and dry. She is not diaphoretic.  Skin tear to L elbow    ED Course   Procedures (including critical care time)  Labs Reviewed  CBC WITH DIFFERENTIAL - Abnormal; Notable for the following:    RBC 3.85 (*)    Hemoglobin 11.3 (*)    HCT 34.3 (*)    All other components within normal limits  BASIC METABOLIC PANEL - Abnormal; Notable for the following:    GFR calc non Af Amer 75 (*)    GFR calc Af Amer 87 (*)    All other components within normal limits  URINALYSIS, ROUTINE W REFLEX MICROSCOPIC - Abnormal; Notable for the following:    Leukocytes, UA TRACE (*)    All other components within normal limits  CBC - Abnormal; Notable for the following:    RBC 3.71 (*)    Hemoglobin 11.1 (*)    HCT 32.9 (*)    All other components within normal limits  TROPONIN I  URINE MICROSCOPIC-ADD ON  CREATININE, SERUM  TROPONIN I  TROPONIN I  TROPONIN  I  TSH   Dg Chest 2 View  09/09/2012   CLINICAL DATA:  Fall, left-sided pain.  EXAM: CHEST  2 VIEW  COMPARISON:  04/07/2012  FINDINGS: Heart is borderline in size. Bibasilar scarring or atelectasis. No effusions or pneumothorax. No acute bony abnormality. Diffuse osteopenia throughout the thoracic spine. No visible rib fracture.  IMPRESSION: Bibasilar scarring or atelectasis.   Electronically Signed   By: Charlett Nose   On: 09/09/2012 10:21   Dg Thoracic Spine 2 View  09/09/2012   CLINICAL DATA:  Fall, pain, back pain.  EXAM: THORACIC SPINE - 2 VIEW  COMPARISON:  Chest. Chest CT 04/06/2012  FINDINGS: There is mild to moderate compression fracture of the T11 vertebral body which is stable comparing to prior CT. No definite acute bony abnormality. No malalignment. Diffuse osteopenia and degenerative changes.  IMPRESSION: Stable moderate compression fracture at T11. No definite acute process.   Electronically Signed   By: Charlett Nose   On: 09/09/2012 10:35   Dg Pelvis 1-2 Views  09/09/2012   CLINICAL DATA:  Fall. Left-sided pain.  EXAM: PELVIS - 1-2 VIEW  COMPARISON:  None.  FINDINGS: There is no evidence of pelvic fracture or diastasis. No other pelvic bone lesions are seen. Early degenerative changes in the hips bilaterally.  IMPRESSION: No acute findings.   Electronically Signed  By: Charlett Nose   On: 09/09/2012 10:22   Ct Head Wo Contrast  09/09/2012   CLINICAL DATA:  Numerous falls bruising, swelling of her right cheek, orbits, forehead. No loss of consciousness.  EXAM: CT HEAD WITHOUT CONTRAST  CT MAXILLOFACIAL WITHOUT CONTRAST  CT CERVICAL SPINE WITHOUT CONTRAST  TECHNIQUE: Multidetector CT imaging of the head, cervical spine, and maxillofacial structures were performed using the standard protocol without intravenous contrast. Multiplanar CT image reconstructions of the cervical spine and maxillofacial structures were also generated.  COMPARISON:  01/10/2012  FINDINGS: CT HEAD FINDINGS  There is  atrophy and chronic small vessel disease changes. No acute intracranial abnormality. Specifically, no hemorrhage, hydrocephalus, mass lesion, acute infarction, or significant intracranial injury. No acute calvarial abnormality. Mild soft tissue swelling over the right forehead.  CT MAXILLOFACIAL FINDINGS  No evidence of facial fracture. Paranasal sinuses are clear. Orbital soft tissues unremarkable.  CT CERVICAL SPINE FINDINGS  Advanced degenerative changes throughout cervical spine, most degenerative disc and facet disease. Normal alignment. Prevertebral soft tissues are normal. No fracture. No epidural or paraspinal hematoma. Soft tissue calcifications within the posterior soft tissues, likely related to old injury.  IMPRESSION: CT HEAD IMPRESSION  No acute intracranial abnormality.  Atrophy, chronic microvascular disease.  CT MAXILLOFACIAL IMPRESSION  No evidence of facial fracture.  CT CERVICAL SPINE IMPRESSION  No acute bony abnormality.   Electronically Signed   By: Charlett Nose   On: 09/09/2012 11:05   Ct Cervical Spine Wo Contrast  09/09/2012   CLINICAL DATA:  Numerous falls bruising, swelling of her right cheek, orbits, forehead. No loss of consciousness.  EXAM: CT HEAD WITHOUT CONTRAST  CT MAXILLOFACIAL WITHOUT CONTRAST  CT CERVICAL SPINE WITHOUT CONTRAST  TECHNIQUE: Multidetector CT imaging of the head, cervical spine, and maxillofacial structures were performed using the standard protocol without intravenous contrast. Multiplanar CT image reconstructions of the cervical spine and maxillofacial structures were also generated.  COMPARISON:  01/10/2012  FINDINGS: CT HEAD FINDINGS  There is atrophy and chronic small vessel disease changes. No acute intracranial abnormality. Specifically, no hemorrhage, hydrocephalus, mass lesion, acute infarction, or significant intracranial injury. No acute calvarial abnormality. Mild soft tissue swelling over the right forehead.  CT MAXILLOFACIAL FINDINGS  No evidence  of facial fracture. Paranasal sinuses are clear. Orbital soft tissues unremarkable.  CT CERVICAL SPINE FINDINGS  Advanced degenerative changes throughout cervical spine, most degenerative disc and facet disease. Normal alignment. Prevertebral soft tissues are normal. No fracture. No epidural or paraspinal hematoma. Soft tissue calcifications within the posterior soft tissues, likely related to old injury.  IMPRESSION: CT HEAD IMPRESSION  No acute intracranial abnormality.  Atrophy, chronic microvascular disease.  CT MAXILLOFACIAL IMPRESSION  No evidence of facial fracture.  CT CERVICAL SPINE IMPRESSION  No acute bony abnormality.   Electronically Signed   By: Charlett Nose   On: 09/09/2012 11:05   Ct Maxillofacial Wo Cm  09/09/2012   CLINICAL DATA:  Numerous falls bruising, swelling of her right cheek, orbits, forehead. No loss of consciousness.  EXAM: CT HEAD WITHOUT CONTRAST  CT MAXILLOFACIAL WITHOUT CONTRAST  CT CERVICAL SPINE WITHOUT CONTRAST  TECHNIQUE: Multidetector CT imaging of the head, cervical spine, and maxillofacial structures were performed using the standard protocol without intravenous contrast. Multiplanar CT image reconstructions of the cervical spine and maxillofacial structures were also generated.  COMPARISON:  01/10/2012  FINDINGS: CT HEAD FINDINGS  There is atrophy and chronic small vessel disease changes. No acute intracranial abnormality. Specifically, no hemorrhage, hydrocephalus, mass  lesion, acute infarction, or significant intracranial injury. No acute calvarial abnormality. Mild soft tissue swelling over the right forehead.  CT MAXILLOFACIAL FINDINGS  No evidence of facial fracture. Paranasal sinuses are clear. Orbital soft tissues unremarkable.  CT CERVICAL SPINE FINDINGS  Advanced degenerative changes throughout cervical spine, most degenerative disc and facet disease. Normal alignment. Prevertebral soft tissues are normal. No fracture. No epidural or paraspinal hematoma. Soft  tissue calcifications within the posterior soft tissues, likely related to old injury.  IMPRESSION: CT HEAD IMPRESSION  No acute intracranial abnormality.  Atrophy, chronic microvascular disease.  CT MAXILLOFACIAL IMPRESSION  No evidence of facial fracture.  CT CERVICAL SPINE IMPRESSION  No acute bony abnormality.   Electronically Signed   By: Charlett Nose   On: 09/09/2012 11:05   1. Generalized weakness   2. Anemia   3. CAD (coronary artery disease)   4. Dementia      Date: 09/09/2012  Rate: 56  Rhythm: normal sinus rhythm  QRS Axis: normal  Intervals: normal  ST/T Wave abnormalities: nonspecific T wave changes  Conduction Disutrbances:none  Narrative Interpretation:   Old EKG Reviewed: unchanged   MDM  79 y.o. F presenting from [facility] facility s/p multiple falls over past few days.  Per EMS, pt has had several falls both witnessed and unwitnessed.  Most recent witnessed fall was 4 days ago.  However pt had R eye hematoma which appeared yesterday.  Nursing facility told EMS that pt is at her baseline mental status although transfer sheet states she has had increased lethargy and change in mental status.  Unclear how pt falls- pt is unsure, but states she has been dizzy for a few months.  Pt states she had fall yesterday in which she hit her head and lost consciousness.    On arrival, pt alert and oriented x 3.  She complains of pain in R eye, otherwise no complaints.  Physical exam with stable vital signs, GCS 15.  Pt moving all extremities spontaneously, no focal neuro deficits.  Old R periorbital hematoma with associated TTP present; no bony instability.  No other evidence of head trauma.  +C- and T- spine TTP.  Mild R anterior chest wall TTP.  Will check CT head and c-spine with T-spine XR, as well as CXR.  Given unclear mechanism of falls, will check labs, EKG, troponin, U/A.  Imaging, EKG, and lab results with no acute findings.  Attempted to walk pt however pt unable to ambulate  even with assistance.   Given recurrent falls and unclear if pt is having syncopal events prior to falling, in addition to generalized weakness, pt to be admitted for further workup.  Discussed with attending Dr. Lynelle Doctor.   Jodean Lima, MD 09/09/12 [phone]

## 2012-09-10 ENCOUNTER — Inpatient Hospital Stay (HOSPITAL_COMMUNITY): Payer: Medicare Other

## 2012-09-10 DIAGNOSIS — I6350 Cerebral infarction due to unspecified occlusion or stenosis of unspecified cerebral artery: Secondary | ICD-10-CM

## 2012-09-10 DIAGNOSIS — J984 Other disorders of lung: Secondary | ICD-10-CM

## 2012-09-10 DIAGNOSIS — I359 Nonrheumatic aortic valve disorder, unspecified: Secondary | ICD-10-CM

## 2012-09-10 LAB — BASIC METABOLIC PANEL
CO2: 25 mEq/L (ref 19–32)
GFR calc non Af Amer: 73 mL/min — ABNORMAL LOW (ref >90)
Glucose, Bld: 78 mg/dL (ref 70–99)
Potassium: 3.8 mEq/L (ref 3.5–5.1)
Sodium: 141 mEq/L (ref 135–145)

## 2012-09-10 LAB — CBC
Hemoglobin: 10.8 g/dL — ABNORMAL LOW (ref 12.0–15.0)
MCHC: 34 g/dL (ref 30.0–36.0)
Platelets: 197 10*3/uL (ref 150–400)
RBC: 3.59 MIL/uL — ABNORMAL LOW (ref 3.87–5.11)

## 2012-09-10 LAB — LIPID PANEL
Cholesterol: 142 mg/dL (ref 0–200)
Triglycerides: 104 mg/dL (ref <150)
VLDL: 21 mg/dL (ref 0–40)

## 2012-09-10 LAB — HEMOGLOBIN A1C
Hgb A1c MFr Bld: 5.7 % — ABNORMAL HIGH (ref <5.7)
Mean Plasma Glucose: 117 mg/dL — ABNORMAL HIGH (ref <117)

## 2012-09-10 MED ORDER — HALOPERIDOL LACTATE 5 MG/ML IJ SOLN
2.5000 mg | Freq: Once | INTRAMUSCULAR | Status: AC
  Administered 2012-09-10: 2.5 mg via INTRAVENOUS
  Filled 2012-09-10: qty 1

## 2012-09-10 NOTE — Progress Notes (Signed)
Echo Lab  2D Echocardiogram completed.  Collyn Ribas L Zeferino Mounts, RDCS 09/10/2012 9:37 AM

## 2012-09-10 NOTE — Evaluation (Signed)
Occupational Therapy Evaluation Patient Details Name: [name] MRN: [id number] DOB: April 06, 1922 Today's Date: 09/10/2012 Time: 8295-6213 OT Time Calculation (min): 10 min  OT Assessment / Plan / Recommendation History of present illness This is an 79 year old female resident of [facility], with history of Alzheimer's dementia, depression, CAD, PVD, HTN, Vit B12 deficiency, iron-deficiency anemia, dyslipidemia, osteoporosis, GERD, diverticulosis, colon cancer s/p right hemicolectomy, cataracts, s/p cataract extraction, s/p cholecystectomy, s/p TAH/BSO, brought to the ED, due to recurrent falls at the facility. As a matter of fact, fell twice on 09/06/12. Patient is demented and therefore unable to provide a history, so this was gleaned from ED MD . Patient evaluated with multiple imaging studies in the ED, including Head/Maxillo-facial/Cervical spine CT scans, X-Rays of thoracic spine and pelvis, as well as CXR, all of which revealed no acute findings. Per ED MD, patient is weak and unable to stand without assistance.     Clinical Impression   Patient evaluated by Occupational Therapy with no further acute OT needs identified. All education has been completed and the patient has no further questions. See below for any follow-up Occupational Therapy or equipment needs. OT to sign off. Thank you for referral.      OT Assessment  Patient does not need any further OT services    Follow Up Recommendations  SNF    Barriers to Discharge      Equipment Recommendations  None recommended by OT    Recommendations for Other Services    Frequency       Precautions / Restrictions Precautions Precautions: Fall (risk break down due to decr mobility)   Pertinent Vitals/Pain No pain    ADL  ADL Comments: Pt uncooperative during session. Refusing OOB. Pt with void of bowel and denies voided bowel when educated. Pt telling therapist to leave. Pt educated that the stool must be removed prior  to leaving patient. Pt educated that therapist will help clean up then she can return to sleeping. Pt again denies void bowel. Pt with pad total +2 (A) to log roll. PT resisting against therapist and not following commands. Pt total (A) for side lying. Pt slapping therapist forearm x2. Pt then remained on side calmly but verbalized therapist to leave. Pt total (A) for peri care. Pt log rolled opposite direction total +2 (A). pt slapping therapist forearm. Pt remained without resistance this time. pt with hygiene complete new linens placed. RN entering to give medication. Pt not currently appropriate for therapy. Pt told RN upon entry that we made her roll in the bed and she wanted therapy to leave.     OT Diagnosis:    OT Problem List:   OT Treatment Interventions:     OT Goals(Current goals can be found in the care plan section)    Visit Information  Last OT Received On: 09/10/12 Assistance Needed: +2 History of Present Illness: This is an 79 year old female resident of [address] [facility], with history of Alzheimer's dementia, depression, CAD, PVD, HTN, Vit B12 deficiency, iron-deficiency anemia, dyslipidemia, osteoporosis, GERD, diverticulosis, colon cancer s/p right hemicolectomy, cataracts, s/p cataract extraction, s/p cholecystectomy, s/p TAH/BSO, brought to the ED, due to recurrent falls at the facility. As a matter of fact, fell twice on 09/06/12. Patient is demented and therefore unable to provide a history, so this was gleaned from ED MD . Patient evaluated with multiple imaging studies in the ED, including Head/Maxillo-facial/Cervical spine CT scans, X-Rays of thoracic spine and pelvis, as  well as CXR, all of which revealed no acute findings. Per ED MD, patient is weak and unable to stand without assistance.         Prior Functioning     Home Living Family/patient expects to be discharged to:: Skilled nursing facility Additional Comments: no family present to give info related to pt's  function. Communication Communication: No difficulties (speaks in [place] at times) Dominant Hand: Right         Vision/Perception Vision - Assessment Additional Comments: unabel to test due to cognitive deficits   Cognition  Cognition Arousal/Alertness: Awake/alert Behavior During Therapy: Agitated Overall Cognitive Status: History of cognitive impairments - at baseline    Extremity/Trunk Assessment Upper Extremity Assessment Upper Extremity Assessment: Overall WFL for tasks assessed Lower Extremity Assessment Lower Extremity Assessment: Defer to PT evaluation     Mobility Bed Mobility Bed Mobility: Rolling Right;Rolling Left Rolling Right: 1: +2 Total assist Rolling Left: 1: +2 Total assist Transfers Transfers: Not assessed     Exercise     Balance     End of Session OT - End of Session Activity Tolerance: Patient tolerated treatment well Patient left: in bed;with call bell/phone within reach;with bed alarm set Nurse Communication: Precautions;Mobility status  GO     Harrel Carina [facility] 09/10/2012, 4:23 PM Pager: [phone]

## 2012-09-10 NOTE — Progress Notes (Signed)
[facility] PROGRESS NOTE  [name] AOZ:[id number] DOB: September 03, 1922 DOA: 09/09/2012 PCP: [name], MD  Assessment/Plan:  Generalized weakness: Patient presented with recurrent falls over the past few days, and appears unable to stand without assistance. Etiologies include progressive dementia, deconditioning, and medication side effect. Electrolytes appear normal, she is not clinically dehydrated, U/A is negative as is CXR. Patient is afebrile, and wcc is normal.   TSH ok  PT/OT.    Recurrent falls: This has been recurrent over the past few days, and patient reportedly fell twice on 09/06/12. Fortunately, extensive imaging in the ED, revealed no evidence of bony injuries. Fall precautions will be instituted, and brain MRI obtained, to evaluate for possible cerebellar CVA, although she has no obvious focal neurologic deficit at this time. Will also check carotid dopplers and 2D Echocardiogram. Other etiologies include age-related gait dysfunction, deconditioning/generalized weakness and medications. Orthostatics will be checked, and patient placed on gentle ivi NS.   Dementia: This appears advanced, but stable.    HTN: BP is sub-optimally controlled at this time. Will continue pre-admission antihypertensives, apart from diuretics, and monitor. Adjustments may be needed.   CAD: Patient has known CAD, but is currently devoid of chest pain or SOB. Will cycle cardiac enzymes for completeness.   GERD: Asymptomatic.   Anemia: Patient is known to have vit B12 deficiency/iron-deficiency anemia. HB appears reasonable at 11.3, and MCV is 89.1. Will follow CBC.    Code Status: full Family Communication: patient Disposition Plan: back to SNF 1-2 days   Consultants:  neuro  Procedures:    Antibiotics:    HPI/Subjective: No new c/o -wants to eat lunch  Objective: Filed Vitals:   09/10/12 0516  BP: 139/63  Pulse: 51  Temp: 98 F (36.7 C)  Resp: 16    Intake/Output  Summary (Last 24 hours) at 09/10/12 1043 Last data filed at 09/10/12 0900  Gross per 24 hour  Intake    240 ml  Output      0 ml  Net    240 ml   Filed Weights   09/10/12 0516  Weight: 55.339 kg (122 lb)    Exam:  General: Patient does not appear to be in obvious acute distress. Alert, communicative, disoriented, talking in complete sentences, not short of breath at rest.   CHEST: Clinically clear to auscultation, no wheezes, no crackles.  HEART: Sounds 1 and 2 heard, normal, regular, no murmurs.  ABDOMEN: Full, soft, non-tender, no palpable organomegaly, no palpable masses, normal bowel sounds.  LOWER EXTREMITIES: No pitting edema, palpable peripheral pulses.  MUSCULOSKELETAL SYSTEM: Generalized osteoarthritic changes, otherwise, normal.  CENTRAL NERVOUS SYSTEM: No focal neurologic deficit on gross examination.   Data Reviewed: Basic Metabolic Panel:  Recent Labs Lab 09/09/12 0954 09/09/12 1515 09/10/12 0215  NA 141  --  141  K 3.7  --  3.8  CL 103  --  105  CO2 30  --  25  GLUCOSE 77  --  78  BUN 20  --  25*  CREATININE 0.73 0.66 0.75  CALCIUM 9.7  --  9.3   Liver Function Tests: No results found for this basename: AST, ALT, ALKPHOS, BILITOT, PROT, ALBUMIN,  in the last 168 hours No results found for this basename: LIPASE, AMYLASE,  in the last 168 hours No results found for this basename: AMMONIA,  in the last 168 hours CBC:  Recent Labs Lab 09/09/12 0954 09/09/12 1515 09/10/12 0215  WBC 8.0 7.1 7.5  NEUTROABS 4.8  --   --  HGB 11.3* 11.1* 10.8*  HCT 34.3* 32.9* 31.8*  MCV 89.1 88.7 88.6  PLT 196 199 197   Cardiac Enzymes:  Recent Labs Lab 09/09/12 0954 09/09/12 1516 09/09/12 2011 09/10/12 0215  TROPONINI <0.30 <0.30 <0.30 <0.30   BNP (last 3 results) No results found for this basename: PROBNP,  in the last 8760 hours CBG: No results found for this basename: GLUCAP,  in the last 168 hours  Recent Results (from the past 240 hour(s))  MRSA  PCR SCREENING     Status: None   Collection Time    09/09/12  7:17 PM      Result Value Range Status   MRSA by PCR NEGATIVE  NEGATIVE Final   Comment:            The GeneXpert MRSA Assay (FDA     approved for NASAL specimens     only), is one component of a     comprehensive MRSA colonization     surveillance program. It is not     intended to diagnose MRSA     infection nor to guide or     monitor treatment for     MRSA infections.     Studies: Dg Chest 2 View  09/09/2012   CLINICAL DATA:  Fall, left-sided pain.  EXAM: CHEST  2 VIEW  COMPARISON:  04/07/2012  FINDINGS: Heart is borderline in size. Bibasilar scarring or atelectasis. No effusions or pneumothorax. No acute bony abnormality. Diffuse osteopenia throughout the thoracic spine. No visible rib fracture.  IMPRESSION: Bibasilar scarring or atelectasis.   Electronically Signed   By: Charlett Nose   On: 09/09/2012 10:21   Dg Thoracic Spine 2 View  09/09/2012   CLINICAL DATA:  Fall, pain, back pain.  EXAM: THORACIC SPINE - 2 VIEW  COMPARISON:  Chest. Chest CT 04/06/2012  FINDINGS: There is mild to moderate compression fracture of the T11 vertebral body which is stable comparing to prior CT. No definite acute bony abnormality. No malalignment. Diffuse osteopenia and degenerative changes.  IMPRESSION: Stable moderate compression fracture at T11. No definite acute process.   Electronically Signed   By: Charlett Nose   On: 09/09/2012 10:35   Dg Pelvis 1-2 Views  09/09/2012   CLINICAL DATA:  Fall. Left-sided pain.  EXAM: PELVIS - 1-2 VIEW  COMPARISON:  None.  FINDINGS: There is no evidence of pelvic fracture or diastasis. No other pelvic bone lesions are seen. Early degenerative changes in the hips bilaterally.  IMPRESSION: No acute findings.   Electronically Signed   By: Charlett Nose   On: 09/09/2012 10:22   Ct Head Wo Contrast  09/09/2012   CLINICAL DATA:  Numerous falls bruising, swelling of her right cheek, orbits, forehead. No loss of  consciousness.  EXAM: CT HEAD WITHOUT CONTRAST  CT MAXILLOFACIAL WITHOUT CONTRAST  CT CERVICAL SPINE WITHOUT CONTRAST  TECHNIQUE: Multidetector CT imaging of the head, cervical spine, and maxillofacial structures were performed using the standard protocol without intravenous contrast. Multiplanar CT image reconstructions of the cervical spine and maxillofacial structures were also generated.  COMPARISON:  01/10/2012  FINDINGS: CT HEAD FINDINGS  There is atrophy and chronic small vessel disease changes. No acute intracranial abnormality. Specifically, no hemorrhage, hydrocephalus, mass lesion, acute infarction, or significant intracranial injury. No acute calvarial abnormality. Mild soft tissue swelling over the right forehead.  CT MAXILLOFACIAL FINDINGS  No evidence of facial fracture. Paranasal sinuses are clear. Orbital soft tissues unremarkable.  CT CERVICAL SPINE FINDINGS  Advanced  degenerative changes throughout cervical spine, most degenerative disc and facet disease. Normal alignment. Prevertebral soft tissues are normal. No fracture. No epidural or paraspinal hematoma. Soft tissue calcifications within the posterior soft tissues, likely related to old injury.  IMPRESSION: CT HEAD IMPRESSION  No acute intracranial abnormality.  Atrophy, chronic microvascular disease.  CT MAXILLOFACIAL IMPRESSION  No evidence of facial fracture.  CT CERVICAL SPINE IMPRESSION  No acute bony abnormality.   Electronically Signed   By: Charlett Nose   On: 09/09/2012 11:05   Ct Cervical Spine Wo Contrast  09/09/2012   CLINICAL DATA:  Numerous falls bruising, swelling of her right cheek, orbits, forehead. No loss of consciousness.  EXAM: CT HEAD WITHOUT CONTRAST  CT MAXILLOFACIAL WITHOUT CONTRAST  CT CERVICAL SPINE WITHOUT CONTRAST  TECHNIQUE: Multidetector CT imaging of the head, cervical spine, and maxillofacial structures were performed using the standard protocol without intravenous contrast. Multiplanar CT image  reconstructions of the cervical spine and maxillofacial structures were also generated.  COMPARISON:  01/10/2012  FINDINGS: CT HEAD FINDINGS  There is atrophy and chronic small vessel disease changes. No acute intracranial abnormality. Specifically, no hemorrhage, hydrocephalus, mass lesion, acute infarction, or significant intracranial injury. No acute calvarial abnormality. Mild soft tissue swelling over the right forehead.  CT MAXILLOFACIAL FINDINGS  No evidence of facial fracture. Paranasal sinuses are clear. Orbital soft tissues unremarkable.  CT CERVICAL SPINE FINDINGS  Advanced degenerative changes throughout cervical spine, most degenerative disc and facet disease. Normal alignment. Prevertebral soft tissues are normal. No fracture. No epidural or paraspinal hematoma. Soft tissue calcifications within the posterior soft tissues, likely related to old injury.  IMPRESSION: CT HEAD IMPRESSION  No acute intracranial abnormality.  Atrophy, chronic microvascular disease.  CT MAXILLOFACIAL IMPRESSION  No evidence of facial fracture.  CT CERVICAL SPINE IMPRESSION  No acute bony abnormality.   Electronically Signed   By: Charlett Nose   On: 09/09/2012 11:05   Ct Maxillofacial Wo Cm  09/09/2012   CLINICAL DATA:  Numerous falls bruising, swelling of her right cheek, orbits, forehead. No loss of consciousness.  EXAM: CT HEAD WITHOUT CONTRAST  CT MAXILLOFACIAL WITHOUT CONTRAST  CT CERVICAL SPINE WITHOUT CONTRAST  TECHNIQUE: Multidetector CT imaging of the head, cervical spine, and maxillofacial structures were performed using the standard protocol without intravenous contrast. Multiplanar CT image reconstructions of the cervical spine and maxillofacial structures were also generated.  COMPARISON:  01/10/2012  FINDINGS: CT HEAD FINDINGS  There is atrophy and chronic small vessel disease changes. No acute intracranial abnormality. Specifically, no hemorrhage, hydrocephalus, mass lesion, acute infarction, or significant  intracranial injury. No acute calvarial abnormality. Mild soft tissue swelling over the right forehead.  CT MAXILLOFACIAL FINDINGS  No evidence of facial fracture. Paranasal sinuses are clear. Orbital soft tissues unremarkable.  CT CERVICAL SPINE FINDINGS  Advanced degenerative changes throughout cervical spine, most degenerative disc and facet disease. Normal alignment. Prevertebral soft tissues are normal. No fracture. No epidural or paraspinal hematoma. Soft tissue calcifications within the posterior soft tissues, likely related to old injury.  IMPRESSION: CT HEAD IMPRESSION  No acute intracranial abnormality.  Atrophy, chronic microvascular disease.  CT MAXILLOFACIAL IMPRESSION  No evidence of facial fracture.  CT CERVICAL SPINE IMPRESSION  No acute bony abnormality.   Electronically Signed   By: Charlett Nose   On: 09/09/2012 11:05    Scheduled Meds: . acetaminophen  650 mg Oral TID  . amLODipine  10 mg Oral Daily  . aspirin EC  81 mg Oral Daily  .  atorvastatin  5 mg Oral Daily  . calcium-vitamin D  1 tablet Oral Q breakfast  . cholecalciferol  400 Units Oral Daily  . divalproex  250 mg Oral BID  . donepezil  10 mg Oral QHS  . DULoxetine  60 mg Oral Daily  . ferrous sulfate  325 mg Oral Q breakfast  . heparin  5,000 Units Subcutaneous Q8H  . lisinopril  10 mg Oral Daily  . metoprolol  50 mg Oral BID  . multivitamin with minerals  1 tablet Oral Daily  . sucralfate  1 g Oral TID AC & HS  . vitamin B-12  1,000 mcg Oral QODAY   Continuous Infusions: . sodium chloride      Active Problems:   Generalized weakness   Recurrent falls   Anemia    Time spent: 35    [facility], Lavetta Geier  [facility] Pager [phone]. If 7PM-7AM, please contact night-coverage at [email or web address], password [facility] 09/10/2012, 10:43 AM  LOS: 1 day

## 2012-09-10 NOTE — Progress Notes (Signed)
OT / PT Cancellation Note  Patient Details Name: [name] MRN: [id number] DOB: October 01, 1922   Cancelled Treatment:    Reason Eval/Treat Not Completed: Patient at procedure or test/ unavailable (MRI)  Lucile Shutters Pager: [phone]  09/10/2012, 10:03 AM

## 2012-09-10 NOTE — Evaluation (Signed)
Physical Therapy Evaluation Patient Details Name: [name] MRN: [id number] DOB: 09/24/1922 Today's Date: 09/10/2012 Time: 4098-1191 PT Time Calculation (min): 15 min  PT Assessment / Plan / Recommendation History of Present Illness  This is an 79 year old female resident of [facility], with history of Alzheimer's dementia, depression, CAD, PVD, HTN, Vit B12 deficiency, iron-deficiency anemia, dyslipidemia, osteoporosis, GERD, diverticulosis, colon cancer s/p right hemicolectomy, cataracts, s/p cataract extraction, s/p cholecystectomy, s/p TAH/BSO, brought to the ED, due to recurrent falls at the facility. As a matter of fact, fell twice on 09/06/12. Patient is demented and therefore unable to provide a history, so this was gleaned from ED MD . Patient evaluated with multiple imaging studies in the ED, including Head/Maxillo-facial/Cervical spine CT scans, X-Rays of thoracic spine and pelvis, as well as CXR, all of which revealed no acute findings. Per ED MD, patient is weak and unable to stand without assistance.    Clinical Impression  Presents to PT with below listed impairments. Patient unwilling/unable to cooperate for therapy evaluation due to behavior impairment. Would benefit from further PT however this can be addressed at the next venue.     PT Assessment  All further PT needs can be met in the next venue of care    Follow Up Recommendations  SNF    Does the patient have the potential to tolerate intense rehabilitation      Barriers to Discharge        Equipment Recommendations  None recommended by PT    Recommendations for Other Services     Frequency      Precautions / Restrictions Precautions Precautions: Fall (risk break down due to decr mobility)   Pertinent Vitals/Pain Denies pain      Mobility  Bed Mobility Bed Mobility: Rolling Right;Rolling Left Rolling Right: 1: +2 Total assist Rolling Left: 1: +2 Total assist Details for Bed Mobility  Assistance: no patient effort, very resistant, swatting at therapist, used pad to roll her to assist with pericare (pt soiled with bowel) Transfers Transfers: Not assessed Ambulation/Gait Ambulation/Gait Assistance: Not tested (comment)    Exercises     PT Diagnosis: Generalized weakness;Difficulty walking  PT Problem List: Decreased balance;Decreased strength;Decreased cognition;Decreased mobility PT Treatment Interventions:       PT Goals(Current goals can be found in the care plan section) Acute Rehab PT Goals PT Goal Formulation: No goals set, d/c therapy  Visit Information  Last PT Received On: 09/10/12 Assistance Needed: +2 History of Present Illness: This is an 79 year old female resident of [address] [facility], with history of Alzheimer's dementia, depression, CAD, PVD, HTN, Vit B12 deficiency, iron-deficiency anemia, dyslipidemia, osteoporosis, GERD, diverticulosis, colon cancer s/p right hemicolectomy, cataracts, s/p cataract extraction, s/p cholecystectomy, s/p TAH/BSO, brought to the ED, due to recurrent falls at the facility. As a matter of fact, fell twice on 09/06/12. Patient is demented and therefore unable to provide a history, so this was gleaned from ED MD . Patient evaluated with multiple imaging studies in the ED, including Head/Maxillo-facial/Cervical spine CT scans, X-Rays of thoracic spine and pelvis, as well as CXR, all of which revealed no acute findings. Per ED MD, patient is weak and unable to stand without assistance.         Prior Functioning  Home Living Family/patient expects to be discharged to:: Skilled nursing facility Additional Comments: no family present to give info related to pt's function. Communication Communication: No difficulties (speaks in [place] at times) Dominant Hand: Right  Cognition  Cognition Arousal/Alertness: Awake/alert Behavior During Therapy: Agitated Overall Cognitive Status: History of cognitive impairments - at baseline     Extremity/Trunk Assessment Upper Extremity Assessment Upper Extremity Assessment: Overall WFL for tasks assessed Lower Extremity Assessment Lower Extremity Assessment: Very strong and resistant to any mobility  Balance    End of Session PT - End of Session Activity Tolerance: Treatment limited secondary to agitation Patient left: in bed;with call bell/phone within reach;with bed alarm set  GP     Ludger Nutting 09/10/2012, 5:43 PM

## 2012-09-10 NOTE — Progress Notes (Signed)
VASCULAR LAB PRELIMINARY  PRELIMINARY  PRELIMINARY  PRELIMINARY  Carotid duplex completed.    Preliminary report:  Bilateral:  1-39% ICA stenosis.  Vertebral artery flow is antegrade.      Antonyo Hinderer, RVT 09/10/2012, 9:58 AM

## 2012-09-11 MED ORDER — HALOPERIDOL LACTATE 5 MG/ML IJ SOLN: 2.0000 mg | Freq: Four times a day (QID) | INTRAMUSCULAR | Status: DC | PRN

## 2012-09-11 MED ORDER — HALOPERIDOL 2 MG PO TABS
2.0000 mg | ORAL_TABLET | Freq: Four times a day (QID) | ORAL | Status: DC | PRN
  Administered 2012-09-12: 2 mg via ORAL
  Filled 2012-09-11: qty 1

## 2012-09-11 NOTE — Clinical Social Work Placement (Signed)
Clinical Social Work Department CLINICAL SOCIAL WORK PLACEMENT NOTE 09/11/2012  Patient:  [name], [name]  Account Number:  [id number] Admit date:  09/09/2012  Clinical Social Worker:  Irving Burton [place], LCSWA  Date/time:  09/11/2012 03:06 PM  Clinical Social Work is seeking post-discharge placement for this patient at the following level of care:   SKILLED NURSING   (*CSW will update this form in Epic as items are completed)   09/11/2012  Patient/family provided with [facility] Department of Clinical Social Works list of facilities offering this level of care within the geographic area requested by the patient (or if unable, by the patients family).  09/11/2012  Patient/family informed of their freedom to choose among providers that offer the needed level of care, that participate in Medicare, Medicaid or managed care program needed by the patient, have an available bed and are willing to accept the patient.  09/11/2012  Patient/family informed of [facility] ownership interest in [facility], as well as of the fact that they are under no obligation to receive care at this facility.  PASARR submitted to EDS on 09/11/2012 PASARR number received from EDS on 09/11/2012  FL2 transmitted to all facilities in geographic area requested by pt/family on  09/11/2012 FL2 transmitted to all facilities within larger geographic area on   Patient informed that his/her managed care company has contracts with or will negotiate with  certain facilities, including the following:     Patient/family informed of bed offers received:  09/11/2012 Patient chooses bed at [facility], [place] Physician recommends and patient chooses bed at  [facility], [place]  Patient to be transferred to [facility], [place] on   Patient to be transferred to facility by   The following physician request were entered in Epic:   Additional Comments:  Darlyn Chamber,  MSW, LCSWA Clinical Social Work [phone]

## 2012-09-11 NOTE — Progress Notes (Signed)
[facility] PROGRESS NOTE  Amanda BEVACQUA ZOX:[id number] DOB: 07/26/1922 DOA: 09/09/2012 PCP: [name], MD  Assessment/Plan:  Generalized weakness: Patient presented with recurrent falls over the past few days, and appears unable to stand without assistance. Etiologies include progressive dementia, deconditioning, and medication side effect. Electrolytes appear normal, she is not clinically dehydrated, U/A is negative as is CXR. Patient is afebrile, and wcc is normal.   TSH ok  PT/OT- snf -limit narcotics/benzos.    Recurrent falls: This has been recurrent over the past few days, and patient reportedly fell twice on 09/06/12. Fortunately, extensive imaging in the ED, revealed no evidence of bony injuries. Fall precautions will be instituted, and brain MRI obtained, to evaluate for possible cerebellar CVA, although she has no obvious focal neurologic deficit at this time. Will also check carotid dopplers and 2D Echocardiogram. Other etiologies include age-related gait dysfunction, deconditioning/generalized weakness and medications. Orthostatics will be checked, and patient placed on gentle ivi NS.   Dementia: This appears advanced, but stable; PRN haldol   HTN: BP is sub-optimally controlled at this time. Will continue pre-admission antihypertensives, apart from diuretics, and monitor. Adjustments may be needed.   CAD: Patient has known CAD, but is currently devoid of chest pain or SOB. Will cycle cardiac enzymes for completeness.   GERD: Asymptomatic.   Anemia: Patient is known to have vit B12 deficiency/iron-deficiency anemia. HB appears reasonable at 11.3, and MCV is 89.1. Will follow CBC.    Code Status: full Family Communication: patient Disposition Plan: back to SNF in am   Consultants:  neuro  Procedures:    Antibiotics:    HPI/Subjective: Had some sun downing last night Pleasant/cooperative this AM  Objective: Filed Vitals:   09/11/12 0623  BP: 132/38   Pulse: 51  Temp: 97.5 F (36.4 C)  Resp: 16    Intake/Output Summary (Last 24 hours) at 09/11/12 0910 Last data filed at 09/10/12 1700  Gross per 24 hour  Intake    240 ml  Output      0 ml  Net    240 ml   Filed Weights   09/10/12 0516  Weight: 55.339 kg (122 lb)    Exam:  General: Patient does not appear to be in obvious acute distress. Alert, communicative, disoriented, talking in complete sentences, not short of breath at rest.   CHEST: Clinically clear to auscultation, no wheezes, no crackles.  HEART: Sounds 1 and 2 heard, normal, regular, no murmurs.  ABDOMEN: Full, soft, non-tender, no palpable organomegaly, no palpable masses, normal bowel sounds.  LOWER EXTREMITIES: No pitting edema, palpable peripheral pulses.  MUSCULOSKELETAL SYSTEM: Generalized osteoarthritic changes, otherwise, normal.  CENTRAL NERVOUS SYSTEM: No focal neurologic deficit on gross examination.   Data Reviewed: Basic Metabolic Panel:  Recent Labs Lab 09/09/12 0954 09/09/12 1515 09/10/12 0215  NA 141  --  141  K 3.7  --  3.8  CL 103  --  105  CO2 30  --  25  GLUCOSE 77  --  78  BUN 20  --  25*  CREATININE 0.73 0.66 0.75  CALCIUM 9.7  --  9.3   Liver Function Tests: No results found for this basename: AST, ALT, ALKPHOS, BILITOT, PROT, ALBUMIN,  in the last 168 hours No results found for this basename: LIPASE, AMYLASE,  in the last 168 hours No results found for this basename: AMMONIA,  in the last 168 hours CBC:  Recent Labs Lab 09/09/12 0954 09/09/12 1515 09/10/12 0215  WBC 8.0  7.1 7.5  NEUTROABS 4.8  --   --   HGB 11.3* 11.1* 10.8*  HCT 34.3* 32.9* 31.8*  MCV 89.1 88.7 88.6  PLT 196 199 197   Cardiac Enzymes:  Recent Labs Lab 09/09/12 0954 09/09/12 1516 09/09/12 2011 09/10/12 0215  TROPONINI <0.30 <0.30 <0.30 <0.30   BNP (last 3 results) No results found for this basename: PROBNP,  in the last 8760 hours CBG: No results found for this basename: GLUCAP,  in the  last 168 hours  Recent Results (from the past 240 hour(s))  MRSA PCR SCREENING     Status: None   Collection Time    09/09/12  7:17 PM      Result Value Range Status   MRSA by PCR NEGATIVE  NEGATIVE Final   Comment:            The GeneXpert MRSA Assay (FDA     approved for NASAL specimens     only), is one component of a     comprehensive MRSA colonization     surveillance program. It is not     intended to diagnose MRSA     infection nor to guide or     monitor treatment for     MRSA infections.     Studies: Dg Chest 2 View  09/09/2012   CLINICAL DATA:  Fall, left-sided pain.  EXAM: CHEST  2 VIEW  COMPARISON:  04/07/2012  FINDINGS: Heart is borderline in size. Bibasilar scarring or atelectasis. No effusions or pneumothorax. No acute bony abnormality. Diffuse osteopenia throughout the thoracic spine. No visible rib fracture.  IMPRESSION: Bibasilar scarring or atelectasis.   Electronically Signed   By: Charlett Nose   On: 09/09/2012 10:21   Dg Thoracic Spine 2 View  09/09/2012   CLINICAL DATA:  Fall, pain, back pain.  EXAM: THORACIC SPINE - 2 VIEW  COMPARISON:  Chest. Chest CT 04/06/2012  FINDINGS: There is mild to moderate compression fracture of the T11 vertebral body which is stable comparing to prior CT. No definite acute bony abnormality. No malalignment. Diffuse osteopenia and degenerative changes.  IMPRESSION: Stable moderate compression fracture at T11. No definite acute process.   Electronically Signed   By: Charlett Nose   On: 09/09/2012 10:35   Dg Pelvis 1-2 Views  09/09/2012   CLINICAL DATA:  Fall. Left-sided pain.  EXAM: PELVIS - 1-2 VIEW  COMPARISON:  None.  FINDINGS: There is no evidence of pelvic fracture or diastasis. No other pelvic bone lesions are seen. Early degenerative changes in the hips bilaterally.  IMPRESSION: No acute findings.   Electronically Signed   By: Charlett Nose   On: 09/09/2012 10:22   Ct Head Wo Contrast  09/09/2012   CLINICAL DATA:  Numerous falls  bruising, swelling of her right cheek, orbits, forehead. No loss of consciousness.  EXAM: CT HEAD WITHOUT CONTRAST  CT MAXILLOFACIAL WITHOUT CONTRAST  CT CERVICAL SPINE WITHOUT CONTRAST  TECHNIQUE: Multidetector CT imaging of the head, cervical spine, and maxillofacial structures were performed using the standard protocol without intravenous contrast. Multiplanar CT image reconstructions of the cervical spine and maxillofacial structures were also generated.  COMPARISON:  01/10/2012  FINDINGS: CT HEAD FINDINGS  There is atrophy and chronic small vessel disease changes. No acute intracranial abnormality. Specifically, no hemorrhage, hydrocephalus, mass lesion, acute infarction, or significant intracranial injury. No acute calvarial abnormality. Mild soft tissue swelling over the right forehead.  CT MAXILLOFACIAL FINDINGS  No evidence of facial fracture. Paranasal sinuses are  clear. Orbital soft tissues unremarkable.  CT CERVICAL SPINE FINDINGS  Advanced degenerative changes throughout cervical spine, most degenerative disc and facet disease. Normal alignment. Prevertebral soft tissues are normal. No fracture. No epidural or paraspinal hematoma. Soft tissue calcifications within the posterior soft tissues, likely related to old injury.  IMPRESSION: CT HEAD IMPRESSION  No acute intracranial abnormality.  Atrophy, chronic microvascular disease.  CT MAXILLOFACIAL IMPRESSION  No evidence of facial fracture.  CT CERVICAL SPINE IMPRESSION  No acute bony abnormality.   Electronically Signed   By: Charlett Nose   On: 09/09/2012 11:05   Ct Cervical Spine Wo Contrast  09/09/2012   CLINICAL DATA:  Numerous falls bruising, swelling of her right cheek, orbits, forehead. No loss of consciousness.  EXAM: CT HEAD WITHOUT CONTRAST  CT MAXILLOFACIAL WITHOUT CONTRAST  CT CERVICAL SPINE WITHOUT CONTRAST  TECHNIQUE: Multidetector CT imaging of the head, cervical spine, and maxillofacial structures were performed using the standard  protocol without intravenous contrast. Multiplanar CT image reconstructions of the cervical spine and maxillofacial structures were also generated.  COMPARISON:  01/10/2012  FINDINGS: CT HEAD FINDINGS  There is atrophy and chronic small vessel disease changes. No acute intracranial abnormality. Specifically, no hemorrhage, hydrocephalus, mass lesion, acute infarction, or significant intracranial injury. No acute calvarial abnormality. Mild soft tissue swelling over the right forehead.  CT MAXILLOFACIAL FINDINGS  No evidence of facial fracture. Paranasal sinuses are clear. Orbital soft tissues unremarkable.  CT CERVICAL SPINE FINDINGS  Advanced degenerative changes throughout cervical spine, most degenerative disc and facet disease. Normal alignment. Prevertebral soft tissues are normal. No fracture. No epidural or paraspinal hematoma. Soft tissue calcifications within the posterior soft tissues, likely related to old injury.  IMPRESSION: CT HEAD IMPRESSION  No acute intracranial abnormality.  Atrophy, chronic microvascular disease.  CT MAXILLOFACIAL IMPRESSION  No evidence of facial fracture.  CT CERVICAL SPINE IMPRESSION  No acute bony abnormality.   Electronically Signed   By: Charlett Nose   On: 09/09/2012 11:05   Mr Brain Wo Contrast  09/10/2012   *RADIOLOGY REPORT*  Clinical Data:  Numerous falls. Dementia.  Hypertension and hyperlipidemia.  Colon cancer.  MRI BRAIN WITHOUT CONTRAST MRA HEAD WITHOUT CONTRAST  Technique: Multiplanar, multiecho pulse sequences of the brain and surrounding structures were obtained according to standard protocol without intravenous contrast.  Angiographic images of the head were obtained using MRA technique without contrast.  Comparison: 09/09/2012 CT.  12/24/2011 MR.  MRI HEAD  Findings:  Motion degraded exam.  No acute infarct.  Right frontal/supraorbital subcutaneous hematoma.  No intracranial hemorrhage.  Moderate global atrophy.  Ventricular prominence probably related to  atrophy although difficult to completely exclude a mild component hydrocephalus.  Appearance without significant change.  Prominent small vessel disease type changes.  No intracranial mass lesion detected on this unenhanced exam.  Cervical spondylotic changes with spinal stenosis of the upper cervical spine.  Minimal paranasal sinus mucosal thickening.  IMPRESSION: No acute infarct.  Right frontal/supraorbital subcutaneous hematoma.  No intracranial hemorrhage.  Moderate global atrophy.  Ventricular prominence probably related to atrophy although difficult to completely exclude a mild component hydrocephalus.  Appearance without significant change.  Prominent small vessel disease type changes.  MRA HEAD  Findings: Fetal type origin of the posterior cerebral arteries.  Hypoplastic A1 segment left anterior cerebral artery.  Mild narrowing M1 segment middle cerebral artery bilaterally. Moderate to marked narrowing of branches of the middle cerebral artery bilaterally greater on the right.  Small vertebral arteries and basilar artery  bilaterally.  Narrowing of portions of the distal vertebral arteries and proximal basilar artery.  PICAs and AICAs not visualized.  Attenuated superior cerebellar artery bilaterally.  Narrowed distal posterior cerebral artery branches more notable on the left.  No aneurysm noted.  Previously questioned left PCOM aneurysm appears to be posterior communicating artery itself.  IMPRESSION: Intracranial atherosclerotic type changes as noted above.   Original Report Authenticated By: Lacy Duverney, M.D.   Mr Mra Head/brain Wo Cm  09/10/2012   *RADIOLOGY REPORT*  Clinical Data:  Numerous falls. Dementia.  Hypertension and hyperlipidemia.  Colon cancer.  MRI BRAIN WITHOUT CONTRAST MRA HEAD WITHOUT CONTRAST  Technique: Multiplanar, multiecho pulse sequences of the brain and surrounding structures were obtained according to standard protocol without intravenous contrast.  Angiographic images of the  head were obtained using MRA technique without contrast.  Comparison: 09/09/2012 CT.  12/24/2011 MR.  MRI HEAD  Findings:  Motion degraded exam.  No acute infarct.  Right frontal/supraorbital subcutaneous hematoma.  No intracranial hemorrhage.  Moderate global atrophy.  Ventricular prominence probably related to atrophy although difficult to completely exclude a mild component hydrocephalus.  Appearance without significant change.  Prominent small vessel disease type changes.  No intracranial mass lesion detected on this unenhanced exam.  Cervical spondylotic changes with spinal stenosis of the upper cervical spine.  Minimal paranasal sinus mucosal thickening.  IMPRESSION: No acute infarct.  Right frontal/supraorbital subcutaneous hematoma.  No intracranial hemorrhage.  Moderate global atrophy.  Ventricular prominence probably related to atrophy although difficult to completely exclude a mild component hydrocephalus.  Appearance without significant change.  Prominent small vessel disease type changes.  MRA HEAD  Findings: Fetal type origin of the posterior cerebral arteries.  Hypoplastic A1 segment left anterior cerebral artery.  Mild narrowing M1 segment middle cerebral artery bilaterally. Moderate to marked narrowing of branches of the middle cerebral artery bilaterally greater on the right.  Small vertebral arteries and basilar artery bilaterally.  Narrowing of portions of the distal vertebral arteries and proximal basilar artery.  PICAs and AICAs not visualized.  Attenuated superior cerebellar artery bilaterally.  Narrowed distal posterior cerebral artery branches more notable on the left.  No aneurysm noted.  Previously questioned left PCOM aneurysm appears to be posterior communicating artery itself.  IMPRESSION: Intracranial atherosclerotic type changes as noted above.   Original Report Authenticated By: Lacy Duverney, M.D.   Ct Maxillofacial Wo Cm  09/09/2012   CLINICAL DATA:  Numerous falls bruising,  swelling of her right cheek, orbits, forehead. No loss of consciousness.  EXAM: CT HEAD WITHOUT CONTRAST  CT MAXILLOFACIAL WITHOUT CONTRAST  CT CERVICAL SPINE WITHOUT CONTRAST  TECHNIQUE: Multidetector CT imaging of the head, cervical spine, and maxillofacial structures were performed using the standard protocol without intravenous contrast. Multiplanar CT image reconstructions of the cervical spine and maxillofacial structures were also generated.  COMPARISON:  01/10/2012  FINDINGS: CT HEAD FINDINGS  There is atrophy and chronic small vessel disease changes. No acute intracranial abnormality. Specifically, no hemorrhage, hydrocephalus, mass lesion, acute infarction, or significant intracranial injury. No acute calvarial abnormality. Mild soft tissue swelling over the right forehead.  CT MAXILLOFACIAL FINDINGS  No evidence of facial fracture. Paranasal sinuses are clear. Orbital soft tissues unremarkable.  CT CERVICAL SPINE FINDINGS  Advanced degenerative changes throughout cervical spine, most degenerative disc and facet disease. Normal alignment. Prevertebral soft tissues are normal. No fracture. No epidural or paraspinal hematoma. Soft tissue calcifications within the posterior soft tissues, likely related to old injury.  IMPRESSION: CT HEAD  IMPRESSION  No acute intracranial abnormality.  Atrophy, chronic microvascular disease.  CT MAXILLOFACIAL IMPRESSION  No evidence of facial fracture.  CT CERVICAL SPINE IMPRESSION  No acute bony abnormality.   Electronically Signed   By: Charlett Nose   On: 09/09/2012 11:05    Scheduled Meds: . acetaminophen  650 mg Oral TID  . amLODipine  10 mg Oral Daily  . aspirin EC  81 mg Oral Daily  . atorvastatin  5 mg Oral Daily  . calcium-vitamin D  1 tablet Oral Q breakfast  . cholecalciferol  400 Units Oral Daily  . divalproex  250 mg Oral BID  . donepezil  10 mg Oral QHS  . DULoxetine  60 mg Oral Daily  . ferrous sulfate  325 mg Oral Q breakfast  . heparin  5,000  Units Subcutaneous Q8H  . lisinopril  10 mg Oral Daily  . metoprolol  50 mg Oral BID  . multivitamin with minerals  1 tablet Oral Daily  . sucralfate  1 g Oral TID AC & HS  . vitamin B-12  1,000 mcg Oral QODAY   Continuous Infusions:    Active Problems:   Generalized weakness   Recurrent falls   Anemia    Time spent: 35    [facility], Leani Myron  [facility] Pager [phone]. If 7PM-7AM, please contact night-coverage at [email or web address], password [facility] 09/11/2012, 9:10 AM  LOS: 2 days

## 2012-09-11 NOTE — Clinical Social Work Psychosocial (Signed)
Clinical Social Work Department BRIEF PSYCHOSOCIAL ASSESSMENT 09/11/2012  Patient:  [name], [name]     Account Number:  [id number]     Admit date:  09/09/2012  Clinical Social Worker:  Sherre Lain  Date/Time:  09/11/2012 03:02 PM  Referred by:  Physician  Date Referred:  09/11/2012 Referred for  SNF Placement   Other Referral:   none.   Interview type:  Patient Other interview type:   none.    PSYCHOSOCIAL DATA Living Status:  FACILITY Admitted from facility:  [facility] [place] Level of care:  Skilled Nursing Facility Primary support name:  none. Primary support relationship to patient:   Degree of support available:   Pt has a son but CSW was unable to contact him. [facility] stated that they also have trouble with contacting pt's son.    CURRENT CONCERNS Current Concerns  Other - See comment   Other Concerns:   Lack of support.    SOCIAL WORK ASSESSMENT / PLAN CSW was contacted by [place] regarding pt returning to their facility upon discharge. CSW to assist with facilitating discharge planning to SNF once pt is medically discharged from [facility].   Assessment/plan status:  Psychosocial Support/Ongoing Assessment of Needs Other assessment/ plan:   none.   Information/referral to community resources:   SNF community list.    PATIENTS/FAMILYS RESPONSE TO PLAN OF CARE: CSW spoke with CSW Director regarding CSW plan of care for pt.       Darlyn Chamber, MSW, LCSWA Clinical Social Work [phone]

## 2012-09-12 ENCOUNTER — Encounter: Payer: Self-pay | Admitting: Internal Medicine

## 2012-09-12 ENCOUNTER — Non-Acute Institutional Stay (SKILLED_NURSING_FACILITY): Payer: Medicare Other | Admitting: Internal Medicine

## 2012-09-12 DIAGNOSIS — E860 Dehydration: Secondary | ICD-10-CM

## 2012-09-12 DIAGNOSIS — I2510 Atherosclerotic heart disease of native coronary artery without angina pectoris: Secondary | ICD-10-CM

## 2012-09-12 DIAGNOSIS — E785 Hyperlipidemia, unspecified: Secondary | ICD-10-CM | POA: Insufficient documentation

## 2012-09-12 DIAGNOSIS — F329 Major depressive disorder, single episode, unspecified: Secondary | ICD-10-CM

## 2012-09-12 DIAGNOSIS — R5381 Other malaise: Secondary | ICD-10-CM

## 2012-09-12 DIAGNOSIS — Z9181 History of falling: Secondary | ICD-10-CM

## 2012-09-12 DIAGNOSIS — K219 Gastro-esophageal reflux disease without esophagitis: Secondary | ICD-10-CM

## 2012-09-12 DIAGNOSIS — R296 Repeated falls: Secondary | ICD-10-CM

## 2012-09-12 DIAGNOSIS — R531 Weakness: Secondary | ICD-10-CM

## 2012-09-12 DIAGNOSIS — I10 Essential (primary) hypertension: Secondary | ICD-10-CM

## 2012-09-12 DIAGNOSIS — D649 Anemia, unspecified: Secondary | ICD-10-CM

## 2012-09-12 DIAGNOSIS — F0390 Unspecified dementia without behavioral disturbance: Secondary | ICD-10-CM

## 2012-09-12 MED ORDER — SENNOSIDES-DOCUSATE SODIUM 8.6-50 MG PO TABS: 1.0000 | ORAL_TABLET | Freq: Every evening | ORAL | Status: DC | PRN

## 2012-09-12 MED ORDER — HALOPERIDOL 2 MG PO TABS: 2.0000 mg | ORAL_TABLET | Freq: Four times a day (QID) | ORAL | Status: DC | PRN

## 2012-09-12 NOTE — Progress Notes (Signed)
MRN: [id number] Name: HOLLIE WOJAHN  Sex: female Age: 79 y.o. DOB: 14-Jul-1922  PSC #: [facility] Facility/Room: 112 Level Of Care: SNF Provider: Merrilee Seashore D Emergency Contacts: Extended Emergency Contact Information Primary Emergency Contact: Ricki Rodriguez [place] of [place] Mobile Phone: [phone] Relation: Son Secondary Emergency Contact: [name],[name] Address: [address]          [place], [place] [address] [place] Home Phone: [phone] Relation: Other  Code Status: DNR  Allergies: Captopril-hydrochlorothiazide; Niacin and related; Urispas; and Verapamil  Chief Complaint  Patient presents with  . nursing home admission    HPI: Patient is 79 y.o. female who was seen at the hospital for recurrent falls. She is readmitted for her other multiple chronic med problems, especially dementia.  Past Medical History  Diagnosis Date  . Diverticulosis of colon (without mention of hemorrhage) 06/24/2006  . Carcinoma of cecum 1992    History of  . Vitamin B12 deficiency   . Alzheimer's disease   . HTN (hypertension)   . Depression   . Osteoporosis, unspecified   . PVD (peripheral vascular disease)   . CAD (coronary artery disease)   . GERD (gastroesophageal reflux disease)   . H. pylori infection 2004  . Presbyesophagus   . Iron deficiency anemia, unspecified 01/02/2012  . Personal history of fall 01/04/2009  . Unspecified constipation   . Hemorrhage of rectum and anus   . Chest pain, unspecified   . Acute pharyngitis   . Malignant neoplasm of colon, unspecified site   . Edema   . Cataract   . HLD (hyperlipidemia)     Past Surgical History  Procedure Laterality Date  . Gallbladder surgery    . Abdominal hysterectomy  1995    with bso  . Right colectomy    . Cataract extraction  2001  . Cholecystectomy Right       Medication List       This list is accurate as of: 09/12/12  4:16 PM.  Always use your most recent med list.                acetaminophen 650 MG CR tablet  Commonly known as:  TYLENOL  Take 650 mg by mouth 3 (three) times daily.     alendronate 70 MG tablet  Commonly known as:  FOSAMAX  Take 70 mg by mouth every 7 (seven) days. Takes on Mondays. Take with a full glass of water on an empty stomach.     amLODipine 10 MG tablet  Commonly known as:  NORVASC  Take 10 mg by mouth daily.     aspirin EC 81 MG tablet  Take 81 mg by mouth daily.     atorvastatin 10 MG tablet  Commonly known as:  LIPITOR  Take 5 mg by mouth daily.     calcium-vitamin D 500-200 MG-UNIT per tablet  Take 1 tablet by mouth daily.     cholecalciferol 400 UNITS Tabs tablet  Commonly known as:  VITAMIN D  Take 400 Units by mouth daily.     divalproex 125 MG capsule  Commonly known as:  DEPAKOTE SPRINKLE  Take 250 mg by mouth 2 (two) times daily.     donepezil 10 MG tablet  Commonly known as:  ARICEPT  Take 10 mg by mouth at bedtime as needed.     DULoxetine 60 MG capsule  Commonly known as:  CYMBALTA  Take 60 mg by mouth daily.     ferrous sulfate 325 (  65 FE) MG tablet  Take 325 mg by mouth daily with breakfast.     lisinopril 10 MG tablet  Commonly known as:  PRINIVIL,ZESTRIL  Take 10 mg by mouth daily.     LORazepam 0.5 MG tablet  Commonly known as:  ATIVAN  Take 0.25 mg by mouth 2 (two) times daily as needed for anxiety.     metoprolol 50 MG tablet  Commonly known as:  LOPRESSOR  Take 50 mg by mouth 2 (two) times daily.     multivitamin with minerals Tabs tablet  Take 1 tablet by mouth daily.     senna-docusate 8.6-50 MG per tablet  Commonly known as:  Senokot-S  Take 1 tablet by mouth at bedtime as needed.     sucralfate 1 GM/10ML suspension  Commonly known as:  CARAFATE  Take 1 g by mouth 4 (four) times daily -  before meals and at bedtime.     vitamin B-12 1000 MCG tablet  Commonly known as:  CYANOCOBALAMIN  Take 1,000 mcg by mouth every other day.        Meds ordered this encounter   Medications  . LORazepam (ATIVAN) 0.5 MG tablet    Sig: Take 0.25 mg by mouth 2 (two) times daily as needed for anxiety.    Immunization History  Administered Date(s) Administered  . Influenza Whole 10/22/2004  . PPD Test 08/03/2009  . Pneumococcal Polysaccharide 01/23/1999    History  Substance Use Topics  . Smoking status: Former Smoker    Types: Cigarettes  . Smokeless tobacco: Never Used  . Alcohol Use: No    Family history is noncontributory    Review of Systems  DATA OBTAINED: from patient, nurse, medical record, family member GENERAL: Feels well no fevers, fatigue, appetite changes SKIN: No itching, rash or wounds EYES: No eye pain, redness, discharge EARS: No earache, tinnitus, change in hearing NOSE: No congestion, drainage or bleeding  MOUTH/THROAT: No mouth or tooth pain, No sore throat, No difficulty chewing or swallowing  RESPIRATORY: No cough, wheezing, SOB CARDIAC: No chest pain, palpitations, lower extremity edema  GI: No abdominal pain, No N/V/D or constipation, No heartburn or reflux  GU: No dysuria, frequency or urgency, or incontinence  MUSCULOSKELETAL: No unrelieved bone/joint pain NEUROLOGIC: Awake, alert, appropriate to situation, No change in mental status. Moves all four, no focal deficits PSYCHIATRIC: No overt anxiety or sadness. Sleeps well. No behavior issue.  AMBULATION: thinks may be PT will help her walk better   Filed Vitals:   09/12/12 1551  BP: 115/54  Pulse: 95  Temp: 98 F (36.7 C)  Resp: 20    Physical Exam  GENERAL APPEARANCE: Alert, conversant. Appropriately groomed. No acute distress.  SKIN: No diaphoresis rash, or wounds HEAD: Normocephalic, atraumatic  EYES: Conjunctiva/lids clear. Pupils round, reactive. EOMs intact.  EARS: External exam WNL, canals clear. Hearing grossly normal.  NOSE: No deformity or discharge.  MOUTH/THROAT: Lips w/o lesions. Mouth and throat normal. Tongue moist, w/o lesion.  NECK: No thyroid  tenderness, enlargement or nodule  RESPIRATORY: Breathing is even, unlabored. Lung sounds are clear   CARDIOVASCULAR: Heart RRR no murmurs, rubs or gallops. No peripheral edema.  ARTERIAL: radial pulse 2+ GASTROINTESTINAL: Abdomen is soft, non-tender, not distended w/ normal bowel sounds. No mass, ventral or inguinal hernia. No organomegally GENITOURINARY: Bladder non tender, not distended  MUSCULOSKELETAL: No abnormal joints or musculature NEUROLOGIC: Oriented X2 Cranial nerves 2-12 grossly intact. Moves all extremities no tremor. PSYCHIATRIC: dementia Mood and affect, no  behavioral issues  Patient Active Problem List   Diagnosis Date Noted  . HLD (hyperlipidemia)   . Generalized weakness 09/09/2012  . Recurrent falls 09/09/2012  . Anemia 09/09/2012  . Anemia, iron deficiency 04/16/2012  . Sepsis 04/06/2012  . Acute respiratory distress 04/06/2012  . Fever 04/06/2012  . Dehydration 04/06/2012  . Sinus tachycardia 04/06/2012  . Depression 04/06/2012  . CAD (coronary artery disease) 04/06/2012  . HCAP (healthcare-associated pneumonia) 04/06/2012  . Encephalopathy acute 12/28/2011  . Dementia 12/28/2011  . Dizziness 12/24/2011  . HTN (hypertension) 12/24/2011  . Dysphagia 08/07/2011  . GERD (gastroesophageal reflux disease) 08/07/2011       CBC    Component Value Date/Time   WBC 7.5 09/10/2012 0215   RBC 3.59* 09/10/2012 0215   HGB 10.8* 09/10/2012 0215   HCT 31.8* 09/10/2012 0215   PLT 197 09/10/2012 0215   MCV 88.6 09/10/2012 0215   LYMPHSABS 2.3 09/09/2012 0954   MONOABS 0.7 09/09/2012 0954   EOSABS 0.2 09/09/2012 0954   BASOSABS 0.0 09/09/2012 0954    CMP     Component Value Date/Time   NA 141 09/10/2012 0215   K 3.8 09/10/2012 0215   CL 105 09/10/2012 0215   CO2 25 09/10/2012 0215   GLUCOSE 78 09/10/2012 0215   BUN 25* 09/10/2012 0215   CREATININE 0.75 09/10/2012 0215   CALCIUM 9.3 09/10/2012 0215   PROT 5.4* 04/07/2012 0202   ALBUMIN 2.7* 04/07/2012 0202   AST 45*  04/07/2012 0202   ALT 39* 04/07/2012 0202   ALKPHOS 52 04/07/2012 0202   BILITOT 0.7 04/07/2012 0202   GFRNONAA 73* 09/10/2012 0215   GFRAA 85* 09/10/2012 0215    Assessment and Plan  HTN (hypertension) Stable-continue present meds;diuretics were stopped in hospital -will restart if necessary  GERD (gastroesophageal reflux disease) carafate 4 times daily  Dementia Noted stable in hosp-will continue aricept  Depression Continue cymbalta for same  Anemia Hb is stable - continue iron  CAD (coronary artery disease) No CP or SOB;Cardiac enzymes were neg  Generalized weakness Major w/u at hospital-felt secondary to progressive dementia, deconditioning, and benzo side effects;electrolytes were nl, pt not dehydrated, U/A and CXR were neg, no fever, no white count,TSH -nl;haldo rec for agitation by hospital but because reluctance to use haldol in geriatric pt's, especially the oldest ones, we will continue ativan 0.25 mg BID prn;I spoke with her nurse at length and she does not believe the ativan a reason for falls-it hasn't made her sleepy or insteady in the past, xanax didn;t help when used in past  Recurrent falls Felt to be due to sedating meds;w/u ruled out broken bones or new CVA  HLD (hyperlipidemia) On no meds and lipid panel 8/20 was very acceptable    Margit Hanks, MD

## 2012-09-12 NOTE — Assessment & Plan Note (Signed)
Hb is stable - continue iron

## 2012-09-12 NOTE — Assessment & Plan Note (Signed)
Noted stable in hosp-will continue aricept

## 2012-09-12 NOTE — Assessment & Plan Note (Signed)
carafate 4 times daily

## 2012-09-12 NOTE — Assessment & Plan Note (Addendum)
Major w/u at hospital-felt secondary to progressive dementia, deconditioning, and benzo side effects;electrolytes were nl, pt not dehydrated, U/A and CXR were neg, no fever, no white count,TSH -nl;haldo rec for agitation by hospital but because reluctance to use haldol in geriatric pt's, especially the oldest ones, we will continue ativan 0.25 mg BID prn;I spoke with her nurse at length and she does not believe the ativan a reason for falls-it hasn't made her sleepy or insteady in the past, xanax didn;t help when used in past

## 2012-09-12 NOTE — Discharge Summary (Signed)
Physician Discharge Summary  [name] XBJ:[id number] DOB: [birth year] DOA: 09/09/2012  PCP: [name], MD  Admit date: 09/09/2012 Discharge date: 09/12/2012  Time spent: 35 minutes  Recommendations for Outpatient Follow-up:  Back to SNF  Discharge Diagnoses:  Active Problems:   Generalized weakness   Recurrent falls   Anemia   Discharge Condition: improved  Diet recommendation: cardiac  Filed Weights   09/10/12 0516  Weight: 55.339 kg (122 lb)    History of present illness:  This is an 79 year old female resident of [facility], with history of Alzheimer's dementia, depression, CAD, PVD, HTN, Vit B12 deficiency, iron-deficiency anemia, dyslipidemia, osteoporosis, GERD, diverticulosis, colon cancer s/p right hemicolectomy, cataracts, s/p cataract extraction, s/p cholecystectomy, s/p TAH/BSO, brought to the ED, due to recurrent falls at the facility. As a matter of fact, fell twice on 09/06/12. Patient is demented and therefore unable to provide a history, so this was gleaned from ED MD . Patient evaluated with multiple imaging studies in the ED, including Head/Maxillo-facial/Cervical spine CT scans, X-Rays of thoracic spine and pelvis, as well as CXR, all of which revealed no acute findings. Per ED MD, patient is weak and unable to stand without assistance   Hospital Course:  Generalized weakness: Patient presented with recurrent falls over the past few days, and appears unable to stand without assistance. Etiologies include progressive dementia, deconditioning, and medication side effect. Electrolytes appear normal, she is not clinically dehydrated, U/A is negative as is CXR. Patient is afebrile, and wcc is normal.  TSH ok  PT/OT- snf  -limit narcotics/benzos.   Recurrent falls: This has been recurrent over the past few days, and patient reportedly fell twice on 09/06/12. Fortunately, extensive imaging in the ED, revealed no evidence of bony injuries. Fall precautions  will be instituted, and brain MRI obtained, to evaluate for possible cerebellar CVA, although she has no obvious focal neurologic deficit at this time.  -work up unremarkable- d/c'd sedating medications  Dementia: This appears advanced, but stable; PRN haldol   HTN: hold diuretics; adjust as outpatient  CAD: Patient has known CAD, but is currently devoid of chest pain or SOB. CE neg  GERD: Asymptomatic.   Anemia: Patient is known to have vit B12 deficiency/iron-deficiency anemia. HB appears reasonable at 11.3, and MCV is 89.1.    Procedures:  none  Consultations:  none  Discharge Exam: Filed Vitals:   09/12/12 0549  BP: 146/68  Pulse: 53  Temp: 98 F (36.7 C)  Resp: 20    General: pleasant/cooperative, NAD Cardiovascular: rrr Respiratory: clear anterior  Discharge Instructions      Discharge Orders   Future Orders Complete By Expires   Diet - low sodium heart healthy  As directed    Discharge instructions  As directed    Comments:     Avoid benzos and other sedating mediations Haldol for agitation Fall risk   Increase activity slowly  As directed        Medication List    STOP taking these medications       doxycycline 100 MG tablet  Commonly known as:  VIBRA-TABS     hydrochlorothiazide 12.5 MG capsule  Commonly known as:  MICROZIDE     LORazepam 0.5 MG tablet  Commonly known as:  ATIVAN      TAKE these medications       acetaminophen 650 MG CR tablet  Commonly known as:  TYLENOL  Take 650 mg by mouth 3 (three) times daily.  alendronate 70 MG tablet  Commonly known as:  FOSAMAX  Take 70 mg by mouth every 7 (seven) days. Takes on Mondays. Take with a full glass of water on an empty stomach.     amLODipine 10 MG tablet  Commonly known as:  NORVASC  Take 10 mg by mouth daily.     aspirin EC 81 MG tablet  Take 81 mg by mouth daily.     atorvastatin 10 MG tablet  Commonly known as:  LIPITOR  Take 5 mg by mouth daily.      calcium-vitamin D 500-200 MG-UNIT per tablet  Take 1 tablet by mouth daily.     cholecalciferol 400 UNITS Tabs tablet  Commonly known as:  VITAMIN D  Take 400 Units by mouth daily.     divalproex 125 MG capsule  Commonly known as:  DEPAKOTE SPRINKLE  Take 250 mg by mouth 2 (two) times daily.     donepezil 10 MG tablet  Commonly known as:  ARICEPT  Take 10 mg by mouth at bedtime as needed.     DULoxetine 60 MG capsule  Commonly known as:  CYMBALTA  Take 60 mg by mouth daily.     ferrous sulfate 325 (65 FE) MG tablet  Take 325 mg by mouth daily with breakfast.     haloperidol 2 MG tablet  Commonly known as:  HALDOL  Take 1 tablet (2 mg total) by mouth every 6 (six) hours as needed.     lisinopril 10 MG tablet  Commonly known as:  PRINIVIL,ZESTRIL  Take 10 mg by mouth daily.     metoprolol 50 MG tablet  Commonly known as:  LOPRESSOR  Take 50 mg by mouth 2 (two) times daily.     multivitamin with minerals Tabs tablet  Take 1 tablet by mouth daily.     senna-docusate 8.6-50 MG per tablet  Commonly known as:  Senokot-S  Take 1 tablet by mouth at bedtime as needed.     sucralfate 1 GM/10ML suspension  Commonly known as:  CARAFATE  Take 1 g by mouth 4 (four) times daily -  before meals and at bedtime.     vitamin B-12 1000 MCG tablet  Commonly known as:  CYANOCOBALAMIN  Take 1,000 mcg by mouth every other day.       Allergies  Allergen Reactions  . Captopril-Hydrochlorothiazide   . Niacin And Related   . Urispas [Flavoxate]   . Verapamil    Follow-up Information   Follow up with [name], MD In 1 week.   Specialty:  Family Medicine   Contact information:   [address] [place] [address] [phone]        The results of significant diagnostics from this hospitalization (including imaging, microbiology, ancillary and laboratory) are listed below for reference.    Significant Diagnostic Studies: Dg Chest 2 View  09/09/2012    CLINICAL DATA:  Fall, left-sided pain.  EXAM: CHEST  2 VIEW  COMPARISON:  04/07/2012  FINDINGS: Heart is borderline in size. Bibasilar scarring or atelectasis. No effusions or pneumothorax. No acute bony abnormality. Diffuse osteopenia throughout the thoracic spine. No visible rib fracture.  IMPRESSION: Bibasilar scarring or atelectasis.   Electronically Signed   By: Charlett Nose   On: 09/09/2012 10:21   Dg Thoracic Spine 2 View  09/09/2012   CLINICAL DATA:  Fall, pain, back pain.  EXAM: THORACIC SPINE - 2 VIEW  COMPARISON:  Chest. Chest CT 04/06/2012  FINDINGS:  There is mild to moderate compression fracture of the T11 vertebral body which is stable comparing to prior CT. No definite acute bony abnormality. No malalignment. Diffuse osteopenia and degenerative changes.  IMPRESSION: Stable moderate compression fracture at T11. No definite acute process.   Electronically Signed   By: Charlett Nose   On: 09/09/2012 10:35   Dg Pelvis 1-2 Views  09/09/2012   CLINICAL DATA:  Fall. Left-sided pain.  EXAM: PELVIS - 1-2 VIEW  COMPARISON:  None.  FINDINGS: There is no evidence of pelvic fracture or diastasis. No other pelvic bone lesions are seen. Early degenerative changes in the hips bilaterally.  IMPRESSION: No acute findings.   Electronically Signed   By: Charlett Nose   On: 09/09/2012 10:22   Ct Head Wo Contrast  09/09/2012   CLINICAL DATA:  Numerous falls bruising, swelling of her right cheek, orbits, forehead. No loss of consciousness.  EXAM: CT HEAD WITHOUT CONTRAST  CT MAXILLOFACIAL WITHOUT CONTRAST  CT CERVICAL SPINE WITHOUT CONTRAST  TECHNIQUE: Multidetector CT imaging of the head, cervical spine, and maxillofacial structures were performed using the standard protocol without intravenous contrast. Multiplanar CT image reconstructions of the cervical spine and maxillofacial structures were also generated.  COMPARISON:  01/10/2012  FINDINGS: CT HEAD FINDINGS  There is atrophy and chronic small vessel disease  changes. No acute intracranial abnormality. Specifically, no hemorrhage, hydrocephalus, mass lesion, acute infarction, or significant intracranial injury. No acute calvarial abnormality. Mild soft tissue swelling over the right forehead.  CT MAXILLOFACIAL FINDINGS  No evidence of facial fracture. Paranasal sinuses are clear. Orbital soft tissues unremarkable.  CT CERVICAL SPINE FINDINGS  Advanced degenerative changes throughout cervical spine, most degenerative disc and facet disease. Normal alignment. Prevertebral soft tissues are normal. No fracture. No epidural or paraspinal hematoma. Soft tissue calcifications within the posterior soft tissues, likely related to old injury.  IMPRESSION: CT HEAD IMPRESSION  No acute intracranial abnormality.  Atrophy, chronic microvascular disease.  CT MAXILLOFACIAL IMPRESSION  No evidence of facial fracture.  CT CERVICAL SPINE IMPRESSION  No acute bony abnormality.   Electronically Signed   By: Charlett Nose   On: 09/09/2012 11:05   Ct Cervical Spine Wo Contrast  09/09/2012   CLINICAL DATA:  Numerous falls bruising, swelling of her right cheek, orbits, forehead. No loss of consciousness.  EXAM: CT HEAD WITHOUT CONTRAST  CT MAXILLOFACIAL WITHOUT CONTRAST  CT CERVICAL SPINE WITHOUT CONTRAST  TECHNIQUE: Multidetector CT imaging of the head, cervical spine, and maxillofacial structures were performed using the standard protocol without intravenous contrast. Multiplanar CT image reconstructions of the cervical spine and maxillofacial structures were also generated.  COMPARISON:  01/10/2012  FINDINGS: CT HEAD FINDINGS  There is atrophy and chronic small vessel disease changes. No acute intracranial abnormality. Specifically, no hemorrhage, hydrocephalus, mass lesion, acute infarction, or significant intracranial injury. No acute calvarial abnormality. Mild soft tissue swelling over the right forehead.  CT MAXILLOFACIAL FINDINGS  No evidence of facial fracture. Paranasal sinuses are  clear. Orbital soft tissues unremarkable.  CT CERVICAL SPINE FINDINGS  Advanced degenerative changes throughout cervical spine, most degenerative disc and facet disease. Normal alignment. Prevertebral soft tissues are normal. No fracture. No epidural or paraspinal hematoma. Soft tissue calcifications within the posterior soft tissues, likely related to old injury.  IMPRESSION: CT HEAD IMPRESSION  No acute intracranial abnormality.  Atrophy, chronic microvascular disease.  CT MAXILLOFACIAL IMPRESSION  No evidence of facial fracture.  CT CERVICAL SPINE IMPRESSION  No acute bony abnormality.   Electronically  Signed   By: Charlett Nose   On: 09/09/2012 11:05   Mr Brain Wo Contrast  09/10/2012   *RADIOLOGY REPORT*  Clinical Data:  Numerous falls. Dementia.  Hypertension and hyperlipidemia.  Colon cancer.  MRI BRAIN WITHOUT CONTRAST MRA HEAD WITHOUT CONTRAST  Technique: Multiplanar, multiecho pulse sequences of the brain and surrounding structures were obtained according to standard protocol without intravenous contrast.  Angiographic images of the head were obtained using MRA technique without contrast.  Comparison: 09/09/2012 CT.  12/24/2011 MR.  MRI HEAD  Findings:  Motion degraded exam.  No acute infarct.  Right frontal/supraorbital subcutaneous hematoma.  No intracranial hemorrhage.  Moderate global atrophy.  Ventricular prominence probably related to atrophy although difficult to completely exclude a mild component hydrocephalus.  Appearance without significant change.  Prominent small vessel disease type changes.  No intracranial mass lesion detected on this unenhanced exam.  Cervical spondylotic changes with spinal stenosis of the upper cervical spine.  Minimal paranasal sinus mucosal thickening.  IMPRESSION: No acute infarct.  Right frontal/supraorbital subcutaneous hematoma.  No intracranial hemorrhage.  Moderate global atrophy.  Ventricular prominence probably related to atrophy although difficult to  completely exclude a mild component hydrocephalus.  Appearance without significant change.  Prominent small vessel disease type changes.  MRA HEAD  Findings: Fetal type origin of the posterior cerebral arteries.  Hypoplastic A1 segment left anterior cerebral artery.  Mild narrowing M1 segment middle cerebral artery bilaterally. Moderate to marked narrowing of branches of the middle cerebral artery bilaterally greater on the right.  Small vertebral arteries and basilar artery bilaterally.  Narrowing of portions of the distal vertebral arteries and proximal basilar artery.  PICAs and AICAs not visualized.  Attenuated superior cerebellar artery bilaterally.  Narrowed distal posterior cerebral artery branches more notable on the left.  No aneurysm noted.  Previously questioned left PCOM aneurysm appears to be posterior communicating artery itself.  IMPRESSION: Intracranial atherosclerotic type changes as noted above.   Original Report Authenticated By: Lacy Duverney, M.D.   Mr Mra Head/brain Wo Cm  09/10/2012   *RADIOLOGY REPORT*  Clinical Data:  Numerous falls. Dementia.  Hypertension and hyperlipidemia.  Colon cancer.  MRI BRAIN WITHOUT CONTRAST MRA HEAD WITHOUT CONTRAST  Technique: Multiplanar, multiecho pulse sequences of the brain and surrounding structures were obtained according to standard protocol without intravenous contrast.  Angiographic images of the head were obtained using MRA technique without contrast.  Comparison: 09/09/2012 CT.  12/24/2011 MR.  MRI HEAD  Findings:  Motion degraded exam.  No acute infarct.  Right frontal/supraorbital subcutaneous hematoma.  No intracranial hemorrhage.  Moderate global atrophy.  Ventricular prominence probably related to atrophy although difficult to completely exclude a mild component hydrocephalus.  Appearance without significant change.  Prominent small vessel disease type changes.  No intracranial mass lesion detected on this unenhanced exam.  Cervical spondylotic  changes with spinal stenosis of the upper cervical spine.  Minimal paranasal sinus mucosal thickening.  IMPRESSION: No acute infarct.  Right frontal/supraorbital subcutaneous hematoma.  No intracranial hemorrhage.  Moderate global atrophy.  Ventricular prominence probably related to atrophy although difficult to completely exclude a mild component hydrocephalus.  Appearance without significant change.  Prominent small vessel disease type changes.  MRA HEAD  Findings: Fetal type origin of the posterior cerebral arteries.  Hypoplastic A1 segment left anterior cerebral artery.  Mild narrowing M1 segment middle cerebral artery bilaterally. Moderate to marked narrowing of branches of the middle cerebral artery bilaterally greater on the right.  Small vertebral arteries and  basilar artery bilaterally.  Narrowing of portions of the distal vertebral arteries and proximal basilar artery.  PICAs and AICAs not visualized.  Attenuated superior cerebellar artery bilaterally.  Narrowed distal posterior cerebral artery branches more notable on the left.  No aneurysm noted.  Previously questioned left PCOM aneurysm appears to be posterior communicating artery itself.  IMPRESSION: Intracranial atherosclerotic type changes as noted above.   Original Report Authenticated By: Lacy Duverney, M.D.   Ct Maxillofacial Wo Cm  09/09/2012   CLINICAL DATA:  Numerous falls bruising, swelling of her right cheek, orbits, forehead. No loss of consciousness.  EXAM: CT HEAD WITHOUT CONTRAST  CT MAXILLOFACIAL WITHOUT CONTRAST  CT CERVICAL SPINE WITHOUT CONTRAST  TECHNIQUE: Multidetector CT imaging of the head, cervical spine, and maxillofacial structures were performed using the standard protocol without intravenous contrast. Multiplanar CT image reconstructions of the cervical spine and maxillofacial structures were also generated.  COMPARISON:  01/10/2012  FINDINGS: CT HEAD FINDINGS  There is atrophy and chronic small vessel disease changes. No  acute intracranial abnormality. Specifically, no hemorrhage, hydrocephalus, mass lesion, acute infarction, or significant intracranial injury. No acute calvarial abnormality. Mild soft tissue swelling over the right forehead.  CT MAXILLOFACIAL FINDINGS  No evidence of facial fracture. Paranasal sinuses are clear. Orbital soft tissues unremarkable.  CT CERVICAL SPINE FINDINGS  Advanced degenerative changes throughout cervical spine, most degenerative disc and facet disease. Normal alignment. Prevertebral soft tissues are normal. No fracture. No epidural or paraspinal hematoma. Soft tissue calcifications within the posterior soft tissues, likely related to old injury.  IMPRESSION: CT HEAD IMPRESSION  No acute intracranial abnormality.  Atrophy, chronic microvascular disease.  CT MAXILLOFACIAL IMPRESSION  No evidence of facial fracture.  CT CERVICAL SPINE IMPRESSION  No acute bony abnormality.   Electronically Signed   By: Charlett Nose   On: 09/09/2012 11:05    Microbiology: Recent Results (from the past 240 hour(s))  MRSA PCR SCREENING     Status: None   Collection Time    09/09/12  7:17 PM      Result Value Range Status   MRSA by PCR NEGATIVE  NEGATIVE Final   Comment:            The GeneXpert MRSA Assay (FDA     approved for NASAL specimens     only), is one component of a     comprehensive MRSA colonization     surveillance program. It is not     intended to diagnose MRSA     infection nor to guide or     monitor treatment for     MRSA infections.     Labs: Basic Metabolic Panel:  Recent Labs Lab 09/09/12 0954 09/09/12 1515 09/10/12 0215  NA 141  --  141  K 3.7  --  3.8  CL 103  --  105  CO2 30  --  25  GLUCOSE 77  --  78  BUN 20  --  25*  CREATININE 0.73 0.66 0.75  CALCIUM 9.7  --  9.3   Liver Function Tests: No results found for this basename: AST, ALT, ALKPHOS, BILITOT, PROT, ALBUMIN,  in the last 168 hours No results found for this basename: LIPASE, AMYLASE,  in the last  168 hours No results found for this basename: AMMONIA,  in the last 168 hours CBC:  Recent Labs Lab 09/09/12 0954 09/09/12 1515 09/10/12 0215  WBC 8.0 7.1 7.5  NEUTROABS 4.8  --   --   HGB 11.3*  11.1* 10.8*  HCT 34.3* 32.9* 31.8*  MCV 89.1 88.7 88.6  PLT 196 199 197   Cardiac Enzymes:  Recent Labs Lab 09/09/12 0954 09/09/12 1516 09/09/12 2011 09/10/12 0215  TROPONINI <0.30 <0.30 <0.30 <0.30   BNP: BNP (last 3 results) No results found for this basename: PROBNP,  in the last 8760 hours CBG: No results found for this basename: GLUCAP,  in the last 168 hours     Signed:  Marlin Canary  [facility] 09/12/2012, 9:28 AM

## 2012-09-12 NOTE — Assessment & Plan Note (Signed)
Continue cymbalta for same

## 2012-09-12 NOTE — Assessment & Plan Note (Signed)
Felt to be due to sedating meds;w/u ruled out broken bones or new CVA

## 2012-09-12 NOTE — Assessment & Plan Note (Signed)
No CP or SOB;Cardiac enzymes were neg

## 2012-09-12 NOTE — Assessment & Plan Note (Signed)
On no meds and lipid panel 8/20 was very acceptable

## 2012-09-12 NOTE — Progress Notes (Signed)
Agree with below note. Gabriela Giannelli Helen Alazae Crymes PT, DPT Pager: [phone]  

## 2012-09-12 NOTE — Assessment & Plan Note (Addendum)
Stable-continue present meds;diuretics were stopped in hospital -will restart if necessary

## 2012-09-15 ENCOUNTER — Other Ambulatory Visit: Payer: Self-pay

## 2012-09-15 MED ORDER — LORAZEPAM 0.5 MG PO TABS: 0.2500 mg | ORAL_TABLET | Freq: Two times a day (BID) | ORAL | Status: DC | PRN

## 2012-09-17 ENCOUNTER — Encounter (HOSPITAL_COMMUNITY): Payer: Self-pay | Admitting: Emergency Medicine

## 2012-09-17 ENCOUNTER — Emergency Department (HOSPITAL_COMMUNITY): Payer: Medicare Other

## 2012-09-17 ENCOUNTER — Inpatient Hospital Stay (HOSPITAL_COMMUNITY)
Admission: EM | Admit: 2012-09-17 | Discharge: 2012-09-18 | DRG: 072 | Disposition: A | Discharge status: Skilled Nursing Facility | Payer: Medicare Other | Attending: Internal Medicine | Admitting: Internal Medicine

## 2012-09-17 DIAGNOSIS — I498 Other specified cardiac arrhythmias: Secondary | ICD-10-CM | POA: Diagnosis present

## 2012-09-17 DIAGNOSIS — Z9181 History of falling: Secondary | ICD-10-CM

## 2012-09-17 DIAGNOSIS — F0281 Dementia in other diseases classified elsewhere with behavioral disturbance: Secondary | ICD-10-CM | POA: Diagnosis present

## 2012-09-17 DIAGNOSIS — R296 Repeated falls: Secondary | ICD-10-CM

## 2012-09-17 DIAGNOSIS — R4182 Altered mental status, unspecified: Secondary | ICD-10-CM

## 2012-09-17 DIAGNOSIS — F0390 Unspecified dementia without behavioral disturbance: Secondary | ICD-10-CM

## 2012-09-17 DIAGNOSIS — I2510 Atherosclerotic heart disease of native coronary artery without angina pectoris: Secondary | ICD-10-CM | POA: Diagnosis present

## 2012-09-17 DIAGNOSIS — E860 Dehydration: Secondary | ICD-10-CM

## 2012-09-17 DIAGNOSIS — Z79899 Other long term (current) drug therapy: Secondary | ICD-10-CM

## 2012-09-17 DIAGNOSIS — D649 Anemia, unspecified: Secondary | ICD-10-CM | POA: Diagnosis present

## 2012-09-17 DIAGNOSIS — R001 Bradycardia, unspecified: Secondary | ICD-10-CM | POA: Diagnosis present

## 2012-09-17 DIAGNOSIS — R031 Nonspecific low blood-pressure reading: Secondary | ICD-10-CM | POA: Diagnosis present

## 2012-09-17 DIAGNOSIS — Z66 Do not resuscitate: Secondary | ICD-10-CM | POA: Diagnosis present

## 2012-09-17 DIAGNOSIS — I739 Peripheral vascular disease, unspecified: Secondary | ICD-10-CM | POA: Diagnosis present

## 2012-09-17 DIAGNOSIS — M810 Age-related osteoporosis without current pathological fracture: Secondary | ICD-10-CM | POA: Diagnosis present

## 2012-09-17 DIAGNOSIS — I959 Hypotension, unspecified: Secondary | ICD-10-CM | POA: Diagnosis present

## 2012-09-17 DIAGNOSIS — F3289 Other specified depressive episodes: Secondary | ICD-10-CM | POA: Diagnosis present

## 2012-09-17 DIAGNOSIS — F0280 Dementia in other diseases classified elsewhere without behavioral disturbance: Secondary | ICD-10-CM | POA: Diagnosis present

## 2012-09-17 DIAGNOSIS — K219 Gastro-esophageal reflux disease without esophagitis: Secondary | ICD-10-CM | POA: Diagnosis present

## 2012-09-17 DIAGNOSIS — I10 Essential (primary) hypertension: Secondary | ICD-10-CM | POA: Diagnosis present

## 2012-09-17 DIAGNOSIS — E785 Hyperlipidemia, unspecified: Secondary | ICD-10-CM | POA: Diagnosis present

## 2012-09-17 DIAGNOSIS — R531 Weakness: Secondary | ICD-10-CM

## 2012-09-17 DIAGNOSIS — E538 Deficiency of other specified B group vitamins: Secondary | ICD-10-CM | POA: Diagnosis present

## 2012-09-17 DIAGNOSIS — Z85038 Personal history of other malignant neoplasm of large intestine: Secondary | ICD-10-CM

## 2012-09-17 DIAGNOSIS — F329 Major depressive disorder, single episode, unspecified: Secondary | ICD-10-CM | POA: Diagnosis present

## 2012-09-17 DIAGNOSIS — G9341 Metabolic encephalopathy: Principal | ICD-10-CM | POA: Diagnosis present

## 2012-09-17 DIAGNOSIS — G309 Alzheimer's disease, unspecified: Secondary | ICD-10-CM | POA: Diagnosis present

## 2012-09-17 LAB — HEPATIC FUNCTION PANEL
ALT: 13 U/L (ref 0–35)
Albumin: 3 g/dL — ABNORMAL LOW (ref 3.5–5.2)
Total Protein: 6.5 g/dL (ref 6.0–8.3)

## 2012-09-17 LAB — BASIC METABOLIC PANEL
BUN: 27 mg/dL — ABNORMAL HIGH (ref 6–23)
CO2: 27 mEq/L (ref 19–32)
Calcium: 8.7 mg/dL (ref 8.4–10.5)
Creatinine, Ser: 0.81 mg/dL (ref 0.50–1.10)
GFR calc non Af Amer: 63 mL/min — ABNORMAL LOW (ref >90)
Glucose, Bld: 85 mg/dL (ref 70–99)

## 2012-09-17 LAB — URINALYSIS, ROUTINE W REFLEX MICROSCOPIC
Bilirubin Urine: NEGATIVE
Ketones, ur: NEGATIVE mg/dL
Nitrite: NEGATIVE
Protein, ur: NEGATIVE mg/dL
pH: 7 (ref 5.0–8.0)

## 2012-09-17 LAB — CBC
MCH: 29.6 pg (ref 26.0–34.0)
MCHC: 32.8 g/dL (ref 30.0–36.0)
MCV: 90.2 fL (ref 78.0–100.0)
Platelets: 164 10*3/uL (ref 150–400)
RBC: 3.38 MIL/uL — ABNORMAL LOW (ref 3.87–5.11)
RDW: 13.6 % (ref 11.5–15.5)

## 2012-09-17 LAB — GLUCOSE, CAPILLARY

## 2012-09-17 LAB — VALPROIC ACID LEVEL: Valproic Acid Lvl: 22.4 ug/mL — ABNORMAL LOW (ref 50.0–100.0)

## 2012-09-17 LAB — AMMONIA: Ammonia: 60 umol/L (ref 11–60)

## 2012-09-17 MED ORDER — ONDANSETRON HCL 4 MG/2ML IJ SOLN: 4.0000 mg | Freq: Four times a day (QID) | INTRAMUSCULAR | Status: DC | PRN

## 2012-09-17 MED ORDER — ENOXAPARIN SODIUM 40 MG/0.4ML ~~LOC~~ SOLN
40.0000 mg | SUBCUTANEOUS | Status: DC
  Administered 2012-09-17: 40 mg via SUBCUTANEOUS
  Filled 2012-09-17 (×2): qty 0.4

## 2012-09-17 MED ORDER — ALBUTEROL SULFATE (5 MG/ML) 0.5% IN NEBU: 2.5000 mg | INHALATION_SOLUTION | RESPIRATORY_TRACT | Status: DC | PRN

## 2012-09-17 MED ORDER — DIVALPROEX SODIUM 125 MG PO CPSP
250.0000 mg | ORAL_CAPSULE | Freq: Two times a day (BID) | ORAL | Status: DC
  Administered 2012-09-18: 250 mg via ORAL
  Filled 2012-09-17 (×3): qty 2

## 2012-09-17 MED ORDER — ONDANSETRON HCL 4 MG PO TABS: 4.0000 mg | ORAL_TABLET | Freq: Four times a day (QID) | ORAL | Status: DC | PRN

## 2012-09-17 MED ORDER — ACETAMINOPHEN 650 MG RE SUPP: 650.0000 mg | Freq: Four times a day (QID) | RECTAL | Status: DC | PRN

## 2012-09-17 MED ORDER — SODIUM CHLORIDE 0.9 % IJ SOLN
3.0000 mL | Freq: Two times a day (BID) | INTRAMUSCULAR | Status: DC
  Administered 2012-09-18: 3 mL via INTRAVENOUS

## 2012-09-17 MED ORDER — ACETAMINOPHEN 325 MG PO TABS: 650.0000 mg | ORAL_TABLET | Freq: Four times a day (QID) | ORAL | Status: DC | PRN

## 2012-09-17 MED ORDER — SODIUM CHLORIDE 0.9 % IV SOLN
INTRAVENOUS | Status: DC
  Administered 2012-09-17: 23:00:00 via INTRAVENOUS

## 2012-09-17 MED ORDER — ASPIRIN 300 MG RE SUPP
300.0000 mg | Freq: Every day | RECTAL | Status: DC
  Administered 2012-09-17: 300 mg via RECTAL
  Filled 2012-09-17 (×2): qty 1

## 2012-09-17 NOTE — ED Provider Notes (Signed)
CSN: [id number]     Arrival date & time 09/17/12  1427 History   First MD Initiated Contact with Patient 09/17/12 1500     Chief Complaint  Patient presents with  . Altered Mental Status   (Consider location/radiation/quality/duration/timing/severity/associated sxs/prior Treatment) The history is provided by the nursing home and the EMS personnel. The history is limited by the condition of the patient.   79 year old female with history of Alzheimer's dementia, depression, CAD, PVD, HTN, dyslipidemia, osteoporosis, colon cancer s/p right hemicolectomy, cataracts, s/p cataract extraction, s/p cholecystectomy, recent admission 8/19-8/22 for generalized weakness and falls with MR Brain without acute infarcts presents with altered mental status from [facility]  Per [facility], patient was in her normal state of health yesterday, walking around the facility and conversating with staff, however today patient appeared very sleepy, difficult to wake up, and was noted to have low blood pressures at the facility.  Per facility for blood pressures were as low as 56/20 with a heart rate of 52, however on EMS arrival systolic blood pressures were in the 90s. Facility reports patient has had no medication changes, and that she is continuing with Ativan although is recommended that she switch to Haldol during her last inpatient admission. Facility denies any cough, nausea, vomiting, diarrhea, patient concerns prior to being altered this AM. Patient is unable to answer questions at this time due to altered mental status.  Given narcan with EMS however no hx of opiate use.   Past Medical History  Diagnosis Date  . Diverticulosis of colon (without mention of hemorrhage) 06/24/2006  . Carcinoma of cecum 1992    History of  . Vitamin B12 deficiency   . Alzheimer's disease   . HTN (hypertension)   . Depression   . Osteoporosis, unspecified   . PVD (peripheral vascular disease)   .  CAD (coronary artery disease)   . GERD (gastroesophageal reflux disease)   . H. pylori infection 2004  . Presbyesophagus   . Iron deficiency anemia, unspecified 01/02/2012  . Personal history of fall 01/04/2009  . Unspecified constipation   . Hemorrhage of rectum and anus   . Chest pain, unspecified   . Acute pharyngitis   . Malignant neoplasm of colon, unspecified site   . Edema   . Cataract   . HLD (hyperlipidemia)    Past Surgical History  Procedure Laterality Date  . Gallbladder surgery    . Abdominal hysterectomy  1995    with bso  . Right colectomy    . Cataract extraction  2001  . Cholecystectomy Right    Family History  Problem Relation Age of Onset  . Heart disease Mother   . Colon cancer Neg Hx    History  Substance Use Topics  . Smoking status: Former Smoker    Types: Cigarettes  . Smokeless tobacco: Never Used  . Alcohol Use: No   OB History   Grav Para Term Preterm Abortions TAB SAB Ect Mult Living                 Review of Systems  Unable to perform ROS: Mental status change    Allergies  Captopril-hydrochlorothiazide; Niacin and related; Urispas; and Verapamil  Home Medications   Current Outpatient Rx  Name  Route  Sig  Dispense  Refill  . acetaminophen (TYLENOL) 650 MG CR tablet   Oral   Take 650 mg by mouth 3 (three) times daily.         Marland Kitchen  alendronate (FOSAMAX) 70 MG tablet   Oral   Take 70 mg by mouth every 7 (seven) days. Takes on Mondays. Take with a full glass of water on an empty stomach.         Marland Kitchen amLODipine (NORVASC) 10 MG tablet   Oral   Take 10 mg by mouth daily.         Marland Kitchen aspirin EC 81 MG tablet   Oral   Take 81 mg by mouth daily.         Marland Kitchen atorvastatin (LIPITOR) 10 MG tablet   Oral   Take 5 mg by mouth daily.         . Calcium Carbonate-Vitamin D (CALCIUM-VITAMIN D) 500-200 MG-UNIT per tablet   Oral   Take 1 tablet by mouth daily.         . cholecalciferol (VITAMIN D) 400 UNITS TABS tablet   Oral    Take 400 Units by mouth daily.         . divalproex (DEPAKOTE SPRINKLE) 125 MG capsule   Oral   Take 250 mg by mouth 2 (two) times daily.         . DULoxetine (CYMBALTA) 60 MG capsule   Oral   Take 60 mg by mouth daily.         . ferrous sulfate 325 (65 FE) MG tablet   Oral   Take 325 mg by mouth daily with breakfast.         . lisinopril (PRINIVIL,ZESTRIL) 10 MG tablet   Oral   Take 10 mg by mouth daily.         Marland Kitchen LORazepam (ATIVAN) 0.5 MG tablet   Oral   Take 0.5 mg by mouth 2 (two) times daily.         . metoprolol (LOPRESSOR) 50 MG tablet   Oral   Take 50 mg by mouth 2 (two) times daily.         . Multiple Vitamin (MULTIVITAMIN WITH MINERALS) TABS tablet   Oral   Take 1 tablet by mouth daily.         . sennosides-docusate sodium (SENOKOT-S) 8.6-50 MG tablet   Oral   Take 1 tablet by mouth daily.         . sucralfate (CARAFATE) 1 GM/10ML suspension   Oral   Take 1 g by mouth 4 (four) times daily -  before meals and at bedtime.         . vitamin B-12 (CYANOCOBALAMIN) 1000 MCG tablet   Oral   Take 1,000 mcg by mouth every other day.          BP 111/33  Pulse 48  Temp(Src) 96.4 F (35.8 C) (Rectal)  Resp 12  SpO2 98% Physical Exam  Nursing note and vitals reviewed. Constitutional: She appears well-developed and well-nourished. She appears lethargic. She has a sickly appearance. She appears ill. No distress. Nasal cannula in place.  HENT:  Head: Normocephalic. Head is with contusion (healing right orbit).  Eyes: Conjunctivae and EOM are normal.  Neck: Normal range of motion.  Cardiovascular: Normal rate, regular rhythm, normal heart sounds and intact distal pulses.  Exam reveals no gallop and no friction rub.   No murmur heard. Pulmonary/Chest: Effort normal and breath sounds normal. No respiratory distress. She has no wheezes. She has no rales.  Abdominal: Soft. She exhibits no distension. There is no tenderness. There is no guarding.   Musculoskeletal: She exhibits no edema and no tenderness.  Neurological:  She appears lethargic. GCS eye subscore is 3. GCS verbal subscore is 2. GCS motor subscore is 6.  Gross strength equal bilateral extremities  Skin: Skin is warm and dry. No rash noted. She is not diaphoretic. No erythema.    ED Course  Procedures (including critical care time) Labs Review Labs Reviewed  BASIC METABOLIC PANEL - Abnormal; Notable for the following:    BUN 27 (*)    GFR calc non Af Amer 63 (*)    GFR calc Af Amer 73 (*)    All other components within normal limits  CBC - Abnormal; Notable for the following:    RBC 3.38 (*)    Hemoglobin 10.0 (*)    HCT 30.5 (*)    All other components within normal limits  HEPATIC FUNCTION PANEL - Abnormal; Notable for the following:    Albumin 3.0 (*)    All other components within normal limits  VALPROIC ACID LEVEL - Abnormal; Notable for the following:    Valproic Acid Lvl 22.4 (*)    All other components within normal limits  URINALYSIS, ROUTINE W REFLEX MICROSCOPIC  GLUCOSE, CAPILLARY  LACTIC ACID, PLASMA  TROPONIN I  AMMONIA  LIPASE, BLOOD   Imaging Review Dg Chest 2 View  09/17/2012   CLINICAL DATA:  Altered mental status.  EXAM: CHEST  2 VIEW  COMPARISON:  09/09/2012  FINDINGS: Low lung volumes. Bibasilar opacities compatible with atelectasis. Heart is mildly enlarged. No visible effusions. No acute bony abnormality.  IMPRESSION: Low lung volumes, bibasilar atelectasis.   Electronically Signed   By: Charlett Nose   On: 09/17/2012 15:18   Ct Head Wo Contrast  09/17/2012   *RADIOLOGY REPORT*  Clinical Data: Altered mental status, history dementia  CT HEAD WITHOUT CONTRAST  Technique:  Contiguous axial images were obtained from the base of the skull through the vertex without contrast.  Comparison: 09/09/2012; brain MRI - 09/10/2012  Findings:  There is residual minimal subcutaneous stranding about the superior lateral aspect of the right orbit (image  20, series 3) improved since recent prior examination. No displaced calvarial fracture.  Redemonstrated diffuse atrophy with sulcal prominence of centralized volume loss with commiserate ex vacuo dilatation of the ventricular system.  Rather extensive periventricular hypodensities appear grossly unchanged. Stable sequela of prior small infarct within the left cerebellum (image 6, series three).  Given extensive background parenchymal abnormalities, there is no CT evidence of acute large territory infarct.  No intraparenchymal or extra-axial mass or hemorrhage.  Unchanged size and configuration of the ventricles and basilar cisterns.  No midline shift.  Limited visualization of paranasal sinuses and mastoid air cells are normal.  Post bilateral cataract surgery.  IMPRESSION: 1.  Stable findings of atrophy and microvascular ischemic disease without acute intracranial process. 2.  Improved soft tissue swelling about the superior lateral aspect the right orbit.   Original Report Authenticated By: Tacey Ruiz, MD    Date: 09/17/2012  Rate: 47  Rhythm: sinus bradycardia  QRS Axis: left  Intervals: normal  ST/T Wave abnormalities: normal  Conduction Disutrbances:none  Narrative Interpretation:   Old EKG Reviewed: unchanged   MDM   1. Altered mental status   2. Generalized weakness    79 year old female with history of Alzheimer's dementia, depression, CAD, PVD, HTN, dyslipidemia, osteoporosis, colon cancer s/p right hemicolectomy, cataracts, s/p cataract extraction, s/p cholecystectomy, recent admission 8/19-8/22 for generalized weakness and falls with MR Brain without acute infarcts presents with altered mental status from [facility].  Differential diagnosis includes intracranial bleed, electrolyte abnormality, medication effect, cardiac dysrhythmia/ischemia, infection.  EKG evaluated by me and shows sinus bradycardia, similar to prior.  Patient is taking metoprolol 50mg  bid. Chest x-ray  shows no signs of pneumonia.   Urinalysis shows no signs of urinary tract infection. CT head shows stable findings of atrophy and microvascular ischemic disease without any intracranial bleed.  Patient's hemoglobin is 10 which is stable from prior.  Lactic acid was within normal limits. Ammonia was within normal limits. Nose negative. Depakote level was checked and was low.  Electrolytes were within normal limits.  Have low suspicion for meningitis or encephalitis.   Other etiologies for patient's garbled speech and altered mental status include stroke.  Patient will be admitted to the hospitalist service for further workup regarding her altered mental status.     Rhae Lerner, MD 09/18/12 0201

## 2012-09-17 NOTE — ED Notes (Signed)
CBG 76 

## 2012-09-17 NOTE — H&P (Addendum)
PATIENT DETAILS Name: [name] Age: 79 y.o. Sex: female Date of Birth: [birth year] Admit Date: 09/17/2012 ZOX:[id number], Sherilyn Cooter, MD   CHIEF COMPLAINT:  Altered mental status  HPI: [name] is a 79 y.o. female with a Past Medical History of advanced dementia, recurrent falls, CAD hypertension who presents today with the above noted complaint. Please note that history is essentially not obtainable from the patient. Most of this history is obtained from EDP notes. Patient had a recent admission for generalized weakness and recurrent falls and was discharged back to a skilled nursing facility. Apparently while at the skilled nursing facility she was in her usual state of health until this morning, when she was noted to be more lethargic and much more altered than her usual baseline. She was noted to have hypotension and bradycardia while at the facility as well. She was then brought to the emergency room, by EMS we'll also give her Narcan without any improvement. In the emergency room, a CT of the head did not show any acute abnormalities, she was no longer hypotensive here in the emergency room, UA did not demonstrate UTI, chest x-ray did not demonstrate pneumonia. I was subsequently asked to admit this patient for further evaluation and treatment. From what I can obtain from the nursing staff, there is no history of fever, nausea, vomiting or diarrhea. Is history of cough as well.   ALLERGIES:   Allergies  Allergen Reactions  . Captopril-Hydrochlorothiazide Other (See Comments)    Reaction unknown  . Niacin And Related Other (See Comments)    Reaction unknown  . Urispas [Flavoxate] Other (See Comments)    Reaction unknown  . Verapamil Other (See Comments)    Reaction unknown    PAST MEDICAL HISTORY: Past Medical History  Diagnosis Date  . Diverticulosis of colon (without mention of hemorrhage) 06/24/2006  . Carcinoma of cecum 1992    History of  . Vitamin B12 deficiency   .  Alzheimer's disease   . HTN (hypertension)   . Depression   . Osteoporosis, unspecified   . PVD (peripheral vascular disease)   . CAD (coronary artery disease)   . GERD (gastroesophageal reflux disease)   . H. pylori infection 2004  . Presbyesophagus   . Iron deficiency anemia, unspecified 01/02/2012  . Personal history of fall 01/04/2009  . Unspecified constipation   . Hemorrhage of rectum and anus   . Chest pain, unspecified   . Acute pharyngitis   . Malignant neoplasm of colon, unspecified site   . Edema   . Cataract   . HLD (hyperlipidemia)     PAST SURGICAL HISTORY: Past Surgical History  Procedure Laterality Date  . Gallbladder surgery    . Abdominal hysterectomy  1995    with bso  . Right colectomy    . Cataract extraction  2001  . Cholecystectomy Right     MEDICATIONS AT HOME: Prior to Admission medications   Medication Sig Start Date End Date Taking? Authorizing Provider  acetaminophen (TYLENOL) 650 MG CR tablet Take 650 mg by mouth 3 (three) times daily.   Yes Historical Provider, MD  alendronate (FOSAMAX) 70 MG tablet Take 70 mg by mouth every 7 (seven) days. Takes on Mondays. Take with a full glass of water on an empty stomach.   Yes Historical Provider, MD  amLODipine (NORVASC) 10 MG tablet Take 10 mg by mouth daily.   Yes Historical Provider, MD  aspirin EC 81 MG tablet Take 81 mg by mouth daily.  Yes Historical Provider, MD  atorvastatin (LIPITOR) 10 MG tablet Take 5 mg by mouth daily.   Yes Historical Provider, MD  Calcium Carbonate-Vitamin D (CALCIUM-VITAMIN D) 500-200 MG-UNIT per tablet Take 1 tablet by mouth daily.   Yes Historical Provider, MD  cholecalciferol (VITAMIN D) 400 UNITS TABS tablet Take 400 Units by mouth daily.   Yes Historical Provider, MD  divalproex (DEPAKOTE SPRINKLE) 125 MG capsule Take 250 mg by mouth 2 (two) times daily.   Yes Historical Provider, MD  DULoxetine (CYMBALTA) 60 MG capsule Take 60 mg by mouth daily.   Yes Historical  Provider, MD  ferrous sulfate 325 (65 FE) MG tablet Take 325 mg by mouth daily with breakfast.   Yes Historical Provider, MD  lisinopril (PRINIVIL,ZESTRIL) 10 MG tablet Take 10 mg by mouth daily.   Yes Historical Provider, MD  LORazepam (ATIVAN) 0.5 MG tablet Take 0.5 mg by mouth 2 (two) times daily.   Yes Historical Provider, MD  metoprolol (LOPRESSOR) 50 MG tablet Take 50 mg by mouth 2 (two) times daily.   Yes Historical Provider, MD  Multiple Vitamin (MULTIVITAMIN WITH MINERALS) TABS tablet Take 1 tablet by mouth daily.   Yes Historical Provider, MD  sennosides-docusate sodium (SENOKOT-S) 8.6-50 MG tablet Take 1 tablet by mouth daily.   Yes Historical Provider, MD  sucralfate (CARAFATE) 1 GM/10ML suspension Take 1 g by mouth 4 (four) times daily -  before meals and at bedtime.   Yes Historical Provider, MD  vitamin B-12 (CYANOCOBALAMIN) 1000 MCG tablet Take 1,000 mcg by mouth every other day.   Yes Historical Provider, MD    FAMILY HISTORY: Family History  Problem Relation Age of Onset  . Heart disease Mother   . Colon cancer Neg Hx     SOCIAL HISTORY:  reports that she has quit smoking. Her smoking use included Cigarettes. She smoked 0.00 packs per day. She has never used smokeless tobacco. She reports that she does not drink alcohol or use illicit drugs.  REVIEW OF SYSTEMS: Difficult in this circumstance, however negative for the following Constitutional:   No  weight loss, night sweats,  Fevers.  HEENT:    No headaches, No sneezing, itching, ear ache, nasal congestion  Cardio-vascular: No chest pain,  Orthopnea, PND, swelling in lower extremities  GI:  No abdominal pain, nausea, vomiting, diarrhea.  Resp: No shortness of breath with exertion or at rest.   Skin:  no rash or lesions.  GU:    No flank pain.  Musculoskeletal: No joint pain or swelling.  No decreased range of motion.  No back pain.  PHYSICAL EXAM: Blood pressure 111/33, pulse 48, temperature 96.4 F  (35.8 C), temperature source Rectal, resp. rate 12, SpO2 98.00%.  General appearance: Lethargic and mumbling incoherently, easily arousable with focal and painful stimuli.. Not toxic Looking HEENT: Atraumatic and Normocephalic, pupils equally reactive to light and accomodation Neck: supple, no JVD. No cervical lymphadenopathy.  Chest:Good air entry bilaterally, no added sounds  CVS: S1 S2 regular, no murmurs.  Abdomen: Bowel sounds present, Non tender and not distended with no gaurding, rigidity or rebound. Extremities: B/L Lower Ext shows no edema, both legs are warm to touch Neurology: Moves all 4 extremities-to painful stimuli-very difficult exam unable to evaluate further Skin:No Rash Wounds:N/A  LABS ON ADMISSION:   Recent Labs  09/17/12 1450  NA 142  K 4.0  CL 108  CO2 27  GLUCOSE 85  BUN 27*  CREATININE 0.81  CALCIUM 8.7  Recent Labs  09/17/12 1601  AST 27  ALT 13  ALKPHOS 56  BILITOT 0.4  PROT 6.5  ALBUMIN 3.0*    Recent Labs  09/17/12 1601  LIPASE 23    Recent Labs  09/17/12 1450  WBC 5.7  HGB 10.0*  HCT 30.5*  MCV 90.2  PLT 164    Recent Labs  09/17/12 1601  TROPONINI <0.30   No results found for this basename: DDIMER,  in the last 72 hours No components found with this basename: POCBNP,    RADIOLOGIC STUDIES ON ADMISSION: Dg Chest 2 View  09/17/2012   CLINICAL DATA:  Altered mental status.  EXAM: CHEST  2 VIEW  COMPARISON:  09/09/2012  FINDINGS: Low lung volumes. Bibasilar opacities compatible with atelectasis. Heart is mildly enlarged. No visible effusions. No acute bony abnormality.  IMPRESSION: Low lung volumes, bibasilar atelectasis.   Electronically Signed   By: Charlett Nose   On: 09/17/2012 15:18   Ct Head Wo Contrast  09/17/2012   *RADIOLOGY REPORT*  Clinical Data: Altered mental status, history dementia  CT HEAD WITHOUT CONTRAST  Technique:  Contiguous axial images were obtained from the base of the skull through the vertex  without contrast.  Comparison: 09/09/2012; brain MRI - 09/10/2012  Findings:  There is residual minimal subcutaneous stranding about the superior lateral aspect of the right orbit (image 20, series 3) improved since recent prior examination. No displaced calvarial fracture.  Redemonstrated diffuse atrophy with sulcal prominence of centralized volume loss with commiserate ex vacuo dilatation of the ventricular system.  Rather extensive periventricular hypodensities appear grossly unchanged. Stable sequela of prior small infarct within the left cerebellum (image 6, series three).  Given extensive background parenchymal abnormalities, there is no CT evidence of acute large territory infarct.  No intraparenchymal or extra-axial mass or hemorrhage.  Unchanged size and configuration of the ventricles and basilar cisterns.  No midline shift.  Limited visualization of paranasal sinuses and mastoid air cells are normal.  Post bilateral cataract surgery.  IMPRESSION: 1.  Stable findings of atrophy and microvascular ischemic disease without acute intracranial process. 2.  Improved soft tissue swelling about the superior lateral aspect the right orbit.   Original Report Authenticated By: Tacey Ruiz, MD     EKG: Independently reviewed. Sinus bradycardia  ASSESSMENT AND PLAN: Present on Admission:  . Altered mental status - Secondary to acute encephalopathy- of unknown etiology-? Toxic encephalopathy-on benzodiazepines, Depakote and Cymbalta. She seems to be more responsive than what she was on admission, hopefully, with further hydration she was slowly continue to improve. - Given lack of fever, supple neck-at this time doubt any intracranial infection as well. Seems to be moving all 4 extremities to pain, very difficult new exam-but at this time doubt acute CVA as well. However will place on aspirin.  - Given transient hypotension in the facility, question of infection/dehydration does appear possible-however at  this time no foci is apparent, for now we will just hydrate, hold all her medications including the recently started benzodiazepines, and see how she does.Will not start any antibiotics at present. If she were to continue in this current state, we will pursue a MRI of the brain and EEG tomorrow morning. She may need neurology consultation if her mental status persists.  .Transient hypotension -? Etiology - This has resolved - Gently hydrate - Afebrile, no leukocytosis, lactate also within normal limits. Initial troponins also negative - Monitor in telemetry and follow clinical course.  .Sinus bradycardia - On  Aricept and metoprolol-I doubt at this point, this caused her hypotension. - Recent TSH was within normal limits - Monitor off the above medications and see if her rate improved  . CAD (coronary artery disease) - Stable for now,resume ASA for now  . Dehydration - Hydrated with IV fluids and reassess in the morning   . Dementia - Apparently has advanced dementia at baseline, CT head negative.  - Not sure if her current mental status represents simple delirium, and just progression/part of her usual dementia   . HTN (hypertension) - Given transient hypotension, hold all antihypertensive medications for now   . Anemia - Show chronic anemia, vitamin B12 deficiency-monitor hemoglobin and hematocrit on inpatient  Further plan will depend as patient's clinical course evolves and further radiologic and laboratory data become available. Patient will be monitored closely.   DVT Prophylaxis: Prophylactic Lovenox  Code Status: DNR  Family communication: Left message for some Mr. Howard change at [phone]  Total time spent for admission equals 45 minutes.  [facility] Pager [phone]  If 7PM-7AM, please contact night-coverage [email or web address] Password [email or web address] 09/17/2012, 6:40 PM

## 2012-09-17 NOTE — ED Notes (Signed)
Pt brought to ED by EMS with altered mental status.Pt lives in [facility] and as per staff pt was not responding and BP was 88/50 after lunch.EMS gave narcan but no response.

## 2012-09-18 ENCOUNTER — Ambulatory Visit (HOSPITAL_COMMUNITY): Payer: Medicare Other

## 2012-09-18 DIAGNOSIS — R001 Bradycardia, unspecified: Secondary | ICD-10-CM | POA: Diagnosis present

## 2012-09-18 DIAGNOSIS — I498 Other specified cardiac arrhythmias: Secondary | ICD-10-CM

## 2012-09-18 MED ORDER — MEMANTINE HCL 5 MG PO TABS: 5.0000 mg | ORAL_TABLET | Freq: Two times a day (BID) | ORAL | Status: DC

## 2012-09-18 NOTE — Clinical Social Work Note (Signed)
Pt from [facility]. Pt to return to SNF on 09/18/2012. Tammy with [facility] aware. CSW to assist with discharge planning needs.  Darlyn Chamber, MSW, LCSWA Clinical Social Work [phone]

## 2012-09-18 NOTE — Discharge Summary (Addendum)
Physician Discharge Summary  [name]:[id number] DOB: 08-28-1922 DOA: 09/17/2012  PCP: [name], MD  Admit date: 09/17/2012 Discharge date: 09/18/2012  Time spent: 40 minutes  Recommendations for Outpatient Follow-up:  1. D/c to SNF  Discharge Diagnoses:  Principal Problem:   Altered mental status  Active Problems:   Hypotension   Bradycardia   Dementia   Dehydration   CAD (coronary artery disease)   Recurrent falls   Anemia      Discharge Condition: fair  Diet recommendation: heart healthy  Filed Weights   09/17/12 2236  Weight: 57.5 kg (126 lb 12.2 oz)    History of present illness:  79 y.o. female with a Past Medical History of advanced dementia, recurrent falls, CAD hypertension who had a recent admission for generalized weakness and recurrent falls and was discharged back to a skilled nursing facility. During that time she had a full w/up to r/o CVA. while at the skilled nursing facility she was in her usual state of health until this morning, when she was noted to be more lethargic and much more altered than her usual baseline. She was noted to have hypotension and bradycardia while at the facility as well. She was then brought to the emergency room, by EMS . She was given Narcan without any improvement. In the emergency room, a CT of the head did not show any acute abnormalities, she was no longer hypotensive in the emergency room, UA and CXR were unremarkable for any infection. [facility] hospitalist admitted patient to telemetry.    Hospital Course:     Acute metabolic encephaloapthy  Likely in the setting of dehydration , hypotension and benzos use with worsening of her underlying dementia  patient is alert and awake today and quite confused trying to get out of bed. I am assuming she is close to baseline of her advance dementia. No further  neurological w/up done since patient now improving to baseline and full w/up recently done.  -underlying stroke  or seizure to cause current symptoms very unlikely .  -avoid benzos or narcotics. Patient on ativan at home which has been discontinue d.  Transient hypotension  -possibly from dehydration. now resolved  - continue gentle hydration  - patient afebrile,with normal WBC. No signs fo infection, troponin negative in ED. And stable on telemetry   .Sinus bradycardia  - she is on both Aricept and metoprolol which could have caused bradycardia and latter caused hypotension. holding off both. Will switch aricept  to namenda.  - Recent TSH was normal  - HR stable now   . CAD (coronary artery disease)  - Stable  - contine ASA and statin  . Dehydration  - improved with hydration  . Dementia  - has advanced dementia at baseline, CT head negative.   Marland Kitchen HTN (hypertension)  - Given transient hypotension, held  all antihypertensive medications . Resume amlodipine upon discharge as BP improved. holding metoprolol given bradycardia. Can be resumed at a lower dose as needed and if BP permits.   . Anemia  - chronic anemia with vitamin B12 deficiency  H&H stable    Code Status:  DNR    Family Communication: could not reach son amada hallisey or son in law [name]. left voicemail to the son.  Disposition Plan: return to NH   Consultants:  none Procedures:  none Antibiotics:  none   Discharge Exam: Filed Vitals:   09/18/12 1115  BP: 139/59  Pulse: 62  Temp: 98.5 F (36.9 C)  Resp: 20    General: elderly female in NAD,  HEENT: no pallor, moist oral mucosa  Chest: clear to auscultation b/l  Cardiovascular: NS1&S2, no murmurs, rubs or gallop  bd: soft, NT, ND, BS+ Ext: warm, no edema  CNS: AAOX1, ( oriented to self only), very confused.  Discharge Instructions     Medication List    STOP taking these medications       LORazepam 0.5 MG tablet  Commonly known as:  ATIVAN     metoprolol 50 MG tablet  Commonly known as:  LOPRESSOR      TAKE these medications        acetaminophen 650 MG CR tablet  Commonly known as:  TYLENOL  Take 650 mg by mouth 3 (three) times daily.     alendronate 70 MG tablet  Commonly known as:  FOSAMAX  Take 70 mg by mouth every 7 (seven) days. Takes on Mondays. Take with a full glass of water on an empty stomach.     amLODipine 10 MG tablet  Commonly known as:  NORVASC  Take 10 mg by mouth daily.     aspirin EC 81 MG tablet  Take 81 mg by mouth daily.     atorvastatin 10 MG tablet  Commonly known as:  LIPITOR  Take 5 mg by mouth daily.     calcium-vitamin D 500-200 MG-UNIT per tablet  Take 1 tablet by mouth daily.     cholecalciferol 400 UNITS Tabs tablet  Commonly known as:  VITAMIN D  Take 400 Units by mouth daily.     divalproex 125 MG capsule  Commonly known as:  DEPAKOTE SPRINKLE  Take 250 mg by mouth 2 (two) times daily.     DULoxetine 60 MG capsule  Commonly known as:  CYMBALTA  Take 60 mg by mouth daily.     ferrous sulfate 325 (65 FE) MG tablet  Take 325 mg by mouth daily with breakfast.     lisinopril 10 MG tablet  Commonly known as:  PRINIVIL,ZESTRIL  Take 10 mg by mouth daily.     memantine 5 MG tablet  Commonly known as:  NAMENDA  Take 1 tablet (5 mg total) by mouth 2 (two) times daily.     multivitamin with minerals Tabs tablet  Take 1 tablet by mouth daily.     sennosides-docusate sodium 8.6-50 MG tablet  Commonly known as:  SENOKOT-S  Take 1 tablet by mouth daily.     sucralfate 1 GM/10ML suspension  Commonly known as:  CARAFATE  Take 1 g by mouth 4 (four) times daily -  before meals and at bedtime.     vitamin B-12 1000 MCG tablet  Commonly known as:  CYANOCOBALAMIN  Take 1,000 mcg by mouth every other day.       Allergies  Allergen Reactions  . Captopril-Hydrochlorothiazide Other (See Comments)    Reaction unknown  . Niacin And Related Other (See Comments)    Reaction unknown  . Urispas [Flavoxate] Other (See Comments)    Reaction unknown  . Verapamil Other (See  Comments)    Reaction unknown       Follow-up Information   Follow up with [name], MD In 1 week.   Specialty:  Family Medicine   Contact information:   [address] [place] [address] [phone]        The results of significant diagnostics from this hospitalization (including imaging, microbiology, ancillary and laboratory) are listed below for  reference.    Significant Diagnostic Studies: Dg Chest 2 View  09/17/2012   CLINICAL DATA:  Altered mental status.  EXAM: CHEST  2 VIEW  COMPARISON:  09/09/2012  FINDINGS: Low lung volumes. Bibasilar opacities compatible with atelectasis. Heart is mildly enlarged. No visible effusions. No acute bony abnormality.  IMPRESSION: Low lung volumes, bibasilar atelectasis.   Electronically Signed   By: Charlett Nose   On: 09/17/2012 15:18   Dg Chest 2 View  09/09/2012   CLINICAL DATA:  Fall, left-sided pain.  EXAM: CHEST  2 VIEW  COMPARISON:  04/07/2012  FINDINGS: Heart is borderline in size. Bibasilar scarring or atelectasis. No effusions or pneumothorax. No acute bony abnormality. Diffuse osteopenia throughout the thoracic spine. No visible rib fracture.  IMPRESSION: Bibasilar scarring or atelectasis.   Electronically Signed   By: Charlett Nose   On: 09/09/2012 10:21   Dg Thoracic Spine 2 View  09/09/2012   CLINICAL DATA:  Fall, pain, back pain.  EXAM: THORACIC SPINE - 2 VIEW  COMPARISON:  Chest. Chest CT 04/06/2012  FINDINGS: There is mild to moderate compression fracture of the T11 vertebral body which is stable comparing to prior CT. No definite acute bony abnormality. No malalignment. Diffuse osteopenia and degenerative changes.  IMPRESSION: Stable moderate compression fracture at T11. No definite acute process.   Electronically Signed   By: Charlett Nose   On: 09/09/2012 10:35   Dg Pelvis 1-2 Views  09/09/2012   CLINICAL DATA:  Fall. Left-sided pain.  EXAM: PELVIS - 1-2 VIEW  COMPARISON:  None.  FINDINGS: There is no evidence  of pelvic fracture or diastasis. No other pelvic bone lesions are seen. Early degenerative changes in the hips bilaterally.  IMPRESSION: No acute findings.   Electronically Signed   By: Charlett Nose   On: 09/09/2012 10:22   Ct Head Wo Contrast  09/17/2012   *RADIOLOGY REPORT*  Clinical Data: Altered mental status, history dementia  CT HEAD WITHOUT CONTRAST  Technique:  Contiguous axial images were obtained from the base of the skull through the vertex without contrast.  Comparison: 09/09/2012; brain MRI - 09/10/2012  Findings:  There is residual minimal subcutaneous stranding about the superior lateral aspect of the right orbit (image 20, series 3) improved since recent prior examination. No displaced calvarial fracture.  Redemonstrated diffuse atrophy with sulcal prominence of centralized volume loss with commiserate ex vacuo dilatation of the ventricular system.  Rather extensive periventricular hypodensities appear grossly unchanged. Stable sequela of prior small infarct within the left cerebellum (image 6, series three).  Given extensive background parenchymal abnormalities, there is no CT evidence of acute large territory infarct.  No intraparenchymal or extra-axial mass or hemorrhage.  Unchanged size and configuration of the ventricles and basilar cisterns.  No midline shift.  Limited visualization of paranasal sinuses and mastoid air cells are normal.  Post bilateral cataract surgery.  IMPRESSION: 1.  Stable findings of atrophy and microvascular ischemic disease without acute intracranial process. 2.  Improved soft tissue swelling about the superior lateral aspect the right orbit.   Original Report Authenticated By: Tacey Ruiz, MD   Ct Head Wo Contrast  09/09/2012   CLINICAL DATA:  Numerous falls bruising, swelling of her right cheek, orbits, forehead. No loss of consciousness.  EXAM: CT HEAD WITHOUT CONTRAST  CT MAXILLOFACIAL WITHOUT CONTRAST  CT CERVICAL SPINE WITHOUT CONTRAST  TECHNIQUE:  Multidetector CT imaging of the head, cervical spine, and maxillofacial structures were performed using the standard protocol without intravenous  contrast. Multiplanar CT image reconstructions of the cervical spine and maxillofacial structures were also generated.  COMPARISON:  01/10/2012  FINDINGS: CT HEAD FINDINGS  There is atrophy and chronic small vessel disease changes. No acute intracranial abnormality. Specifically, no hemorrhage, hydrocephalus, mass lesion, acute infarction, or significant intracranial injury. No acute calvarial abnormality. Mild soft tissue swelling over the right forehead.  CT MAXILLOFACIAL FINDINGS  No evidence of facial fracture. Paranasal sinuses are clear. Orbital soft tissues unremarkable.  CT CERVICAL SPINE FINDINGS  Advanced degenerative changes throughout cervical spine, most degenerative disc and facet disease. Normal alignment. Prevertebral soft tissues are normal. No fracture. No epidural or paraspinal hematoma. Soft tissue calcifications within the posterior soft tissues, likely related to old injury.  IMPRESSION: CT HEAD IMPRESSION  No acute intracranial abnormality.  Atrophy, chronic microvascular disease.  CT MAXILLOFACIAL IMPRESSION  No evidence of facial fracture.  CT CERVICAL SPINE IMPRESSION  No acute bony abnormality.   Electronically Signed   By: Charlett Nose   On: 09/09/2012 11:05   Ct Cervical Spine Wo Contrast  09/09/2012   CLINICAL DATA:  Numerous falls bruising, swelling of her right cheek, orbits, forehead. No loss of consciousness.  EXAM: CT HEAD WITHOUT CONTRAST  CT MAXILLOFACIAL WITHOUT CONTRAST  CT CERVICAL SPINE WITHOUT CONTRAST  TECHNIQUE: Multidetector CT imaging of the head, cervical spine, and maxillofacial structures were performed using the standard protocol without intravenous contrast. Multiplanar CT image reconstructions of the cervical spine and maxillofacial structures were also generated.  COMPARISON:  01/10/2012  FINDINGS: CT HEAD FINDINGS   There is atrophy and chronic small vessel disease changes. No acute intracranial abnormality. Specifically, no hemorrhage, hydrocephalus, mass lesion, acute infarction, or significant intracranial injury. No acute calvarial abnormality. Mild soft tissue swelling over the right forehead.  CT MAXILLOFACIAL FINDINGS  No evidence of facial fracture. Paranasal sinuses are clear. Orbital soft tissues unremarkable.  CT CERVICAL SPINE FINDINGS  Advanced degenerative changes throughout cervical spine, most degenerative disc and facet disease. Normal alignment. Prevertebral soft tissues are normal. No fracture. No epidural or paraspinal hematoma. Soft tissue calcifications within the posterior soft tissues, likely related to old injury.  IMPRESSION: CT HEAD IMPRESSION  No acute intracranial abnormality.  Atrophy, chronic microvascular disease.  CT MAXILLOFACIAL IMPRESSION  No evidence of facial fracture.  CT CERVICAL SPINE IMPRESSION  No acute bony abnormality.   Electronically Signed   By: Charlett Nose   On: 09/09/2012 11:05   Mr Brain Wo Contrast  09/10/2012   *RADIOLOGY REPORT*  Clinical Data:  Numerous falls. Dementia.  Hypertension and hyperlipidemia.  Colon cancer.  MRI BRAIN WITHOUT CONTRAST MRA HEAD WITHOUT CONTRAST  Technique: Multiplanar, multiecho pulse sequences of the brain and surrounding structures were obtained according to standard protocol without intravenous contrast.  Angiographic images of the head were obtained using MRA technique without contrast.  Comparison: 09/09/2012 CT.  12/24/2011 MR.  MRI HEAD  Findings:  Motion degraded exam.  No acute infarct.  Right frontal/supraorbital subcutaneous hematoma.  No intracranial hemorrhage.  Moderate global atrophy.  Ventricular prominence probably related to atrophy although difficult to completely exclude a mild component hydrocephalus.  Appearance without significant change.  Prominent small vessel disease type changes.  No intracranial mass lesion detected  on this unenhanced exam.  Cervical spondylotic changes with spinal stenosis of the upper cervical spine.  Minimal paranasal sinus mucosal thickening.  IMPRESSION: No acute infarct.  Right frontal/supraorbital subcutaneous hematoma.  No intracranial hemorrhage.  Moderate global atrophy.  Ventricular prominence probably related to  atrophy although difficult to completely exclude a mild component hydrocephalus.  Appearance without significant change.  Prominent small vessel disease type changes.  MRA HEAD  Findings: Fetal type origin of the posterior cerebral arteries.  Hypoplastic A1 segment left anterior cerebral artery.  Mild narrowing M1 segment middle cerebral artery bilaterally. Moderate to marked narrowing of branches of the middle cerebral artery bilaterally greater on the right.  Small vertebral arteries and basilar artery bilaterally.  Narrowing of portions of the distal vertebral arteries and proximal basilar artery.  PICAs and AICAs not visualized.  Attenuated superior cerebellar artery bilaterally.  Narrowed distal posterior cerebral artery branches more notable on the left.  No aneurysm noted.  Previously questioned left PCOM aneurysm appears to be posterior communicating artery itself.  IMPRESSION: Intracranial atherosclerotic type changes as noted above.   Original Report Authenticated By: Lacy Duverney, M.D.   Mr Mra Head/brain Wo Cm  09/10/2012   *RADIOLOGY REPORT*  Clinical Data:  Numerous falls. Dementia.  Hypertension and hyperlipidemia.  Colon cancer.  MRI BRAIN WITHOUT CONTRAST MRA HEAD WITHOUT CONTRAST  Technique: Multiplanar, multiecho pulse sequences of the brain and surrounding structures were obtained according to standard protocol without intravenous contrast.  Angiographic images of the head were obtained using MRA technique without contrast.  Comparison: 09/09/2012 CT.  12/24/2011 MR.  MRI HEAD  Findings:  Motion degraded exam.  No acute infarct.  Right frontal/supraorbital subcutaneous  hematoma.  No intracranial hemorrhage.  Moderate global atrophy.  Ventricular prominence probably related to atrophy although difficult to completely exclude a mild component hydrocephalus.  Appearance without significant change.  Prominent small vessel disease type changes.  No intracranial mass lesion detected on this unenhanced exam.  Cervical spondylotic changes with spinal stenosis of the upper cervical spine.  Minimal paranasal sinus mucosal thickening.  IMPRESSION: No acute infarct.  Right frontal/supraorbital subcutaneous hematoma.  No intracranial hemorrhage.  Moderate global atrophy.  Ventricular prominence probably related to atrophy although difficult to completely exclude a mild component hydrocephalus.  Appearance without significant change.  Prominent small vessel disease type changes.  MRA HEAD  Findings: Fetal type origin of the posterior cerebral arteries.  Hypoplastic A1 segment left anterior cerebral artery.  Mild narrowing M1 segment middle cerebral artery bilaterally. Moderate to marked narrowing of branches of the middle cerebral artery bilaterally greater on the right.  Small vertebral arteries and basilar artery bilaterally.  Narrowing of portions of the distal vertebral arteries and proximal basilar artery.  PICAs and AICAs not visualized.  Attenuated superior cerebellar artery bilaterally.  Narrowed distal posterior cerebral artery branches more notable on the left.  No aneurysm noted.  Previously questioned left PCOM aneurysm appears to be posterior communicating artery itself.  IMPRESSION: Intracranial atherosclerotic type changes as noted above.   Original Report Authenticated By: Lacy Duverney, M.D.   Ct Maxillofacial Wo Cm  09/09/2012   CLINICAL DATA:  Numerous falls bruising, swelling of her right cheek, orbits, forehead. No loss of consciousness.  EXAM: CT HEAD WITHOUT CONTRAST  CT MAXILLOFACIAL WITHOUT CONTRAST  CT CERVICAL SPINE WITHOUT CONTRAST  TECHNIQUE: Multidetector CT  imaging of the head, cervical spine, and maxillofacial structures were performed using the standard protocol without intravenous contrast. Multiplanar CT image reconstructions of the cervical spine and maxillofacial structures were also generated.  COMPARISON:  01/10/2012  FINDINGS: CT HEAD FINDINGS  There is atrophy and chronic small vessel disease changes. No acute intracranial abnormality. Specifically, no hemorrhage, hydrocephalus, mass lesion, acute infarction, or significant intracranial injury. No acute calvarial abnormality.  Mild soft tissue swelling over the right forehead.  CT MAXILLOFACIAL FINDINGS  No evidence of facial fracture. Paranasal sinuses are clear. Orbital soft tissues unremarkable.  CT CERVICAL SPINE FINDINGS  Advanced degenerative changes throughout cervical spine, most degenerative disc and facet disease. Normal alignment. Prevertebral soft tissues are normal. No fracture. No epidural or paraspinal hematoma. Soft tissue calcifications within the posterior soft tissues, likely related to old injury.  IMPRESSION: CT HEAD IMPRESSION  No acute intracranial abnormality.  Atrophy, chronic microvascular disease.  CT MAXILLOFACIAL IMPRESSION  No evidence of facial fracture.  CT CERVICAL SPINE IMPRESSION  No acute bony abnormality.   Electronically Signed   By: Charlett Nose   On: 09/09/2012 11:05    Microbiology: Recent Results (from the past 240 hour(s))  MRSA PCR SCREENING     Status: None   Collection Time    09/09/12  7:17 PM      Result Value Range Status   MRSA by PCR NEGATIVE  NEGATIVE Final   Comment:            The GeneXpert MRSA Assay (FDA     approved for NASAL specimens     only), is one component of a     comprehensive MRSA colonization     surveillance program. It is not     intended to diagnose MRSA     infection nor to guide or     monitor treatment for     MRSA infections.     Labs: Basic Metabolic Panel:  Recent Labs Lab 09/17/12 1450  NA 142  K 4.0  CL  108  CO2 27  GLUCOSE 85  BUN 27*  CREATININE 0.81  CALCIUM 8.7   Liver Function Tests:  Recent Labs Lab 09/17/12 1601  AST 27  ALT 13  ALKPHOS 56  BILITOT 0.4  PROT 6.5  ALBUMIN 3.0*    Recent Labs Lab 09/17/12 1601  LIPASE 23    Recent Labs Lab 09/17/12 1601  AMMONIA 60   CBC:  Recent Labs Lab 09/17/12 1450  WBC 5.7  HGB 10.0*  HCT 30.5*  MCV 90.2  PLT 164   Cardiac Enzymes:  Recent Labs Lab 09/17/12 1601  TROPONINI <0.30   BNP: BNP (last 3 results) No results found for this basename: PROBNP,  in the last 8760 hours CBG:  Recent Labs Lab 09/17/12 1447  GLUCAP 76       Signed:  Tristy Udovich  [facility] 09/18/2012, 3:11 PM

## 2012-09-18 NOTE — Progress Notes (Signed)
Patient is combative during care( spitting, kicking and hitting) and is verbally abusive(uses profanity)

## 2012-09-18 NOTE — Progress Notes (Signed)
[facility] PROGRESS NOTE  [name]:[id number] DOB: Jan 14, 1923 DOA: 09/17/2012 PCP: [name], MD   Brief narrative 79 y.o. female with a Past Medical History of advanced dementia, recurrent falls, CAD hypertension who  had a recent admission for generalized weakness and recurrent falls and was discharged back to a skilled nursing facility. During that time she had a full w/up to r/o CVA.  while at the skilled nursing facility she was in her usual state of health until this morning, when she was noted to be more lethargic and much more altered than her usual baseline. She was noted to have hypotension and bradycardia while at the facility as well. She was then brought to the emergency room, by EMS . She was given  Narcan without any improvement. In the emergency room, a CT of the head did not show any acute abnormalities, she was no longer hypotensive in the emergency room, UA and CXR were unremarkable for any infection. [facility] admitted patient to telemetry.    Assessment/Plan: Acute metabolic encephaloapthy Likely in the setting of dehydration , hypotension and benzos use with worsening of her underlying dementia  patient is alert and awake today and quite confused trying to get out of bed. I am  assuming she is close to baseline of her advance dementia. i will not continue further neurological w/up since it was recently done and doubt underlying stroke or seizure to cause current symptoms. -avoid benzos or narcotics.  Transient hypotension  -possibly from dehydration. now resolved - continue gentle hydration - patient afebrile,with normal WBC. No signs fo infection, troponin negative in ED. And stable on telemetry   .Sinus bradycardia  - she is on both Aricept and metoprolol which could have caused bradycardia and latter caused hypotension. holding off both. Will switch to namenda. - Recent TSH was normal - HR stable now  . CAD (coronary artery disease)  -  Stable - contine ASA    . Dehydration  - continue gentle hydration for now  . Dementia  - Apparently has advanced dementia at baseline, CT head negative.  - Not sure if her current mental status represents simple delirium, and just progression/part of her usual dementia   . HTN (hypertension)  - Given transient hypotension, holding all antihypertensive medications  . Anemia  - chronic anemia with  vitamin B12 deficiency H&H stable  DVT Prophylaxis:  sq Lovenox   Code Status:  DNR  Code Status: Family Communication: will call son to update Disposition Plan: return to NH tomorrow if mental status stable   Consultants:  none  Procedures:  none  Antibiotics:  none  HPI/Subjective: Patient seen and examined this morning. She was reportedly agitated overnight. She is awake and alert today but quite confused.   Objective: Filed Vitals:   09/17/12 2236  BP: 146/73  Pulse: 61  Temp: 97.4 F (36.3 C)  Resp: 18   No intake or output data in the 24 hours ending 09/18/12 1052 Filed Weights   09/17/12 2236  Weight: 57.5 kg (126 lb 12.2 oz)    Exam:   General:  elderly female in NAD,  HEENT: no pallor, moist oral mucosa  Chest: clear to auscultation b/l  Cardiovascular: NS1&S2, no murmurs, rubs or gallop  Abd: soft, NT, ND, BS+  Ext: warm, no edema   CNS: AAOX1, ( oriented to self only), very confused.    Data Reviewed: Basic Metabolic Panel:  Recent Labs Lab 09/17/12 1450  NA 142  K 4.0  CL 108  CO2 27  GLUCOSE 85  BUN 27*  CREATININE 0.81  CALCIUM 8.7   Liver Function Tests:  Recent Labs Lab 09/17/12 1601  AST 27  ALT 13  ALKPHOS 56  BILITOT 0.4  PROT 6.5  ALBUMIN 3.0*    Recent Labs Lab 09/17/12 1601  LIPASE 23    Recent Labs Lab 09/17/12 1601  AMMONIA 60   CBC:  Recent Labs Lab 09/17/12 1450  WBC 5.7  HGB 10.0*  HCT 30.5*  MCV 90.2  PLT 164   Cardiac Enzymes:  Recent Labs Lab 09/17/12 1601   TROPONINI <0.30   BNP (last 3 results) No results found for this basename: PROBNP,  in the last 8760 hours CBG:  Recent Labs Lab 09/17/12 1447  GLUCAP 76    Recent Results (from the past 240 hour(s))  MRSA PCR SCREENING     Status: None   Collection Time    09/09/12  7:17 PM      Result Value Range Status   MRSA by PCR NEGATIVE  NEGATIVE Final   Comment:            The GeneXpert MRSA Assay (FDA     approved for NASAL specimens     only), is one component of a     comprehensive MRSA colonization     surveillance program. It is not     intended to diagnose MRSA     infection nor to guide or     monitor treatment for     MRSA infections.     Studies: Dg Chest 2 View  09/17/2012   CLINICAL DATA:  Altered mental status.  EXAM: CHEST  2 VIEW  COMPARISON:  09/09/2012  FINDINGS: Low lung volumes. Bibasilar opacities compatible with atelectasis. Heart is mildly enlarged. No visible effusions. No acute bony abnormality.  IMPRESSION: Low lung volumes, bibasilar atelectasis.   Electronically Signed   By: Charlett Nose   On: 09/17/2012 15:18   Ct Head Wo Contrast  09/17/2012   *RADIOLOGY REPORT*  Clinical Data: Altered mental status, history dementia  CT HEAD WITHOUT CONTRAST  Technique:  Contiguous axial images were obtained from the base of the skull through the vertex without contrast.  Comparison: 09/09/2012; brain MRI - 09/10/2012  Findings:  There is residual minimal subcutaneous stranding about the superior lateral aspect of the right orbit (image 20, series 3) improved since recent prior examination. No displaced calvarial fracture.  Redemonstrated diffuse atrophy with sulcal prominence of centralized volume loss with commiserate ex vacuo dilatation of the ventricular system.  Rather extensive periventricular hypodensities appear grossly unchanged. Stable sequela of prior small infarct within the left cerebellum (image 6, series three).  Given extensive background parenchymal  abnormalities, there is no CT evidence of acute large territory infarct.  No intraparenchymal or extra-axial mass or hemorrhage.  Unchanged size and configuration of the ventricles and basilar cisterns.  No midline shift.  Limited visualization of paranasal sinuses and mastoid air cells are normal.  Post bilateral cataract surgery.  IMPRESSION: 1.  Stable findings of atrophy and microvascular ischemic disease without acute intracranial process. 2.  Improved soft tissue swelling about the superior lateral aspect the right orbit.   Original Report Authenticated By: Tacey Ruiz, MD    Scheduled Meds: . aspirin  300 mg Rectal Daily  . divalproex  250 mg Oral BID  . enoxaparin (LOVENOX) injection  40 mg Subcutaneous Q24H  . sodium chloride  3 mL Intravenous Q12H  Continuous Infusions: . sodium chloride 75 mL/hr at 09/17/12 2311       Time spent: 25 minutes    Eddie North  [facility] Pager [phone] If 7PM-7AM, please contact night-coverage at [email or web address], password [facility] 09/18/2012, 10:52 AM  LOS: 1 day

## 2012-09-18 NOTE — ED Provider Notes (Signed)
I saw and evaluated the patient, reviewed the resident's note and I agree with the findings and plan.   Patient with acute altered mental status from nursing home. She had a waxing and waning level of alertness in the ED, c/w delirium. Will admit to hospitalist for further management.  Audree Camel, MD 09/18/12 [phone]

## 2012-09-25 ENCOUNTER — Non-Acute Institutional Stay (SKILLED_NURSING_FACILITY): Payer: Medicare Other | Admitting: Internal Medicine

## 2012-09-25 ENCOUNTER — Encounter: Payer: Self-pay | Admitting: Internal Medicine

## 2012-09-25 DIAGNOSIS — G301 Alzheimer's disease with late onset: Secondary | ICD-10-CM

## 2012-09-25 DIAGNOSIS — R5383 Other fatigue: Secondary | ICD-10-CM

## 2012-09-25 DIAGNOSIS — F02818 Dementia in other diseases classified elsewhere, unspecified severity, with other behavioral disturbance: Secondary | ICD-10-CM

## 2012-09-25 DIAGNOSIS — Z9181 History of falling: Secondary | ICD-10-CM

## 2012-09-25 DIAGNOSIS — I2510 Atherosclerotic heart disease of native coronary artery without angina pectoris: Secondary | ICD-10-CM

## 2012-09-25 DIAGNOSIS — F0392 Unspecified dementia, unspecified severity, with psychotic disturbance: Secondary | ICD-10-CM

## 2012-09-25 DIAGNOSIS — I10 Essential (primary) hypertension: Secondary | ICD-10-CM

## 2012-09-25 DIAGNOSIS — R531 Weakness: Secondary | ICD-10-CM

## 2012-09-25 DIAGNOSIS — F22 Delusional disorders: Secondary | ICD-10-CM

## 2012-09-25 DIAGNOSIS — R5381 Other malaise: Secondary | ICD-10-CM

## 2012-09-25 DIAGNOSIS — D509 Iron deficiency anemia, unspecified: Secondary | ICD-10-CM

## 2012-09-25 DIAGNOSIS — R296 Repeated falls: Secondary | ICD-10-CM

## 2012-09-25 DIAGNOSIS — F0390 Unspecified dementia without behavioral disturbance: Secondary | ICD-10-CM

## 2012-09-25 DIAGNOSIS — F0281 Dementia in other diseases classified elsewhere with behavioral disturbance: Secondary | ICD-10-CM

## 2012-09-25 NOTE — Progress Notes (Signed)
Patient ID: [name], female   DOB: January 18, 1923, 79 y.o.   MRN: [id number] Provider:  Gwenith Spitz. Renato Gails, D.O., C.M.D. Location: [facility]   PCP: [name], MD (before admission)  Code Status: DNR   Allergies  Allergen Reactions  . Captopril-Hydrochlorothiazide Other (See Comments)    Reaction unknown  . Niacin And Related Other (See Comments)    Reaction unknown  . Urispas [Flavoxate] Other (See Comments)    Reaction unknown  . Verapamil Other (See Comments)    Reaction unknown    Chief Complaint  Patient presents with  . Hospitalization Follow-up    falls--hospitalized 8/27-28    HPI: 79 y.o. female with h/o Alzheimer's disease, frequent falls, prior cecal cancer, htn, depression, senile osteoporosis, CAD, gastritis was seen for f/u from her last hospitalization for a fall.  Medications were reviewed and will work to reduce her use of benzos and hypnotics which increase her risk of falling.  Ambry persistently wants to get out of her wheelchair and try to walk.  She has several fall prevention approaches in place already including a low bed, smaller wheelchair, bedside mat, several alarms have been tried.  She is kept typically where she can be monitored easily.    ROS: Review of Systems  Constitutional: Negative for fever, chills and malaise/fatigue.  HENT: Negative for congestion.   Eyes: Negative for blurred vision.  Respiratory: Negative for shortness of breath.   Cardiovascular: Negative for chest pain and leg swelling.  Gastrointestinal: Negative for heartburn and constipation.  Genitourinary: Negative for dysuria.  Musculoskeletal: Positive for falls. Negative for back pain.  Skin: Negative for rash.  Neurological: Positive for weakness. Negative for dizziness, loss of consciousness and headaches.  Endo/Heme/Allergies: Bruises/bleeds easily.  Psychiatric/Behavioral: Positive for depression and memory loss. The patient is not nervous/anxious and  does not have insomnia.      Past Medical History  Diagnosis Date  . Diverticulosis of colon (without mention of hemorrhage) 06/24/2006  . Carcinoma of cecum 1992    History of  . Vitamin B12 deficiency   . Alzheimer's disease   . HTN (hypertension)   . Depression   . Osteoporosis, unspecified   . PVD (peripheral vascular disease)   . CAD (coronary artery disease)   . GERD (gastroesophageal reflux disease)   . H. pylori infection 2004  . Presbyesophagus   . Iron deficiency anemia, unspecified 01/02/2012  . Personal history of fall 01/04/2009  . Unspecified constipation   . Hemorrhage of rectum and anus   . Chest pain, unspecified   . Acute pharyngitis   . Malignant neoplasm of colon, unspecified site   . Edema   . Cataract   . HLD (hyperlipidemia)    Past Surgical History  Procedure Laterality Date  . Gallbladder surgery    . Abdominal hysterectomy  1995    with bso  . Right colectomy    . Cataract extraction  2001  . Cholecystectomy Right    Social History:   reports that she has quit smoking. Her smoking use included Cigarettes. She smoked 0.00 packs per day. She has never used smokeless tobacco. She reports that she does not drink alcohol or use illicit drugs.  Family History  Problem Relation Age of Onset  . Heart disease Mother   . Colon cancer Neg Hx     Medications: Patient's Medications  New Prescriptions   No medications on file  Previous Medications   ACETAMINOPHEN (TYLENOL) 650 MG CR TABLET  Take 650 mg by mouth 3 (three) times daily.   ALENDRONATE (FOSAMAX) 70 MG TABLET    Take 70 mg by mouth every 7 (seven) days. Takes on Mondays. Take with a full glass of water on an empty stomach.   AMLODIPINE (NORVASC) 10 MG TABLET    Take 10 mg by mouth daily.   ASPIRIN EC 81 MG TABLET    Take 81 mg by mouth daily.   ATORVASTATIN (LIPITOR) 10 MG TABLET    Take 5 mg by mouth daily.   CALCIUM CARBONATE-VITAMIN D (CALCIUM-VITAMIN D) 500-200 MG-UNIT PER TABLET     Take 1 tablet by mouth daily.   CHOLECALCIFEROL (VITAMIN D) 400 UNITS TABS TABLET    Take 400 Units by mouth daily.   DIVALPROEX (DEPAKOTE SPRINKLE) 125 MG CAPSULE    Take 250 mg by mouth 2 (two) times daily.   DULOXETINE (CYMBALTA) 60 MG CAPSULE    Take 60 mg by mouth daily.   FERROUS SULFATE 325 (65 FE) MG TABLET    Take 325 mg by mouth daily with breakfast.   LISINOPRIL (PRINIVIL,ZESTRIL) 10 MG TABLET    Take 10 mg by mouth daily.   MEMANTINE (NAMENDA) 5 MG TABLET    Take 1 tablet (5 mg total) by mouth 2 (two) times daily.   MULTIPLE VITAMIN (MULTIVITAMIN WITH MINERALS) TABS TABLET    Take 1 tablet by mouth daily.   SENNOSIDES-DOCUSATE SODIUM (SENOKOT-S) 8.6-50 MG TABLET    Take 1 tablet by mouth daily.   SUCRALFATE (CARAFATE) 1 GM/10ML SUSPENSION    Take 1 g by mouth 4 (four) times daily -  before meals and at bedtime.   VITAMIN B-12 (CYANOCOBALAMIN) 1000 MCG TABLET    Take 1,000 mcg by mouth every other day.  Modified Medications   No medications on file  Discontinued Medications   No medications on file     Physical Exam: Filed Vitals:   09/25/12 1722  BP: 122/49  Pulse: 58  Temp: 98.7 F (37.1 C)  Resp: 14  Physical Exam  Constitutional: No distress.  Petite increasingly frail white female seen seated in her wheelchair  HENT:  Right Ear: External ear normal.  Left Ear: External ear normal.  Nose: Nose normal.  Mouth/Throat: Oropharynx is clear and moist. No oropharyngeal exudate.  Eyes: Conjunctivae and EOM are normal. Pupils are equal, round, and reactive to light.  Neck: Normal range of motion. No JVD present.  Cardiovascular: Normal rate, regular rhythm, normal heart sounds and intact distal pulses.   Pulmonary/Chest: Effort normal and breath sounds normal. No respiratory distress.  Abdominal: Soft. Bowel sounds are normal. She exhibits no distension and no mass. There is no tenderness.  Musculoskeletal: Normal range of motion. She exhibits no edema and no  tenderness.  Neurological: She is alert. No cranial nerve deficit.  Oriented to person only  Skin: Skin is warm. There is pallor.  Psychiatric: She has a normal mood and affect.  Very pleasant, confused    Labs reviewed: Basic Metabolic Panel:  Recent Labs  16/10/96 1545  04/06/12 1725  04/08/12 0646  09/09/12 0954 09/09/12 1515 09/10/12 0215 09/17/12 1450  NA 139  < >  --   < > 138  < > 141  --  141 142  K 3.1*  < >  --   < > 3.5  < > 3.7  --  3.8 4.0  CL 99  < >  --   < > 106  < > 103  --  105 108  CO2 28  < >  --   < > 23  < > 30  --  25 27  GLUCOSE 84  < >  --   < > 78  < > 77  --  78 85  BUN 22  < >  --   < > 13  < > 20  --  25* 27*  CREATININE 0.79  < >  --   < > 0.65  < > 0.73 0.66 0.75 0.81  CALCIUM 10.0  < >  --   < > 8.7  < > 9.7  --  9.3 8.7  MG 2.3  --  1.9  --  2.0  --   --   --   --   --   PHOS 3.7  --   --   --   --   --   --   --   --   --   < > = values in this interval not displayed. Liver Function Tests:  Recent Labs  04/06/12 1430 04/07/12 0202 09/17/12 1601  AST 61* 45* 27  ALT 41* 39* 13  ALKPHOS 69 52 56  BILITOT 0.8 0.7 0.4  PROT 6.9 5.4* 6.5  ALBUMIN 3.7 2.7* 3.0*    Recent Labs  09/17/12 1601  LIPASE 23    Recent Labs  09/17/12 1601  AMMONIA 60   CBC:  Recent Labs  04/06/12 1430 04/07/12 0202  09/09/12 0954 09/09/12 1515 09/10/12 0215 09/17/12 1450  WBC 9.7 9.7  < > 8.0 7.1 7.5 5.7  NEUTROABS 8.2* 7.9*  --  4.8  --   --   --   HGB 12.3 9.8*  < > 11.3* 11.1* 10.8* 10.0*  HCT 38.5 30.1*  < > 34.3* 32.9* 31.8* 30.5*  MCV 86.5 85.8  < > 89.1 88.7 88.6 90.2  PLT 176 131*  < > 196 199 197 164  < > = values in this interval not displayed. Cardiac Enzymes:  Recent Labs  09/09/12 2011 09/10/12 0215 09/17/12 1601  TROPONINI <0.30 <0.30 <0.30  CBG:  Recent Labs  09/17/12 1447  GLUCAP 76    Assessment/Plan 1. Recurrent falls -again, will try to reduce use of beer's criteria meds and focus on redirection,  occupying her attention so she is not trying to get up and walk, ativan was stopped at hospital--should not be reordered b/c it will not prevent her from falling and she is not anxious  2. Dementia of the Alzheimer's type, with late onset, with delusions -progressing, no longer able to safely ambulate and requires more adl help so can no longer live at [facility] -cont aricept--unsure why she has never been started on namenda with it  3. Generalized weakness -to receive therapy post hospitalization due to proximal muscle weakness which is big contributor to falling along with progression of her dementia.  4.  Iron deficiency anemia -cont ferrous sulfate, f/u cbc at routine visit  5.  CAD -cont statin therapy due to this  6.  HTN bp at goal with amlodipine, lisinopril, metoprolol  Functional status: dependent in bathing, dressing and grooming, but does attempt ambulation and falls, requires 1 person assist and should use walker when not in wheelchair

## 2012-10-20 ENCOUNTER — Emergency Department (HOSPITAL_COMMUNITY)
Admission: EM | Admit: 2012-10-20 | Discharge: 2012-10-20 | Disposition: A | Discharge status: Home / Self Care | Payer: Medicare Other | Attending: Emergency Medicine | Admitting: Emergency Medicine

## 2012-10-20 ENCOUNTER — Emergency Department (HOSPITAL_COMMUNITY): Payer: Medicare Other

## 2012-10-20 ENCOUNTER — Encounter (HOSPITAL_COMMUNITY): Payer: Self-pay | Admitting: Emergency Medicine

## 2012-10-20 DIAGNOSIS — W1809XA Striking against other object with subsequent fall, initial encounter: Secondary | ICD-10-CM | POA: Insufficient documentation

## 2012-10-20 DIAGNOSIS — S60222A Contusion of left hand, initial encounter: Secondary | ICD-10-CM

## 2012-10-20 DIAGNOSIS — E538 Deficiency of other specified B group vitamins: Secondary | ICD-10-CM | POA: Insufficient documentation

## 2012-10-20 DIAGNOSIS — S60229A Contusion of unspecified hand, initial encounter: Secondary | ICD-10-CM | POA: Diagnosis not present

## 2012-10-20 DIAGNOSIS — Z79899 Other long term (current) drug therapy: Secondary | ICD-10-CM | POA: Insufficient documentation

## 2012-10-20 DIAGNOSIS — Z9181 History of falling: Secondary | ICD-10-CM | POA: Insufficient documentation

## 2012-10-20 DIAGNOSIS — D509 Iron deficiency anemia, unspecified: Secondary | ICD-10-CM | POA: Diagnosis not present

## 2012-10-20 DIAGNOSIS — F0280 Dementia in other diseases classified elsewhere without behavioral disturbance: Secondary | ICD-10-CM | POA: Diagnosis not present

## 2012-10-20 DIAGNOSIS — S51809A Unspecified open wound of unspecified forearm, initial encounter: Secondary | ICD-10-CM | POA: Diagnosis not present

## 2012-10-20 DIAGNOSIS — Y9389 Activity, other specified: Secondary | ICD-10-CM | POA: Insufficient documentation

## 2012-10-20 DIAGNOSIS — S51812A Laceration without foreign body of left forearm, initial encounter: Secondary | ICD-10-CM

## 2012-10-20 DIAGNOSIS — W19XXXA Unspecified fall, initial encounter: Secondary | ICD-10-CM

## 2012-10-20 DIAGNOSIS — Z7982 Long term (current) use of aspirin: Secondary | ICD-10-CM | POA: Insufficient documentation

## 2012-10-20 DIAGNOSIS — F3289 Other specified depressive episodes: Secondary | ICD-10-CM | POA: Insufficient documentation

## 2012-10-20 DIAGNOSIS — Z87891 Personal history of nicotine dependence: Secondary | ICD-10-CM | POA: Insufficient documentation

## 2012-10-20 DIAGNOSIS — F329 Major depressive disorder, single episode, unspecified: Secondary | ICD-10-CM | POA: Diagnosis not present

## 2012-10-20 DIAGNOSIS — G309 Alzheimer's disease, unspecified: Secondary | ICD-10-CM | POA: Insufficient documentation

## 2012-10-20 DIAGNOSIS — S0101XA Laceration without foreign body of scalp, initial encounter: Secondary | ICD-10-CM

## 2012-10-20 DIAGNOSIS — K219 Gastro-esophageal reflux disease without esophagitis: Secondary | ICD-10-CM | POA: Insufficient documentation

## 2012-10-20 DIAGNOSIS — S0100XA Unspecified open wound of scalp, initial encounter: Secondary | ICD-10-CM | POA: Insufficient documentation

## 2012-10-20 DIAGNOSIS — E785 Hyperlipidemia, unspecified: Secondary | ICD-10-CM | POA: Insufficient documentation

## 2012-10-20 DIAGNOSIS — Z9849 Cataract extraction status, unspecified eye: Secondary | ICD-10-CM | POA: Insufficient documentation

## 2012-10-20 DIAGNOSIS — Z85038 Personal history of other malignant neoplasm of large intestine: Secondary | ICD-10-CM | POA: Insufficient documentation

## 2012-10-20 DIAGNOSIS — M810 Age-related osteoporosis without current pathological fracture: Secondary | ICD-10-CM | POA: Diagnosis not present

## 2012-10-20 DIAGNOSIS — I10 Essential (primary) hypertension: Secondary | ICD-10-CM | POA: Insufficient documentation

## 2012-10-20 DIAGNOSIS — Z7983 Long term (current) use of bisphosphonates: Secondary | ICD-10-CM | POA: Insufficient documentation

## 2012-10-20 DIAGNOSIS — F0390 Unspecified dementia without behavioral disturbance: Secondary | ICD-10-CM

## 2012-10-20 DIAGNOSIS — I2510 Atherosclerotic heart disease of native coronary artery without angina pectoris: Secondary | ICD-10-CM | POA: Insufficient documentation

## 2012-10-20 DIAGNOSIS — Y9210 Unspecified residential institution as the place of occurrence of the external cause: Secondary | ICD-10-CM | POA: Insufficient documentation

## 2012-10-20 DIAGNOSIS — Z8619 Personal history of other infectious and parasitic diseases: Secondary | ICD-10-CM | POA: Insufficient documentation

## 2012-10-20 LAB — BASIC METABOLIC PANEL
BUN: 21 mg/dL (ref 6–23)
CO2: 29 mEq/L (ref 19–32)
Chloride: 104 mEq/L (ref 96–112)
Creatinine, Ser: 0.65 mg/dL (ref 0.50–1.10)
GFR calc Af Amer: 89 mL/min — ABNORMAL LOW (ref >90)
Glucose, Bld: 79 mg/dL (ref 70–99)
Potassium: 3.8 mEq/L (ref 3.5–5.1)

## 2012-10-20 LAB — CBC WITH DIFFERENTIAL/PLATELET
HCT: 33.7 % — ABNORMAL LOW (ref 36.0–46.0)
Hemoglobin: 11.2 g/dL — ABNORMAL LOW (ref 12.0–15.0)
Lymphocytes Relative: 35 % (ref 12–46)
Lymphs Abs: 3 10*3/uL (ref 0.7–4.0)
MCHC: 33.2 g/dL (ref 30.0–36.0)
Monocytes Absolute: 0.7 10*3/uL (ref 0.1–1.0)
Monocytes Relative: 8 % (ref 3–12)
Neutro Abs: 4.7 10*3/uL (ref 1.7–7.7)
Neutrophils Relative %: 55 % (ref 43–77)
RBC: 3.79 MIL/uL — ABNORMAL LOW (ref 3.87–5.11)
WBC: 8.6 10*3/uL (ref 4.0–10.5)

## 2012-10-20 LAB — URINALYSIS, ROUTINE W REFLEX MICROSCOPIC
Glucose, UA: NEGATIVE mg/dL
Ketones, ur: NEGATIVE mg/dL
Leukocytes, UA: NEGATIVE
Nitrite: NEGATIVE
Specific Gravity, Urine: 1.024 (ref 1.005–1.030)
pH: 7 (ref 5.0–8.0)

## 2012-10-20 NOTE — ED Notes (Signed)
Pt brought to ED by EMS from [facility] after multiple falls tonight. Pt rambles incoherently. Small lac noted to L side of head, swelling and bruising noted to L dorsal hand, bandaged wound noted to L forearm.

## 2012-10-20 NOTE — ED Notes (Signed)
Pt to radiology via stretcher.  

## 2012-10-20 NOTE — ED Notes (Signed)
Report called to Simone,LPN at [facility]. [facility] called for transport

## 2012-10-20 NOTE — ED Provider Notes (Signed)
CSN: [id number]     Arrival date & time 10/20/12  0210 History   First MD Initiated Contact with Patient 10/20/12 0253     Chief Complaint  Patient presents with  . Fall   (Consider location/radiation/quality/duration/timing/severity/associated sxs/prior Treatment) HPI 79 year old female presents to emergency department via EMS from her nursing facility after multiple falls.  Tonight.  Patient has history of dementia.  She cannot give history.  She is speaking in several foreign languages.  Patient with laceration to left scalp, she has some bruising to her left hand, and a skin tear to her left forearm.  No further history available  Past Medical History  Diagnosis Date  . Diverticulosis of colon (without mention of hemorrhage) 06/24/2006  . Carcinoma of cecum 1992    History of  . Vitamin B12 deficiency   . Alzheimer's disease   . HTN (hypertension)   . Depression   . Osteoporosis, unspecified   . PVD (peripheral vascular disease)   . CAD (coronary artery disease)   . GERD (gastroesophageal reflux disease)   . H. pylori infection 2004  . Presbyesophagus   . Iron deficiency anemia, unspecified 01/02/2012  . Personal history of fall 01/04/2009  . Unspecified constipation   . Hemorrhage of rectum and anus   . Chest pain, unspecified   . Acute pharyngitis   . Malignant neoplasm of colon, unspecified site   . Edema   . Cataract   . HLD (hyperlipidemia)    Past Surgical History  Procedure Laterality Date  . Gallbladder surgery    . Abdominal hysterectomy  1995    with bso  . Right colectomy    . Cataract extraction  2001  . Cholecystectomy Right    Family History  Problem Relation Age of Onset  . Heart disease Mother   . Colon cancer Neg Hx    History  Substance Use Topics  . Smoking status: Former Smoker    Types: Cigarettes  . Smokeless tobacco: Never Used  . Alcohol Use: No   OB History   Grav Para Term Preterm Abortions TAB SAB Ect Mult Living                  Review of Systems  Unable to perform ROS: Dementia    Allergies  Captopril-hydrochlorothiazide; Niacin and related; Urispas; and Verapamil  Home Medications   Current Outpatient Rx  Name  Route  Sig  Dispense  Refill  . acetaminophen (TYLENOL) 650 MG CR tablet   Oral   Take 650 mg by mouth 3 (three) times daily.         Marland Kitchen alendronate (FOSAMAX) 70 MG tablet   Oral   Take 70 mg by mouth every 7 (seven) days. Takes on Mondays. Take with a full glass of water on an empty stomach.         Marland Kitchen amLODipine (NORVASC) 10 MG tablet   Oral   Take 10 mg by mouth daily.         Marland Kitchen aspirin EC 81 MG tablet   Oral   Take 81 mg by mouth daily.         Marland Kitchen atorvastatin (LIPITOR) 10 MG tablet   Oral   Take 5 mg by mouth daily. Take half a tab to equal 5mg          . Calcium Carbonate-Vitamin D (CALCIUM-VITAMIN D) 500-200 MG-UNIT per tablet   Oral   Take 1 tablet by mouth daily.         Marland Kitchen  cholecalciferol (VITAMIN D) 400 UNITS TABS tablet   Oral   Take 400 Units by mouth daily.         . divalproex (DEPAKOTE SPRINKLE) 125 MG capsule   Oral   Take 250 mg by mouth 2 (two) times daily.         Marland Kitchen donepezil (ARICEPT) 10 MG tablet   Oral   Take 10 mg by mouth at bedtime as needed (for memory).         . DULoxetine (CYMBALTA) 60 MG capsule   Oral   Take 60 mg by mouth daily.         . ferrous sulfate 325 (65 FE) MG tablet   Oral   Take 325 mg by mouth daily with breakfast.         . haloperidol (HALDOL) 2 MG tablet   Oral   Take 2 mg by mouth every 6 (six) hours as needed (as needed for agitation).         Marland Kitchen lisinopril (PRINIVIL,ZESTRIL) 10 MG tablet   Oral   Take 10 mg by mouth daily.         . metoprolol (LOPRESSOR) 50 MG tablet   Oral   Take 50 mg by mouth 2 (two) times daily.         . Multiple Vitamin (MULTIVITAMIN WITH MINERALS) TABS tablet   Oral   Take 1 tablet by mouth daily.         . sennosides-docusate sodium (SENOKOT-S) 8.6-50 MG  tablet   Oral   Take 1 tablet by mouth daily.         . sucralfate (CARAFATE) 1 GM/10ML suspension   Oral   Take 1 g by mouth 4 (four) times daily -  before meals and at bedtime.         . vitamin B-12 (CYANOCOBALAMIN) 1000 MCG tablet   Oral   Take 1,000 mcg by mouth every other day.          BP 151/36  Pulse 65  Temp(Src) 97.9 F (36.6 C) (Oral)  Resp 18  SpO2 100% Physical Exam  Constitutional: She appears well-developed and well-nourished.  HENT:  Head: Normocephalic and atraumatic.  Right Ear: External ear normal.  Left Ear: External ear normal.  Nose: Nose normal.  Mouth/Throat: Oropharynx is clear and moist.  Scalp laceration, left parietal, V-shaped, 1 cm  Eyes: Conjunctivae and EOM are normal. Pupils are equal, round, and reactive to light.  Neck: Normal range of motion. Neck supple. No JVD present. No tracheal deviation present. No thyromegaly present.  Cardiovascular: Normal rate, regular rhythm, normal heart sounds and intact distal pulses.  Exam reveals no gallop and no friction rub.   No murmur heard. Pulmonary/Chest: Effort normal and breath sounds normal. No stridor. No respiratory distress. She has no wheezes. She has no rales. She exhibits no tenderness.  Abdominal: Soft. Bowel sounds are normal. She exhibits no distension and no mass. There is no tenderness. There is no rebound and no guarding.  Musculoskeletal: Normal range of motion. She exhibits no edema and no tenderness.  Ecchymosis noted to left dorsal hand, skin tear to left forearm  Lymphadenopathy:    She has no cervical adenopathy.  Neurological: She is alert. She has normal reflexes. No cranial nerve deficit. She exhibits normal muscle tone. Coordination normal.  Skin: Skin is warm and dry. No rash noted. No erythema. No pallor.    ED Course  Procedures (including critical care time) Labs Review  Labs Reviewed  CBC WITH DIFFERENTIAL - Abnormal; Notable for the following:    RBC 3.79 (*)     Hemoglobin 11.2 (*)    HCT 33.7 (*)    All other components within normal limits  BASIC METABOLIC PANEL - Abnormal; Notable for the following:    GFR calc non Af Amer 77 (*)    GFR calc Af Amer 89 (*)    All other components within normal limits  URINALYSIS, ROUTINE W REFLEX MICROSCOPIC   Imaging Review Ct Head Wo Contrast  10/20/2012   *RADIOLOGY REPORT*  Clinical Data:  Fall  CT HEAD WITHOUT CONTRAST CT CERVICAL SPINE WITHOUT CONTRAST  Technique:  Multidetector CT imaging of the head and cervical spine was performed following the standard protocol without intravenous contrast.  Multiplanar CT image reconstructions of the cervical spine were also generated.  Comparison:  Prior study from 09/17/2012  CT HEAD  Findings: Prominent atrophy with severe microvascular ischemic disease changes are again seen, stable as compared to the prior examination.  Remote left cerebellar infarct again noted. There is persistent mild soft tissue swelling at the right forehead.  is no acute intracranial hemorrhage or infarct.  No mass or midline shift.  No extra-axial fluid collection.  Orbits are within normal limits.  Calvarium is intact.  Paranasal sinuses and mastoid air cells are clear.  IMPRESSION: 1. No acute intracranial process  2. Atrophy with chronic microvascular ischemic changes.  CT CERVICAL SPINE  Findings: Vertebral bodies are normally aligned with preservation of the normal cervical lordosis.  Normal C1-2 articulations are preserved.  The dens is intact.  No prevertebral soft tissue swelling.  Severe multilevel degenerative disc disease is seen throughout the cervical spine as evidenced by intervertebral disc space narrowing endplate osteophytosis and sclerosis.  These findings are most severe at C5-6 and C6-7.  Visualized lung apices are clear. Debris is present within the mid esophageal lumen, which likely places the patient at risk for aspiration.  IMPRESSION: 1.  No CT evidence of acute fracture  listhesis within the cervical spine. 2.  Multilevel degenerative disc disease, most severe at C5-6 and C6-7. 3.  Debris within the partially visualized mid esophagus.  This likely places the patient at risk for aspiration.   Original Report Authenticated By: Rise Mu, M.D.   Ct Cervical Spine Wo Contrast  10/20/2012   *RADIOLOGY REPORT*  Clinical Data:  Fall  CT HEAD WITHOUT CONTRAST CT CERVICAL SPINE WITHOUT CONTRAST  Technique:  Multidetector CT imaging of the head and cervical spine was performed following the standard protocol without intravenous contrast.  Multiplanar CT image reconstructions of the cervical spine were also generated.  Comparison:  Prior study from 09/17/2012  CT HEAD  Findings: Prominent atrophy with severe microvascular ischemic disease changes are again seen, stable as compared to the prior examination.  Remote left cerebellar infarct again noted. There is persistent mild soft tissue swelling at the right forehead.  is no acute intracranial hemorrhage or infarct.  No mass or midline shift.  No extra-axial fluid collection.  Orbits are within normal limits.  Calvarium is intact.  Paranasal sinuses and mastoid air cells are clear.  IMPRESSION: 1. No acute intracranial process  2. Atrophy with chronic microvascular ischemic changes.  CT CERVICAL SPINE  Findings: Vertebral bodies are normally aligned with preservation of the normal cervical lordosis.  Normal C1-2 articulations are preserved.  The dens is intact.  No prevertebral soft tissue swelling.  Severe multilevel degenerative disc disease is seen throughout  the cervical spine as evidenced by intervertebral disc space narrowing endplate osteophytosis and sclerosis.  These findings are most severe at C5-6 and C6-7.  Visualized lung apices are clear. Debris is present within the mid esophageal lumen, which likely places the patient at risk for aspiration.  IMPRESSION: 1.  No CT evidence of acute fracture listhesis within the  cervical spine. 2.  Multilevel degenerative disc disease, most severe at C5-6 and C6-7. 3.  Debris within the partially visualized mid esophagus.  This likely places the patient at risk for aspiration.   Original Report Authenticated By: Rise Mu, M.D.   Dg Hand Complete Left  10/20/2012   CLINICAL DATA:  Fall, bruising.  EXAM: LEFT HAND - COMPLETE 3+ VIEW  COMPARISON:  None.  FINDINGS: No acute bony abnormality. Specifically, no fracture, subluxation, or dislocation. Soft tissues are intact.  IMPRESSION: No acute bony abnormality.   Electronically Signed   By: Charlett Nose M.D.   On: 10/20/2012 03:37   LACERATION REPAIR Performed by: Olivia Mackie Authorized by: Olivia Mackie Consent: Verbal consent obtained. Risks and benefits: risks, benefits and alternatives were discussed Consent given by: patient Patient identity confirmed: provided demographic data Prepped and Draped in normal sterile fashion Wound explored  Laceration Location: left scalp  Laceration Length: 1cm  No Foreign Bodies seen or palpated  Anesthesia: none  Local anesthetic: none Anesthetic total: nonel  Irrigation method: syringe Amount of cleaning: standard  Skin closure: skin staples  Number of sutures: 2  Technique: staples  Patient tolerance: Patient tolerated the procedure well with no immediate complications.  MDM   1. Fall at nursing home, initial encounter   2. Dementia   3. Scalp laceration, initial encounter   4. Contusion of left hand, initial encounter   5. Skin tear of forearm without complication, left, initial encounter    79 year old female with multiple falls, h/o same.  Workup here without specific cause for her fall, no electrolyte abnormalities or urinary tract infection.  Wound was repaired with surgical staples.  X-rays and CT scans unremarkable    Olivia Mackie, MD 10/20/12 [phone]

## 2012-10-20 NOTE — ED Notes (Signed)
Per EMS, pt is from [facility]. Staff came in and found the pt had fallen onto the tile floor. No LOC noted. Pt has a small head lac to the left occipital lobe and a skin tear (from a fall earlier tonight) to the left wrist. Pt is not on blood thinners.

## 2012-10-20 NOTE — ED Notes (Signed)
Bed: ZO10 Expected date: 10/20/12 Expected time: 2:02 AM Means of arrival: Ambulance Comments: Bed 19, EMS, 80 F, Fall

## 2012-11-20 ENCOUNTER — Encounter: Payer: Self-pay | Admitting: Internal Medicine

## 2012-11-20 ENCOUNTER — Non-Acute Institutional Stay (SKILLED_NURSING_FACILITY): Payer: Medicare Other | Admitting: Internal Medicine

## 2012-11-20 DIAGNOSIS — M810 Age-related osteoporosis without current pathological fracture: Secondary | ICD-10-CM | POA: Insufficient documentation

## 2012-11-20 DIAGNOSIS — R296 Repeated falls: Secondary | ICD-10-CM

## 2012-11-20 DIAGNOSIS — F0281 Dementia in other diseases classified elsewhere with behavioral disturbance: Secondary | ICD-10-CM

## 2012-11-20 DIAGNOSIS — I10 Essential (primary) hypertension: Secondary | ICD-10-CM

## 2012-11-20 DIAGNOSIS — Z9181 History of falling: Secondary | ICD-10-CM

## 2012-11-20 DIAGNOSIS — D509 Iron deficiency anemia, unspecified: Secondary | ICD-10-CM

## 2012-11-20 DIAGNOSIS — I2510 Atherosclerotic heart disease of native coronary artery without angina pectoris: Secondary | ICD-10-CM

## 2012-11-20 DIAGNOSIS — E785 Hyperlipidemia, unspecified: Secondary | ICD-10-CM

## 2012-11-20 DIAGNOSIS — F0390 Unspecified dementia without behavioral disturbance: Secondary | ICD-10-CM

## 2012-11-20 DIAGNOSIS — F329 Major depressive disorder, single episode, unspecified: Secondary | ICD-10-CM

## 2012-11-20 DIAGNOSIS — K219 Gastro-esophageal reflux disease without esophagitis: Secondary | ICD-10-CM

## 2012-11-20 NOTE — Assessment & Plan Note (Signed)
Stable on her current meds, both aricept and antipsychotics; she is not somnalent and is very pleasant

## 2012-11-20 NOTE — Assessment & Plan Note (Signed)
Pt on fosamax, calcium and vit D-even with fall have had no fractures recently

## 2012-11-20 NOTE — Progress Notes (Signed)
MRN: [id number] Name: [name]  Sex: female Age: 79 y.o. DOB: 02-Dec-1922  PSC #: Ronni Rumble Facility/Room: 119A Level Of Care: SNF Provider: Merrilee Seashore D Emergency Contacts: Extended Emergency Contact Information Primary Emergency Contact: Ricki Rodriguez [place] Mobile Phone: [phone] Relation: Son Secondary Emergency Contact: [name],[name] Address: [address]          [place], [place] [address] [place] Home Phone: [phone] Relation: Other  Code Status: DNR  Allergies: Captopril-hydrochlorothiazide; Niacin and related; Urispas; and Verapamil  Chief Complaint  Patient presents with  . Medical Managment of Chronic Issues    HPI: Patient is 79 y.o. female who is being evaluated for routine medical problems.   Past Medical History  Diagnosis Date  . Diverticulosis of colon (without mention of hemorrhage) 06/24/2006  . Carcinoma of cecum 1992    History of  . Vitamin B12 deficiency   . Alzheimer's disease   . HTN (hypertension)   . Depression   . PVD (peripheral vascular disease)   . CAD (coronary artery disease)   . GERD (gastroesophageal reflux disease)   . H. pylori infection 2004  . Presbyesophagus   . Iron deficiency anemia, unspecified 01/02/2012  . Personal history of fall 01/04/2009  . Unspecified constipation   . Hemorrhage of rectum and anus   . Chest pain, unspecified   . Acute pharyngitis   . Malignant neoplasm of colon, unspecified site   . Edema   . Cataract   . HLD (hyperlipidemia)   . Osteoporosis, unspecified     Past Surgical History  Procedure Laterality Date  . Gallbladder surgery    . Abdominal hysterectomy  1995    with bso  . Right colectomy    . Cataract extraction  2001  . Cholecystectomy Right       Medication List       This list is accurate as of: 11/20/12  1:00 PM.  Always use your most recent med list.               acetaminophen 650 MG CR tablet  Commonly known as:   TYLENOL  Take 650 mg by mouth 3 (three) times daily.     alendronate 70 MG tablet  Commonly known as:  FOSAMAX  Take 70 mg by mouth every 7 (seven) days. Takes on Mondays. Take with a full glass of water on an empty stomach.     amLODipine 10 MG tablet  Commonly known as:  NORVASC  Take 10 mg by mouth daily.     aspirin EC 81 MG tablet  Take 81 mg by mouth daily.     atorvastatin 10 MG tablet  Commonly known as:  LIPITOR  Take 5 mg by mouth daily. Take half a tab to equal 5mg      calcium-vitamin D 500-200 MG-UNIT per tablet  Take 1 tablet by mouth daily.     cholecalciferol 400 UNITS Tabs tablet  Commonly known as:  VITAMIN D  Take 400 Units by mouth daily.     divalproex 125 MG capsule  Commonly known as:  DEPAKOTE SPRINKLE  Take 250 mg by mouth 2 (two) times daily.     donepezil 10 MG tablet  Commonly known as:  ARICEPT  Take 10 mg by mouth at bedtime as needed (for memory).     DULoxetine 60 MG capsule  Commonly known as:  CYMBALTA  Take 60 mg by mouth daily.     ferrous sulfate 325 (65  FE) MG tablet  Take 325 mg by mouth daily with breakfast.     haloperidol 2 MG tablet  Commonly known as:  HALDOL  Take 2 mg by mouth every 6 (six) hours as needed (as needed for agitation).     lisinopril 10 MG tablet  Commonly known as:  PRINIVIL,ZESTRIL  Take 10 mg by mouth daily.     metoprolol 50 MG tablet  Commonly known as:  LOPRESSOR  Take 50 mg by mouth 2 (two) times daily.     multivitamin with minerals Tabs tablet  Take 1 tablet by mouth daily.     risperiDONE 0.25 MG tablet  Commonly known as:  RISPERDAL  Take 0.25 mg by mouth at bedtime.     sennosides-docusate sodium 8.6-50 MG tablet  Commonly known as:  SENOKOT-S  Take 1 tablet by mouth daily.     sucralfate 1 GM/10ML suspension  Commonly known as:  CARAFATE  Take 1 g by mouth 4 (four) times daily -  before meals and at bedtime.     vitamin B-12 1000 MCG tablet  Commonly known as:  CYANOCOBALAMIN   Take 1,000 mcg by mouth every other day.        Meds ordered this encounter  Medications  . risperiDONE (RISPERDAL) 0.25 MG tablet    Sig: Take 0.25 mg by mouth at bedtime.    Immunization History  Administered Date(s) Administered  . Influenza Whole 10/22/2004  . PPD Test 08/03/2009  . Pneumococcal Polysaccharide 01/23/1999    History  Substance Use Topics  . Smoking status: Former Smoker    Types: Cigarettes  . Smokeless tobacco: Never Used  . Alcohol Use: No    Review of Systems  DATA OBTAINED: from patient; PT HAS DEMENTIA AND HAS NO C/O EXCEPT SHE IS COLD SO i GAVE HER A BLANKET; NURSES DO NOT REPORT PROBLEMS GENERAL: Feels well no fevers, fatigue, appetite changes SKIN: No itching, rash HEENT: No complaint RESPIRATORY: No cough, wheezing, SOB CARDIAC: No chest pain, palpitations, lower extremity edema  GI: No abdominal pain, No N/V/D or constipation, No heartburn or reflux  GU: No dysuria, frequency or urgency, or incontinence  MUSCULOSKELETAL: No unrelieved bone/joint pain NEUROLOGIC: No headache, dizziness or focal weakness PSYCHIATRIC: No overt anxiety or sadness. Sleeps well.   Filed Vitals:   11/20/12 1229  BP: 140/70  Pulse: 80  Temp: 97.9 F (36.6 C)  Resp: 18    Physical Exam  GENERAL APPEARANCE: Alert, conversant. Appropriately groomed. No acute distress  SKIN: No diaphoresis rash, or wounds HEENT: Unremarkable RESPIRATORY: Breathing is even, unlabored. Lung sounds are clear   CARDIOVASCULAR: Heart RRR no murmurs, rubs or gallops. No peripheral edema  GASTROINTESTINAL: Abdomen is soft, non-tender, not distended w/ normal bowel sounds.  GENITOURINARY: Bladder non tender, not distended  MUSCULOSKELETAL: No abnormal joints or musculature NEUROLOGIC: Cranial nerves 2-12 grossly intact. Moves all extremities no tremor. PSYCHIATRIC: DEMENTIA AFFECT, PLEASANT, LOOKS COMFORTable, no behavioral issues  Patient Active Problem List   Diagnosis Date  Noted  . Osteoporosis, unspecified   . Bradycardia 09/18/2012  . Altered mental status 09/17/2012  . Hypotension 09/17/2012  . HLD (hyperlipidemia)   . Generalized weakness 09/09/2012  . Recurrent falls 09/09/2012  . Anemia 09/09/2012  . Anemia, iron deficiency 04/16/2012  . Sepsis 04/06/2012  . Acute respiratory distress 04/06/2012  . Fever 04/06/2012  . Dehydration 04/06/2012  . Sinus tachycardia 04/06/2012  . Depression 04/06/2012  . CAD (coronary artery disease) 04/06/2012  . HCAP (healthcare-associated  pneumonia) 04/06/2012  . Encephalopathy acute 12/28/2011  . Dementia of the Alzheimer's type, with late onset, with delusions 12/28/2011  . Dizziness 12/24/2011  . HTN (hypertension) 12/24/2011  . Dysphagia 08/07/2011  . GERD (gastroesophageal reflux disease) 08/07/2011    CBC  10/31/2012    WBC  7.6  10.4/30.8  PLT 210    Component Value Date/Time   WBC 8.6 10/20/2012 0335   RBC 3.79* 10/20/2012 0335   HGB 11.2* 10/20/2012 0335   HCT 33.7* 10/20/2012 0335   PLT 189 10/20/2012 0335   MCV 88.9 10/20/2012 0335   LYMPHSABS 3.0 10/20/2012 0335   MONOABS 0.7 10/20/2012 0335   EOSABS 0.2 10/20/2012 0335   BASOSABS 0.0 10/20/2012 0335    CMP 10/31/2012  143, 4.1, 108, 28, 80, 21, 0.68, nl ex protein 5.7   Assessment and Plan  Dementia of the Alzheimer's type, with late onset, with delusions Stable on her current meds, both aricept and antipsychotics; she is not somnalent and is very pleasant  HTN (hypertension) Controlled on current meds  GERD (gastroesophageal reflux disease) On carafate with out problems  Anemia, iron deficiency Most recent 10.4/30.8 is stable from prior; pt is on B12 and iron  HLD (hyperlipidemia) Pt is on lipitor;last LDL 2 months ago was 78  Depression Pt was pleasant and did not appear sad today  Recurrent falls Pt fell again 9/29 and was eval at ED;hand contusions have resolved and scalp lac is healed  CAD (coronary artery disease) No  episodes or c/o CP, SOB, weakness  Osteoporosis, unspecified Pt on fosamax, calcium and vit D-even with fall have had no fractures recently    Margit Hanks, MD

## 2012-11-20 NOTE — Assessment & Plan Note (Signed)
Pt fell again 9/29 and was eval at ED;hand contusions have resolved and scalp lac is healed

## 2012-11-20 NOTE — Assessment & Plan Note (Signed)
Most recent 10.4/30.8 is stable from prior; pt is on B12 and iron

## 2012-11-20 NOTE — Assessment & Plan Note (Signed)
On carafate with out problems

## 2012-11-20 NOTE — Assessment & Plan Note (Signed)
Pt is on lipitor;last LDL 2 months ago was 78

## 2012-11-20 NOTE — Assessment & Plan Note (Signed)
Pt was pleasant and did not appear sad today

## 2012-11-20 NOTE — Assessment & Plan Note (Signed)
No episodes or c/o CP, SOB, weakness

## 2012-11-20 NOTE — Assessment & Plan Note (Signed)
Controlled on current meds.

## 2012-11-27 NOTE — Progress Notes (Signed)
Patient ID: [name], female   DOB: [birth year], 79 y.o.   MRN: [id number]  [facility]  Allergies  Allergen Reactions  . Captopril-Hydrochlorothiazide Other (See Comments)    Reaction unknown  . Niacin And Related Other (See Comments)    Reaction unknown  . Urispas [Flavoxate] Other (See Comments)    Reaction unknown  . Verapamil Other (See Comments)    Reaction unknown    Chief Complaint  Patient presents with  . Medical Managment of Chronic Issues    HPI She is being seen for the management of her chronic illnesses. Overall her recent status has remained unchanged, there are no concerns being voiced by the nursing staff.she is unable to fully participate in the hpi or ros.   Past Medical History  Diagnosis Date  . Diverticulosis of colon (without mention of hemorrhage) 06/24/2006  . Carcinoma of cecum 1992    History of  . Vitamin B12 deficiency   . Alzheimer's disease   . HTN (hypertension)   . Depression   . PVD (peripheral vascular disease)   . CAD (coronary artery disease)   . GERD (gastroesophageal reflux disease)   . H. pylori infection 2004  . Presbyesophagus   . Iron deficiency anemia, unspecified 01/02/2012  . Personal history of fall 01/04/2009  . Unspecified constipation   . Hemorrhage of rectum and anus   . Chest pain, unspecified   . Acute pharyngitis   . Malignant neoplasm of colon, unspecified site   . Edema   . Cataract   . HLD (hyperlipidemia)   . Osteoporosis, unspecified     Past Surgical History  Procedure Laterality Date  . Gallbladder surgery    . Abdominal hysterectomy  1995    with bso  . Right colectomy    . Cataract extraction  2001  . Cholecystectomy Right     Filed Vitals:   06/03/12 1327  BP: 123/69  Pulse: 70  Height: 5' (1.524 m)  Weight: 130 lb (58.968 kg)    MEDICATIONS  Tylenol 650 mg three times daily norvasc 10 mg daily Asa 81 mg daily Ativan 0.25 mg three times daily Ca++ 600/200 twice daily carafate  1 gm four times daily Vit d 400 units daily Vit b 1000 mcg daily cymbalta 60 mg daily mvi daily depakote sprinkles 250 mg twice daily Iron daily Fosamax 70 mg weekly hctz 12.5 mg daily lipitor 5 mg daily Lisinopril 10 mg daily Lopressor 50 mg twice daily  LABS REVIEWED:  04-21-12: glucose 71; bun 19; creat 0.79; k+4.4; na++ 144    Review of Systems  Unable to perform ROS  Physical Exam  Constitutional:  thin  Neck: Neck supple. No JVD present.  Cardiovascular: Normal rate, regular rhythm and intact distal pulses.   Respiratory: Effort normal and breath sounds normal. No respiratory distress. She has no wheezes.  GI: Soft. Bowel sounds are normal. She exhibits no distension. There is no tenderness.  Musculoskeletal: She exhibits no edema.  Is able to move extremities   Neurological: She is alert.  Skin: Skin is warm and dry.      ASSESSMENT/PLAN  1. Hypertension: is stable will continue hctz 12.5 mg daily lisinopril 10 mg daily' lopressor 50 mg twice daily; norvasc 10 mg daily; asa 81 mg daily   2. Cad: is stable no chest pain present will continue asa 81 mg daily  3. Anemia: is stable will continue iron daily  4. Osteoporosis: will continue fosamax weekly with ca++/d twice daily  5. Dyslipidemia: continue lipitor 5 mg daily  6. Gerd: will continue carafate 1 gm four times daily  7. Dementia with delusions: no significant change in status; will continue depakote sprinkles 250 mg twice daily to help stabilize mood and will continue to monitor her status   8. Depression/anxiety: she is emotionally stable will continue cymbalta 60 mg daily and will continue ativan 0.25 mg three times daily to help with anxiety

## 2012-12-24 ENCOUNTER — Non-Acute Institutional Stay (SKILLED_NURSING_FACILITY): Payer: Medicare Other | Admitting: Internal Medicine

## 2012-12-24 DIAGNOSIS — F02818 Dementia in other diseases classified elsewhere, unspecified severity, with other behavioral disturbance: Secondary | ICD-10-CM

## 2012-12-24 DIAGNOSIS — F0390 Unspecified dementia without behavioral disturbance: Secondary | ICD-10-CM

## 2012-12-24 DIAGNOSIS — F22 Delusional disorders: Secondary | ICD-10-CM

## 2012-12-24 DIAGNOSIS — M810 Age-related osteoporosis without current pathological fracture: Secondary | ICD-10-CM

## 2012-12-24 DIAGNOSIS — F32A Depression, unspecified: Secondary | ICD-10-CM

## 2012-12-24 DIAGNOSIS — I10 Essential (primary) hypertension: Secondary | ICD-10-CM

## 2012-12-24 DIAGNOSIS — Z9181 History of falling: Secondary | ICD-10-CM

## 2012-12-24 DIAGNOSIS — F0281 Dementia in other diseases classified elsewhere with behavioral disturbance: Secondary | ICD-10-CM

## 2012-12-24 DIAGNOSIS — F0392 Unspecified dementia, unspecified severity, with psychotic disturbance: Secondary | ICD-10-CM

## 2012-12-24 DIAGNOSIS — F329 Major depressive disorder, single episode, unspecified: Secondary | ICD-10-CM

## 2012-12-24 DIAGNOSIS — R296 Repeated falls: Secondary | ICD-10-CM

## 2012-12-24 DIAGNOSIS — D509 Iron deficiency anemia, unspecified: Secondary | ICD-10-CM

## 2012-12-24 DIAGNOSIS — G301 Alzheimer's disease with late onset: Secondary | ICD-10-CM

## 2012-12-24 DIAGNOSIS — F3289 Other specified depressive episodes: Secondary | ICD-10-CM

## 2012-12-24 NOTE — Progress Notes (Signed)
Patient ID: [name], female   DOB: May 24, 1922, 79 y.o.   MRN: [id number]  Location:  [facility] Provider:  Gwenith Spitz. Renato Gails, D.O., C.M.D.  Code Status:  DNR   Chief Complaint  Patient presents with  . Medical Managment of Chronic Issues    HPI:  79yo female here for long term care with h/o AD and frequent falling.  When seen, she had no complaints and was doing well.    Review of Systems:  Review of Systems  Constitutional: Negative for fever.  HENT: Negative for congestion.   Respiratory: Negative for shortness of breath.   Cardiovascular: Negative for chest pain.  Gastrointestinal: Negative for abdominal pain.  Genitourinary: Negative for dysuria.  Musculoskeletal: Positive for falls.  Skin: Negative for rash.  Neurological: Negative for dizziness and loss of consciousness.  Endo/Heme/Allergies: Bruises/bleeds easily.  Psychiatric/Behavioral: Positive for memory loss.    Medications: Patient's Medications  New Prescriptions   No medications on file  Previous Medications   ACETAMINOPHEN (TYLENOL) 650 MG CR TABLET    Take 650 mg by mouth 3 (three) times daily.   ALENDRONATE (FOSAMAX) 70 MG TABLET    Take 70 mg by mouth every 7 (seven) days. Takes on Mondays. Take with a full glass of water on an empty stomach.   AMLODIPINE (NORVASC) 10 MG TABLET    Take 10 mg by mouth daily.   ASPIRIN EC 81 MG TABLET    Take 81 mg by mouth daily.   ATORVASTATIN (LIPITOR) 10 MG TABLET    Take 5 mg by mouth at bedtime. Take half a tab to equal 5mg    CALCIUM CARBONATE-VITAMIN D (CALCIUM-VITAMIN D) 500-200 MG-UNIT PER TABLET    Take 1 tablet by mouth daily.   CHOLECALCIFEROL (VITAMIN D) 400 UNITS TABS TABLET    Take 400 Units by mouth daily.   DIVALPROEX (DEPAKOTE SPRINKLE) 125 MG CAPSULE    Take 250 mg by mouth 2 (two) times daily.   DONEPEZIL (ARICEPT) 10 MG TABLET    Take 10 mg by mouth at bedtime.    DULOXETINE (CYMBALTA) 60 MG CAPSULE    Take 60 mg by mouth daily.   FERROUS SULFATE 325 (65 FE) MG TABLET    Take 325 mg by mouth daily with breakfast.   LISINOPRIL (PRINIVIL,ZESTRIL) 10 MG TABLET    Take 10 mg by mouth daily.   METOPROLOL (LOPRESSOR) 50 MG TABLET    Take 50 mg by mouth 2 (two) times daily. If pulse <50 or SBP <100 hold med and notify MD.   MULTIPLE VITAMIN (MULTIVITAMIN WITH MINERALS) TABS TABLET    Take 1 tablet by mouth daily.   NUTRITIONAL SUPPLEMENTS (TWOCAL HN) LIQD    Take 90 mLs by mouth 2 (two) times daily.   RISPERIDONE (RISPERDAL) 0.25 MG TABLET    Take 0.25 mg by mouth at bedtime.   SENNOSIDES-DOCUSATE SODIUM (SENOKOT-S) 8.6-50 MG TABLET    Take 1 tablet by mouth daily as needed for constipation.    SUCRALFATE (CARAFATE) 1 GM/10ML SUSPENSION    Take 1 g by mouth 4 (four) times daily -  before meals and at bedtime.   VITAMIN B-12 (CYANOCOBALAMIN) 1000 MCG TABLET    Take 1,000 mcg by mouth every other day.  Modified Medications   No medications on file  Discontinued Medications   HALOPERIDOL (HALDOL) 2 MG TABLET    Take 2 mg by mouth every 6 (six) hours as needed (as needed for agitation).  Physical Exam: Filed Vitals:   12/24/12 1642  BP: 136/79  Pulse: 74  Temp: 97.3 F (36.3 C)  Resp: 16  SpO2: 97%   Physical Exam  Constitutional: No distress.  Cardiovascular: Normal rate, regular rhythm, normal heart sounds and intact distal pulses.   Pulmonary/Chest: Effort normal and breath sounds normal. No respiratory distress.  Abdominal: Soft. Bowel sounds are normal. She exhibits no distension and no mass. There is no tenderness.  Musculoskeletal: Normal range of motion.  Rolls self in wheelchair and frequently tries to walk  Neurological: She is alert.  Pleasant, oriented only to person  Skin: Skin is warm and dry. There is pallor.  Psychiatric: She has a normal mood and affect.    Labs reviewed: Basic Metabolic Panel:  Recent Labs  16/10/96 1725  04/08/12 0646  09/10/12 0215 09/17/12 1450 10/20/12 0335  NA  --    < > 138  < > 141 142 142  K  --   < > 3.5  < > 3.8 4.0 3.8  CL  --   < > 106  < > 105 108 104  CO2  --   < > 23  < > 25 27 29   GLUCOSE  --   < > 78  < > 78 85 79  BUN  --   < > 13  < > 25* 27* 21  CREATININE  --   < > 0.65  < > 0.75 0.81 0.65  CALCIUM  --   < > 8.7  < > 9.3 8.7 9.4  MG 1.9  --  2.0  --   --   --   --   < > = values in this interval not displayed.  Liver Function Tests:  Recent Labs  04/06/12 1430 04/07/12 0202 09/17/12 1601  AST 61* 45* 27  ALT 41* 39* 13  ALKPHOS 69 52 56  BILITOT 0.8 0.7 0.4  PROT 6.9 5.4* 6.5  ALBUMIN 3.7 2.7* 3.0*    CBC:  Recent Labs  04/07/12 0202  09/09/12 0954  09/10/12 0215 09/17/12 1450 10/20/12 0335  WBC 9.7  < > 8.0  < > 7.5 5.7 8.6  NEUTROABS 7.9*  --  4.8  --   --   --  4.7  HGB 9.8*  < > 11.3*  < > 10.8* 10.0* 11.2*  HCT 30.1*  < > 34.3*  < > 31.8* 30.5* 33.7*  MCV 85.8  < > 89.1  < > 88.6 90.2 88.9  PLT 131*  < > 196  < > 197 164 189  < > = values in this interval not displayed.  Assessment/Plan 1. Dementia of the Alzheimer's type, with late onset, with delusions -continues on aricept -interactive with environment, verbal, very pleasant   2. Recurrent falls -does better w/o benzodiazepines--try to avoid adding these--they do not calm her anyway--need to use behavioral interventions   3. HTN (hypertension) -bp satisfactory with amlodipine, lisinopril, metoprolol  4. Anemia, iron deficiency - cont iron supplement  5. Depression -cont cymbalta, risperdal  6. Senile osteoporosis -cont ca with D and fosamax as she is still trying to walk at times and high risk for fracture  Family/ staff Communication: discussed with staff avoiding ativan and the like  Goals of care: long term care resident, try to avoid hospitalizations and treat here with goal of best qol  Labs/tests ordered:  None added today--stable pt

## 2012-12-29 ENCOUNTER — Non-Acute Institutional Stay (SKILLED_NURSING_FACILITY): Payer: Medicare Other | Admitting: Nurse Practitioner

## 2012-12-29 DIAGNOSIS — J069 Acute upper respiratory infection, unspecified: Secondary | ICD-10-CM

## 2012-12-29 NOTE — Progress Notes (Signed)
Patient ID: [name], female   DOB: December 23, 1922, 79 y.o.   MRN: [id number]    Nursing Home Location:  [facility]   Place of Service: SNF (31)  PCP: [name], MD  Allergies  Allergen Reactions  . Captopril-Hydrochlorothiazide Other (See Comments)    Reaction unknown  . Niacin And Related Other (See Comments)    Reaction unknown  . Urispas [Flavoxate] Other (See Comments)    Reaction unknown  . Verapamil Other (See Comments)    Reaction unknown    Chief Complaint  Patient presents with  . Acute Visit    HPI:  79 year old female who is being seen today at the request of staff for cough and chest congestion for several days that has not improved with robitussin standing order. Pt has been afebrile; staff reports pt is sleeping during the say and staying up at night. Pt unable to provide information for HPI or ROS due to falling asleep during exam; pt is easily woken up but does not stay awake for long   Review of Systems:  Unable to obtain  Past Medical History  Diagnosis Date  . Diverticulosis of colon (without mention of hemorrhage) 06/24/2006  . Carcinoma of cecum 1992    History of  . Vitamin B12 deficiency   . Alzheimer's disease   . HTN (hypertension)   . Depression   . PVD (peripheral vascular disease)   . CAD (coronary artery disease)   . GERD (gastroesophageal reflux disease)   . H. pylori infection 2004  . Presbyesophagus   . Iron deficiency anemia, unspecified 01/02/2012  . Personal history of fall 01/04/2009  . Unspecified constipation   . Hemorrhage of rectum and anus   . Chest pain, unspecified   . Acute pharyngitis   . Malignant neoplasm of colon, unspecified site   . Edema   . Cataract   . HLD (hyperlipidemia)   . Osteoporosis, unspecified    Past Surgical History  Procedure Laterality Date  . Gallbladder surgery    . Abdominal hysterectomy  1995    with bso  . Right colectomy    . Cataract extraction  2001  .  Cholecystectomy Right    Social History:   reports that she has quit smoking. Her smoking use included Cigarettes. She smoked 0.00 packs per day. She has never used smokeless tobacco. She reports that she does not drink alcohol or use illicit drugs.  Family History  Problem Relation Age of Onset  . Heart disease Mother   . Colon cancer Neg Hx     Medications: Patient's Medications  New Prescriptions   No medications on file  Previous Medications   ACETAMINOPHEN (TYLENOL) 650 MG CR TABLET    Take 650 mg by mouth 3 (three) times daily.   ALENDRONATE (FOSAMAX) 70 MG TABLET    Take 70 mg by mouth every 7 (seven) days. Takes on Mondays. Take with a full glass of water on an empty stomach.   AMLODIPINE (NORVASC) 10 MG TABLET    Take 10 mg by mouth daily.   ASPIRIN EC 81 MG TABLET    Take 81 mg by mouth daily.   ATORVASTATIN (LIPITOR) 10 MG TABLET    Take 5 mg by mouth daily. Take half a tab to equal 5mg    CALCIUM CARBONATE-VITAMIN D (CALCIUM-VITAMIN D) 500-200 MG-UNIT PER TABLET    Take 1 tablet by mouth daily.   CHOLECALCIFEROL (VITAMIN D) 400 UNITS TABS TABLET  Take 400 Units by mouth daily.   DIVALPROEX (DEPAKOTE SPRINKLE) 125 MG CAPSULE    Take 250 mg by mouth 2 (two) times daily.   DONEPEZIL (ARICEPT) 10 MG TABLET    Take 10 mg by mouth at bedtime as needed (for memory).   DULOXETINE (CYMBALTA) 60 MG CAPSULE    Take 60 mg by mouth daily.   FERROUS SULFATE 325 (65 FE) MG TABLET    Take 325 mg by mouth daily with breakfast.   HALOPERIDOL (HALDOL) 2 MG TABLET    Take 2 mg by mouth every 6 (six) hours as needed (as needed for agitation).   LISINOPRIL (PRINIVIL,ZESTRIL) 10 MG TABLET    Take 10 mg by mouth daily.   METOPROLOL (LOPRESSOR) 50 MG TABLET    Take 50 mg by mouth 2 (two) times daily.   MULTIPLE VITAMIN (MULTIVITAMIN WITH MINERALS) TABS TABLET    Take 1 tablet by mouth daily.   RISPERIDONE (RISPERDAL) 0.25 MG TABLET    Take 0.25 mg by mouth at bedtime.   SENNOSIDES-DOCUSATE SODIUM  (SENOKOT-S) 8.6-50 MG TABLET    Take 1 tablet by mouth daily.   SUCRALFATE (CARAFATE) 1 GM/10ML SUSPENSION    Take 1 g by mouth 4 (four) times daily -  before meals and at bedtime.   VITAMIN B-12 (CYANOCOBALAMIN) 1000 MCG TABLET    Take 1,000 mcg by mouth every other day.  Modified Medications   No medications on file  Discontinued Medications   No medications on file     Physical Exam: Physical Exam  Vitals reviewed. Constitutional: No distress.  HENT:  Nose: Rhinorrhea present.  Cardiovascular: Normal rate, regular rhythm and normal heart sounds.   Pulmonary/Chest: Effort normal. No respiratory distress. She has no wheezes.  Diminished throughout   Abdominal: Soft. Bowel sounds are normal. She exhibits no distension. There is no tenderness.  Musculoskeletal: She exhibits no edema and no tenderness.  Skin: Skin is warm and dry. She is not diaphoretic.  Psychiatric:  lethargic      Filed Vitals:   12/29/12 1503  BP: 126/74  Pulse: 78  Temp: 98.6 F (37 C)  Resp: 18  pulse Ox 91 % room air     Labs reviewed: Basic Metabolic Panel:  Recent Labs  47/82/95 1725  04/08/12 0646  09/10/12 0215 09/17/12 1450 10/20/12 0335  NA  --   < > 138  < > 141 142 142  K  --   < > 3.5  < > 3.8 4.0 3.8  CL  --   < > 106  < > 105 108 104  CO2  --   < > 23  < > 25 27 29   GLUCOSE  --   < > 78  < > 78 85 79  BUN  --   < > 13  < > 25* 27* 21  CREATININE  --   < > 0.65  < > 0.75 0.81 0.65  CALCIUM  --   < > 8.7  < > 9.3 8.7 9.4  MG 1.9  --  2.0  --   --   --   --   < > = values in this interval not displayed. Liver Function Tests:  Recent Labs  04/06/12 1430 04/07/12 0202 09/17/12 1601  AST 61* 45* 27  ALT 41* 39* 13  ALKPHOS 69 52 56  BILITOT 0.8 0.7 0.4  PROT 6.9 5.4* 6.5  ALBUMIN 3.7 2.7* 3.0*    Recent Labs  09/17/12 1601  LIPASE 23    Recent Labs  09/17/12 1601  AMMONIA 60   CBC:  Recent Labs  04/07/12 0202  09/09/12 0954  09/10/12 0215 09/17/12 1450  10/20/12 0335  WBC 9.7  < > 8.0  < > 7.5 5.7 8.6  NEUTROABS 7.9*  --  4.8  --   --   --  4.7  HGB 9.8*  < > 11.3*  < > 10.8* 10.0* 11.2*  HCT 30.1*  < > 34.3*  < > 31.8* 30.5* 33.7*  MCV 85.8  < > 89.1  < > 88.6 90.2 88.9  PLT 131*  < > 196  < > 197 164 189  < > = values in this interval not displayed. Cardiac Enzymes:  Recent Labs  09/09/12 2011 09/10/12 0215 09/17/12 1601  TROPONINI <0.30 <0.30 <0.30   BNP: No components found with this basename: POCBNP,  CBG:  Recent Labs  09/17/12 1447  GLUCAP 76   TSH:  Recent Labs  09/09/12 1515  TSH 1.856   A1C: Lab Results  Component Value Date   HGBA1C 5.7* 09/10/2012   Lipid Panel:  Recent Labs  09/10/12 0215  CHOL 142  HDL 43  LDLCALC 78  TRIG 104  CHOLHDL 3.3    Assessment/Plan 1. Cough and congestion -Will get chest xray today with cbc and bmp -encourage fluids -mucinex DM PO BID for 7 days -duo nebs q 6 hours x 72 hour -vs q shift with pulse ox

## 2013-02-06 ENCOUNTER — Encounter: Payer: Self-pay | Admitting: Internal Medicine

## 2013-02-06 DIAGNOSIS — M810 Age-related osteoporosis without current pathological fracture: Secondary | ICD-10-CM | POA: Insufficient documentation

## 2013-02-08 ENCOUNTER — Encounter: Payer: Self-pay | Admitting: Internal Medicine

## 2013-02-12 ENCOUNTER — Encounter (HOSPITAL_COMMUNITY): Payer: Self-pay | Admitting: Emergency Medicine

## 2013-02-12 ENCOUNTER — Emergency Department (HOSPITAL_COMMUNITY): Payer: Medicare Other

## 2013-02-12 ENCOUNTER — Emergency Department (HOSPITAL_COMMUNITY)
Admission: EM | Admit: 2013-02-12 | Discharge: 2013-02-12 | Disposition: A | Discharge status: Skilled Nursing Facility | Payer: Medicare Other | Attending: Emergency Medicine | Admitting: Emergency Medicine

## 2013-02-12 DIAGNOSIS — F0280 Dementia in other diseases classified elsewhere without behavioral disturbance: Secondary | ICD-10-CM | POA: Insufficient documentation

## 2013-02-12 DIAGNOSIS — D509 Iron deficiency anemia, unspecified: Secondary | ICD-10-CM | POA: Insufficient documentation

## 2013-02-12 DIAGNOSIS — F3289 Other specified depressive episodes: Secondary | ICD-10-CM | POA: Insufficient documentation

## 2013-02-12 DIAGNOSIS — E785 Hyperlipidemia, unspecified: Secondary | ICD-10-CM | POA: Insufficient documentation

## 2013-02-12 DIAGNOSIS — I739 Peripheral vascular disease, unspecified: Secondary | ICD-10-CM | POA: Insufficient documentation

## 2013-02-12 DIAGNOSIS — Z8509 Personal history of malignant neoplasm of other digestive organs: Secondary | ICD-10-CM | POA: Insufficient documentation

## 2013-02-12 DIAGNOSIS — Z87891 Personal history of nicotine dependence: Secondary | ICD-10-CM | POA: Insufficient documentation

## 2013-02-12 DIAGNOSIS — S0003XA Contusion of scalp, initial encounter: Secondary | ICD-10-CM | POA: Insufficient documentation

## 2013-02-12 DIAGNOSIS — Y9210 Unspecified residential institution as the place of occurrence of the external cause: Secondary | ICD-10-CM | POA: Insufficient documentation

## 2013-02-12 DIAGNOSIS — I2510 Atherosclerotic heart disease of native coronary artery without angina pectoris: Secondary | ICD-10-CM | POA: Insufficient documentation

## 2013-02-12 DIAGNOSIS — K219 Gastro-esophageal reflux disease without esophagitis: Secondary | ICD-10-CM | POA: Insufficient documentation

## 2013-02-12 DIAGNOSIS — E538 Deficiency of other specified B group vitamins: Secondary | ICD-10-CM | POA: Insufficient documentation

## 2013-02-12 DIAGNOSIS — F329 Major depressive disorder, single episode, unspecified: Secondary | ICD-10-CM | POA: Insufficient documentation

## 2013-02-12 DIAGNOSIS — S1093XA Contusion of unspecified part of neck, initial encounter: Principal | ICD-10-CM

## 2013-02-12 DIAGNOSIS — Y9383 Activity, rough housing and horseplay: Secondary | ICD-10-CM | POA: Insufficient documentation

## 2013-02-12 DIAGNOSIS — G309 Alzheimer's disease, unspecified: Secondary | ICD-10-CM | POA: Insufficient documentation

## 2013-02-12 DIAGNOSIS — W19XXXA Unspecified fall, initial encounter: Secondary | ICD-10-CM | POA: Insufficient documentation

## 2013-02-12 DIAGNOSIS — M810 Age-related osteoporosis without current pathological fracture: Secondary | ICD-10-CM | POA: Insufficient documentation

## 2013-02-12 DIAGNOSIS — I10 Essential (primary) hypertension: Secondary | ICD-10-CM | POA: Insufficient documentation

## 2013-02-12 DIAGNOSIS — Z79899 Other long term (current) drug therapy: Secondary | ICD-10-CM | POA: Insufficient documentation

## 2013-02-12 DIAGNOSIS — S0083XA Contusion of other part of head, initial encounter: Principal | ICD-10-CM

## 2013-02-12 DIAGNOSIS — S0990XA Unspecified injury of head, initial encounter: Secondary | ICD-10-CM

## 2013-02-12 NOTE — ED Notes (Signed)
Per EMS pt comes from [facility] for un witnessed fall that occurred around 0300 this morning. Pt has hematoma to right upper eye, but no other complaints. Per facility there policy to send pt after fall for eval. Pt does have dementia.

## 2013-02-12 NOTE — ED Notes (Signed)
Bed: WA02 Expected date:  Expected time:  Means of arrival:  Comments: EMS- unwitnessed fall

## 2013-02-12 NOTE — ED Provider Notes (Signed)
TIME SEEN: 2:32 PM  CHIEF COMPLAINT: Unwitnessed fall  HPI: Patient is an 80 year old female with a history of Alzheimer's dementia, hypertension, CAD, peripheral vascular disease, hyperlipidemia who presents to the emergency department from [facility] [phone]) after an unwitnessed fall that occurred at 3:00 this morning. Per nursing home staff, pt uses wheelchair at baseline and they state that she frequently tries to get up and walk. He took her to the bathroom early this morning and when staff came back to get her off the toilet she had gone off the toilet and was found on the floor. She has been at her neurologic baseline. She is not on anticoagulation. No recent infectious symptoms such as cough, fever, vomiting or diarrhea.  ROS: Level V caveat for dementia  PAST MEDICAL HISTORY/PAST SURGICAL HISTORY:  Past Medical History  Diagnosis Date  . Diverticulosis of colon (without mention of hemorrhage) 06/24/2006  . Carcinoma of cecum 1992    History of  . Vitamin B12 deficiency   . Alzheimer's disease   . HTN (hypertension)   . Depression   . PVD (peripheral vascular disease)   . CAD (coronary artery disease)   . GERD (gastroesophageal reflux disease)   . H. pylori infection 2004  . Presbyesophagus   . Iron deficiency anemia, unspecified 01/02/2012  . Personal history of fall 01/04/2009  . Unspecified constipation   . Hemorrhage of rectum and anus   . Chest pain, unspecified   . Acute pharyngitis   . Malignant neoplasm of colon, unspecified site   . Edema   . Cataract   . HLD (hyperlipidemia)   . Osteoporosis, unspecified     MEDICATIONS:  Prior to Admission medications   Medication Sig Start Date End Date Taking? Authorizing Provider  acetaminophen (TYLENOL) 650 MG CR tablet Take 650 mg by mouth 3 (three) times daily.   Yes Historical Provider, MD  alendronate (FOSAMAX) 70 MG tablet Take 70 mg by mouth every 7 (seven) days. Takes on Mondays. Take with a full glass  of water on an empty stomach.   Yes Historical Provider, MD  amLODipine (NORVASC) 10 MG tablet Take 10 mg by mouth daily.   Yes Historical Provider, MD  aspirin EC 81 MG tablet Take 81 mg by mouth daily.   Yes Historical Provider, MD  atorvastatin (LIPITOR) 10 MG tablet Take 5 mg by mouth daily. Take half a tab to equal 5mg    Yes Historical Provider, MD  Calcium Carbonate-Vitamin D (CALCIUM-VITAMIN D) 500-200 MG-UNIT per tablet Take 1 tablet by mouth daily.   Yes Historical Provider, MD  cholecalciferol (VITAMIN D) 400 UNITS TABS tablet Take 400 Units by mouth daily.   Yes Historical Provider, MD  divalproex (DEPAKOTE SPRINKLE) 125 MG capsule Take 250 mg by mouth 2 (two) times daily.   Yes Historical Provider, MD  donepezil (ARICEPT) 10 MG tablet Take 10 mg by mouth at bedtime as needed (for memory).   Yes Historical Provider, MD  DULoxetine (CYMBALTA) 60 MG capsule Take 60 mg by mouth daily.   Yes Historical Provider, MD  ferrous sulfate 325 (65 FE) MG tablet Take 325 mg by mouth daily with breakfast.   Yes Historical Provider, MD  lisinopril (PRINIVIL,ZESTRIL) 10 MG tablet Take 10 mg by mouth daily.   Yes Historical Provider, MD  metoprolol (LOPRESSOR) 50 MG tablet Take 50 mg by mouth 2 (two) times daily.   Yes Historical Provider, MD  Multiple Vitamin (MULTIVITAMIN WITH MINERALS) TABS tablet Take 1 tablet by mouth daily.  Yes Historical Provider, MD  risperiDONE (RISPERDAL) 0.25 MG tablet Take 0.25 mg by mouth at bedtime.   Yes Historical Provider, MD  sennosides-docusate sodium (SENOKOT-S) 8.6-50 MG tablet Take 1 tablet by mouth daily.   Yes Historical Provider, MD  sucralfate (CARAFATE) 1 GM/10ML suspension Take 1 g by mouth 4 (four) times daily -  before meals and at bedtime.   Yes Historical Provider, MD  vitamin B-12 (CYANOCOBALAMIN) 1000 MCG tablet Take 1,000 mcg by mouth every other day.   Yes Historical Provider, MD    ALLERGIES:  Allergies  Allergen Reactions  .  Captopril-Hydrochlorothiazide Other (See Comments)    Reaction unknown  . Niacin And Related Other (See Comments)    Reaction unknown  . Urispas [Flavoxate] Other (See Comments)    Reaction unknown  . Verapamil Other (See Comments)    Reaction unknown    SOCIAL HISTORY:  History  Substance Use Topics  . Smoking status: Former Smoker    Types: Cigarettes  . Smokeless tobacco: Never Used  . Alcohol Use: No    FAMILY HISTORY: Family History  Problem Relation Age of Onset  . Heart disease Mother   . Colon cancer Neg Hx     EXAM: BP 158/87  Pulse 52  Temp(Src) 97.6 F (36.4 C) (Oral)  Resp 16  SpO2 95% CONSTITUTIONAL: Alert and oriented to person only.  Does not respond to questions appropriately or follow commands. Well-appearing; well-nourished; NAD HEAD: Normocephalic EYES: Conjunctivae clear, PERRL ENT: normal nose; no rhinorrhea; moist mucous membranes; pharynx without lesions noted NECK: Supple, no meningismus, no LAD  CARD: RRR; S1 and S2 appreciated; no murmurs, no clicks, no rubs, no gallops RESP: Normal chest excursion without splinting or tachypnea; breath sounds clear and equal bilaterally; no wheezes, no rhonchi, no rales,  ABD/GI: Normal bowel sounds; non-distended; soft, non-tender, no rebound, no guarding BACK:  The back appears normal and is non-tender to palpation, there is no CVA tenderness EXT: Normal ROM in all joints; non-tender to palpation; no edema; normal capillary refill; no cyanosis    SKIN: Normal color for age and race; warm NEURO: Moves all extremities equally; no facial droop, no slurred speech, patient is at her baseline neurologically PSYCH: The patient's mood and manner are appropriate. Grooming and personal hygiene are appropriate.  MEDICAL DECISION MAKING: Patient with unwitnessed fall. She appears to be in her neurologic baseline. She is hemodynamically stable. Will check CT head and cervical spine. No obvious signs of trauma on exam. If  workup negative, will discharge back to nursing facility.  Given patient has frequent falls and likely got up from the toilet and fell as this is normal for her, I do not feel she needs labs her urine at this time.  ED PROGRESS: CT scans showed no acute abnormality. We'll discharge patient back to nursing facility. Patient is still stable   EKG Interpretation    Date/Time:  Thursday February 12 2013 14:09:57 EST Ventricular Rate:  52 PR Interval:  196 QRS Duration: 81 QT Interval:  516 QTC Calculation: 480 R Axis:     Text Interpretation:  Sinus rhythm Consider anterior infarct Nonspecific T abnormalities, lateral leads Baseline wander in lead(s) II III aVF No significant change since last tracing Confirmed by GOLDSTON  MD, SCOTT ([id number]) on 02/12/2013 2:25:59 PM             [place], DO 02/12/13 1531

## 2013-02-12 NOTE — ED Notes (Signed)
[facility] notified about transport back to facility.  

## 2013-02-12 NOTE — Discharge Instructions (Signed)
Head Injury, Adult  You have received a head injury. It does not appear serious at this time. Headaches and vomiting are common following head injury. It should be easy to awaken from sleeping. Sometimes it is necessary for you to stay in the emergency department for a while for observation. Sometimes admission to the hospital may be needed. After injuries such as yours, most problems occur within the first 24 hours, but side effects may occur up to 7 10 days after the injury. It is important for you to carefully monitor your condition and contact your health care provider or seek immediate medical care if there is a change in your condition.  WHAT ARE THE TYPES OF HEAD INJURIES?  Head injuries can be as minor as a bump. Some head injuries can be more severe. More severe head injuries include:  · A jarring injury to the brain (concussion).  · A bruise of the brain (contusion). This mean there is bleeding in the brain that can cause swelling.  · A cracked skull (skull fracture).  · Bleeding in the brain that collects, clots, and forms a bump (hematoma).  WHAT CAUSES A HEAD INJURY?  A serious head injury is most likely to happen to someone who is in a car wreck and is not wearing a seat belt. Other causes of major head injuries include bicycle or motorcycle accidents, sports injuries, and falls.  HOW ARE HEAD INJURIES DIAGNOSED?  A complete history of the event leading to the injury and your current symptoms will be helpful in diagnosing head injuries. Many times, pictures of the brain, such as CT or MRI are needed to see the extent of the injury. Often, an overnight hospital stay is necessary for observation.   WHEN SHOULD I SEEK IMMEDIATE MEDICAL CARE?   You should get help right away if:  · You have confusion or drowsiness.  · You feel sick to your stomach (nauseous) or have continued, forceful vomiting.  · You have dizziness or unsteadiness that is getting worse.  · You have severe, continued headaches not  relieved by medicine. Only take over-the-counter or prescription medicines for pain, fever, or discomfort as directed by your health care provider.  · You do not have normal function of the arms or legs or are unable to walk.  · You notice changes in the black spots in the center of the colored part of your eye (pupil).  · You have a clear or bloody fluid coming from your nose or ears.  · You have a loss of vision.  During the next 24 hours after the injury, you must stay with someone who can watch you for the warning signs. This person should contact local emergency services (911 in the [place] if you have seizures, you become unconscious, or you are unable to wake up.  HOW CAN I PREVENT A HEAD INJURY IN THE FUTURE?  The most important factor for preventing major head injuries is avoiding motor vehicle accidents.  To minimize the potential for damage to your head, it is crucial to wear seat belts while riding in motor vehicles. Wearing helmets while bike riding and playing collision sports (like football) is also helpful. Also, avoiding dangerous activities around the house will further help reduce your risk of head injury.   WHEN CAN I RETURN TO NORMAL ACTIVITIES AND ATHLETICS?  You should be reevaluated by your health care provider before returning to these activities. If you have any of the following symptoms, you should not return   to activities or contact sports until 1 week after the symptoms have stopped:  · Persistent headache.  · Dizziness or vertigo.  · Poor attention and concentration.  · Confusion.  · Memory problems.  · Nausea or vomiting.  · Fatigue or tire easily.  · Irritability.  · Intolerant of bright lights or loud noises.  · Anxiety or depression.  · Disturbed sleep.  MAKE SURE YOU:   · Understand these instructions.  · Will watch your condition.  · Will get help right away if you are not doing well or get worse.  Document Released: 01/08/2005 Document Revised: 10/29/2012 Document Reviewed:  09/15/2012  [employer]® Patient Information ©2014 [employer].

## 2013-02-18 ENCOUNTER — Non-Acute Institutional Stay (SKILLED_NURSING_FACILITY): Payer: Medicare Other | Admitting: Internal Medicine

## 2013-02-18 DIAGNOSIS — Z9181 History of falling: Secondary | ICD-10-CM

## 2013-02-18 DIAGNOSIS — F0280 Dementia in other diseases classified elsewhere without behavioral disturbance: Secondary | ICD-10-CM

## 2013-02-18 DIAGNOSIS — F0392 Unspecified dementia, unspecified severity, with psychotic disturbance: Secondary | ICD-10-CM

## 2013-02-18 DIAGNOSIS — F0390 Unspecified dementia without behavioral disturbance: Secondary | ICD-10-CM

## 2013-02-18 DIAGNOSIS — G301 Alzheimer's disease with late onset: Secondary | ICD-10-CM

## 2013-02-18 DIAGNOSIS — F0281 Dementia in other diseases classified elsewhere with behavioral disturbance: Secondary | ICD-10-CM

## 2013-02-18 DIAGNOSIS — F22 Delusional disorders: Secondary | ICD-10-CM

## 2013-02-18 DIAGNOSIS — F02818 Dementia in other diseases classified elsewhere, unspecified severity, with other behavioral disturbance: Secondary | ICD-10-CM

## 2013-02-18 DIAGNOSIS — D509 Iron deficiency anemia, unspecified: Secondary | ICD-10-CM

## 2013-02-18 DIAGNOSIS — I10 Essential (primary) hypertension: Secondary | ICD-10-CM

## 2013-02-18 DIAGNOSIS — G309 Alzheimer's disease, unspecified: Secondary | ICD-10-CM

## 2013-02-18 DIAGNOSIS — M810 Age-related osteoporosis without current pathological fracture: Secondary | ICD-10-CM

## 2013-02-18 DIAGNOSIS — R296 Repeated falls: Secondary | ICD-10-CM

## 2013-02-18 DIAGNOSIS — E538 Deficiency of other specified B group vitamins: Secondary | ICD-10-CM

## 2013-02-18 NOTE — Progress Notes (Signed)
Patient ID: Amanda MANCILLAS, female   DOB: 1922-04-05, 80 y.o.   MRN: [id number]  Location:  [facility] Provider:  Rexene Edison. Mariea Clonts, D.O., C.M.D.  Code Status:  DNR  Chief Complaint  Patient presents with  . Medical Management of Chronic Issues    HPI:  80 yo white female with late stage dementia, senile osteoporosis, iron deficiency anemia, hypertension, and b12 deficiency seen for medical mgt of chronic diseases.  She falls frequently due to getting up unassisted and being unable to safely walk anymore due to her dementia.  She had no complaints when seen.    Review of Systems:  Review of Systems  Constitutional: Negative for fever.  Eyes: Negative for blurred vision.  Respiratory: Negative for cough and shortness of breath.   Cardiovascular: Negative for chest pain and palpitations.  Gastrointestinal: Negative for abdominal pain and constipation.  Genitourinary: Negative for dysuria.  Musculoskeletal: Positive for falls.  Skin: Negative for rash.  Neurological: Positive for dizziness and sensory change. Negative for headaches.  Endo/Heme/Allergies: Bruises/bleeds easily.  Psychiatric/Behavioral: Positive for memory loss. Negative for depression.    Medications: Patient's Medications  New Prescriptions   No medications on file  Previous Medications   ACETAMINOPHEN (TYLENOL) 650 MG CR TABLET    Take 650 mg by mouth 3 (three) times daily.   ALENDRONATE (FOSAMAX) 70 MG TABLET    Take 70 mg by mouth every 7 (seven) days. Takes on Mondays. Take with a full glass of water on an empty stomach.   AMLODIPINE (NORVASC) 10 MG TABLET    Take 10 mg by mouth daily.   ASPIRIN EC 81 MG TABLET    Take 81 mg by mouth daily.   ATORVASTATIN (LIPITOR) 10 MG TABLET    Take 5 mg by mouth at bedtime. Take half a tab to equal 5mg    CALCIUM CARBONATE-VITAMIN D (CALCIUM-VITAMIN D) 500-200 MG-UNIT PER TABLET    Take 1 tablet by mouth daily.   CHOLECALCIFEROL (VITAMIN D) 400 UNITS TABS  TABLET    Take 400 Units by mouth daily.   DIVALPROEX (DEPAKOTE SPRINKLE) 125 MG CAPSULE    Take 250 mg by mouth 2 (two) times daily.   DONEPEZIL (ARICEPT) 10 MG TABLET    Take 10 mg by mouth at bedtime.    DULOXETINE (CYMBALTA) 60 MG CAPSULE    Take 60 mg by mouth daily.   FERROUS SULFATE 325 (65 FE) MG TABLET    Take 325 mg by mouth daily with breakfast.   LISINOPRIL (PRINIVIL,ZESTRIL) 10 MG TABLET    Take 10 mg by mouth daily.   MULTIPLE VITAMIN (MULTIVITAMIN WITH MINERALS) TABS TABLET    Take 1 tablet by mouth daily.   NUTRITIONAL SUPPLEMENTS (TWOCAL HN) LIQD    Take 90 mLs by mouth 2 (two) times daily.   SENNOSIDES-DOCUSATE SODIUM (SENOKOT-S) 8.6-50 MG TABLET    Take 1 tablet by mouth daily.   SUCRALFATE (CARAFATE) 1 GM/10ML SUSPENSION    Take 1 g by mouth 4 (four) times daily -  before meals and at bedtime.   VITAMIN B-12 (CYANOCOBALAMIN) 1000 MCG TABLET    Take 1,000 mcg by mouth every other day.  Modified Medications   Modified Medication Previous Medication   METOPROLOL (LOPRESSOR) 25 MG TABLET metoprolol (LOPRESSOR) 50 MG tablet      Take 1 tablet (25 mg total) by mouth 2 (two) times daily. If pulse <50 or SBP <100 hold med and notify MD.    Take 50  mg by mouth 2 (two) times daily. If pulse <50 or SBP <100 hold med and notify MD.  Discontinued Medications   RISPERIDONE (RISPERDAL) 0.25 MG TABLET    Take 0.25 mg by mouth at bedtime.   SENNOSIDES-DOCUSATE SODIUM (SENOKOT-S) 8.6-50 MG TABLET    Take 1 tablet by mouth daily as needed for constipation.     Physical Exam: There were no vitals filed for this visit. Physical Exam  Constitutional: She appears well-developed and well-nourished. No distress.  Cardiovascular: Normal rate, regular rhythm, normal heart sounds and intact distal pulses.   Pulmonary/Chest: Effort normal and breath sounds normal. No respiratory distress.  Abdominal: Soft. Bowel sounds are normal. She exhibits no distension and no mass. There is no tenderness.    Musculoskeletal: Normal range of motion.  Rolls herself in her wheelchair, frequently takes her shoes off   Neurological: She is alert.  Oriented to person only  Skin: Skin is warm and dry. There is pallor.  Psychiatric: She has a normal mood and affect.     Labs reviewed: Basic Metabolic Panel:  Recent Labs  09/10/12 0215 09/17/12 1450 10/20/12 0335  NA 141 142 142  K 3.8 4.0 3.8  CL 105 108 104  CO2 25 27 29   GLUCOSE 78 85 79  BUN 25* 27* 21  CREATININE 0.75 0.81 0.65  CALCIUM 9.3 8.7 9.4    Liver Function Tests:  Recent Labs  09/17/12 1601  AST 27  ALT 13  ALKPHOS 56  BILITOT 0.4  PROT 6.5  ALBUMIN 3.0*    CBC:  Recent Labs  09/09/12 0954  09/10/12 0215 09/17/12 1450 10/20/12 0335  WBC 8.0  < > 7.5 5.7 8.6  NEUTROABS 4.8  --   --   --  4.7  HGB 11.3*  < > 10.8* 10.0* 11.2*  HCT 34.3*  < > 31.8* 30.5* 33.7*  MCV 89.1  < > 88.6 90.2 88.9  PLT 196  < > 197 164 189  < > = values in this interval not displayed. 01/08/13:  Flu a+b negative  01/02/13:  CXR new small right pleural effusion.  Mild bibasilar atelectasis improved on the right and worse on left, coexisting intersitial pneumonitis cannot be excluded at the left lung base (treated with levaquin and florastor for a week) 12/30/12:  Wbc 9, h/h 10.4/37.6, plts 198 10/31/12:  H/h 10.4/30.8, bun 23, cr 0.66, Na 136, K 3.7    Assessment/Plan 1. Dementia of the Alzheimer's type, with late onset, with delusions -she is stable in terms of this--still conversive and rolls around facility -continues on aricept, is not on namenda  2. Recurrent falls -due to her dementia primarily--no longer able to walk safely independently -Cont b12 due to this deficiency  3. Essential hypertension -bp at goal with amlodipine, lopressor, lisinopril  4. Anemia, iron deficiency -cont iron daily with breakfast  5. Senile osteoporosis -cont weekly fosamax, ca with D and additional vitamin D supplement  6. B12  deficiency -cont daily b12 oral repletion  Family/ staff Communication:  Discussed with nursing staff  Goals of care:  Long term care resident, DNR  Labs/tests ordered:  none

## 2013-03-20 ENCOUNTER — Encounter: Payer: Self-pay | Admitting: Internal Medicine

## 2013-03-30 ENCOUNTER — Encounter (HOSPITAL_COMMUNITY): Payer: Self-pay | Admitting: Emergency Medicine

## 2013-03-30 ENCOUNTER — Emergency Department (HOSPITAL_COMMUNITY)
Admission: EM | Admit: 2013-03-30 | Discharge: 2013-03-30 | Disposition: A | Discharge status: Home / Self Care | Payer: Medicare Other | Attending: Emergency Medicine | Admitting: Emergency Medicine

## 2013-03-30 DIAGNOSIS — Z85038 Personal history of other malignant neoplasm of large intestine: Secondary | ICD-10-CM | POA: Diagnosis not present

## 2013-03-30 DIAGNOSIS — Z7982 Long term (current) use of aspirin: Secondary | ICD-10-CM | POA: Insufficient documentation

## 2013-03-30 DIAGNOSIS — Z79899 Other long term (current) drug therapy: Secondary | ICD-10-CM | POA: Insufficient documentation

## 2013-03-30 DIAGNOSIS — S1093XA Contusion of unspecified part of neck, initial encounter: Principal | ICD-10-CM

## 2013-03-30 DIAGNOSIS — D509 Iron deficiency anemia, unspecified: Secondary | ICD-10-CM | POA: Diagnosis not present

## 2013-03-30 DIAGNOSIS — F0280 Dementia in other diseases classified elsewhere without behavioral disturbance: Secondary | ICD-10-CM | POA: Diagnosis not present

## 2013-03-30 DIAGNOSIS — Z8709 Personal history of other diseases of the respiratory system: Secondary | ICD-10-CM | POA: Insufficient documentation

## 2013-03-30 DIAGNOSIS — I2510 Atherosclerotic heart disease of native coronary artery without angina pectoris: Secondary | ICD-10-CM | POA: Insufficient documentation

## 2013-03-30 DIAGNOSIS — G309 Alzheimer's disease, unspecified: Secondary | ICD-10-CM | POA: Insufficient documentation

## 2013-03-30 DIAGNOSIS — Z9181 History of falling: Secondary | ICD-10-CM | POA: Diagnosis not present

## 2013-03-30 DIAGNOSIS — I10 Essential (primary) hypertension: Secondary | ICD-10-CM | POA: Insufficient documentation

## 2013-03-30 DIAGNOSIS — Z87891 Personal history of nicotine dependence: Secondary | ICD-10-CM | POA: Insufficient documentation

## 2013-03-30 DIAGNOSIS — Z8619 Personal history of other infectious and parasitic diseases: Secondary | ICD-10-CM | POA: Insufficient documentation

## 2013-03-30 DIAGNOSIS — Y9210 Unspecified residential institution as the place of occurrence of the external cause: Secondary | ICD-10-CM | POA: Diagnosis not present

## 2013-03-30 DIAGNOSIS — Y939 Activity, unspecified: Secondary | ICD-10-CM | POA: Insufficient documentation

## 2013-03-30 DIAGNOSIS — K5900 Constipation, unspecified: Secondary | ICD-10-CM | POA: Diagnosis not present

## 2013-03-30 DIAGNOSIS — W19XXXA Unspecified fall, initial encounter: Secondary | ICD-10-CM | POA: Diagnosis not present

## 2013-03-30 DIAGNOSIS — E538 Deficiency of other specified B group vitamins: Secondary | ICD-10-CM | POA: Insufficient documentation

## 2013-03-30 DIAGNOSIS — S0003XA Contusion of scalp, initial encounter: Secondary | ICD-10-CM | POA: Insufficient documentation

## 2013-03-30 DIAGNOSIS — T148XXA Other injury of unspecified body region, initial encounter: Secondary | ICD-10-CM

## 2013-03-30 DIAGNOSIS — M810 Age-related osteoporosis without current pathological fracture: Secondary | ICD-10-CM | POA: Insufficient documentation

## 2013-03-30 DIAGNOSIS — E785 Hyperlipidemia, unspecified: Secondary | ICD-10-CM | POA: Insufficient documentation

## 2013-03-30 DIAGNOSIS — S0990XA Unspecified injury of head, initial encounter: Secondary | ICD-10-CM | POA: Diagnosis present

## 2013-03-30 DIAGNOSIS — I739 Peripheral vascular disease, unspecified: Secondary | ICD-10-CM | POA: Insufficient documentation

## 2013-03-30 DIAGNOSIS — S0083XA Contusion of other part of head, initial encounter: Secondary | ICD-10-CM

## 2013-03-30 NOTE — ED Notes (Signed)
Per EMS, pt coming from [facility] home with an unwitnessed fall. Pt has visible hematoma to right forehead. Pt has h/o falls and dementia. Unknown LOC. Pt denies any pain at this time.

## 2013-03-30 NOTE — ED Notes (Signed)
PA at bedside.

## 2013-03-30 NOTE — ED Provider Notes (Signed)
CSN: [id number]     Arrival date & time 03/30/13  1906 History   First MD Initiated Contact with Patient 03/30/13 1907     Chief Complaint  Patient presents with  . Fall     (Consider location/radiation/quality/duration/timing/severity/associated sxs/prior Treatment) HPI Comments: Spoke to staff at pt's nursing facility who was not caring for patient at the reported time of injury, but pt must have had an unwitnessed fall around 3 PM and staff noticed a hematoma developing around 4 PM and has been progressing.  Person on phone unable to describe if pt is much changed from baseline, but pt has known h/o severe dementia.  Pt can tell me she "hurts all over" but doesn't specify a HA or neck pain, upper extremity pain, hip pain.  Pt was been doing her normal activities.  Because hematoma was getting bigger, pt was sent to the ED.    Patient is a 80 y.o. female presenting with fall. The history is provided by the patient and a caregiver.  Fall    Past Medical History  Diagnosis Date  . Diverticulosis of colon (without mention of hemorrhage) 06/24/2006  . Carcinoma of cecum 1992    History of  . Vitamin B12 deficiency   . Alzheimer's disease   . HTN (hypertension)   . Depression   . PVD (peripheral vascular disease)   . CAD (coronary artery disease)   . GERD (gastroesophageal reflux disease)   . H. pylori infection 2004  . Presbyesophagus   . Iron deficiency anemia, unspecified 01/02/2012  . Personal history of fall 01/04/2009  . Unspecified constipation   . Hemorrhage of rectum and anus   . Chest pain, unspecified   . Acute pharyngitis   . Malignant neoplasm of colon, unspecified site   . Edema   . Cataract   . HLD (hyperlipidemia)   . Osteoporosis, unspecified    Past Surgical History  Procedure Laterality Date  . Gallbladder surgery    . Abdominal hysterectomy  1995    with bso  . Right colectomy    . Cataract extraction  2001  . Cholecystectomy Right    Family History   Problem Relation Age of Onset  . Heart disease Mother   . Colon cancer Neg Hx    History  Substance Use Topics  . Smoking status: Former Smoker    Types: Cigarettes  . Smokeless tobacco: Never Used  . Alcohol Use: No   OB History   Grav Para Term Preterm Abortions TAB SAB Ect Mult Living                 Review of Systems  Unable to perform ROS: Dementia      Allergies  Captopril-hydrochlorothiazide; Niacin and related; Urispas; and Verapamil  Home Medications   Current Outpatient Rx  Name  Route  Sig  Dispense  Refill  . acetaminophen (TYLENOL) 650 MG CR tablet   Oral   Take 650 mg by mouth 3 (three) times daily.         Marland Kitchen alendronate (FOSAMAX) 70 MG tablet   Oral   Take 70 mg by mouth every 7 (seven) days. Takes on Mondays. Take with a full glass of water on an empty stomach.         Marland Kitchen amLODipine (NORVASC) 10 MG tablet   Oral   Take 10 mg by mouth daily.         Marland Kitchen aspirin EC 81 MG tablet   Oral  Take 81 mg by mouth daily.         Marland Kitchen atorvastatin (LIPITOR) 10 MG tablet   Oral   Take 5 mg by mouth at bedtime. Take half a tab to equal 5mg          . Calcium Carbonate-Vitamin D (CALCIUM-VITAMIN D) 500-200 MG-UNIT per tablet   Oral   Take 1 tablet by mouth daily.         . cholecalciferol (VITAMIN D) 400 UNITS TABS tablet   Oral   Take 400 Units by mouth daily.         . divalproex (DEPAKOTE SPRINKLE) 125 MG capsule   Oral   Take 250 mg by mouth 2 (two) times daily.         Marland Kitchen donepezil (ARICEPT) 10 MG tablet   Oral   Take 10 mg by mouth at bedtime.          . DULoxetine (CYMBALTA) 60 MG capsule   Oral   Take 60 mg by mouth daily.         . ferrous sulfate 325 (65 FE) MG tablet   Oral   Take 325 mg by mouth daily with breakfast.         . lisinopril (PRINIVIL,ZESTRIL) 10 MG tablet   Oral   Take 10 mg by mouth daily.         . metoprolol (LOPRESSOR) 50 MG tablet   Oral   Take 50 mg by mouth 2 (two) times daily. If pulse  <50 or SBP <100 hold med and notify MD.         . Multiple Vitamin (MULTIVITAMIN WITH MINERALS) TABS tablet   Oral   Take 1 tablet by mouth daily.         . Nutritional Supplements (TWOCAL HN) LIQD   Oral   Take 90 mLs by mouth 2 (two) times daily.         . sucralfate (CARAFATE) 1 GM/10ML suspension   Oral   Take 1 g by mouth 4 (four) times daily -  before meals and at bedtime.         . vitamin B-12 (CYANOCOBALAMIN) 1000 MCG tablet   Oral   Take 1,000 mcg by mouth every other day.          BP 110/92  Pulse 72  Temp(Src) 97.8 F (36.6 C) (Oral)  Resp 16  SpO2 97% Physical Exam  Nursing note and vitals reviewed. Constitutional: She appears well-developed and well-nourished. No distress.  HENT:  Head: Normocephalic.  Right Ear: External ear and ear canal normal.  Left Ear: External ear and ear canal normal.  Eyes: Conjunctivae and EOM are normal. No scleral icterus.  Neck: Normal range of motion and full passive range of motion without pain. No spinous process tenderness and no muscular tenderness present.  Cardiovascular: Normal rate and regular rhythm.   Pulmonary/Chest: Effort normal. No respiratory distress. She has no wheezes.  Abdominal: Soft. She exhibits no distension. There is no tenderness.  Musculoskeletal:       Right shoulder: She exhibits no tenderness.       Left shoulder: She exhibits no tenderness.       Right hip: She exhibits no tenderness.       Left hip: She exhibits no tenderness.       Cervical back: She exhibits no bony tenderness.       Thoracic back: She exhibits no bony tenderness.       Lumbar  back: She exhibits no bony tenderness.  Neurological: She is alert.  Skin: Skin is warm. She is not diaphoretic.    ED Course  Procedures (including critical care time) Labs Review Labs Reviewed - No data to display Imaging Review No results found.   EKG Interpretation   Date/Time:  Monday March 30 2013 19:18:29 EDT Ventricular  Rate:  63 PR Interval:  187 QRS Duration: 76 QT Interval:  451 QTC Calculation: 462 R Axis:   -18 Text Interpretation:  Sinus rhythm Left ventricular hypertrophy Inferior  infarct, old Baseline wander in lead(s) V1 Poor R wave progression Poor  data quality Confirmed by [facility]  MD, Hoover Browns ([id number]) on 03/30/2013 10:01:33  PM      RA sat is 99% and I interpret to be normal  8:44 PM Left a message with MR. Jeneen Rinks, son in MD to call me back.  Pt remains pleasant with no clinical changes at this time.    10:02 PM Spoke to Dr. Nyoka Cowden covering for Dr. Mariea Clonts who agrees on sending pt back to facility withotu CT scan since it has now been over 6 hours since head injury and no clinical changes at this time.  Pt observed in the ED for > 3 hours without any changes.  MDM   Final diagnoses:  Forehead contusion  Hematoma     Pt with unwitnessed fall.  Hemoatoma.  Injury was 6 hours ago.  Not obtunded, no focal deficits.  Likely just external head wound.  Not on couamdin.  Will discuss with family.  Pt is DNR.    Saddie Benders. Dorna Mai, MD 03/30/13 2202

## 2013-04-08 ENCOUNTER — Encounter: Payer: Self-pay | Admitting: Internal Medicine

## 2013-04-08 ENCOUNTER — Non-Acute Institutional Stay (SKILLED_NURSING_FACILITY): Payer: Medicare Other | Admitting: Internal Medicine

## 2013-04-08 DIAGNOSIS — D509 Iron deficiency anemia, unspecified: Secondary | ICD-10-CM

## 2013-04-08 DIAGNOSIS — I10 Essential (primary) hypertension: Secondary | ICD-10-CM

## 2013-04-08 DIAGNOSIS — R296 Repeated falls: Secondary | ICD-10-CM

## 2013-04-08 DIAGNOSIS — F22 Delusional disorders: Secondary | ICD-10-CM

## 2013-04-08 DIAGNOSIS — F329 Major depressive disorder, single episode, unspecified: Secondary | ICD-10-CM

## 2013-04-08 DIAGNOSIS — F32A Depression, unspecified: Secondary | ICD-10-CM

## 2013-04-08 DIAGNOSIS — Z9181 History of falling: Secondary | ICD-10-CM

## 2013-04-08 DIAGNOSIS — G309 Alzheimer's disease, unspecified: Secondary | ICD-10-CM

## 2013-04-08 DIAGNOSIS — F0280 Dementia in other diseases classified elsewhere without behavioral disturbance: Secondary | ICD-10-CM

## 2013-04-08 DIAGNOSIS — F02818 Dementia in other diseases classified elsewhere, unspecified severity, with other behavioral disturbance: Secondary | ICD-10-CM

## 2013-04-08 DIAGNOSIS — F3289 Other specified depressive episodes: Secondary | ICD-10-CM

## 2013-04-08 DIAGNOSIS — G301 Alzheimer's disease with late onset: Secondary | ICD-10-CM

## 2013-04-08 DIAGNOSIS — F0392 Unspecified dementia, unspecified severity, with psychotic disturbance: Secondary | ICD-10-CM

## 2013-04-08 DIAGNOSIS — M810 Age-related osteoporosis without current pathological fracture: Secondary | ICD-10-CM

## 2013-04-08 DIAGNOSIS — F0390 Unspecified dementia without behavioral disturbance: Secondary | ICD-10-CM

## 2013-04-08 DIAGNOSIS — F0281 Dementia in other diseases classified elsewhere with behavioral disturbance: Secondary | ICD-10-CM

## 2013-04-08 MED ORDER — METOPROLOL TARTRATE 25 MG PO TABS: 25.0000 mg | ORAL_TABLET | Freq: Two times a day (BID) | ORAL | Status: AC

## 2013-04-08 NOTE — Progress Notes (Signed)
Patient ID: [name], female   DOB: 19-Jun-1922, 80 y.o.   MRN: [id number]  Location:  [facility] Provider:  Rexene Edison. Mariea Clonts, D.O., C.M.D.  Code Status:  Full code  Chief Complaint  Patient presents with  . Medical Managment of Chronic Issues    recent fall 03/30/13 with head contusion right temple, bradycardia and relative hypotension on pharmacy review    HPI:  80 yo white female with h/o AD, senile osteoporosis, multiple falls seen due to bradycardia noted on pharmacy review.  She recently fell and struck her head above the right temple and has a contusion.  Her HR has been 50-60 consistently.  Pt has no complaints.  Review of Systems:  Review of Systems  Constitutional: Negative for fever.  HENT: Negative for congestion.   Eyes: Negative for blurred vision.  Respiratory: Negative for shortness of breath.   Cardiovascular: Negative for chest pain.  Gastrointestinal: Negative for abdominal pain.  Genitourinary: Negative for dysuria.  Musculoskeletal: Positive for falls.  Skin: Negative for rash.  Neurological: Negative for dizziness.  Psychiatric/Behavioral: Positive for memory loss. Negative for depression.    Medications: Patient's Medications  New Prescriptions   No medications on file  Previous Medications   ACETAMINOPHEN (TYLENOL) 650 MG CR TABLET    Take 650 mg by mouth 3 (three) times daily.   ALENDRONATE (FOSAMAX) 70 MG TABLET    Take 70 mg by mouth every 7 (seven) days. Takes on Mondays. Take with a full glass of water on an empty stomach.   AMLODIPINE (NORVASC) 10 MG TABLET    Take 10 mg by mouth daily.   ASPIRIN EC 81 MG TABLET    Take 81 mg by mouth daily.   ATORVASTATIN (LIPITOR) 10 MG TABLET    Take 5 mg by mouth at bedtime. Take half a tab to equal 5mg    CALCIUM CARBONATE-VITAMIN D (CALCIUM-VITAMIN D) 500-200 MG-UNIT PER TABLET    Take 1 tablet by mouth daily.   CHOLECALCIFEROL (VITAMIN D) 400 UNITS TABS TABLET    Take 400 Units by mouth  daily.   DIVALPROEX (DEPAKOTE SPRINKLE) 125 MG CAPSULE    Take 250 mg by mouth 2 (two) times daily.   DONEPEZIL (ARICEPT) 10 MG TABLET    Take 10 mg by mouth at bedtime.    DULOXETINE (CYMBALTA) 60 MG CAPSULE    Take 60 mg by mouth daily.   FERROUS SULFATE 325 (65 FE) MG TABLET    Take 325 mg by mouth daily with breakfast.   LISINOPRIL (PRINIVIL,ZESTRIL) 10 MG TABLET    Take 10 mg by mouth daily.   METOPROLOL (LOPRESSOR) 50 MG TABLET    Take 50 mg by mouth 2 (two) times daily. If pulse <50 or SBP <100 hold med and notify MD.   MULTIPLE VITAMIN (MULTIVITAMIN WITH MINERALS) TABS TABLET    Take 1 tablet by mouth daily.   NUTRITIONAL SUPPLEMENTS (TWOCAL HN) LIQD    Take 90 mLs by mouth 2 (two) times daily.   SUCRALFATE (CARAFATE) 1 GM/10ML SUSPENSION    Take 1 g by mouth 4 (four) times daily -  before meals and at bedtime.   VITAMIN B-12 (CYANOCOBALAMIN) 1000 MCG TABLET    Take 1,000 mcg by mouth every other day.  Modified Medications   No medications on file  Discontinued Medications   No medications on file    Physical Exam: Filed Vitals:   04/08/13 1241  BP: 145/55  Pulse: 57  Temp: 96.8  F (36 C)  Resp: 20  Weight: 121 lb (54.885 kg)  Physical Exam  Constitutional:  Frail petite white female seated in her wheelchair which is low to ground  Cardiovascular: Normal rate, regular rhythm, normal heart sounds and intact distal pulses.   Pulmonary/Chest: Effort normal and breath sounds normal. She has no rales.  Abdominal: Soft. Bowel sounds are normal. She exhibits no distension and no mass. There is no tenderness.  Musculoskeletal: Normal range of motion.  Neurological: She is alert.  Skin: Skin is warm and dry. There is pallor.  Ecchymoses over right temple  Psychiatric: She has a normal mood and affect.     Labs reviewed: Basic Metabolic Panel:  Recent Labs  09/10/12 0215 09/17/12 1450 10/20/12 0335  NA 141 142 142  K 3.8 4.0 3.8  CL 105 108 104  CO2 25 27 29   GLUCOSE  78 85 79  BUN 25* 27* 21  CREATININE 0.75 0.81 0.65  CALCIUM 9.3 8.7 9.4    Liver Function Tests:  Recent Labs  09/17/12 1601  AST 27  ALT 13  ALKPHOS 56  BILITOT 0.4  PROT 6.5  ALBUMIN 3.0*    CBC:  Recent Labs  09/09/12 0954  09/10/12 0215 09/17/12 1450 10/20/12 0335  WBC 8.0  < > 7.5 5.7 8.6  NEUTROABS 4.8  --   --   --  4.7  HGB 11.3*  < > 10.8* 10.0* 11.2*  HCT 34.3*  < > 31.8* 30.5* 33.7*  MCV 89.1  < > 88.6 90.2 88.9  PLT 196  < > 197 164 189  < > = values in this interval not displayed.  Significant Diagnostic Results:  Last brain imaging was 02/12/13 with previous fall where she hit her head  Assessment/Plan 1. Dementia of the Alzheimer's type, with late onset, with delusions -has been stable, continues to try to get up and walk and falls, many precautions have been taken including the mat on the floor in her room , low wheelchair, reduction of some meds  2. Recurrent falls -as above, this time, we will reduce her metoprolol to see if that helps (cut back to 25mg  po bid) -also increase vitamin D to 2000 units daily  -check cbc, bmp, ferritin next draw  3. Anemia, iron deficiency -cont iron, check cbc and ferritin next time;  Could certainly contribute to falls  4. HTN (hypertension) -can tolerate a reduction of medication to increase pulse some  5. Senile osteoporosis -increase vitamin D dose, cont fosamax  6. Depression -stable  Regular diet, mechanical soft with thin liquids, no salt packets  Family/ staff Communication: discussed with nursing staff  Goals of care: long term care resident, qol, DNR  Labs/tests ordered:  Cbc, bmp, ferritin

## 2013-05-12 ENCOUNTER — Non-Acute Institutional Stay (SKILLED_NURSING_FACILITY): Payer: Medicare Other | Admitting: Internal Medicine

## 2013-05-12 DIAGNOSIS — J189 Pneumonia, unspecified organism: Secondary | ICD-10-CM

## 2013-05-12 DIAGNOSIS — F0280 Dementia in other diseases classified elsewhere without behavioral disturbance: Secondary | ICD-10-CM

## 2013-05-12 DIAGNOSIS — F22 Delusional disorders: Secondary | ICD-10-CM

## 2013-05-12 DIAGNOSIS — G309 Alzheimer's disease, unspecified: Secondary | ICD-10-CM

## 2013-05-12 DIAGNOSIS — F0281 Dementia in other diseases classified elsewhere with behavioral disturbance: Secondary | ICD-10-CM

## 2013-05-12 DIAGNOSIS — F0392 Unspecified dementia, unspecified severity, with psychotic disturbance: Secondary | ICD-10-CM

## 2013-05-12 DIAGNOSIS — G301 Alzheimer's disease with late onset: Secondary | ICD-10-CM

## 2013-05-12 DIAGNOSIS — F0390 Unspecified dementia without behavioral disturbance: Secondary | ICD-10-CM

## 2013-05-23 ENCOUNTER — Encounter: Payer: Self-pay | Admitting: Internal Medicine

## 2013-05-23 NOTE — Progress Notes (Signed)
MRN: [id number] Name: [name]  Sex: female Age: 80 y.o. DOB: Mar 01, 1922  [facility] #: [id number] Facility/Room: [facility] Level Of Care: SNF Provider: Hennie Duos Emergency Contacts: Extended Emergency Contact Information Primary Emergency Contact: [name] [place] of [place] Mobile Phone: [phone] Relation: Son Secondary Emergency Contact: [name],[name] Address: [address]          [place], [place] [address] [name] of [place] Phone: [phone] Relation: Other  Code Status: DNR  Allergies: Captopril-hydrochlorothiazide; Niacin and related; Urispas; and Verapamil  Chief Complaint  Patient presents with  . Acute Visit    HPI: Patient is 80 y.o. female who is being seen for PNA.  Past Medical History  Diagnosis Date  . Diverticulosis of colon (without mention of hemorrhage) 06/24/2006  . Carcinoma of cecum 1992    History of  . Vitamin B12 deficiency   . Alzheimer's disease   . HTN (hypertension)   . Depression   . PVD (peripheral vascular disease)   . CAD (coronary artery disease)   . GERD (gastroesophageal reflux disease)   . H. pylori infection 2004  . Presbyesophagus   . Iron deficiency anemia, unspecified 01/02/2012  . Personal history of fall 01/04/2009  . Unspecified constipation   . Hemorrhage of rectum and anus   . Chest pain, unspecified   . Acute pharyngitis   . Malignant neoplasm of colon, unspecified site   . Edema   . Cataract   . HLD (hyperlipidemia)   . Osteoporosis, unspecified     Past Surgical History  Procedure Laterality Date  . Gallbladder surgery    . Abdominal hysterectomy  1995    with bso  . Right colectomy    . Cataract extraction  2001  . Cholecystectomy Right       Medication List       This list is accurate as of: 05/12/13 11:59 PM.  Always use your most recent med list.               acetaminophen 650 MG CR tablet  Commonly known as:  TYLENOL  Take 650 mg by mouth 3 (three) times daily.      alendronate 70 MG tablet  Commonly known as:  FOSAMAX  Take 70 mg by mouth every 7 (seven) days. Takes on Mondays. Take with a full glass of water on an empty stomach.     amLODipine 10 MG tablet  Commonly known as:  NORVASC  Take 10 mg by mouth daily.     aspirin EC 81 MG tablet  Take 81 mg by mouth daily.     atorvastatin 10 MG tablet  Commonly known as:  LIPITOR  Take 5 mg by mouth at bedtime. Take half a tab to equal 5mg      calcium-vitamin D 500-200 MG-UNIT per tablet  Take 1 tablet by mouth daily.     cholecalciferol 400 UNITS Tabs tablet  Commonly known as:  VITAMIN D  Take 400 Units by mouth daily.     divalproex 125 MG capsule  Commonly known as:  DEPAKOTE SPRINKLE  Take 250 mg by mouth 2 (two) times daily.     donepezil 10 MG tablet  Commonly known as:  ARICEPT  Take 10 mg by mouth at bedtime.     DULoxetine 60 MG capsule  Commonly known as:  CYMBALTA  Take 60 mg by mouth daily.     ferrous sulfate 325 (65 FE) MG tablet  Take 325 mg by mouth daily  with breakfast.     lisinopril 10 MG tablet  Commonly known as:  PRINIVIL,ZESTRIL  Take 10 mg by mouth daily.     metoprolol tartrate 25 MG tablet  Commonly known as:  LOPRESSOR  Take 1 tablet (25 mg total) by mouth 2 (two) times daily. If pulse <50 or SBP <100 hold med and notify MD.     multivitamin with minerals Tabs tablet  Take 1 tablet by mouth daily.     sennosides-docusate sodium 8.6-50 MG tablet  Commonly known as:  SENOKOT-S  Take 1 tablet by mouth daily.     sucralfate 1 GM/10ML suspension  Commonly known as:  CARAFATE  Take 1 g by mouth 4 (four) times daily -  before meals and at bedtime.     TWOCAL HN Liqd  Take 90 mLs by mouth 2 (two) times daily.     vitamin B-12 1000 MCG tablet  Commonly known as:  CYANOCOBALAMIN  Take 1,000 mcg by mouth every other day.        No orders of the defined types were placed in this encounter.    Immunization History  Administered Date(s)  Administered  . Influenza Whole 10/22/2004, 10/30/2012  . PPD Test 08/03/2009  . Pneumococcal Polysaccharide-23 01/23/1999  . Pneumococcal-Unspecified 04/07/2012    History  Substance Use Topics  . Smoking status: Former Smoker    Types: Cigarettes  . Smokeless tobacco: Never Used  . Alcohol Use: No    Review of Systems  DATA OBTAINED: from patient, nurse GENERAL: Feels well no fevers, fatigue, appetite changes SKIN: No itching, rash HEENT: No complaint RESPIRATORY: No cough, wheezing, SOB CARDIAC: No chest pain, palpitations, lower extremity edema  GI: No abdominal pain, No N/V/D or constipation, No heartburn or reflux  GU: No dysuria, frequency or urgency, or incontinence  MUSCULOSKELETAL: No unrelieved bone/joint pain NEUROLOGIC: No headache, dizziness or focal weakness PSYCHIATRIC: No overt anxiety or sadness. Sleeps well.   Filed Vitals:   05/23/13 1800  BP: 143/84  Pulse: 70  Temp: 97.3 F (36.3 C)  Resp: 16    Physical Exam  GENERAL APPEARANCE: Alert, mod conversant. Appropriately groomed. No acute distress; pt is unaware that she is sick SKIN: No diaphoresis rash HEENT: Unremarkable RESPIRATORY: Breathing is even, unlabored. Lung sounds are diffusely decreased, no rales appreciated  CARDIOVASCULAR: Heart RRR no murmurs, rubs or gallops. No peripheral edema  GASTROINTESTINAL: Abdomen is soft, non-tender, not distended w/ normal bowel sounds.  GENITOURINARY: Bladder non tender, not distended  MUSCULOSKELETAL: No abnormal joints or musculature NEUROLOGIC: Cranial nerves 2-12 grossly intact. Moves all extremities no tremor. PSYCHIATRIC: dementia no behavioral issues  Patient Active Problem List   Diagnosis Date Noted  . Senile osteoporosis 02/06/2013  . Osteoporosis, unspecified   . Bradycardia 09/18/2012  . Altered mental status 09/17/2012  . Hypotension 09/17/2012  . HLD (hyperlipidemia)   . Generalized weakness 09/09/2012  . Recurrent falls  09/09/2012  . Anemia 09/09/2012  . Anemia, iron deficiency 04/16/2012  . Sepsis 04/06/2012  . Acute respiratory distress 04/06/2012  . Fever 04/06/2012  . Dehydration 04/06/2012  . Sinus tachycardia 04/06/2012  . Depression 04/06/2012  . CAD (coronary artery disease) 04/06/2012  . HCAP (healthcare-associated pneumonia) 04/06/2012  . Encephalopathy acute 12/28/2011  . Dementia of the Alzheimer's type, with late onset, with delusions 12/28/2011  . Dizziness 12/24/2011  . HTN (hypertension) 12/24/2011  . Dysphagia 08/07/2011  . GERD (gastroesophageal reflux disease) 08/07/2011    CBC    Component Value Date/Time  WBC 8.6 10/20/2012 0335   RBC 3.79* 10/20/2012 0335   HGB 11.2* 10/20/2012 0335   HCT 33.7* 10/20/2012 0335   PLT 189 10/20/2012 0335   MCV 88.9 10/20/2012 0335   LYMPHSABS 3.0 10/20/2012 0335   MONOABS 0.7 10/20/2012 0335   EOSABS 0.2 10/20/2012 0335   BASOSABS 0.0 10/20/2012 0335    CMP     Component Value Date/Time   NA 142 10/20/2012 0335   K 3.8 10/20/2012 0335   CL 104 10/20/2012 0335   CO2 29 10/20/2012 0335   GLUCOSE 79 10/20/2012 0335   BUN 21 10/20/2012 0335   CREATININE 0.65 10/20/2012 0335   CALCIUM 9.4 10/20/2012 0335   PROT 6.5 09/17/2012 1601   ALBUMIN 3.0* 09/17/2012 1601   AST 27 09/17/2012 1601   ALT 13 09/17/2012 1601   ALKPHOS 56 09/17/2012 1601   BILITOT 0.4 09/17/2012 1601   GFRNONAA 77* 10/20/2012 0335   GFRAA 89* 10/20/2012 0335    Assessment and Plan  HCAP (healthcare-associated pneumonia) CXR shows infiltrate; will treat with levaquin for 7 days    Hennie Duos, MD

## 2013-05-23 NOTE — Assessment & Plan Note (Addendum)
CXR shows infiltrate; probably aspiration; will treat with levaquin for 7 days

## 2013-08-02 ENCOUNTER — Encounter: Payer: Self-pay | Admitting: Internal Medicine

## 2013-08-07 ENCOUNTER — Emergency Department (HOSPITAL_COMMUNITY)
Admission: EM | Admit: 2013-08-07 | Discharge: 2013-08-07 | Disposition: A | Discharge status: Home / Self Care | Payer: Medicare Other | Attending: Emergency Medicine | Admitting: Emergency Medicine

## 2013-08-07 ENCOUNTER — Encounter (HOSPITAL_COMMUNITY): Payer: Self-pay | Admitting: Emergency Medicine

## 2013-08-07 ENCOUNTER — Emergency Department (HOSPITAL_COMMUNITY): Payer: Medicare Other

## 2013-08-07 DIAGNOSIS — S0003XA Contusion of scalp, initial encounter: Secondary | ICD-10-CM | POA: Insufficient documentation

## 2013-08-07 DIAGNOSIS — S1093XA Contusion of unspecified part of neck, initial encounter: Principal | ICD-10-CM

## 2013-08-07 DIAGNOSIS — W050XXA Fall from non-moving wheelchair, initial encounter: Secondary | ICD-10-CM | POA: Insufficient documentation

## 2013-08-07 DIAGNOSIS — Y9210 Unspecified residential institution as the place of occurrence of the external cause: Secondary | ICD-10-CM | POA: Insufficient documentation

## 2013-08-07 DIAGNOSIS — Z862 Personal history of diseases of the blood and blood-forming organs and certain disorders involving the immune mechanism: Secondary | ICD-10-CM | POA: Insufficient documentation

## 2013-08-07 DIAGNOSIS — Z7982 Long term (current) use of aspirin: Secondary | ICD-10-CM | POA: Insufficient documentation

## 2013-08-07 DIAGNOSIS — S0093XA Contusion of unspecified part of head, initial encounter: Secondary | ICD-10-CM

## 2013-08-07 DIAGNOSIS — N390 Urinary tract infection, site not specified: Secondary | ICD-10-CM | POA: Insufficient documentation

## 2013-08-07 DIAGNOSIS — Z79899 Other long term (current) drug therapy: Secondary | ICD-10-CM | POA: Insufficient documentation

## 2013-08-07 DIAGNOSIS — Z87891 Personal history of nicotine dependence: Secondary | ICD-10-CM | POA: Insufficient documentation

## 2013-08-07 DIAGNOSIS — Z860100 Personal history of colon polyps, unspecified: Secondary | ICD-10-CM | POA: Insufficient documentation

## 2013-08-07 DIAGNOSIS — Z85038 Personal history of other malignant neoplasm of large intestine: Secondary | ICD-10-CM | POA: Insufficient documentation

## 2013-08-07 DIAGNOSIS — I2510 Atherosclerotic heart disease of native coronary artery without angina pectoris: Secondary | ICD-10-CM | POA: Insufficient documentation

## 2013-08-07 DIAGNOSIS — Z8639 Personal history of other endocrine, nutritional and metabolic disease: Secondary | ICD-10-CM | POA: Insufficient documentation

## 2013-08-07 DIAGNOSIS — W19XXXA Unspecified fall, initial encounter: Secondary | ICD-10-CM

## 2013-08-07 DIAGNOSIS — Z8719 Personal history of other diseases of the digestive system: Secondary | ICD-10-CM | POA: Insufficient documentation

## 2013-08-07 DIAGNOSIS — Z8619 Personal history of other infectious and parasitic diseases: Secondary | ICD-10-CM | POA: Insufficient documentation

## 2013-08-07 DIAGNOSIS — I10 Essential (primary) hypertension: Secondary | ICD-10-CM | POA: Insufficient documentation

## 2013-08-07 DIAGNOSIS — Z8669 Personal history of other diseases of the nervous system and sense organs: Secondary | ICD-10-CM | POA: Insufficient documentation

## 2013-08-07 DIAGNOSIS — S0083XA Contusion of other part of head, initial encounter: Principal | ICD-10-CM | POA: Insufficient documentation

## 2013-08-07 DIAGNOSIS — Y9389 Activity, other specified: Secondary | ICD-10-CM | POA: Insufficient documentation

## 2013-08-07 DIAGNOSIS — E870 Hyperosmolality and hypernatremia: Secondary | ICD-10-CM

## 2013-08-07 DIAGNOSIS — F0280 Dementia in other diseases classified elsewhere without behavioral disturbance: Secondary | ICD-10-CM | POA: Insufficient documentation

## 2013-08-07 DIAGNOSIS — G309 Alzheimer's disease, unspecified: Secondary | ICD-10-CM | POA: Insufficient documentation

## 2013-08-07 LAB — BASIC METABOLIC PANEL
ANION GAP: 11 (ref 5–15)
BUN: 17 mg/dL (ref 6–23)
CALCIUM: 9.4 mg/dL (ref 8.4–10.5)
CHLORIDE: 109 meq/L (ref 96–112)
CO2: 29 mEq/L (ref 19–32)
CREATININE: 0.66 mg/dL (ref 0.50–1.10)
GFR calc Af Amer: 89 mL/min — ABNORMAL LOW (ref >90)
GFR calc non Af Amer: 77 mL/min — ABNORMAL LOW (ref >90)
GLUCOSE: 84 mg/dL (ref 70–99)
Potassium: 3.9 mEq/L (ref 3.7–5.3)
Sodium: 149 mEq/L — ABNORMAL HIGH (ref 137–147)

## 2013-08-07 LAB — CBC WITH DIFFERENTIAL/PLATELET
Basophils Absolute: 0 10*3/uL (ref 0.0–0.1)
Basophils Relative: 0 % (ref 0–1)
EOS PCT: 2 % (ref 0–5)
Eosinophils Absolute: 0.2 10*3/uL (ref 0.0–0.7)
HCT: 35.7 % — ABNORMAL LOW (ref 36.0–46.0)
HEMOGLOBIN: 11.2 g/dL — AB (ref 12.0–15.0)
LYMPHS ABS: 1.3 10*3/uL (ref 0.7–4.0)
LYMPHS PCT: 21 % (ref 12–46)
MCH: 28.2 pg (ref 26.0–34.0)
MCHC: 31.4 g/dL (ref 30.0–36.0)
MCV: 89.9 fL (ref 78.0–100.0)
MONO ABS: 0.5 10*3/uL (ref 0.1–1.0)
Monocytes Relative: 8 % (ref 3–12)
NEUTROS ABS: 4.4 10*3/uL (ref 1.7–7.7)
Neutrophils Relative %: 69 % (ref 43–77)
Platelets: 195 10*3/uL (ref 150–400)
RBC: 3.97 MIL/uL (ref 3.87–5.11)
RDW: 13.4 % (ref 11.5–15.5)
WBC: 6.4 10*3/uL (ref 4.0–10.5)

## 2013-08-07 LAB — URINALYSIS, ROUTINE W REFLEX MICROSCOPIC
BILIRUBIN URINE: NEGATIVE
GLUCOSE, UA: NEGATIVE mg/dL
Hgb urine dipstick: NEGATIVE
KETONES UR: NEGATIVE mg/dL
Nitrite: NEGATIVE
Protein, ur: NEGATIVE mg/dL
Specific Gravity, Urine: 1.013 (ref 1.005–1.030)
Urobilinogen, UA: 0.2 mg/dL (ref 0.0–1.0)
pH: 7.5 (ref 5.0–8.0)

## 2013-08-07 LAB — URINE MICROSCOPIC-ADD ON

## 2013-08-07 MED ORDER — DEXTROSE 5 % IV SOLN
1.0000 g | Freq: Once | INTRAVENOUS | Status: AC
  Administered 2013-08-07: 1 g via INTRAVENOUS
  Filled 2013-08-07: qty 10

## 2013-08-07 MED ORDER — SODIUM CHLORIDE 0.9 % IV BOLUS (SEPSIS)
1000.0000 mL | INTRAVENOUS | Status: AC
  Administered 2013-08-07: 1000 mL via INTRAVENOUS

## 2013-08-07 MED ORDER — CEPHALEXIN 500 MG PO CAPS: 500.0000 mg | ORAL_CAPSULE | Freq: Four times a day (QID) | ORAL | Status: DC

## 2013-08-07 NOTE — ED Notes (Signed)
[facility] called for transport to facility. 

## 2013-08-07 NOTE — ED Provider Notes (Signed)
CSN: [id number]     Arrival date & time 08/07/13  0946 History   First MD Initiated Contact with Patient 08/07/13 [phone]     Chief Complaint  Patient presents with  . Fall     (Consider location/radiation/quality/duration/timing/severity/associated sxs/prior Treatment) Patient is a 80 y.o. female presenting with fall. History provided by: [facility] at [facility].  Fall This is a new problem. The current episode started less than 1 hour ago. Episode frequency: once. The problem has been resolved. Pertinent negatives include no chest pain, no abdominal pain, no headaches and no shortness of breath. Nothing aggravates the symptoms. Nothing relieves the symptoms. She has tried nothing for the symptoms. The treatment provided no relief.    Past Medical History  Diagnosis Date  . Diverticulosis of colon (without mention of hemorrhage) 06/24/2006  . Carcinoma of cecum 1992    History of  . Vitamin B12 deficiency   . Alzheimer's disease   . HTN (hypertension)   . Depression   . PVD (peripheral vascular disease)   . CAD (coronary artery disease)   . GERD (gastroesophageal reflux disease)   . H. pylori infection 2004  . Presbyesophagus   . Iron deficiency anemia, unspecified 01/02/2012  . Personal history of fall 01/04/2009  . Unspecified constipation   . Hemorrhage of rectum and anus   . Chest pain, unspecified   . Acute pharyngitis   . Malignant neoplasm of colon, unspecified site   . Edema   . Cataract   . HLD (hyperlipidemia)   . Osteoporosis, unspecified    Past Surgical History  Procedure Laterality Date  . Gallbladder surgery    . Abdominal hysterectomy  1995    with bso  . Right colectomy    . Cataract extraction  2001  . Cholecystectomy Right    Family History  Problem Relation Age of Onset  . Heart disease Mother   . Colon cancer Neg Hx    History  Substance Use Topics  . Smoking status: Former Smoker    Types: Cigarettes  . Smokeless tobacco: Never Used   . Alcohol Use: No   OB History   Grav Para Term Preterm Abortions TAB SAB Ect Mult Living                 Review of Systems  Constitutional: Negative for fever and fatigue.  HENT: Negative for congestion and drooling.   Eyes: Negative for pain.  Respiratory: Negative for cough and shortness of breath.   Cardiovascular: Negative for chest pain.  Gastrointestinal: Negative for nausea, vomiting, abdominal pain and diarrhea.  Genitourinary: Negative for dysuria and hematuria.  Musculoskeletal: Negative for back pain, gait problem and neck pain.  Skin: Negative for color change.  Neurological: Negative for dizziness and headaches.  Hematological: Negative for adenopathy.  Psychiatric/Behavioral: Negative for behavioral problems.  All other systems reviewed and are negative.     Allergies  Captopril-hydrochlorothiazide; Niacin and related; Urispas; and Verapamil  Home Medications   Prior to Admission medications   Medication Sig Start Date End Date Taking? Authorizing Provider  acetaminophen (TYLENOL) 650 MG CR tablet Take 650 mg by mouth 3 (three) times daily.   Yes Historical Provider, MD  alendronate (FOSAMAX) 70 MG tablet Take 70 mg by mouth every 7 (seven) days. Takes on Mondays. Take with a full glass of water on an empty stomach.   Yes Historical Provider, MD  amLODipine (NORVASC) 10 MG tablet Take 10 mg by mouth daily.   Yes Historical  Provider, MD  aspirin EC 81 MG tablet Take 81 mg by mouth daily.   Yes Historical Provider, MD  atorvastatin (LIPITOR) 10 MG tablet Take 5 mg by mouth at bedtime. Half a tab.   Yes Historical Provider, MD  Calcium Carbonate-Vitamin D (CALCIUM-VITAMIN D) 500-200 MG-UNIT per tablet Take 1 tablet by mouth daily.   Yes Historical Provider, MD  Cholecalciferol (VITAMIN D) 2000 UNITS tablet Take 2,000 Units by mouth daily.   Yes Historical Provider, MD  divalproex (DEPAKOTE SPRINKLE) 125 MG capsule Take 250 mg by mouth 2 (two) times daily.   Yes  Historical Provider, MD  donepezil (ARICEPT) 10 MG tablet Take 10 mg by mouth at bedtime.    Yes Historical Provider, MD  DULoxetine (CYMBALTA) 60 MG capsule Take 60 mg by mouth daily.   Yes Historical Provider, MD  ferrous sulfate 325 (65 FE) MG tablet Take 325 mg by mouth daily with breakfast.   Yes Historical Provider, MD  ipratropium-albuterol (DUONEB) 0.5-2.5 (3) MG/3ML SOLN Take 3 mLs by nebulization every 6 (six) hours as needed (shortness of breath.).   Yes Historical Provider, MD  lisinopril (PRINIVIL,ZESTRIL) 10 MG tablet Take 10 mg by mouth daily.   Yes Historical Provider, MD  metoprolol (LOPRESSOR) 25 MG tablet Take 1 tablet (25 mg total) by mouth 2 (two) times daily. If pulse <50 or SBP <100 hold med and notify MD. 04/08/13  Yes Tiffany L Reed, DO  Multiple Vitamin (MULTIVITAMIN WITH MINERALS) TABS tablet Take 1 tablet by mouth daily.   Yes Historical Provider, MD  Nutritional Supplements (TWOCAL HN) LIQD Take 90 mLs by mouth 2 (two) times daily.   Yes Historical Provider, MD  sennosides-docusate sodium (SENOKOT-S) 8.6-50 MG tablet Take 1 tablet by mouth daily.   Yes Historical Provider, MD  sucralfate (CARAFATE) 1 GM/10ML suspension Take 1 g by mouth 4 (four) times daily -  before meals and at bedtime.   Yes Historical Provider, MD  vitamin B-12 (CYANOCOBALAMIN) 1000 MCG tablet Take 1,000 mcg by mouth every other day.   Yes Historical Provider, MD   BP 144/41  Pulse 71  Temp(Src) 98.3 F (36.8 C) (Oral)  SpO2 94% Physical Exam  Nursing note and vitals reviewed. Constitutional: She appears well-developed and well-nourished.  HENT:  Mouth/Throat: Oropharynx is clear and moist. No oropharyngeal exudate.  Mild ecchymosis to left forehead.   No facial ttp.   Eyes: Conjunctivae and EOM are normal. Pupils are equal, round, and reactive to light.  Neck: Normal range of motion. Neck supple.  Cardiovascular: Normal rate, regular rhythm, normal heart sounds and intact distal pulses.   Exam reveals no gallop and no friction rub.   No murmur heard. Pulmonary/Chest: Effort normal and breath sounds normal. No respiratory distress. She has no wheezes.  Abdominal: Soft. Bowel sounds are normal. There is no tenderness. There is no rebound and no guarding.  Musculoskeletal: Normal range of motion. She exhibits no edema and no tenderness.  Normal strength in all extremities.   No ttp of hips. Normal rom of hips bilaterally.   No vertebral ttp.   Neurological: She is alert. She has normal strength. No sensory deficit.  A/o x1.   Skin: Skin is warm and dry.  Psychiatric: She has a normal mood and affect. Her behavior is normal.    ED Course  Procedures (including critical care time) Labs Review Labs Reviewed  CBC WITH DIFFERENTIAL - Abnormal; Notable for the following:    Hemoglobin 11.2 (*)  HCT 35.7 (*)    All other components within normal limits  BASIC METABOLIC PANEL - Abnormal; Notable for the following:    Sodium 149 (*)    GFR calc non Af Amer 77 (*)    GFR calc Af Amer 89 (*)    All other components within normal limits  URINALYSIS, ROUTINE W REFLEX MICROSCOPIC - Abnormal; Notable for the following:    APPearance TURBID (*)    Leukocytes, UA LARGE (*)    All other components within normal limits  URINE MICROSCOPIC-ADD ON - Abnormal; Notable for the following:    Bacteria, UA MANY (*)    All other components within normal limits    Imaging Review Ct Head Wo Contrast  08/07/2013   CLINICAL DATA:  [place] after standing up from a wheelchair, LEFT frontal bruising post fall, past history of cecal cancer, Alzheimer's, hypertension, coronary artery disease  EXAM: CT HEAD WITHOUT CONTRAST  CT CERVICAL SPINE WITHOUT CONTRAST  TECHNIQUE: Multidetector CT imaging of the head and cervical spine was performed following the standard protocol without intravenous contrast. Multiplanar CT image reconstructions of the cervical spine were also generated.  COMPARISON:   02/12/2013  FINDINGS: CT HEAD FINDINGS  Generalized atrophy.  Normal ventricular morphology.  No midline shift or mass effect.  Extensive small vessel chronic ischemic changes of deep cerebral white matter.  No intracranial hemorrhage, mass lesion or evidence acute infarction.  No extra-axial fluid collections.  Small bifrontal scalp hematomas.  Mild atherosclerotic calcifications at skullbase.  Visualized paranasal sinuses and mastoid air cells clear.  No acute calvarial abnormalities.  CT CERVICAL SPINE FINDINGS  Diffuse osseous demineralization.  Scattered motion artifacts.  Multilevel disc space narrowing and endplate spur formation.  Multilevel facet degenerative changes bilaterally.  Vertebral body heights maintained without fracture or subluxation.  Prevertebral soft tissues normal thickness.  Cystic changes at superior aspect of C3 vertebral body on RIGHT unchanged.  Lung apices clear.  Visualized skullbase intact.  Atherosclerotic calcification at LEFT carotid bifurcation.  IMPRESSION: Atrophy with extensive small vessel chronic ischemic changes of deep cerebral white matter.  No acute intracranial abnormalities.  Osseous demineralization with multilevel degenerative disc and facet disease changes of the cervical spine.  No acute cervical spine abnormalities.   Electronically Signed   By: Lavonia Dana M.D.   On: 08/07/2013 11:01   Ct Cervical Spine Wo Contrast  08/07/2013   CLINICAL DATA:  [place] after standing up from a wheelchair, LEFT frontal bruising post fall, past history of cecal cancer, Alzheimer's, hypertension, coronary artery disease  EXAM: CT HEAD WITHOUT CONTRAST  CT CERVICAL SPINE WITHOUT CONTRAST  TECHNIQUE: Multidetector CT imaging of the head and cervical spine was performed following the standard protocol without intravenous contrast. Multiplanar CT image reconstructions of the cervical spine were also generated.  COMPARISON:  02/12/2013  FINDINGS: CT HEAD FINDINGS  Generalized atrophy.   Normal ventricular morphology.  No midline shift or mass effect.  Extensive small vessel chronic ischemic changes of deep cerebral white matter.  No intracranial hemorrhage, mass lesion or evidence acute infarction.  No extra-axial fluid collections.  Small bifrontal scalp hematomas.  Mild atherosclerotic calcifications at skullbase.  Visualized paranasal sinuses and mastoid air cells clear.  No acute calvarial abnormalities.  CT CERVICAL SPINE FINDINGS  Diffuse osseous demineralization.  Scattered motion artifacts.  Multilevel disc space narrowing and endplate spur formation.  Multilevel facet degenerative changes bilaterally.  Vertebral body heights maintained without fracture or subluxation.  Prevertebral soft tissues normal thickness.  Cystic changes at superior aspect of C3 vertebral body on RIGHT unchanged.  Lung apices clear.  Visualized skullbase intact.  Atherosclerotic calcification at LEFT carotid bifurcation.  IMPRESSION: Atrophy with extensive small vessel chronic ischemic changes of deep cerebral white matter.  No acute intracranial abnormalities.  Osseous demineralization with multilevel degenerative disc and facet disease changes of the cervical spine.  No acute cervical spine abnormalities.   Electronically Signed   By: Lavonia Dana M.D.   On: 08/07/2013 11:01     EKG Interpretation   Date/Time:  Friday August 07 2013 11:17:22 EDT Ventricular Rate:  71 PR Interval:  170 QRS Duration: 77 QT Interval:  432 QTC Calculation: 469 R Axis:   12 Text Interpretation:  Sinus rhythm Probable anteroseptal infarct, old  Baseline wander in lead(s) V1 No significant change since last tracing  Confirmed by Jahlisa Rossitto  MD, Denyse Fillion ([id number]) on 08/07/2013 12:35:36 PM      MDM   Final diagnoses:  Hypernatremia  UTI (lower urinary tract infection)  Fall, initial encounter  Contusion of head, initial encounter    10:17 AM 80 y.o. female who presents with unwitnessed fall from her facility. I spoke  with Tr[facility]ace at her facility he states that the patient had a unwitnessed fall and was found several feet from her wheelchair laying on her left side. She notes that the patient must be redirected frequently and tries to get up out of her wheelchair but is a fall risk. The patient is afebrile and vital signs are unremarkable here. She is alert and oriented x1. She denies any pain. Will get screening labs and imaging.  2:34 PM: UA suspicious for UTI. Na elevated. Gave 1L IVF here. Pt will need recheck of Na next week. Will send home on keflex. I gave 1g rocephin here. I have discussed the diagnosis/risks/treatment options with the patient and believe the pt to be eligible for discharge home to follow-up with pcp next week. We also discussed returning to the ED immediately if new or worsening sx occur. We discussed the sx which are most concerning (e.g., AMS, recurrent falls) that necessitate immediate return. Medications administered to the patient during their visit and any new prescriptions provided to the patient are listed below.  Medications given during this visit Medications  sodium chloride 0.9 % bolus 1,000 mL (0 mLs Intravenous Stopped 08/07/13 1421)  cefTRIAXone (ROCEPHIN) 1 g in dextrose 5 % 50 mL IVPB (0 g Intravenous Stopped 08/07/13 1421)    New Prescriptions   CEPHALEXIN (KEFLEX) 500 MG CAPSULE    Take 1 capsule (500 mg total) by mouth 4 (four) times daily.      Blanchard [name], MD 08/07/13 [phone]

## 2013-08-07 NOTE — ED Notes (Signed)
Per EMS pt fell after rising to standing position from wheel chair

## 2013-08-07 NOTE — ED Notes (Signed)
Patient transported to CT 

## 2013-08-07 NOTE — ED Notes (Signed)
Bed: WA03 Expected date:  Expected time:  Means of arrival:  Comments: EMS-fall-hematoma

## 2013-08-19 ENCOUNTER — Non-Acute Institutional Stay (SKILLED_NURSING_FACILITY): Payer: Medicare Other | Admitting: Internal Medicine

## 2013-08-19 DIAGNOSIS — M810 Age-related osteoporosis without current pathological fracture: Secondary | ICD-10-CM

## 2013-08-19 DIAGNOSIS — F0281 Dementia in other diseases classified elsewhere with behavioral disturbance: Secondary | ICD-10-CM

## 2013-08-19 DIAGNOSIS — D509 Iron deficiency anemia, unspecified: Secondary | ICD-10-CM

## 2013-08-19 DIAGNOSIS — Z9181 History of falling: Secondary | ICD-10-CM

## 2013-08-19 DIAGNOSIS — F32A Depression, unspecified: Secondary | ICD-10-CM

## 2013-08-19 DIAGNOSIS — F0390 Unspecified dementia without behavioral disturbance: Secondary | ICD-10-CM

## 2013-08-19 DIAGNOSIS — F22 Delusional disorders: Secondary | ICD-10-CM

## 2013-08-19 DIAGNOSIS — F329 Major depressive disorder, single episode, unspecified: Secondary | ICD-10-CM

## 2013-08-19 DIAGNOSIS — F02818 Dementia in other diseases classified elsewhere, unspecified severity, with other behavioral disturbance: Secondary | ICD-10-CM

## 2013-08-19 DIAGNOSIS — F3289 Other specified depressive episodes: Secondary | ICD-10-CM

## 2013-08-19 DIAGNOSIS — G301 Alzheimer's disease with late onset: Secondary | ICD-10-CM

## 2013-08-19 DIAGNOSIS — F0280 Dementia in other diseases classified elsewhere without behavioral disturbance: Secondary | ICD-10-CM

## 2013-08-19 DIAGNOSIS — R296 Repeated falls: Secondary | ICD-10-CM

## 2013-08-19 DIAGNOSIS — F0392 Unspecified dementia, unspecified severity, with psychotic disturbance: Secondary | ICD-10-CM

## 2013-08-19 DIAGNOSIS — G309 Alzheimer's disease, unspecified: Secondary | ICD-10-CM

## 2013-08-19 DIAGNOSIS — I10 Essential (primary) hypertension: Secondary | ICD-10-CM

## 2013-10-21 ENCOUNTER — Encounter: Payer: Self-pay | Admitting: Internal Medicine

## 2013-10-21 ENCOUNTER — Non-Acute Institutional Stay (SKILLED_NURSING_FACILITY): Payer: Medicare Other | Admitting: Internal Medicine

## 2013-10-21 DIAGNOSIS — F0281 Dementia in other diseases classified elsewhere with behavioral disturbance: Secondary | ICD-10-CM

## 2013-10-21 DIAGNOSIS — Z9181 History of falling: Secondary | ICD-10-CM

## 2013-10-21 DIAGNOSIS — F329 Major depressive disorder, single episode, unspecified: Secondary | ICD-10-CM

## 2013-10-21 DIAGNOSIS — M159 Polyosteoarthritis, unspecified: Secondary | ICD-10-CM

## 2013-10-21 DIAGNOSIS — G309 Alzheimer's disease, unspecified: Secondary | ICD-10-CM

## 2013-10-21 DIAGNOSIS — M810 Age-related osteoporosis without current pathological fracture: Secondary | ICD-10-CM

## 2013-10-21 DIAGNOSIS — G301 Alzheimer's disease with late onset: Secondary | ICD-10-CM

## 2013-10-21 DIAGNOSIS — F0392 Unspecified dementia, unspecified severity, with psychotic disturbance: Secondary | ICD-10-CM

## 2013-10-21 DIAGNOSIS — M150 Primary generalized (osteo)arthritis: Secondary | ICD-10-CM

## 2013-10-21 DIAGNOSIS — R296 Repeated falls: Secondary | ICD-10-CM

## 2013-10-21 DIAGNOSIS — D509 Iron deficiency anemia, unspecified: Secondary | ICD-10-CM

## 2013-10-21 DIAGNOSIS — F32A Depression, unspecified: Secondary | ICD-10-CM

## 2013-10-21 DIAGNOSIS — F22 Delusional disorders: Secondary | ICD-10-CM

## 2013-10-21 DIAGNOSIS — F0280 Dementia in other diseases classified elsewhere without behavioral disturbance: Secondary | ICD-10-CM

## 2013-10-21 DIAGNOSIS — F02818 Dementia in other diseases classified elsewhere, unspecified severity, with other behavioral disturbance: Secondary | ICD-10-CM

## 2013-10-21 DIAGNOSIS — M8949 Other hypertrophic osteoarthropathy, multiple sites: Secondary | ICD-10-CM

## 2013-10-21 DIAGNOSIS — F0390 Unspecified dementia without behavioral disturbance: Secondary | ICD-10-CM

## 2013-10-21 DIAGNOSIS — F3289 Other specified depressive episodes: Secondary | ICD-10-CM

## 2013-10-21 NOTE — Progress Notes (Signed)
Patient ID: [name], female   DOB: 1922/08/08, 80 y.o.   MRN: [id number]  Location: [facility] Provider:  Rexene Edison. Mariea Clonts, D.O., C.M.D.  Code Status:  DNR  Chief Complaint  Patient presents with  . Medical Management of Chronic Issues    HPI:  80 yo white female long term care resident with Alzheimer's, frequent falls, senile  and arthritis seen for med mgt chronic diseases.  She c/o aching all over today and says sometimes she is so tired in the morning that she just doesn't want to get oob.  She denies feeling sad or depressed.   Review of Systems:  Review of Systems  Constitutional: Positive for malaise/fatigue. Negative for fever.  Eyes: Negative for blurred vision.  Respiratory: Negative for shortness of breath.   Cardiovascular: Negative for chest pain.  Gastrointestinal: Negative for constipation.  Genitourinary: Negative for dysuria.  Musculoskeletal: Positive for falls and joint pain.  Neurological: Negative for dizziness, loss of consciousness and headaches.  Endo/Heme/Allergies: Bruises/bleeds easily.  Psychiatric/Behavioral: Positive for memory loss.    Medications: Patient's Medications  New Prescriptions   No medications on file  Previous Medications   ACETAMINOPHEN (TYLENOL) 650 MG CR TABLET    Take 650 mg by mouth 3 (three) times daily.   ALENDRONATE (FOSAMAX) 70 MG TABLET    Take 70 mg by mouth every 7 (seven) days. Takes on Mondays. Take with a full glass of water on an empty stomach.   AMLODIPINE (NORVASC) 10 MG TABLET    Take 10 mg by mouth daily.   ASPIRIN EC 81 MG TABLET    Take 81 mg by mouth daily.   ATORVASTATIN (LIPITOR) 10 MG TABLET    Take 5 mg by mouth at bedtime. Half a tab.   CALCIUM CARBONATE-VITAMIN D (CALCIUM-VITAMIN D) 500-200 MG-UNIT PER TABLET    Take 1 tablet by mouth daily.   CHOLECALCIFEROL (VITAMIN D) 2000 UNITS TABLET    Take 2,000 Units by mouth daily.   DIVALPROEX (DEPAKOTE SPRINKLE) 125 MG CAPSULE    Take 250  mg by mouth 2 (two) times daily.   DONEPEZIL (ARICEPT) 10 MG TABLET    Take 10 mg by mouth at bedtime.    DULOXETINE (CYMBALTA) 60 MG CAPSULE    Take 60 mg by mouth daily.   FERROUS SULFATE 325 (65 FE) MG TABLET    Take 325 mg by mouth daily with breakfast.   IPRATROPIUM-ALBUTEROL (DUONEB) 0.5-2.5 (3) MG/3ML SOLN    Take 3 mLs by nebulization every 6 (six) hours as needed (shortness of breath.).   LISINOPRIL (PRINIVIL,ZESTRIL) 10 MG TABLET    Take 10 mg by mouth daily.   METOPROLOL (LOPRESSOR) 25 MG TABLET    Take 1 tablet (25 mg total) by mouth 2 (two) times daily. If pulse <50 or SBP <100 hold med and notify MD.   MULTIPLE VITAMIN (MULTIVITAMIN WITH MINERALS) TABS TABLET    Take 1 tablet by mouth daily.   NUTRITIONAL SUPPLEMENTS (TWOCAL HN) LIQD    Take 90 mLs by mouth 2 (two) times daily.   SENNOSIDES-DOCUSATE SODIUM (SENOKOT-S) 8.6-50 MG TABLET    Take 1 tablet by mouth daily.   SUCRALFATE (CARAFATE) 1 GM/10ML SUSPENSION    Take 1 g by mouth 4 (four) times daily -  before meals and at bedtime.   VITAMIN B-12 (CYANOCOBALAMIN) 1000 MCG TABLET    Take 1,000 mcg by mouth every other day.  Modified Medications   No medications on file  Discontinued Medications   CEPHALEXIN (KEFLEX) 500 MG CAPSULE    Take 1 capsule (500 mg total) by mouth 4 (four) times daily.    Physical Exam: Filed Vitals:   10/21/13 2205  BP: 119/78  Pulse: 68  Temp: 98 F (36.7 C)  Resp: 20  Height: 5\' 1"  (1.549 m)  Weight: 120 lb (54.432 kg)   Physical Exam  Constitutional: She appears well-nourished. No distress.  Cardiovascular: Normal rate, regular rhythm, normal heart sounds and intact distal pulses.   Pulmonary/Chest: Effort normal and breath sounds normal. No respiratory distress.  Abdominal: Soft. Bowel sounds are normal. She exhibits no distension and no mass. There is no tenderness.  Musculoskeletal: Normal range of motion.  Neurological: She is alert.  Skin: Skin is warm and dry. There is pallor.    Psychiatric: She has a normal mood and affect.     Labs reviewed: Basic Metabolic Panel:  Recent Labs  08/07/13 1053  NA 149*  K 3.9  CL 109  CO2 29  GLUCOSE 84  BUN 17  CREATININE 0.66  CALCIUM 9.4    Liver Function Tests: No results found for this basename: AST, ALT, ALKPHOS, BILITOT, PROT, ALBUMIN,  in the last 8760 hours  CBC:  Recent Labs  08/07/13 1053  WBC 6.4  NEUTROABS 4.4  HGB 11.2*  HCT 35.7*  MCV 89.9  PLT 195   Assessment/Plan 1. Dementia of the Alzheimer's type, with late onset, with delusions -has been stable recently, no changes needed -continues on aricept, not on namenda (unsure why)  2. Recurrent falls -doing better off lorazepam and other meds known to increase falls in elderly dementia patients  3. Senile osteoporosis -cont vitamin D  4. Anemia, iron deficiency -cont iron supplement  5. Depression -cont cymbalta, depakote  6. Primary osteoarthritis involving multiple joints -increase tylenol from 650mg  po tid to 1000mg  po tid   Family/ staff Communication: discussed with her nurse  Goals of care: long term care resident, DNR code status, need to review MOST form  Labs/tests ordered:  None today

## 2013-12-09 ENCOUNTER — Non-Acute Institutional Stay (SKILLED_NURSING_FACILITY): Payer: Medicare Other | Admitting: Internal Medicine

## 2013-12-09 ENCOUNTER — Encounter: Payer: Self-pay | Admitting: Internal Medicine

## 2013-12-09 DIAGNOSIS — R410 Disorientation, unspecified: Secondary | ICD-10-CM

## 2013-12-09 DIAGNOSIS — N3000 Acute cystitis without hematuria: Secondary | ICD-10-CM

## 2013-12-09 DIAGNOSIS — A499 Bacterial infection, unspecified: Secondary | ICD-10-CM | POA: Diagnosis not present

## 2013-12-09 DIAGNOSIS — Z1612 Extended spectrum beta lactamase (ESBL) resistance: Secondary | ICD-10-CM | POA: Diagnosis not present

## 2013-12-09 NOTE — Progress Notes (Signed)
Patient ID: [name], female   DOB: [birth year], 80 y.o.   MRN: [id number]  Location:  [facility] Provider:  Rexene Edison. Mariea Clonts, D.O., C.M.D.  Code Status:  DNR  Chief Complaint  Patient presents with  . Acute Visit    UTI  . Medical Management of Chronic Issues    HPI:  Frail 81 yo white female long term care resident with late stage dementia, recurrent falls, recurrent hyponatremia, recurrent UTIs, depression and anemia seen for an acute visit due to a UTI found on her labwork and for med mgt of her chronic diseases.  Her bps are good in the 627O to 350K systolic with two outliers in the 150s over the past month.  Her HR in the 60s-70s.  Weight has been stable for several mos in the lower 120s.     Review of Systems:  Review of Systems  Constitutional: Positive for malaise/fatigue. Negative for fever, chills and weight loss.  HENT: Negative for congestion.   Respiratory: Negative for shortness of breath.   Cardiovascular: Negative for chest pain.  Gastrointestinal: Positive for abdominal pain. Negative for constipation, blood in stool and melena.  Genitourinary: Negative for dysuria, urgency, frequency and hematuria.  Musculoskeletal: Negative for myalgias and joint pain.  Neurological: Positive for dizziness and weakness.  Psychiatric/Behavioral: Positive for memory loss. Negative for depression.    Medications: Patient's Medications  New Prescriptions   No medications on file  Previous Medications   ACETAMINOPHEN (TYLENOL) 650 MG CR TABLET    Take 650 mg by mouth 3 (three) times daily.   ALENDRONATE (FOSAMAX) 70 MG TABLET    Take 70 mg by mouth every 7 (seven) days. Takes on Mondays. Take with a full glass of water on an empty stomach.   AMLODIPINE (NORVASC) 10 MG TABLET    Take 10 mg by mouth daily.   ASPIRIN EC 81 MG TABLET    Take 81 mg by mouth daily.   ATORVASTATIN (LIPITOR) 10 MG TABLET    Take 5 mg by mouth at bedtime. Half a tab.   CALCIUM  CARBONATE-VITAMIN D (CALCIUM-VITAMIN D) 500-200 MG-UNIT PER TABLET    Take 1 tablet by mouth daily.   CHOLECALCIFEROL (VITAMIN D) 2000 UNITS TABLET    Take 2,000 Units by mouth daily.   DIVALPROEX (DEPAKOTE SPRINKLE) 125 MG CAPSULE    Take 125 mg by mouth 2 (two) times daily.    DONEPEZIL (ARICEPT) 10 MG TABLET    Take 10 mg by mouth at bedtime.    DULOXETINE (CYMBALTA) 60 MG CAPSULE    Take 60 mg by mouth daily. And 20mg  po daily   FERROUS SULFATE 325 (65 FE) MG TABLET    Take 325 mg by mouth daily with breakfast.   IPRATROPIUM-ALBUTEROL (DUONEB) 0.5-2.5 (3) MG/3ML SOLN    Take 3 mLs by nebulization every 6 (six) hours as needed (shortness of breath.).   LISINOPRIL (PRINIVIL,ZESTRIL) 10 MG TABLET    Take 10 mg by mouth daily.   METOPROLOL (LOPRESSOR) 25 MG TABLET    Take 1 tablet (25 mg total) by mouth 2 (two) times daily. If pulse <50 or SBP <100 hold med and notify MD.   MULTIPLE VITAMIN (MULTIVITAMIN WITH MINERALS) TABS TABLET    Take 1 tablet by mouth daily.   NUTRITIONAL SUPPLEMENTS (TWOCAL HN) LIQD    Take 90 mLs by mouth 2 (two) times daily.   SENNOSIDES-DOCUSATE SODIUM (SENOKOT-S) 8.6-50 MG TABLET    Take 1 tablet by mouth  daily.   SUCRALFATE (CARAFATE) 1 GM/10ML SUSPENSION    Take 1 g by mouth 4 (four) times daily -  before meals and at bedtime.   VITAMIN B-12 (CYANOCOBALAMIN) 1000 MCG TABLET    Take 1,000 mcg by mouth every other day.  Modified Medications   No medications on file  Discontinued Medications   No medications on file    Physical Exam: Filed Vitals:   12/09/13 1201  BP: 130/64  Pulse: 70  Temp: 97 F (36.1 C)  Resp: 18  Height: 5\' 1"  (1.549 m)  Weight: 121 lb (54.885 kg)  SpO2: 96%  Physical Exam  Constitutional:  Frail white female resting in bed, lethargic, weak  Cardiovascular: Regular rhythm, normal heart sounds and intact distal pulses.   Pulmonary/Chest: Effort normal and breath sounds normal. No respiratory distress. She has no wheezes. She has no  rales.  Abdominal: Soft. Bowel sounds are normal. She exhibits no distension and no mass. There is tenderness.  Musculoskeletal: Normal range of motion.  Neurological:  arousable but lethargic  Skin: There is pallor.  Psychiatric: She has a normal mood and affect.     Labs reviewed: Basic Metabolic Panel:  Recent Labs  08/07/13 1053  NA 149*  K 3.9  CL 109  CO2 29  GLUCOSE 84  BUN 17  CREATININE 0.66  CALCIUM 9.4    Liver Function Tests: No results for input(s): AST, ALT, ALKPHOS, BILITOT, PROT, ALBUMIN in the last 8760 hours.  CBC:  Recent Labs  08/07/13 1053  WBC 6.4  NEUTROABS 4.4  HGB 11.2*  HCT 35.7*  MCV 89.9  PLT 195   ESBL UTI   Assessment/Plan 1. ESBL (extended spectrum beta-lactamase) producing bacteria infection Push po Fluids NS at 60cc/hr for a liter Zosyn 3.375g IV q6hrs for 7 days  2. Acute cystitis without hematuria Push po Fluids NS at 60cc/hr for a liter Zosyn 3.375g IV q6hrs for 7 days  3.  Delirium on dementia cbc, bmp stat Push po Fluids NS at 60cc/hr for a liter Zosyn 3.375g IV q6hrs for 7 days  Family/ staff Communication: discussed with nurse  Goals of care: DNR, treating reversible conditions; need to discuss hospitalizations with family--in her best interests to stay here with her advanced dementia to avoid worsening confusion at this point  Labs/tests ordered:  Cbc, bmp stat

## 2013-12-14 ENCOUNTER — Encounter (HOSPITAL_COMMUNITY): Payer: Self-pay | Admitting: Emergency Medicine

## 2013-12-14 ENCOUNTER — Emergency Department (HOSPITAL_COMMUNITY): Payer: Medicare Other

## 2013-12-14 ENCOUNTER — Inpatient Hospital Stay (HOSPITAL_COMMUNITY)
Admission: EM | Admit: 2013-12-14 | Discharge: 2013-12-17 | DRG: 470 | Disposition: A | Discharge status: Skilled Nursing Facility | Payer: Medicare Other | Attending: Internal Medicine | Admitting: Internal Medicine

## 2013-12-14 DIAGNOSIS — Y92129 Unspecified place in nursing home as the place of occurrence of the external cause: Secondary | ICD-10-CM

## 2013-12-14 DIAGNOSIS — I10 Essential (primary) hypertension: Secondary | ICD-10-CM | POA: Diagnosis present

## 2013-12-14 DIAGNOSIS — Z7901 Long term (current) use of anticoagulants: Secondary | ICD-10-CM

## 2013-12-14 DIAGNOSIS — M4856XA Collapsed vertebra, not elsewhere classified, lumbar region, initial encounter for fracture: Secondary | ICD-10-CM | POA: Diagnosis present

## 2013-12-14 DIAGNOSIS — I4581 Long QT syndrome: Secondary | ICD-10-CM | POA: Diagnosis present

## 2013-12-14 DIAGNOSIS — Z66 Do not resuscitate: Secondary | ICD-10-CM | POA: Diagnosis present

## 2013-12-14 DIAGNOSIS — R54 Age-related physical debility: Secondary | ICD-10-CM | POA: Diagnosis present

## 2013-12-14 DIAGNOSIS — R1310 Dysphagia, unspecified: Secondary | ICD-10-CM | POA: Diagnosis present

## 2013-12-14 DIAGNOSIS — Z79899 Other long term (current) drug therapy: Secondary | ICD-10-CM | POA: Diagnosis not present

## 2013-12-14 DIAGNOSIS — W19XXXA Unspecified fall, initial encounter: Secondary | ICD-10-CM | POA: Diagnosis present

## 2013-12-14 DIAGNOSIS — K219 Gastro-esophageal reflux disease without esophagitis: Secondary | ICD-10-CM | POA: Diagnosis present

## 2013-12-14 DIAGNOSIS — D649 Anemia, unspecified: Secondary | ICD-10-CM | POA: Diagnosis present

## 2013-12-14 DIAGNOSIS — F0280 Dementia in other diseases classified elsewhere without behavioral disturbance: Secondary | ICD-10-CM | POA: Diagnosis present

## 2013-12-14 DIAGNOSIS — N390 Urinary tract infection, site not specified: Secondary | ICD-10-CM | POA: Diagnosis present

## 2013-12-14 DIAGNOSIS — Z8249 Family history of ischemic heart disease and other diseases of the circulatory system: Secondary | ICD-10-CM

## 2013-12-14 DIAGNOSIS — E538 Deficiency of other specified B group vitamins: Secondary | ICD-10-CM | POA: Diagnosis present

## 2013-12-14 DIAGNOSIS — E785 Hyperlipidemia, unspecified: Secondary | ICD-10-CM | POA: Diagnosis present

## 2013-12-14 DIAGNOSIS — Z7982 Long term (current) use of aspirin: Secondary | ICD-10-CM | POA: Diagnosis not present

## 2013-12-14 DIAGNOSIS — F22 Delusional disorders: Secondary | ICD-10-CM

## 2013-12-14 DIAGNOSIS — Z87891 Personal history of nicotine dependence: Secondary | ICD-10-CM

## 2013-12-14 DIAGNOSIS — I2510 Atherosclerotic heart disease of native coronary artery without angina pectoris: Secondary | ICD-10-CM | POA: Diagnosis present

## 2013-12-14 DIAGNOSIS — R296 Repeated falls: Secondary | ICD-10-CM | POA: Diagnosis present

## 2013-12-14 DIAGNOSIS — K5900 Constipation, unspecified: Secondary | ICD-10-CM | POA: Diagnosis present

## 2013-12-14 DIAGNOSIS — M810 Age-related osteoporosis without current pathological fracture: Secondary | ICD-10-CM | POA: Diagnosis present

## 2013-12-14 DIAGNOSIS — S72001A Fracture of unspecified part of neck of right femur, initial encounter for closed fracture: Principal | ICD-10-CM | POA: Diagnosis present

## 2013-12-14 DIAGNOSIS — G301 Alzheimer's disease with late onset: Secondary | ICD-10-CM | POA: Diagnosis present

## 2013-12-14 DIAGNOSIS — F0281 Dementia in other diseases classified elsewhere with behavioral disturbance: Secondary | ICD-10-CM | POA: Diagnosis present

## 2013-12-14 LAB — CBC WITH DIFFERENTIAL/PLATELET
BASOS ABS: 0 10*3/uL (ref 0.0–0.1)
Basophils Relative: 0 % (ref 0–1)
Eosinophils Absolute: 0.4 10*3/uL (ref 0.0–0.7)
Eosinophils Relative: 3 % (ref 0–5)
HCT: 35.6 % — ABNORMAL LOW (ref 36.0–46.0)
HEMOGLOBIN: 11.7 g/dL — AB (ref 12.0–15.0)
LYMPHS ABS: 1.7 10*3/uL (ref 0.7–4.0)
LYMPHS PCT: 14 % (ref 12–46)
MCH: 29.5 pg (ref 26.0–34.0)
MCHC: 32.9 g/dL (ref 30.0–36.0)
MCV: 89.9 fL (ref 78.0–100.0)
MONO ABS: 0.8 10*3/uL (ref 0.1–1.0)
MONOS PCT: 6 % (ref 3–12)
NEUTROS ABS: 9.5 10*3/uL — AB (ref 1.7–7.7)
Neutrophils Relative %: 77 % (ref 43–77)
Platelets: 239 10*3/uL (ref 150–400)
RBC: 3.96 MIL/uL (ref 3.87–5.11)
RDW: 13.3 % (ref 11.5–15.5)
WBC: 12.4 10*3/uL — AB (ref 4.0–10.5)

## 2013-12-14 LAB — URINALYSIS, ROUTINE W REFLEX MICROSCOPIC
Bilirubin Urine: NEGATIVE
Glucose, UA: NEGATIVE mg/dL
Ketones, ur: NEGATIVE mg/dL
Leukocytes, UA: NEGATIVE
NITRITE: NEGATIVE
PROTEIN: NEGATIVE mg/dL
Specific Gravity, Urine: 1.024 (ref 1.005–1.030)
UROBILINOGEN UA: 0.2 mg/dL (ref 0.0–1.0)
pH: 5.5 (ref 5.0–8.0)

## 2013-12-14 LAB — CBC
HEMATOCRIT: 35.6 % — AB (ref 36.0–46.0)
Hemoglobin: 11.7 g/dL — ABNORMAL LOW (ref 12.0–15.0)
MCH: 29.2 pg (ref 26.0–34.0)
MCHC: 32.9 g/dL (ref 30.0–36.0)
MCV: 88.8 fL (ref 78.0–100.0)
Platelets: 234 10*3/uL (ref 150–400)
RBC: 4.01 MIL/uL (ref 3.87–5.11)
RDW: 13.3 % (ref 11.5–15.5)
WBC: 11.2 10*3/uL — AB (ref 4.0–10.5)

## 2013-12-14 LAB — BASIC METABOLIC PANEL
ANION GAP: 13 (ref 5–15)
BUN: 32 mg/dL — ABNORMAL HIGH (ref 6–23)
CHLORIDE: 106 meq/L (ref 96–112)
CO2: 23 mEq/L (ref 19–32)
Calcium: 9.7 mg/dL (ref 8.4–10.5)
Creatinine, Ser: 0.84 mg/dL (ref 0.50–1.10)
GFR calc Af Amer: 69 mL/min — ABNORMAL LOW (ref >90)
GFR calc non Af Amer: 60 mL/min — ABNORMAL LOW (ref >90)
Glucose, Bld: 111 mg/dL — ABNORMAL HIGH (ref 70–99)
Potassium: 4.1 mEq/L (ref 3.7–5.3)
SODIUM: 142 meq/L (ref 137–147)

## 2013-12-14 LAB — URINE MICROSCOPIC-ADD ON

## 2013-12-14 LAB — VALPROIC ACID LEVEL: Valproic Acid Lvl: <10 ug/mL — ABNORMAL LOW (ref 50.0–100.0)

## 2013-12-14 LAB — CREATININE, SERUM
Creatinine, Ser: 0.79 mg/dL (ref 0.50–1.10)
GFR calc non Af Amer: 72 mL/min — ABNORMAL LOW (ref >90)
GFR, EST AFRICAN AMERICAN: 83 mL/min — AB (ref >90)

## 2013-12-14 LAB — PROTIME-INR
INR: 1.26 (ref 0.00–1.49)
Prothrombin Time: 15.9 seconds — ABNORMAL HIGH (ref 11.6–15.2)

## 2013-12-14 LAB — TYPE AND SCREEN
ABO/RH(D): O NEG
ANTIBODY SCREEN: NEGATIVE

## 2013-12-14 LAB — ABO/RH: ABO/RH(D): O NEG

## 2013-12-14 MED ORDER — OSMOLITE 1.5 CAL PO LIQD
90.0000 mL | Freq: Three times a day (TID) | ORAL | Status: DC
  Administered 2013-12-15 – 2013-12-17 (×6): 90 mL via ORAL
  Filled 2013-12-14 (×12): qty 237

## 2013-12-14 MED ORDER — ALENDRONATE SODIUM 70 MG PO TABS: 70.0000 mg | ORAL_TABLET | ORAL | Status: DC

## 2013-12-14 MED ORDER — METOPROLOL TARTRATE 25 MG PO TABS
25.0000 mg | ORAL_TABLET | Freq: Two times a day (BID) | ORAL | Status: DC
  Administered 2013-12-15 – 2013-12-16 (×5): 25 mg via ORAL
  Filled 2013-12-14 (×7): qty 1

## 2013-12-14 MED ORDER — ACETAMINOPHEN 500 MG PO TABS: 1000.0000 mg | ORAL_TABLET | Freq: Three times a day (TID) | ORAL | Status: DC | PRN

## 2013-12-14 MED ORDER — DIVALPROEX SODIUM 125 MG PO CPSP
125.0000 mg | ORAL_CAPSULE | Freq: Two times a day (BID) | ORAL | Status: DC
  Administered 2013-12-15 – 2013-12-17 (×6): 125 mg via ORAL
  Filled 2013-12-14 (×8): qty 1

## 2013-12-14 MED ORDER — AMLODIPINE BESYLATE 10 MG PO TABS
10.0000 mg | ORAL_TABLET | Freq: Every day | ORAL | Status: DC
  Administered 2013-12-16: 10 mg via ORAL
  Filled 2013-12-14 (×3): qty 1

## 2013-12-14 MED ORDER — SUCRALFATE 1 GM/10ML PO SUSP
1.0000 g | Freq: Three times a day (TID) | ORAL | Status: DC
  Administered 2013-12-15 – 2013-12-17 (×7): 1 g via ORAL
  Filled 2013-12-14 (×14): qty 10

## 2013-12-14 MED ORDER — DONEPEZIL HCL 10 MG PO TABS
10.0000 mg | ORAL_TABLET | Freq: Every day | ORAL | Status: DC
  Administered 2013-12-15 – 2013-12-17 (×3): 10 mg via ORAL
  Filled 2013-12-14 (×4): qty 1

## 2013-12-14 MED ORDER — IPRATROPIUM-ALBUTEROL 0.5-2.5 (3) MG/3ML IN SOLN: 3.0000 mL | Freq: Four times a day (QID) | RESPIRATORY_TRACT | Status: DC | PRN

## 2013-12-14 MED ORDER — SODIUM CHLORIDE 0.9 % IV SOLN
1.0000 g | INTRAVENOUS | Status: DC
  Administered 2013-12-14 – 2013-12-16 (×3): 1 g via INTRAVENOUS
  Filled 2013-12-14 (×4): qty 1

## 2013-12-14 MED ORDER — SENNOSIDES-DOCUSATE SODIUM 8.6-50 MG PO TABS
1.0000 | ORAL_TABLET | Freq: Every day | ORAL | Status: DC
  Administered 2013-12-15: 1 via ORAL
  Filled 2013-12-14 (×2): qty 1

## 2013-12-14 MED ORDER — HEPARIN SODIUM (PORCINE) 5000 UNIT/ML IJ SOLN
5000.0000 [IU] | Freq: Three times a day (TID) | INTRAMUSCULAR | Status: DC
  Administered 2013-12-15 – 2013-12-17 (×6): 5000 [IU] via SUBCUTANEOUS
  Filled 2013-12-14 (×11): qty 1

## 2013-12-14 MED ORDER — MORPHINE SULFATE 2 MG/ML IJ SOLN
1.0000 mg | INTRAMUSCULAR | Status: DC | PRN
  Administered 2013-12-14 – 2013-12-15 (×2): 2 mg via INTRAVENOUS
  Filled 2013-12-14 (×2): qty 1

## 2013-12-14 MED ORDER — SODIUM CHLORIDE 0.9 % IJ SOLN
3.0000 mL | Freq: Two times a day (BID) | INTRAMUSCULAR | Status: DC
  Administered 2013-12-16: 3 mL via INTRAVENOUS

## 2013-12-14 MED ORDER — SODIUM CHLORIDE 0.9 % IV SOLN
INTRAVENOUS | Status: DC
  Administered 2013-12-14 – 2013-12-15 (×2): via INTRAVENOUS

## 2013-12-14 MED ORDER — CALCIUM CARBONATE-VITAMIN D 500-200 MG-UNIT PO TABS
1.0000 | ORAL_TABLET | Freq: Every day | ORAL | Status: DC
  Administered 2013-12-15 – 2013-12-17 (×3): 1 via ORAL
  Filled 2013-12-14 (×4): qty 1

## 2013-12-14 MED ORDER — DULOXETINE HCL 20 MG PO CPEP
20.0000 mg | ORAL_CAPSULE | Freq: Every day | ORAL | Status: DC
  Administered 2013-12-15 – 2013-12-16 (×2): 20 mg via ORAL
  Filled 2013-12-14 (×4): qty 1

## 2013-12-14 MED ORDER — VITAMIN D 1000 UNITS PO TABS
2000.0000 [IU] | ORAL_TABLET | Freq: Every day | ORAL | Status: DC
  Administered 2013-12-16 – 2013-12-17 (×2): 2000 [IU] via ORAL
  Filled 2013-12-14 (×3): qty 2

## 2013-12-14 MED ORDER — FERROUS SULFATE 325 (65 FE) MG PO TABS
325.0000 mg | ORAL_TABLET | Freq: Every day | ORAL | Status: DC
  Administered 2013-12-16 – 2013-12-17 (×2): 325 mg via ORAL
  Filled 2013-12-14 (×4): qty 1

## 2013-12-14 MED ORDER — ATORVASTATIN CALCIUM 10 MG PO TABS
5.0000 mg | ORAL_TABLET | Freq: Every day | ORAL | Status: DC
  Administered 2013-12-15 – 2013-12-16 (×3): 5 mg via ORAL
  Filled 2013-12-14 (×4): qty 0.5

## 2013-12-14 MED ORDER — ASPIRIN EC 81 MG PO TBEC
81.0000 mg | DELAYED_RELEASE_TABLET | Freq: Every day | ORAL | Status: DC
  Filled 2013-12-14: qty 1

## 2013-12-14 MED ORDER — ADULT MULTIVITAMIN W/MINERALS CH
1.0000 | ORAL_TABLET | Freq: Every day | ORAL | Status: DC
  Administered 2013-12-16 – 2013-12-17 (×2): 1 via ORAL
  Filled 2013-12-14 (×3): qty 1

## 2013-12-14 NOTE — ED Provider Notes (Signed)
CSN: [id number]     Arrival date & time 12/14/13  1450 History   First MD Initiated Contact with Patient 12/14/13 1500     Chief Complaint  Patient presents with  . Hip Injury     (Consider location/radiation/quality/duration/timing/severity/associated sxs/prior Treatment) HPI Comments: 80 yo female with a history of dementia presenting after an unwitnessed fall yesterday.  Pt is unable to provide any history secondary to dementia.  Level V Caveat applies.    Patient is a 80 y.o. female presenting with fall.  Fall This is a new problem. The current episode started yesterday. Episode frequency: once.    Past Medical History  Diagnosis Date  . Diverticulosis of colon (without mention of hemorrhage) 06/24/2006  . Carcinoma of cecum 1992    History of  . Vitamin B12 deficiency   . Alzheimer's disease   . HTN (hypertension)   . Depression   . PVD (peripheral vascular disease)   . CAD (coronary artery disease)   . GERD (gastroesophageal reflux disease)   . H. pylori infection 2004  . Presbyesophagus   . Iron deficiency anemia, unspecified 01/02/2012  . Personal history of fall 01/04/2009  . Unspecified constipation   . Hemorrhage of rectum and anus   . Chest pain, unspecified   . Acute pharyngitis   . Malignant neoplasm of colon, unspecified site   . Edema   . Cataract   . HLD (hyperlipidemia)   . Osteoporosis, unspecified    Past Surgical History  Procedure Laterality Date  . Gallbladder surgery    . Abdominal hysterectomy  1995    with bso  . Right colectomy    . Cataract extraction  2001  . Cholecystectomy Right    Family History  Problem Relation Age of Onset  . Heart disease Mother   . Colon cancer Neg Hx    History  Substance Use Topics  . Smoking status: Former Smoker    Types: Cigarettes  . Smokeless tobacco: Never Used  . Alcohol Use: No   OB History    No data available     Review of Systems  Unable to perform ROS: Dementia      Allergies   Captopril-hydrochlorothiazide; Niacin and related; Urispas; and Verapamil  Home Medications   Prior to Admission medications   Medication Sig Start Date End Date Taking? Authorizing Provider  acetaminophen (TYLENOL) 500 MG tablet Take 1,000 mg by mouth every 8 (eight) hours as needed for mild pain.   Yes Historical Provider, MD  alendronate (FOSAMAX) 70 MG tablet Take 70 mg by mouth every 7 (seven) days. Takes on Mondays. Take with a full glass of water on an empty stomach.   Yes Historical Provider, MD  amLODipine (NORVASC) 10 MG tablet Take 10 mg by mouth daily.   Yes Historical Provider, MD  aspirin EC 81 MG tablet Take 81 mg by mouth daily.   Yes Historical Provider, MD  atorvastatin (LIPITOR) 10 MG tablet Take 5 mg by mouth at bedtime. Half a tab.   Yes Historical Provider, MD  Calcium Carbonate-Vitamin D (CALCIUM-VITAMIN D) 500-200 MG-UNIT per tablet Take 1 tablet by mouth daily.   Yes Historical Provider, MD  Cholecalciferol (VITAMIN D) 2000 UNITS tablet Take 2,000 Units by mouth daily.   Yes Historical Provider, MD  divalproex (DEPAKOTE SPRINKLE) 125 MG capsule Take 125 mg by mouth 2 (two) times daily.    Yes Historical Provider, MD  donepezil (ARICEPT) 10 MG tablet Take 10 mg by mouth daily.  Yes Historical Provider, MD  DULoxetine (CYMBALTA) 20 MG capsule Take 20 mg by mouth daily. At 2pm   Yes Historical Provider, MD  DULoxetine (CYMBALTA) 60 MG capsule Take 60 mg by mouth daily. Morning   Yes Historical Provider, MD  ertapenem 1 g in sodium chloride 0.9 % 50 mL Inject 1 g into the vein daily. For 10 days. Started 12-11-13 to 12-21-13   Yes Historical Provider, MD  ferrous sulfate 325 (65 FE) MG tablet Take 325 mg by mouth daily with breakfast.   Yes Historical Provider, MD  ipratropium-albuterol (DUONEB) 0.5-2.5 (3) MG/3ML SOLN Take 3 mLs by nebulization every 6 (six) hours as needed (shortness of breath.).   Yes Historical Provider, MD  lisinopril (PRINIVIL,ZESTRIL) 10 MG tablet  Take 10 mg by mouth daily.   Yes Historical Provider, MD  metoprolol (LOPRESSOR) 25 MG tablet Take 1 tablet (25 mg total) by mouth 2 (two) times daily. If pulse <50 or SBP <100 hold med and notify MD. 04/08/13  Yes Tiffany L Reed, DO  Multiple Vitamin (MULTIVITAMIN WITH MINERALS) TABS tablet Take 1 tablet by mouth daily.   Yes Historical Provider, MD  Nutritional Supplements (TWOCAL HN) LIQD Take 90 mLs by mouth 3 (three) times daily.    Yes Historical Provider, MD  sennosides-docusate sodium (SENOKOT-S) 8.6-50 MG tablet Take 1 tablet by mouth daily.   Yes Historical Provider, MD  sucralfate (CARAFATE) 1 GM/10ML suspension Take 1 g by mouth 4 (four) times daily -  before meals and at bedtime.   Yes Historical Provider, MD  vitamin B-12 (CYANOCOBALAMIN) 1000 MCG tablet Take 1,000 mcg by mouth every other day.   Yes Historical Provider, MD   BP 130/53 mmHg  Pulse 70  Temp(Src) 98.4 F (36.9 C) (Oral)  Resp 18  SpO2 93% Physical Exam  Constitutional: She appears well-developed and well-nourished. No distress.  HENT:  Head: Normocephalic and atraumatic.  Mouth/Throat: Oropharynx is clear and moist.  Eyes: Conjunctivae are normal. Pupils are equal, round, and reactive to light. No scleral icterus.  Neck: Neck supple.  Cardiovascular: Normal rate, regular rhythm, normal heart sounds and intact distal pulses.  Frequent extrasystoles are present.  No murmur heard. Pulmonary/Chest: Effort normal and breath sounds normal. No stridor. No respiratory distress. She has no rales.  Abdominal: Soft. Bowel sounds are normal. She exhibits no distension. There is no tenderness.  Musculoskeletal:       Right hip: She exhibits decreased range of motion and deformity.  1+ distal pulses in BLE  Neurological: She is alert. She is disoriented.  Skin: Skin is warm and dry. No rash noted.  Psychiatric: She has a normal mood and affect. Her behavior is normal.  Nursing note and vitals reviewed.   ED Course   Procedures (including critical care time) Labs Review All labs drawn in ED reviewed.   Imaging Review Dg Hip Complete Right  12/14/2013   CLINICAL DATA:  Fall.  Right hip pain.  EXAM: RIGHT HIP - COMPLETE 2+ VIEW  COMPARISON:  None.  FINDINGS: The bones are diffusely osteopenic. There is an acute, impacted right femoral neck fracture. On the cross-table lateral view, the distal fracture fragment is impacted and anteriorly displaced approximately 2 cm. The right femoral head remains located in the right acetabulum.  Proximal left femur appears intact. There is slight joint space narrowing of the hips bilaterally. Pubic bones appear intact. Sacroiliac joints and pubic symphysis are unremarkable. Surgical suture and clips are noted in the right lower quadrant.  Fairly prominent amount of stool in the rectum. Visualized bowel gas pattern nonobstructive.  In the frontal projection, the height of the L3 vertebral body appears decreased, for which a compression fracture, age-indeterminate cannot be excluded. No prior images are available of the lumbar spine.  IMPRESSION: 1. Acute, impacted right femoral neck fracture. The distal fracture fragment is anteriorly displaced approximately 2 cm on the cross-table lateral view. 2. Decreased bony mineralization. 3. Cannot exclude an L3 compression fracture. No prior images of the lumbar spine available. 4. Prominent amount of stool in the rectum.   Electronically Signed   By: Curlene Dolphin M.D.   On: 12/14/2013 15:38  All radiology studies independently viewed by me.      EKG Interpretation   Date/Time:  Monday December 14 2013 16:12:23 EST Ventricular Rate:  77 PR Interval:  170 QRS Duration: 81 QT Interval:  467 QTC Calculation: 529 R Axis:   12 Text Interpretation:  Sinus rhythm Atrial premature complex Nonspecific T  abnrm, anterolateral leads Prolonged QT interval No significant change was  found Confirmed by [facility]  MD, TREY ([id number]) on 12/14/2013  5:01:49 PM      MDM   Final diagnoses:  Fall  Right femoral neck fracture, closed  80 yo female with fall yesterday.  Imaging shows right hip fracture.  Discussed case with Dr. Sharol Given (orthopedics) who recommended admission to [facility]. Discussed case with Dr. Ernestina Patches, who will admit. I also discussed patient's care with her son who lives [place] of state.  Artis Delay, MD 12/15/13 2242

## 2013-12-14 NOTE — H&P (Signed)
Hospitalist Admission History and Physical  Patient name: [name] record number: [id number] Date of birth: 05-Jun-1922 Age: 80 y.o. Gender: female  Primary Care Provider: Hollace Kinnier, DO  Chief Complaint: fall, R hip fracture  History of Present Illness:This is a 80 y.o. year old female with significant past medical history of dementia, osteroporosis, CAD, HTN  presenting with fall, R hip fracture. Level V caveat as pt is demented. Per report, pt had unwitnessed fall at SNF. No reported LOC per staff. Had xray done at nursing home that showed R hip fracture. Pt noted to be on ertapenem for ESBL UTI 11/20-11/30. No reported fevers. Per EDP, case was discussed with pt's son [name], who is "open to suggestions" for care. Case discussed with Sharol Given per EDP, who feels pt needs surgical correction as well as admission to [facility]. Dr. Sharol Given is in process of discussing case with son.  On presentation hemodynamically stable, afebrile. WBC 12.4, hgb 11.7, Cr 0.84. CXR WNL apart from R basilar atelectasis. R hip xray shows Acute, impacted right femoral neck fracture. The distal fracture fragment is anteriorly displaced approximately 2 cm on the cross-table lateral view. Cannot exclude an L3 compression fracture.   [name]: [phone]  Assessment and Plan: [name] is a 80 y.o. year old female presenting with unwitnessed fall, R hip fracture, ESBL UTI   Active Problems:   Fall   Hip fracture, right   1- Fall/hip fracture  -unwitnessed fall in setting of severe dementia  - R hip fracture per ortho recs -no active pain  -f/u ortho consult and overall plan   2- ESBL UTI - ESBL UTI by culture in review of MAR ( no formal documentation)  -cont ertapenenem -repeat urine cx   3- Dementia -end stage  -cont home regimen   4- CAD/HTN -stable  -cont home regimen   5- ? seixure d/o  -on depakote -cont  -check level   FEN/GI: NPO PMN  Prophylaxis: sub q heparin   Disposition: pending further evaluation  Code Status:DNR (pending formal documentation from nursing home per nursing note)   Patient Active Problem List   Diagnosis Date Noted  . Fall 12/14/2013  . Hip fracture, right 12/14/2013  . Senile osteoporosis 02/06/2013  . Osteoporosis, unspecified   . Bradycardia 09/18/2012  . Altered mental status 09/17/2012  . Hypotension 09/17/2012  . HLD (hyperlipidemia)   . Generalized weakness 09/09/2012  . Recurrent falls 09/09/2012  . Anemia 09/09/2012  . Anemia, iron deficiency 04/16/2012  . Sepsis 04/06/2012  . Acute respiratory distress 04/06/2012  . Fever 04/06/2012  . Dehydration 04/06/2012  . Sinus tachycardia 04/06/2012  . Depression 04/06/2012  . CAD (coronary artery disease) 04/06/2012  . HCAP (healthcare-associated pneumonia) 04/06/2012  . Encephalopathy acute 12/28/2011  . Dementia of the Alzheimer's type, with late onset, with delusions 12/28/2011  . Dizziness 12/24/2011  . HTN (hypertension) 12/24/2011  . Dysphagia 08/07/2011  . GERD (gastroesophageal reflux disease) 08/07/2011   Past Medical History: Past Medical History  Diagnosis Date  . Diverticulosis of colon (without mention of hemorrhage) 06/24/2006  . Carcinoma of cecum 1992    History of  . Vitamin B12 deficiency   . Alzheimer's disease   . HTN (hypertension)   . Depression   . PVD (peripheral vascular disease)   . CAD (coronary artery disease)   . GERD (gastroesophageal reflux disease)   . H. pylori infection 2004  . Presbyesophagus   . Iron deficiency anemia, unspecified 01/02/2012  .  Personal history of fall 01/04/2009  . Unspecified constipation   . Hemorrhage of rectum and anus   . Chest pain, unspecified   . Acute pharyngitis   . Malignant neoplasm of colon, unspecified site   . Edema   . Cataract   . HLD (hyperlipidemia)   . Osteoporosis, unspecified     Past Surgical History: Past Surgical History  Procedure Laterality Date  .  Gallbladder surgery    . Abdominal hysterectomy  1995    with bso  . Right colectomy    . Cataract extraction  2001  . Cholecystectomy Right     Social History: History   Social History  . Marital Status: Widowed    Spouse Name: N/A    Number of Children: 2  . Years of Education: N/A   Occupational History  . Retired    Social History Main Topics  . Smoking status: Former Smoker    Types: Cigarettes  . Smokeless tobacco: Never Used  . Alcohol Use: No  . Drug Use: No  . Sexual Activity: No   Other Topics Concern  . None   Social History Narrative    Family History: Family History  Problem Relation Age of Onset  . Heart disease Mother   . Colon cancer Neg Hx     Allergies: Allergies  Allergen Reactions  . Captopril-Hydrochlorothiazide Other (See Comments)    Reaction unknown  . Niacin And Related Other (See Comments)    Reaction unknown  . Urispas [Flavoxate] Other (See Comments)    Reaction unknown  . Verapamil Other (See Comments)    Reaction unknown    Current Facility-Administered Medications  Medication Dose Route Frequency Provider Last Rate Last Dose  . 0.9 %  sodium chloride infusion   Intravenous Continuous Shanda Howells, MD      . acetaminophen (TYLENOL) tablet 1,000 mg  1,000 mg Oral Q8H PRN Shanda Howells, MD      . alendronate (FOSAMAX) tablet 70 mg  70 mg Oral Q7 days Shanda Howells, MD      . amLODipine (NORVASC) tablet 10 mg  10 mg Oral Daily Shanda Howells, MD      . aspirin EC tablet 81 mg  81 mg Oral Daily Shanda Howells, MD      . atorvastatin (LIPITOR) tablet 5 mg  5 mg Oral QHS Shanda Howells, MD      . calcium-vitamin D 500-200 MG-UNIT per tablet 1 tablet  1 tablet Oral Daily Shanda Howells, MD      . divalproex (DEPAKOTE SPRINKLE) capsule 125 mg  125 mg Oral BID Shanda Howells, MD      . donepezil (ARICEPT) tablet 10 mg  10 mg Oral Daily Shanda Howells, MD      . DULoxetine (CYMBALTA) DR capsule 20 mg  20 mg Oral Daily Shanda Howells, MD       . Derrill Memo ON 12/15/2013] ferrous sulfate tablet 325 mg  325 mg Oral Q breakfast Shanda Howells, MD      . heparin injection 5,000 Units  5,000 Units Subcutaneous 3 times per day Shanda Howells, MD      . ipratropium-albuterol (DUONEB) 0.5-2.5 (3) MG/3ML nebulizer solution 3 mL  3 mL Nebulization Q6H PRN Shanda Howells, MD      . metoprolol tartrate (LOPRESSOR) tablet 25 mg  25 mg Oral BID Shanda Howells, MD      . morphine 2 MG/ML injection 1-2 mg  1-2 mg Intravenous Q4H PRN Shanda Howells, MD      .  multivitamin with minerals tablet 1 tablet  1 tablet Oral Daily Shanda Howells, MD      . sennosides-docusate sodium (SENOKOT-S) 8.6-50 MG tablet 1 tablet  1 tablet Oral Daily Shanda Howells, MD      . sodium chloride 0.9 % injection 3 mL  3 mL Intravenous Q12H Shanda Howells, MD      . sucralfate (CARAFATE) 1 GM/10ML suspension 1 g  1 g Oral TID AC & HS Shanda Howells, MD      . TWOCAL HN liquid 90 mL  90 mL Oral TID Shanda Howells, MD      . Vitamin D 2,000 Units  2,000 Units Oral Daily Shanda Howells, MD       Current Outpatient Prescriptions  Medication Sig Dispense Refill  . acetaminophen (TYLENOL) 500 MG tablet Take 1,000 mg by mouth every 8 (eight) hours as needed for mild pain.    Marland Kitchen alendronate (FOSAMAX) 70 MG tablet Take 70 mg by mouth every 7 (seven) days. Takes on Mondays. Take with a full glass of water on an empty stomach.    Marland Kitchen amLODipine (NORVASC) 10 MG tablet Take 10 mg by mouth daily.    Marland Kitchen aspirin EC 81 MG tablet Take 81 mg by mouth daily.    Marland Kitchen atorvastatin (LIPITOR) 10 MG tablet Take 5 mg by mouth at bedtime. Half a tab.    . Calcium Carbonate-Vitamin D (CALCIUM-VITAMIN D) 500-200 MG-UNIT per tablet Take 1 tablet by mouth daily.    . Cholecalciferol (VITAMIN D) 2000 UNITS tablet Take 2,000 Units by mouth daily.    . divalproex (DEPAKOTE SPRINKLE) 125 MG capsule Take 125 mg by mouth 2 (two) times daily.     Marland Kitchen donepezil (ARICEPT) 10 MG tablet Take 10 mg by mouth daily.     . DULoxetine  (CYMBALTA) 20 MG capsule Take 20 mg by mouth daily. At 2pm    . DULoxetine (CYMBALTA) 60 MG capsule Take 60 mg by mouth daily. Morning    . ertapenem 1 g in sodium chloride 0.9 % 50 mL Inject 1 g into the vein daily. For 10 days. Started 12-11-13 to 12-21-13    . ferrous sulfate 325 (65 FE) MG tablet Take 325 mg by mouth daily with breakfast.    . ipratropium-albuterol (DUONEB) 0.5-2.5 (3) MG/3ML SOLN Take 3 mLs by nebulization every 6 (six) hours as needed (shortness of breath.).    Marland Kitchen lisinopril (PRINIVIL,ZESTRIL) 10 MG tablet Take 10 mg by mouth daily.    . metoprolol (LOPRESSOR) 25 MG tablet Take 1 tablet (25 mg total) by mouth 2 (two) times daily. If pulse <50 or SBP <100 hold med and notify MD. 60 tablet 0  . Multiple Vitamin (MULTIVITAMIN WITH MINERALS) TABS tablet Take 1 tablet by mouth daily.    . Nutritional Supplements (TWOCAL HN) LIQD Take 90 mLs by mouth 3 (three) times daily.     . sennosides-docusate sodium (SENOKOT-S) 8.6-50 MG tablet Take 1 tablet by mouth daily.    . sucralfate (CARAFATE) 1 GM/10ML suspension Take 1 g by mouth 4 (four) times daily -  before meals and at bedtime.    . vitamin B-12 (CYANOCOBALAMIN) 1000 MCG tablet Take 1,000 mcg by mouth every other day.     Review Of Systems: 12 point ROS negative except as noted above in HPI.  Physical Exam: Filed Vitals:   12/14/13 1731  BP: 156/69  Pulse: 77  Temp: 98.2 F (36.8 C)  Resp: 23    General: confused, minimally  cooperative to exam  HEENT: PERRLA and extra ocular movement intact Heart: S1, S2 normal, no murmur, rub or gallop, regular rate and rhythm Lungs: clear to auscultation, no wheezes or rales and unlabored breathing Abdomen: abdomen is soft without significant tenderness, masses, organomegaly or guarding Extremities: extremities normal, atraumatic, no cyanosis or edema Skin:no rashes Neurology: confused, minimally cooperative to exam   Labs and Imaging: Lab Results  Component Value Date/Time    NA 142 12/14/2013 03:56 PM   K 4.1 12/14/2013 03:56 PM   CL 106 12/14/2013 03:56 PM   CO2 23 12/14/2013 03:56 PM   BUN 32* 12/14/2013 03:56 PM   CREATININE 0.84 12/14/2013 03:56 PM   GLUCOSE 111* 12/14/2013 03:56 PM   Lab Results  Component Value Date   WBC 12.4* 12/14/2013   HGB 11.7* 12/14/2013   HCT 35.6* 12/14/2013   MCV 89.9 12/14/2013   PLT 239 12/14/2013    Dg Chest 2 View  12/14/2013   CLINICAL DATA:  Weakness  EXAM: CHEST  2 VIEW  COMPARISON:  09/17/2012  FINDINGS: Cardiac shadow is stable. Right basilar atelectasis is noted. No focal confluent infiltrate or sizable effusion is seen. Compression deformity of T11 is noted and stable. No acute bony abnormality is seen.  IMPRESSION: Right basilar atelectasis.   Electronically Signed   By: Inez Catalina M.D.   On: 12/14/2013 18:25   Dg Hip Complete Right  12/14/2013   CLINICAL DATA:  Fall.  Right hip pain.  EXAM: RIGHT HIP - COMPLETE 2+ VIEW  COMPARISON:  None.  FINDINGS: The bones are diffusely osteopenic. There is an acute, impacted right femoral neck fracture. On the cross-table lateral view, the distal fracture fragment is impacted and anteriorly displaced approximately 2 cm. The right femoral head remains located in the right acetabulum.  Proximal left femur appears intact. There is slight joint space narrowing of the hips bilaterally. Pubic bones appear intact. Sacroiliac joints and pubic symphysis are unremarkable. Surgical suture and clips are noted in the right lower quadrant. Fairly prominent amount of stool in the rectum. Visualized bowel gas pattern nonobstructive.  In the frontal projection, the height of the L3 vertebral body appears decreased, for which a compression fracture, age-indeterminate cannot be excluded. No prior images are available of the lumbar spine.  IMPRESSION: 1. Acute, impacted right femoral neck fracture. The distal fracture fragment is anteriorly displaced approximately 2 cm on the cross-table lateral  view. 2. Decreased bony mineralization. 3. Cannot exclude an L3 compression fracture. No prior images of the lumbar spine available. 4. Prominent amount of stool in the rectum.   Electronically Signed   By: Curlene Dolphin M.D.   On: 12/14/2013 15:38           Shanda Howells MD  Pager: [phone]

## 2013-12-14 NOTE — ED Notes (Signed)
Pt from [facility]. Per ems pt fell yesterday at the facility, no witness. Per staff pt had no loc. Pt has xray done yesterday and resulted today showing positive fracture right hip. Pt alert to her norm. Hx of dementia. Pt is normally ambulatory. IV inserted prior to arrival, per ems iv was inserted on Thursday 12/10/13. Pt received antibiotic IV at facility. No anticoagulant intake.

## 2013-12-14 NOTE — ED Notes (Signed)
Bed: WA02 Expected date:  Expected time:  Means of arrival:  Comments: Ems- elderly, hip fx

## 2013-12-14 NOTE — ED Notes (Signed)
Spoke to [employer], Therapist, sports at [facility] requesting DNR original copy. Also attempted to call son in order to get consent for surgery, left a voice mail and call back number.

## 2013-12-14 NOTE — ED Notes (Signed)
Soft hands mitten put on pt to protect pt from removing her tubes.

## 2013-12-14 NOTE — ED Notes (Signed)
Patient transported to X-ray 

## 2013-12-14 NOTE — ED Notes (Signed)
ICU contacted, asked to send mitts to ED to prevent pt from removing oxygen.

## 2013-12-15 ENCOUNTER — Encounter (HOSPITAL_COMMUNITY): Payer: Self-pay | Admitting: *Deleted

## 2013-12-15 ENCOUNTER — Inpatient Hospital Stay (HOSPITAL_COMMUNITY): Payer: Medicare Other | Admitting: Anesthesiology

## 2013-12-15 ENCOUNTER — Encounter (HOSPITAL_COMMUNITY)
Admission: EM | Disposition: A | Discharge status: Skilled Nursing Facility | Payer: Self-pay | Source: Home / Self Care | Attending: Internal Medicine

## 2013-12-15 HISTORY — PX: HIP ARTHROPLASTY: SHX981

## 2013-12-15 LAB — MAGNESIUM: MAGNESIUM: 2.1 mg/dL (ref 1.5–2.5)

## 2013-12-15 LAB — SURGICAL PCR SCREEN
MRSA, PCR: NEGATIVE
Staphylococcus aureus: NEGATIVE

## 2013-12-15 SURGERY — HEMIARTHROPLASTY, HIP, DIRECT ANTERIOR APPROACH, FOR FRACTURE
Anesthesia: General | Laterality: Right

## 2013-12-15 MED ORDER — PHENOL 1.4 % MT LIQD: 1.0000 | OROMUCOSAL | Status: DC | PRN

## 2013-12-15 MED ORDER — CEFAZOLIN SODIUM-DEXTROSE 2-3 GM-% IV SOLR: 2.0000 g | Freq: Four times a day (QID) | INTRAVENOUS | Status: DC

## 2013-12-15 MED ORDER — MORPHINE SULFATE 2 MG/ML IJ SOLN: 0.5000 mg | INTRAMUSCULAR | Status: DC | PRN

## 2013-12-15 MED ORDER — SENNOSIDES-DOCUSATE SODIUM 8.6-50 MG PO TABS
2.0000 | ORAL_TABLET | Freq: Two times a day (BID) | ORAL | Status: DC
  Administered 2013-12-15 – 2013-12-16 (×2): 2 via ORAL
  Filled 2013-12-15 (×2): qty 2

## 2013-12-15 MED ORDER — ASPIRIN EC 325 MG PO TBEC
325.0000 mg | DELAYED_RELEASE_TABLET | Freq: Every day | ORAL | Status: DC
  Administered 2013-12-16 – 2013-12-17 (×2): 325 mg via ORAL
  Filled 2013-12-15 (×3): qty 1

## 2013-12-15 MED ORDER — HYDROCODONE-ACETAMINOPHEN 5-325 MG PO TABS: 1.0000 | ORAL_TABLET | Freq: Four times a day (QID) | ORAL | Status: DC | PRN

## 2013-12-15 MED ORDER — CEFAZOLIN SODIUM-DEXTROSE 2-3 GM-% IV SOLR
INTRAVENOUS | Status: DC | PRN
  Administered 2013-12-15: 2 g via INTRAVENOUS

## 2013-12-15 MED ORDER — LACTATED RINGERS IV SOLN
INTRAVENOUS | Status: DC | PRN
  Administered 2013-12-15: 17:00:00 via INTRAVENOUS

## 2013-12-15 MED ORDER — SUCCINYLCHOLINE CHLORIDE 20 MG/ML IJ SOLN
INTRAMUSCULAR | Status: DC | PRN
  Administered 2013-12-15: 120 mg via INTRAVENOUS

## 2013-12-15 MED ORDER — ROCURONIUM BROMIDE 50 MG/5ML IV SOLN
INTRAVENOUS | Status: AC
  Filled 2013-12-15: qty 1

## 2013-12-15 MED ORDER — MENTHOL 3 MG MT LOZG: 1.0000 | LOZENGE | OROMUCOSAL | Status: DC | PRN

## 2013-12-15 MED ORDER — LIDOCAINE HCL (CARDIAC) 20 MG/ML IV SOLN
INTRAVENOUS | Status: AC
  Filled 2013-12-15: qty 5

## 2013-12-15 MED ORDER — PHENYLEPHRINE HCL 10 MG/ML IJ SOLN
INTRAMUSCULAR | Status: DC | PRN
  Administered 2013-12-15 (×2): 80 ug via INTRAVENOUS
  Administered 2013-12-15: 40 ug via INTRAVENOUS

## 2013-12-15 MED ORDER — CEFAZOLIN SODIUM-DEXTROSE 2-3 GM-% IV SOLR
2.0000 g | INTRAVENOUS | Status: AC
  Administered 2013-12-15: 2 g via INTRAVENOUS
  Filled 2013-12-15: qty 50

## 2013-12-15 MED ORDER — FENTANYL CITRATE 0.05 MG/ML IJ SOLN
INTRAMUSCULAR | Status: AC
  Filled 2013-12-15: qty 5

## 2013-12-15 MED ORDER — ACETAMINOPHEN 650 MG RE SUPP: 650.0000 mg | Freq: Four times a day (QID) | RECTAL | Status: DC | PRN

## 2013-12-15 MED ORDER — HYDROMORPHONE HCL 1 MG/ML IJ SOLN: 0.2500 mg | INTRAMUSCULAR | Status: DC | PRN

## 2013-12-15 MED ORDER — ONDANSETRON HCL 4 MG/2ML IJ SOLN: 4.0000 mg | Freq: Once | INTRAMUSCULAR | Status: DC | PRN

## 2013-12-15 MED ORDER — PHENYLEPHRINE 40 MCG/ML (10ML) SYRINGE FOR IV PUSH (FOR BLOOD PRESSURE SUPPORT)
PREFILLED_SYRINGE | INTRAVENOUS | Status: AC
  Filled 2013-12-15: qty 10

## 2013-12-15 MED ORDER — PROPOFOL 10 MG/ML IV BOLUS
INTRAVENOUS | Status: DC | PRN
  Administered 2013-12-15: 80 mg via INTRAVENOUS

## 2013-12-15 MED ORDER — LIDOCAINE HCL (CARDIAC) 20 MG/ML IV SOLN
INTRAVENOUS | Status: DC | PRN
  Administered 2013-12-15: 60 mg via INTRAVENOUS

## 2013-12-15 MED ORDER — SODIUM CHLORIDE 0.9 % IV SOLN
INTRAVENOUS | Status: DC
  Administered 2013-12-15: 21:00:00 via INTRAVENOUS

## 2013-12-15 MED ORDER — SODIUM CHLORIDE 0.9 % IR SOLN
Status: DC | PRN
  Administered 2013-12-15: 1000 mL

## 2013-12-15 MED ORDER — METOCLOPRAMIDE HCL 5 MG/ML IJ SOLN
5.0000 mg | Freq: Three times a day (TID) | INTRAMUSCULAR | Status: DC | PRN
Start: 2013-12-15 — End: 2013-12-17

## 2013-12-15 MED ORDER — PHENYLEPHRINE HCL 10 MG/ML IJ SOLN
10.0000 mg | INTRAMUSCULAR | Status: DC | PRN
  Administered 2013-12-15: 50 ug/min via INTRAVENOUS

## 2013-12-15 MED ORDER — CHLORHEXIDINE GLUCONATE 4 % EX LIQD
60.0000 mL | Freq: Once | CUTANEOUS | Status: AC
  Administered 2013-12-15: 4 via TOPICAL
  Filled 2013-12-15: qty 60

## 2013-12-15 MED ORDER — METOCLOPRAMIDE HCL 10 MG PO TABS
5.0000 mg | ORAL_TABLET | Freq: Three times a day (TID) | ORAL | Status: DC | PRN
Start: 2013-12-15 — End: 2013-12-17

## 2013-12-15 MED ORDER — ONDANSETRON HCL 4 MG PO TABS: 4.0000 mg | ORAL_TABLET | Freq: Four times a day (QID) | ORAL | Status: DC | PRN

## 2013-12-15 MED ORDER — ACETAMINOPHEN 500 MG PO TABS: 500.0000 mg | ORAL_TABLET | Freq: Four times a day (QID) | ORAL | Status: DC | PRN

## 2013-12-15 MED ORDER — OXYCODONE HCL 5 MG/5ML PO SOLN: 5.0000 mg | Freq: Once | ORAL | Status: DC | PRN

## 2013-12-15 MED ORDER — ARTIFICIAL TEARS OP OINT
TOPICAL_OINTMENT | OPHTHALMIC | Status: DC | PRN
  Administered 2013-12-15: 1 via OPHTHALMIC

## 2013-12-15 MED ORDER — ASPIRIN EC 325 MG PO TBEC: 325.0000 mg | DELAYED_RELEASE_TABLET | Freq: Every day | ORAL | Status: AC

## 2013-12-15 MED ORDER — ONDANSETRON HCL 4 MG/2ML IJ SOLN: 4.0000 mg | Freq: Four times a day (QID) | INTRAMUSCULAR | Status: DC | PRN

## 2013-12-15 MED ORDER — OXYCODONE HCL 5 MG PO TABS: 5.0000 mg | ORAL_TABLET | Freq: Once | ORAL | Status: DC | PRN

## 2013-12-15 MED ORDER — FENTANYL CITRATE 0.05 MG/ML IJ SOLN
INTRAMUSCULAR | Status: DC | PRN
  Administered 2013-12-15: 50 ug via INTRAVENOUS
  Administered 2013-12-15: 100 ug via INTRAVENOUS
  Administered 2013-12-15: 50 ug via INTRAVENOUS

## 2013-12-15 MED ORDER — MEPERIDINE HCL 25 MG/ML IJ SOLN: 6.2500 mg | INTRAMUSCULAR | Status: DC | PRN

## 2013-12-15 MED ORDER — ACETAMINOPHEN 325 MG PO TABS
650.0000 mg | ORAL_TABLET | Freq: Four times a day (QID) | ORAL | Status: DC | PRN
  Administered 2013-12-16: 650 mg via ORAL
  Filled 2013-12-15 (×2): qty 2

## 2013-12-15 SURGICAL SUPPLY — 41 items
BLADE SAW SAG 73X25 THK (BLADE) ×2
BLADE SAW SGTL 73X25 THK (BLADE) ×1 IMPLANT
BRUSH FEMORAL CANAL (MISCELLANEOUS) IMPLANT
CAP PRESSFIT BIPLR HP CP STDHD ×2 IMPLANT
DRAPE IMP U-DRAPE 54X76 (DRAPES) ×3 IMPLANT
DRAPE INCISE IOBAN 85X60 (DRAPES) ×6 IMPLANT
DRAPE ORTHO SPLIT 77X108 STRL (DRAPES) ×6
DRAPE SURG ORHT 6 SPLT 77X108 (DRAPES) ×2 IMPLANT
DRAPE U-SHAPE 47X51 STRL (DRAPES) ×3 IMPLANT
DRSG MEPILEX BORDER 4X4 (GAUZE/BANDAGES/DRESSINGS) ×2 IMPLANT
DRSG MEPILEX BORDER 4X8 (GAUZE/BANDAGES/DRESSINGS) ×4 IMPLANT
DURAPREP 26ML APPLICATOR (WOUND CARE) ×3 IMPLANT
ELECT BLADE 6.5 EXT (BLADE) IMPLANT
ELECT CAUTERY BLADE 6.4 (BLADE) IMPLANT
ELECT REM PT RETURN 9FT ADLT (ELECTROSURGICAL) ×3
ELECTRODE REM PT RTRN 9FT ADLT (ELECTROSURGICAL) ×1 IMPLANT
GAUZE SPONGE 4X4 12PLY STRL (GAUZE/BANDAGES/DRESSINGS) ×3 IMPLANT
GLOVE BIOGEL PI IND STRL 9 (GLOVE) ×1 IMPLANT
GLOVE BIOGEL PI INDICATOR 9 (GLOVE) ×2
GLOVE SURG ORTHO 9.0 STRL STRW (GLOVE) ×3 IMPLANT
GOWN STRL REUS W/ TWL XL LVL3 (GOWN DISPOSABLE) ×1 IMPLANT
GOWN STRL REUS W/TWL XL LVL3 (GOWN DISPOSABLE) ×3
HANDPIECE INTERPULSE COAX TIP (DISPOSABLE)
KIT BASIN OR (CUSTOM PROCEDURE TRAY) ×3 IMPLANT
KIT ROOM TURNOVER OR (KITS) ×3 IMPLANT
MANIFOLD NEPTUNE II (INSTRUMENTS) ×3 IMPLANT
NS IRRIG 1000ML POUR BTL (IV SOLUTION) ×3 IMPLANT
PACK TOTAL JOINT (CUSTOM PROCEDURE TRAY) ×3 IMPLANT
PACK UNIVERSAL I (CUSTOM PROCEDURE TRAY) ×1 IMPLANT
PAD ARMBOARD 7.5X6 YLW CONV (MISCELLANEOUS) ×6 IMPLANT
SET HNDPC FAN SPRY TIP SCT (DISPOSABLE) IMPLANT
STAPLER VISISTAT 35W (STAPLE) ×3 IMPLANT
SUT ETHIBOND NAB CT1 #1 30IN (SUTURE) ×3 IMPLANT
SUT VIC AB 1 CT1 27 (SUTURE) ×3
SUT VIC AB 1 CT1 27XBRD ANBCTR (SUTURE) ×1 IMPLANT
SUT VIC AB 2-0 CTB1 (SUTURE) ×3 IMPLANT
TOWEL OR 17X24 6PK STRL BLUE (TOWEL DISPOSABLE) ×3 IMPLANT
TOWEL OR 17X26 10 PK STRL BLUE (TOWEL DISPOSABLE) ×3 IMPLANT
TOWER CARTRIDGE SMART MIX (DISPOSABLE) IMPLANT
TRAY FOLEY CATH 16FRSI W/METER (SET/KITS/TRAYS/PACK) IMPLANT
WATER STERILE IRR 1000ML POUR (IV SOLUTION) ×9 IMPLANT

## 2013-12-15 NOTE — Progress Notes (Signed)
Bed placement notified the RN that this patient is suppose to be a [facility]. PCP was notified.

## 2013-12-15 NOTE — Consult Note (Signed)
Reason for Consult: Right femoral neck fracture Referring Physician: ER physician   [name] is an 80 y.o. female.  HPI:  Patient is a 80 year old woman with Alzheimer's dementia who sustained a mechanical fall sustaining a right femoral neck fracture   Past Medical History  Diagnosis Date  . Diverticulosis of colon (without mention of hemorrhage) 06/24/2006  . Vitamin B12 deficiency   . Alzheimer's disease   . HTN (hypertension)   . Depression   . PVD (peripheral vascular disease)   . CAD (coronary artery disease)   . GERD (gastroesophageal reflux disease)   . H. pylori infection 2004  . Presbyesophagus   . Iron deficiency anemia, unspecified 01/02/2012  . Personal history of fall 01/04/2009  . Unspecified constipation   . Hemorrhage of rectum and anus   . Chest pain, unspecified   . Acute pharyngitis   . Edema   . Cataract   . HLD (hyperlipidemia)   . Osteoporosis, unspecified   . Carcinoma of cecum 1992    History of  . Malignant neoplasm of colon, unspecified site     Past Surgical History  Procedure Laterality Date  . Gallbladder surgery    . Abdominal hysterectomy  1995    with bso  . Right colectomy    . Cataract extraction  2001  . Cholecystectomy Right     Family History  Problem Relation Age of Onset  . Heart disease Mother   . Colon cancer Neg Hx     Social History:  reports that she has quit smoking. Her smoking use included Cigarettes. She smoked 0.00 packs per day. She has never used smokeless tobacco. She reports that she does not drink alcohol or use illicit drugs.  Allergies:  Allergies  Allergen Reactions  . Captopril-Hydrochlorothiazide Other (See Comments)    Reaction unknown  . Niacin And Related Other (See Comments)    Reaction unknown  . Urispas [Flavoxate] Other (See Comments)    Reaction unknown  . Verapamil Other (See Comments)    Reaction unknown    Medications: I have reviewed the patient's current  medications.  Results for orders placed or performed during the hospital encounter of 12/14/13 (from the past 48 hour(s))  Basic metabolic panel     Status: Abnormal   Collection Time: 12/14/13  3:56 PM  Result Value Ref Range   Sodium 142 137 - 147 mEq/L   Potassium 4.1 3.7 - 5.3 mEq/L   Chloride 106 96 - 112 mEq/L   CO2 23 19 - 32 mEq/L   Glucose, Bld 111 (H) 70 - 99 mg/dL   BUN 32 (H) 6 - 23 mg/dL   Creatinine, Ser 0.84 0.50 - 1.10 mg/dL   Calcium 9.7 8.4 - 10.5 mg/dL   GFR calc non Af Amer 60 (L) >90 mL/min   GFR calc Af Amer 69 (L) >90 mL/min    Comment: (NOTE) The eGFR has been calculated using the CKD EPI equation. This calculation has not been validated in all clinical situations. eGFR's persistently <90 mL/min signify possible Chronic Kidney Disease.    Anion gap 13 5 - 15  CBC WITH DIFFERENTIAL     Status: Abnormal   Collection Time: 12/14/13  3:56 PM  Result Value Ref Range   WBC 12.4 (H) 4.0 - 10.5 K/uL   RBC 3.96 3.87 - 5.11 MIL/uL   Hemoglobin 11.7 (L) 12.0 - 15.0 g/dL   HCT 35.6 (L) 36.0 - 46.0 %   MCV 89.9 78.0 -  100.0 fL   MCH 29.5 26.0 - 34.0 pg   MCHC 32.9 30.0 - 36.0 g/dL   RDW 13.3 11.5 - 15.5 %   Platelets 239 150 - 400 K/uL   Neutrophils Relative % 77 43 - 77 %   Neutro Abs 9.5 (H) 1.7 - 7.7 K/uL   Lymphocytes Relative 14 12 - 46 %   Lymphs Abs 1.7 0.7 - 4.0 K/uL   Monocytes Relative 6 3 - 12 %   Monocytes Absolute 0.8 0.1 - 1.0 K/uL   Eosinophils Relative 3 0 - 5 %   Eosinophils Absolute 0.4 0.0 - 0.7 K/uL   Basophils Relative 0 0 - 1 %   Basophils Absolute 0.0 0.0 - 0.1 K/uL  Protime-INR     Status: Abnormal   Collection Time: 12/14/13  3:56 PM  Result Value Ref Range   Prothrombin Time 15.9 (H) 11.6 - 15.2 seconds   INR 1.26 0.00 - 1.49  Type and screen     Status: None   Collection Time: 12/14/13  3:56 PM  Result Value Ref Range   ABO/RH(D) O NEG    Antibody Screen NEG    Sample Expiration 12/17/2013   ABO/Rh     Status: None    Collection Time: 12/14/13  3:56 PM  Result Value Ref Range   ABO/RH(D) O NEG   Urinalysis, Routine w reflex microscopic     Status: Abnormal   Collection Time: 12/14/13  4:45 PM  Result Value Ref Range   Color, Urine YELLOW YELLOW   APPearance CLEAR CLEAR   Specific Gravity, Urine 1.024 1.005 - 1.030   pH 5.5 5.0 - 8.0   Glucose, UA NEGATIVE NEGATIVE mg/dL   Hgb urine dipstick TRACE (A) NEGATIVE   Bilirubin Urine NEGATIVE NEGATIVE   Ketones, ur NEGATIVE NEGATIVE mg/dL   Protein, ur NEGATIVE NEGATIVE mg/dL   Urobilinogen, UA 0.2 0.0 - 1.0 mg/dL   Nitrite NEGATIVE NEGATIVE   Leukocytes, UA NEGATIVE NEGATIVE  Urine microscopic-add on     Status: None   Collection Time: 12/14/13  4:45 PM  Result Value Ref Range   Squamous Epithelial / LPF RARE RARE   WBC, UA 3-6 <3 WBC/hpf   RBC / HPF 0-2 <3 RBC/hpf  CBC     Status: Abnormal   Collection Time: 12/14/13  7:53 PM  Result Value Ref Range   WBC 11.2 (H) 4.0 - 10.5 K/uL   RBC 4.01 3.87 - 5.11 MIL/uL   Hemoglobin 11.7 (L) 12.0 - 15.0 g/dL   HCT 35.6 (L) 36.0 - 46.0 %   MCV 88.8 78.0 - 100.0 fL   MCH 29.2 26.0 - 34.0 pg   MCHC 32.9 30.0 - 36.0 g/dL   RDW 13.3 11.5 - 15.5 %   Platelets 234 150 - 400 K/uL  Creatinine, serum     Status: Abnormal   Collection Time: 12/14/13  7:53 PM  Result Value Ref Range   Creatinine, Ser 0.79 0.50 - 1.10 mg/dL   GFR calc non Af Amer 72 (L) >90 mL/min   GFR calc Af Amer 83 (L) >90 mL/min    Comment: (NOTE) The eGFR has been calculated using the CKD EPI equation. This calculation has not been validated in all clinical situations. eGFR's persistently <90 mL/min signify possible Chronic Kidney Disease.   Valproic acid level     Status: Abnormal   Collection Time: 12/14/13  7:53 PM  Result Value Ref Range   Valproic Acid Lvl <10.0 (L) 50.0 -  100.0 ug/mL    Comment: Performed at [facility]    Dg Chest 2 View  12/14/2013   CLINICAL DATA:  Weakness  EXAM: CHEST  2 VIEW  COMPARISON:   09/17/2012  FINDINGS: Cardiac shadow is stable. Right basilar atelectasis is noted. No focal confluent infiltrate or sizable effusion is seen. Compression deformity of T11 is noted and stable. No acute bony abnormality is seen.  IMPRESSION: Right basilar atelectasis.   Electronically Signed   By: Inez Catalina M.D.   On: 12/14/2013 18:25   Dg Hip Complete Right  12/14/2013   CLINICAL DATA:  Fall.  Right hip pain.  EXAM: RIGHT HIP - COMPLETE 2+ VIEW  COMPARISON:  None.  FINDINGS: The bones are diffusely osteopenic. There is an acute, impacted right femoral neck fracture. On the cross-table lateral view, the distal fracture fragment is impacted and anteriorly displaced approximately 2 cm. The right femoral head remains located in the right acetabulum.  Proximal left femur appears intact. There is slight joint space narrowing of the hips bilaterally. Pubic bones appear intact. Sacroiliac joints and pubic symphysis are unremarkable. Surgical suture and clips are noted in the right lower quadrant. Fairly prominent amount of stool in the rectum. Visualized bowel gas pattern nonobstructive.  In the frontal projection, the height of the L3 vertebral body appears decreased, for which a compression fracture, age-indeterminate cannot be excluded. No prior images are available of the lumbar spine.  IMPRESSION: 1. Acute, impacted right femoral neck fracture. The distal fracture fragment is anteriorly displaced approximately 2 cm on the cross-table lateral view. 2. Decreased bony mineralization. 3. Cannot exclude an L3 compression fracture. No prior images of the lumbar spine available. 4. Prominent amount of stool in the rectum.   Electronically Signed   By: Curlene Dolphin M.D.   On: 12/14/2013 15:38    ROS Blood pressure 115/56, pulse 67, temperature 98.2 F (36.8 C), temperature source Oral, resp. rate 15, height '5\' 1"'  (1.549 m), weight 52.8 kg (116 lb 6.5 oz), SpO2 93 %. Physical Exam  on examination patient has pain  with passive range of motion of the right hip. Radiograph shows an impacted femoral neck fracture.  AssessmentAssessment: Right femoral neck fracture.  Plan: I have discussed patient's care with her son. Patient states he would like to proceed with hemiarthroplasty for pain relief. Discussed the patient is at increased risks including infection neurovascular injury dislocation mortality. Patient's son states he understands and wishes to proceed at this time.  Raquelle Pietro V 12/15/2013, 6:12 AM

## 2013-12-15 NOTE — Progress Notes (Signed)
RN called [facility] for transportation to [place] at [facility].

## 2013-12-15 NOTE — Progress Notes (Signed)
RN gave report to the RN who will be receiving the patient on [place].

## 2013-12-15 NOTE — Transfer of Care (Signed)
Immediate Anesthesia Transfer of Care Note  Patient: [name]  Procedure(s) Performed: Procedure(s): HEMIARTHROPLASTY RIGHTHIP (Right)  Patient Location: PACU  Anesthesia Type:General  Level of Consciousness: awake, alert  and oriented  Airway & Oxygen Therapy: Patient Spontanous Breathing and Patient connected to nasal cannula oxygen  Post-op Assessment: Report given to PACU RN and Post -op Vital signs reviewed and stable  Post vital signs: Reviewed and stable  Complications: No apparent anesthesia complications

## 2013-12-15 NOTE — Care Management Note (Signed)
CARE MANAGEMENT NOTE 12/15/2013  Patient:  [name], [name]   Account Number:  [id number]  Date Initiated:  12/15/2013  Documentation initiated by:  Ricki Miller  Subjective/Objective Assessment:   80 yr old female admitted s/p fall with right femoral neck fracture. Patient  will go to surgery for right hip hemiarthroplasty.     Action/Plan:   Patient is from [facility].   Anticipated DC Date:  12/18/2013   Anticipated DC Plan:  SKILLED NURSING FACILITY  In-house referral  Clinical Social Worker      DC Planning Services  CM consult      Choice offered to / List presented to:     DME arranged  NA        [place] arranged  NA      Status of service:  In process, will continue to follow Medicare Important Message given?   (If response is "NO", the following Medicare IM given date fields will be blank) Date Medicare IM given:   Medicare IM given by:   Date Additional Medicare IM given:   Additional Medicare IM given by:    Discharge Disposition:    Per UR Regulation:  Reviewed for med. necessity/level of care/duration of stay

## 2013-12-15 NOTE — Clinical Social Work Note (Signed)
CSW left message for patient's son Nadara Mustard to complete assessment.  Patient is from [place] per SNF liaison.  CSW awaiting a return call from patient son.  RN requested CSW to follow up with SNF regarding patient's diet and medication administration.  Liaison reports patient diet: thin liquids and mechanical soft and medications crushed for swallow.  RN made aware.  Nonnie Done, [place] [phone]  Psychiatric & Orthopedics (5N 1-16) Clinical Social Worker

## 2013-12-15 NOTE — Anesthesia Procedure Notes (Signed)
Procedure Name: Intubation Date/Time: 12/15/2013 5:30 PM Performed by: Susa Loffler Pre-anesthesia Checklist: Patient identified, Timeout performed, Emergency Drugs available, Suction available and Patient being monitored Patient Re-evaluated:Patient Re-evaluated prior to inductionOxygen Delivery Method: Circle system utilized Preoxygenation: Pre-oxygenation with 100% oxygen Intubation Type: IV induction Laryngoscope Size: Mac and 3 Grade View: Grade I Tube type: Oral Tube size: 7.0 mm Number of attempts: 1 Airway Equipment and Method: Stylet Placement Confirmation: ETT inserted through vocal cords under direct vision,  positive ETCO2 and breath sounds checked- equal and bilateral Secured at: 21 cm Tube secured with: Tape Dental Injury: Teeth and Oropharynx as per pre-operative assessment

## 2013-12-15 NOTE — Progress Notes (Signed)
Have attempted to call son Nadara Mustard for verbal surgical consent several times this am. Cell phone goes straight to voicemail. I also spoke to Pts SNF concerning their ability to get in touch with son. They stated he was hard to get in touch with but after several calls he would eventually return the call. I left him a voicemail concerning the need for consent for surgery today. Will attempt to call again at a later time.

## 2013-12-15 NOTE — Anesthesia Postprocedure Evaluation (Signed)
  Anesthesia Post-op Note  Patient: [name]  Procedure(s) Performed: Procedure(s): HEMIARTHROPLASTY RIGHTHIP (Right)  Patient Location: PACU  Anesthesia Type:General  Level of Consciousness: awake and alert   Airway and Oxygen Therapy: Patient Spontanous Breathing  Post-op Pain: mild  Post-op Assessment: Post-op Vital signs reviewed  Post-op Vital Signs: stable  Last Vitals:  Filed Vitals:   12/15/13 1941  BP: 137/87  Pulse: 73  Temp: 36.3 C  Resp: 14    Complications: No apparent anesthesia complications

## 2013-12-15 NOTE — Op Note (Signed)
12/14/2013 - 12/15/2013  6:20 PM  PATIENT:  [name]    PRE-OPERATIVE DIAGNOSIS:  right hip fracture  POST-OPERATIVE DIAGNOSIS:  Same  PROCEDURE:  HEMIARTHROPLASTY RIGHTHIP  SURGEON:  Klare Criss V, MD  PHYSICIAN ASSISTANT:None ANESTHESIA:   General  PREOPERATIVE INDICATIONS:  [name] is a  80 y.o. female with a diagnosis of right hip fracture who failed conservative measures and elected for surgical management.    The risks benefits and alternatives were discussed with the patient preoperatively including but not limited to the risks of infection, bleeding, nerve injury, cardiopulmonary complications, the need for revision surgery, among others, and the patient was willing to proceed.  OPERATIVE IMPLANTS: Zimmer implants. Size 12 stem. Size 46 head. Bipolar.   OPERATIVE FINDINGS: Extremely thin osteoporotic bone with a small avulsion of approximately 10% of the greater trochanter  OPERATIVE PROCEDURE: Patient was brought to the operating room and underwent a general anesthetic. After adequate levels of anesthesia were obtained patient was placed in the left lateral decubitus position with the right side up and the right lower extremity was prepped using DuraPrep draped into a sterile field. A posterior lateral incision was made this carried down to the tensor fascia lata which was split. The piriformis short external rotators and piriformis were retracted off the femoral neck including the capsule this was tagged. The femoral neck cut was made 1 cm proximal to the calcar. The head was delivered this sized for a 46 mm head. The 46 mm head had a good trial fit. The wound was irrigated with normal saline. The stem was sequentially broached to a size 12. The size 12 stem was placed followed by the bipolar head this was reduced the hip was stable. The wound was irrigated with normal saline throughout the case. The capsule was closed using #1 Vicryl. The tensor fascia lata was  closed using #1 Vicryl. Subcutaneous is closed using 0 Vicryl. Skin was closed using staples. Mepilex dressing was applied. Patient was extubated taken to the PACU in stable condition.

## 2013-12-15 NOTE — Anesthesia Preprocedure Evaluation (Addendum)
Anesthesia Evaluation  Patient identified by MRN, date of birth, ID band Patient confused    Reviewed: Allergy & Precautions, H&P , NPO status , Patient's Chart, lab work & pertinent test results, Unable to perform ROS - Chart review only  Airway Mallampati: II  TM Distance: >3 FB Neck ROM: Full    Dental   Pulmonary former smoker,          Cardiovascular hypertension, Pt. on medications and Pt. on home beta blockers + CAD and + Peripheral Vascular Disease     Neuro/Psych PSYCHIATRIC DISORDERS Depression Alzheimer's   GI/Hepatic GERD-  Medicated and Controlled,  Endo/Other    Renal/GU      Musculoskeletal   Abdominal   Peds  Hematology  (+) anemia ,   Anesthesia Other Findings   Reproductive/Obstetrics                           Anesthesia Physical Anesthesia Plan  ASA: III  Anesthesia Plan: General   Post-op Pain Management:    Induction: Intravenous  Airway Management Planned: Oral ETT  Additional Equipment:   Intra-op Plan:   Post-operative Plan: Extubation in OR  Informed Consent: I have reviewed the patients History and Physical, chart, labs and discussed the procedure including the risks, benefits and alternatives for the proposed anesthesia with the patient or authorized representative who has indicated his/her understanding and acceptance.   Dental advisory given  Plan Discussed with: Surgeon and CRNA  Anesthesia Plan Comments:       Anesthesia Quick Evaluation

## 2013-12-15 NOTE — Progress Notes (Signed)
INITIAL NUTRITION ASSESSMENT  DOCUMENTATION CODES Per approved criteria  -Not Applicable   INTERVENTION: Once diet advances, continue providing 90 ml of Osmolite 1.5 formula orally 3 times daily.   NUTRITION DIAGNOSIS: Increased nutrient needs related to surgery as evidenced by estimated nutrition needs.   Goal: Pt to meet >/= 90% of their estimated nutrition needs   Monitor:  Diet advancement, weight trends, labs, I/O's  Reason for Assessment: MST  80 y.o. female  Admitting Dx: Fall  ASSESSMENT: with significant past medical history of dementia, osteroporosis, CAD, HTN presenting with fall, R hip fracture. Level V caveat as pt is demented. Per report, pt had unwitnessed fall at SNF. Had xray done at nursing home that showed R hip fracture.  Pt is currently NPO for surgery. During time of visit, pt was unable to answer most questions asked, however pt was able to tell me that her body was in pain. Unable to obtain nutrition information at this time. No family at bedside. Per Epic weight records, pt with a 4% weight loss in 1 week. Per H&P, PTA at SNF pt was consuming 90 ml of TwoCal HN TID. Will continue with current orders.   Unable to perform nutrition focused physical exam at this time as pt reports body pain.  Height: Ht Readings from Last 1 Encounters:  12/14/13 5\' 1"  (1.549 m)    Weight: Wt Readings from Last 1 Encounters:  12/15/13 116 lb 6.5 oz (52.8 kg)    Ideal Body Weight: 105 lbs  % Ideal Body Weight: 110%  Wt Readings from Last 10 Encounters:  12/15/13 116 lb 6.5 oz (52.8 kg)  12/09/13 121 lb (54.885 kg)  10/21/13 120 lb (54.432 kg)  04/08/13 121 lb (54.885 kg)  02/18/13 121 lb (54.885 kg)  09/17/12 126 lb 12.2 oz (57.5 kg)  09/10/12 122 lb (55.339 kg)  08/13/12 128 lb (58.06 kg)  06/03/12 130 lb (58.968 kg)  04/09/12 128 lb 6.4 oz (58.242 kg)    Usual Body Weight: 121 lbs  % Usual Body Weight: 96%  BMI:  Body mass index is 22.01  kg/(m^2).  Estimated Nutritional Needs: Kcal: 1600-1800 Protein: 65-80 grams Fluid: 1.6 - 1.8 L/day  Skin: non-pitting LE edema  Diet Order: Diet NPO time specified  EDUCATION NEEDS: -Education not appropriate at this time   Intake/Output Summary (Last 24 hours) at 12/15/13 0955 Last data filed at 12/15/13 0340  Gross per 24 hour  Intake    575 ml  Output    250 ml  Net    325 ml    Last BM: WDL  Labs:   Recent Labs Lab 12/14/13 1556 12/14/13 1953  NA 142  --   K 4.1  --   CL 106  --   CO2 23  --   BUN 32*  --   CREATININE 0.84 0.79  CALCIUM 9.7  --   GLUCOSE 111*  --     CBG (last 3)  No results for input(s): GLUCAP in the last 72 hours.  Scheduled Meds: . [START ON 12/21/2013] alendronate  70 mg Oral Q7 days  . amLODipine  10 mg Oral Daily  . aspirin EC  81 mg Oral Daily  . atorvastatin  5 mg Oral QHS  . calcium-vitamin D  1 tablet Oral Daily  .  ceFAZolin (ANCEF) IV  2 g Intravenous On Call to OR  . chlorhexidine  60 mL Topical Once  . cholecalciferol  2,000 Units Oral Daily  . divalproex  125 mg Oral BID  . donepezil  10 mg Oral Daily  . DULoxetine  20 mg Oral Daily  . ertapenem (INVANZ) IV  1 g Intravenous Q24H  . feeding supplement (OSMOLITE 1.5 CAL)  90 mL Oral TID  . ferrous sulfate  325 mg Oral Q breakfast  . heparin  5,000 Units Subcutaneous 3 times per day  . metoprolol  25 mg Oral BID  . multivitamin with minerals  1 tablet Oral Daily  . senna-docusate  1 tablet Oral Daily  . sodium chloride  3 mL Intravenous Q12H  . sucralfate  1 g Oral TID AC & HS    Continuous Infusions: . sodium chloride 75 mL/hr at 12/14/13 2040    Past Medical History  Diagnosis Date  . Diverticulosis of colon (without mention of hemorrhage) 06/24/2006  . Vitamin B12 deficiency   . Alzheimer's disease   . HTN (hypertension)   . Depression   . PVD (peripheral vascular disease)   . CAD (coronary artery disease)   . GERD (gastroesophageal reflux disease)    . H. pylori infection 2004  . Presbyesophagus   . Iron deficiency anemia, unspecified 01/02/2012  . Personal history of fall 01/04/2009  . Unspecified constipation   . Hemorrhage of rectum and anus   . Chest pain, unspecified   . Acute pharyngitis   . Edema   . Cataract   . HLD (hyperlipidemia)   . Osteoporosis, unspecified   . Carcinoma of cecum 1992    History of  . Malignant neoplasm of colon, unspecified site     Past Surgical History  Procedure Laterality Date  . Gallbladder surgery    . Abdominal hysterectomy  1995    with bso  . Right colectomy    . Cataract extraction  2001  . Cholecystectomy Right     Kallie Locks, MS, RD, LDN Pager # [phone] After hours/ weekend pager # [phone]

## 2013-12-15 NOTE — Progress Notes (Signed)
RN called Bindu RN on [place] to let her know that the patient was being transported by [facility] to [facility].  Letesha Klecker (son) was telephoned to notiify him that his mother was being transferred to [facility] on 5 N room [place] @  [phone]

## 2013-12-15 NOTE — Progress Notes (Signed)
PCP notified the RN to say that the RN should notify Dr. Ernestina Patches that the patient needs transferred to [facility]. RN spoke with Dr. Ernestina Patches. Patient to be transferred to [facility] on 5 N 12 C.

## 2013-12-15 NOTE — Progress Notes (Addendum)
PROGRESS NOTE    [name] JKD:[id number] DOB: [birth year] DOA: 12/14/2013 PCP: Hollace Kinnier, DO  HPI/Brief narrative 80 year old female patient, SNF resident, with history of advanced Alzheimer's dementia, essential hypertension, CAD, anemia, recurrent falls, HLD, osteoporosis, presented with fall leading to right hip fracture. No reported LOC per staff. Patient also on ertapenem for ESBL UTI 11/20-11/30. Dr. Sharol Given discussed with patient's son and plans are for right hip hemiarthroplasty 11/24.   Assessment/Plan:  1. Right femoral neck fracture, sustained status post fall at SNF: Orthopedics consultation appreciated and plans for a right hip hemiarthroplasty 11/24. No reported cardiorespiratory symptoms. EKG without acute findings. 2-D echo 09/10/12: LVEF 55-60 percent. Based on available data, patient is at low risk for perioperative CV event and may proceed with needed surgery without any further cardiac workup. Continue beta blockers perioperatively. 2. ESBL UTI: Patient was on ertapenem PTA-continue same incomplete 11/30. 3. Advanced Alzheimer's dementia: Mental status likely at baseline. No agitation. Patient on Depakote which may be for mood stabilization rather than seizures. 4. Essential hypertension: fluctuating but reasonable inpatient control. Continue amlodipine and beta blockers. 5. Anemia: Stable. Follow CBC postoperatively. 6.  History of CAD: No reported chest pain. 7.  DO NOT RESUSCITATE 8. History of dysphagia: Nursing's GYN check with SNF regarding prior diet. 9. History of recurrent falls. 10. ? L3 compression fracture: Seen on x-ray. No reported back pain. Consider further evaluation if symptomatic. 11. Constipation: Significant stool seen on x-ray. Bowel regimen postoperatively. 12. Prolonged QTC: Potassium 4.1. Check magnesium and aim to keep >2. Repeat EKG. Avoid medications that prolong QTC. Continue monitoring on telemetry.   Code Status: DO NOT RESUSCITATE    Family Communication: none at bedside Disposition Plan:return to SNF when medically stable.   Consultants:  Orthopedics  Procedures:  None  Antibiotics:  Ertapenum   Subjective: Pleasantly confused. Denies pain, SOB or CP. As per nursing. No acute events.   Objective: Filed Vitals:   12/14/13 2148 12/14/13 2330 12/15/13 0333 12/15/13 0500  BP: 166/48 158/72 137/70 115/56  Pulse: 96 91 78 67  Temp:  98.6 F (37 C)  98.2 F (36.8 C)  TempSrc:  Oral  Oral  Resp:  16 15   Height:      Weight:    52.8 kg (116 lb 6.5 oz)  SpO2:  93% 90% 93%    Intake/Output Summary (Last 24 hours) at 12/15/13 1119 Last data filed at 12/15/13 0340  Gross per 24 hour  Intake    575 ml  Output    250 ml  Net    325 ml   Filed Weights   12/14/13 2015 12/15/13 0500  Weight: 52.8 kg (116 lb 6.5 oz) 52.8 kg (116 lb 6.5 oz)     Exam:  General exam:  elderly frail pleasantly confused female patient lying comfortably in bed. Respiratory system: Clear. No increased work of breathing. Cardiovascular system: S1 & S2 heard, RRR. No JVD, murmurs, gallops, clicks or pedal edema. Telemetry: Sinus rhythm. Gastrointestinal system: Abdomen is nondistended, soft and nontender. Normal bowel sounds heard. Central nervous system: Alert and oriented only to self . No focal neurological deficits. Extremities: Symmetric 5 x 5 power. Right lower extremity shortened and internally rotated. Right lower extremity movements limited secondary to pain.   Data Reviewed: Basic Metabolic Panel:  Recent Labs Lab 12/14/13 1556 12/14/13 1953  NA 142  --   K 4.1  --   CL 106  --   CO2 23  --  GLUCOSE 111*  --   BUN 32*  --   CREATININE 0.84 0.79  CALCIUM 9.7  --    Liver Function Tests: No results for input(s): AST, ALT, ALKPHOS, BILITOT, PROT, ALBUMIN in the last 168 hours. No results for input(s): LIPASE, AMYLASE in the last 168 hours. No results for input(s): AMMONIA in the last 168  hours. CBC:  Recent Labs Lab 12/14/13 1556 12/14/13 1953  WBC 12.4* 11.2*  NEUTROABS 9.5*  --   HGB 11.7* 11.7*  HCT 35.6* 35.6*  MCV 89.9 88.8  PLT 239 234   Cardiac Enzymes: No results for input(s): CKTOTAL, CKMB, CKMBINDEX, TROPONINI in the last 168 hours. BNP (last 3 results) No results for input(s): PROBNP in the last 8760 hours. CBG: No results for input(s): GLUCAP in the last 168 hours.  No results found for this or any previous visit (from the past 240 hour(s)).     Studies: Dg Chest 2 View  12/14/2013   CLINICAL DATA:  Weakness  EXAM: CHEST  2 VIEW  COMPARISON:  09/17/2012  FINDINGS: Cardiac shadow is stable. Right basilar atelectasis is noted. No focal confluent infiltrate or sizable effusion is seen. Compression deformity of T11 is noted and stable. No acute bony abnormality is seen.  IMPRESSION: Right basilar atelectasis.   Electronically Signed   By: Inez Catalina M.D.   On: 12/14/2013 18:25   Dg Hip Complete Right  12/14/2013   CLINICAL DATA:  Fall.  Right hip pain.  EXAM: RIGHT HIP - COMPLETE 2+ VIEW  COMPARISON:  None.  FINDINGS: The bones are diffusely osteopenic. There is an acute, impacted right femoral neck fracture. On the cross-table lateral view, the distal fracture fragment is impacted and anteriorly displaced approximately 2 cm. The right femoral head remains located in the right acetabulum.  Proximal left femur appears intact. There is slight joint space narrowing of the hips bilaterally. Pubic bones appear intact. Sacroiliac joints and pubic symphysis are unremarkable. Surgical suture and clips are noted in the right lower quadrant. Fairly prominent amount of stool in the rectum. Visualized bowel gas pattern nonobstructive.  In the frontal projection, the height of the L3 vertebral body appears decreased, for which a compression fracture, age-indeterminate cannot be excluded. No prior images are available of the lumbar spine.  IMPRESSION: 1. Acute, impacted  right femoral neck fracture. The distal fracture fragment is anteriorly displaced approximately 2 cm on the cross-table lateral view. 2. Decreased bony mineralization. 3. Cannot exclude an L3 compression fracture. No prior images of the lumbar spine available. 4. Prominent amount of stool in the rectum.   Electronically Signed   By: Curlene Dolphin M.D.   On: 12/14/2013 15:38        Scheduled Meds: . [START ON 12/21/2013] alendronate  70 mg Oral Q7 days  . amLODipine  10 mg Oral Daily  . aspirin EC  81 mg Oral Daily  . atorvastatin  5 mg Oral QHS  . calcium-vitamin D  1 tablet Oral Daily  .  ceFAZolin (ANCEF) IV  2 g Intravenous On Call to OR  . chlorhexidine  60 mL Topical Once  . cholecalciferol  2,000 Units Oral Daily  . divalproex  125 mg Oral BID  . donepezil  10 mg Oral Daily  . DULoxetine  20 mg Oral Daily  . ertapenem (INVANZ) IV  1 g Intravenous Q24H  . feeding supplement (OSMOLITE 1.5 CAL)  90 mL Oral TID  . ferrous sulfate  325 mg Oral Q  breakfast  . heparin  5,000 Units Subcutaneous 3 times per day  . metoprolol  25 mg Oral BID  . multivitamin with minerals  1 tablet Oral Daily  . senna-docusate  1 tablet Oral Daily  . sodium chloride  3 mL Intravenous Q12H  . sucralfate  1 g Oral TID AC & HS   Continuous Infusions: . sodium chloride 75 mL/hr at 12/15/13 1116    Active Problems:   Fall   Hip fracture, right    Time spent: 11 minutes    HONGALGI,ANAND, MD, FACP, FHM. [facility] Pager [phone]  If 7PM-7AM, please contact night-coverage [email or web address] Password [email or web address] 12/15/2013, 11:19 AM    LOS: 1 day

## 2013-12-16 ENCOUNTER — Encounter (HOSPITAL_COMMUNITY): Payer: Self-pay | Admitting: Orthopedic Surgery

## 2013-12-16 DIAGNOSIS — F0281 Dementia in other diseases classified elsewhere with behavioral disturbance: Secondary | ICD-10-CM

## 2013-12-16 DIAGNOSIS — G309 Alzheimer's disease, unspecified: Secondary | ICD-10-CM

## 2013-12-16 DIAGNOSIS — S72009A Fracture of unspecified part of neck of unspecified femur, initial encounter for closed fracture: Secondary | ICD-10-CM

## 2013-12-16 DIAGNOSIS — W19XXXA Unspecified fall, initial encounter: Secondary | ICD-10-CM

## 2013-12-16 DIAGNOSIS — F22 Delusional disorders: Secondary | ICD-10-CM

## 2013-12-16 DIAGNOSIS — R1310 Dysphagia, unspecified: Secondary | ICD-10-CM

## 2013-12-16 DIAGNOSIS — S72001A Fracture of unspecified part of neck of right femur, initial encounter for closed fracture: Principal | ICD-10-CM

## 2013-12-16 LAB — COMPREHENSIVE METABOLIC PANEL WITH GFR
ALT: 10 U/L (ref 0–35)
AST: 16 U/L (ref 0–37)
Albumin: 2.6 g/dL — ABNORMAL LOW (ref 3.5–5.2)
Alkaline Phosphatase: 49 U/L (ref 39–117)
Anion gap: 13 (ref 5–15)
BUN: 20 mg/dL (ref 6–23)
CO2: 23 meq/L (ref 19–32)
Calcium: 8.8 mg/dL (ref 8.4–10.5)
Chloride: 109 meq/L (ref 96–112)
Creatinine, Ser: 0.64 mg/dL (ref 0.50–1.10)
GFR calc Af Amer: 89 mL/min — ABNORMAL LOW
GFR calc non Af Amer: 77 mL/min — ABNORMAL LOW
Glucose, Bld: 102 mg/dL — ABNORMAL HIGH (ref 70–99)
Potassium: 3.9 meq/L (ref 3.7–5.3)
Sodium: 145 meq/L (ref 137–147)
Total Bilirubin: 0.7 mg/dL (ref 0.3–1.2)
Total Protein: 5.6 g/dL — ABNORMAL LOW (ref 6.0–8.3)

## 2013-12-16 LAB — CBC WITH DIFFERENTIAL/PLATELET
Basophils Absolute: 0 10*3/uL (ref 0.0–0.1)
Basophils Relative: 0 % (ref 0–1)
Eosinophils Absolute: 0.1 10*3/uL (ref 0.0–0.7)
Eosinophils Relative: 1 % (ref 0–5)
HCT: 28.5 % — ABNORMAL LOW (ref 36.0–46.0)
Hemoglobin: 9 g/dL — ABNORMAL LOW (ref 12.0–15.0)
Lymphocytes Relative: 14 % (ref 12–46)
Lymphs Abs: 1.4 10*3/uL (ref 0.7–4.0)
MCH: 28.4 pg (ref 26.0–34.0)
MCHC: 31.6 g/dL (ref 30.0–36.0)
MCV: 89.9 fL (ref 78.0–100.0)
Monocytes Absolute: 0.8 10*3/uL (ref 0.1–1.0)
Monocytes Relative: 8 % (ref 3–12)
Neutro Abs: 7.6 10*3/uL (ref 1.7–7.7)
Neutrophils Relative %: 77 % (ref 43–77)
Platelets: 202 10*3/uL (ref 150–400)
RBC: 3.17 MIL/uL — ABNORMAL LOW (ref 3.87–5.11)
RDW: 13.5 % (ref 11.5–15.5)
WBC: 9.9 10*3/uL (ref 4.0–10.5)

## 2013-12-16 LAB — URINE CULTURE
COLONY COUNT: NO GROWTH
Culture: NO GROWTH

## 2013-12-16 MED ORDER — HYDROCODONE-ACETAMINOPHEN 5-325 MG PO TABS: 1.0000 | ORAL_TABLET | Freq: Four times a day (QID) | ORAL | Status: DC | PRN

## 2013-12-16 MED ORDER — POLYETHYLENE GLYCOL 3350 17 G PO PACK
17.0000 g | PACK | Freq: Every day | ORAL | Status: DC
  Administered 2013-12-16 – 2013-12-17 (×2): 17 g via ORAL
  Filled 2013-12-16 (×3): qty 1

## 2013-12-16 MED ORDER — ENOXAPARIN SODIUM 40 MG/0.4ML ~~LOC~~ SOLN: 40.0000 mg | SUBCUTANEOUS | Status: DC

## 2013-12-16 NOTE — Progress Notes (Signed)
OT Cancellation Note  Patient Details Name: [name] MRN: [id number] DOB: 02/14/1922   Cancelled Treatment:    Reason Eval/Treat Not Completed: Patient not medically ready. Pt has active bedrest orders. OT will follow up as available for evaluation when pt is medically ready.  [place] Herb M   Cyndie Chime, OTR/L Occupational Therapist [phone] (pager)  12/16/2013, 7:58 AM

## 2013-12-16 NOTE — Progress Notes (Signed)
Utilization review completed. Braylinn Gulden, RN, BSN. 

## 2013-12-16 NOTE — Care Management Note (Addendum)
CARE MANAGEMENT NOTE 12/16/2013  Patient:  [name], [name]   Account Number:  [id number]  Date Initiated:  12/15/2013  Documentation initiated by:  Ricki Miller  Subjective/Objective Assessment:   80 yr old female admitted s/p fall with right femoral neck fracture. Patient  will go to surgery for right hip hemiarthroplasty.     Action/Plan:   Patient is from [facility]. Social worker has been notified, patient will return to [facility].   Anticipated DC Date:  12/17/2013   Anticipated DC Plan:  SKILLED NURSING FACILITY  In-house referral  Clinical Social Worker      DC Planning Services  CM consult      Choice offered to / List presented to:     DME arranged  NA        [place] arranged  NA      Status of service:  Completed, signed off Medicare Important Message given?  (If response is "NO", the following Medicare IM given date fields will be blank) Date Medicare IM given:  Medicare IM given by:   Date Additional Medicare IM given:   Additional Medicare IM given by:    Discharge Disposition:  [facility] [place]  Per UR Regulation:  Reviewed for med. necessity/level of care/duration of stay  If discussed at [place] of Stay Meetings, dates discussed:    Comments:

## 2013-12-16 NOTE — Progress Notes (Signed)
Patient ID: [name], female   DOB: 1922-12-15, 80 y.o.   MRN: [id number] Postoperative day 1 right hip hemiarthroplasty. Patient has rested comfortably postoperatively. Patient is safe for discharge back to skilled nursing. Patient is for transfers only no weightbearing no gait training. I will follow-up as an outpatient.

## 2013-12-16 NOTE — Clinical Social Work Psychosocial (Signed)
Clinical Social Work Department BRIEF PSYCHOSOCIAL ASSESSMENT 12/16/2013  Patient:  [name], [name]     Account Number:  [id number]     [place] date:  12/14/2013  Clinical Social Worker:  Wylene Men  Date/Time:  12/16/2013 11:52 AM  Referred by:  Physician  Date Referred:  12/16/2013 Referred for  SNF Placement  Psychosocial assessment   Other Referral:   none   Interview type:  Other - See comment Other interview type:   patient, attempts made to contact patient's son, Nadara Mustard, spoke with Octavia Bruckner and [employer]    PSYCHOSOCIAL DATA Living Status:  FACILITY Admitted from facility:  [place], [place] of care:  [place] Primary support name:  Fraser Din and [name] Primary support relationship to patient:  FRIEND Degree of support available:   adequate    CURRENT CONCERNS Current Concerns  Post-Acute Placement   Other Concerns:   none    SOCIAL WORK ASSESSMENT / PLAN Patient has hx dx of dementia and baseline is confused. Patient has son, Nadara Mustard listed on chart.  CSW made multiple attempts to contact son with no success.  Of report, staff has also made numerous attempts with no success.  Of report, patient is from [facility]. CSW spoke with patient friend Octavia Bruckner and Fraser Din who both spend significant time with patient at [facility].  Fraser Din states that she has been caring for [facility] since 1985 and has not seen patient's son the first time.  Of report from [employer], patient's on is estranged and uninvolved. Fraser Din states that patient receives good care at [facility]. Patient will return to [facility] tomorrow via [place].  Fraser Din and Octavia Bruckner were updated and agreeable to this plan of discharge.   Assessment/plan status:  Psychosocial Support/Ongoing Assessment of Needs Other assessment/ plan:   FL2 updated  PASARR existing   Information/referral to community resources:   SNF/STR    PATIENT'S/FAMILY'S RESPONSE TO PLAN OF  CARE: Patient is from [facility] and will return once discharged.  Projected dc per MD is tomorrow.       Nonnie Done, [place] [phone]  Psychiatric & Orthopedics (5N 1-16) Clinical Social Worker

## 2013-12-16 NOTE — Discharge Summary (Signed)
Physician Discharge Summary  [name] OAC:[id number] DOB: 09-27-1922 DOA: 12/14/2013  PCP: Hollace Kinnier, DO  Admit date: 12/14/2013 Discharge date: 12/16/2013  Time spent: 40 minutes  Recommendations for Outpatient Follow-up:  1. Follow-up with primary care physician in one week. 2. Follow-up with Dr. Sharol Given within 1 week. 3. Continue Lovenox for 3-4 weeks for DVT prophylaxis.  Discharge Diagnoses:  Active Problems:   Fall   Hip fracture, right   Discharge Condition: Stable  Diet recommendation: heart healthy  Filed Weights   12/14/13 2015 12/15/13 0500 12/16/13 0500  Weight: 52.8 kg (116 lb 6.5 oz) 52.8 kg (116 lb 6.5 oz) 57.7 kg (127 lb 3.3 oz)    History of present illness:  This is a 80 y.o. year old female with significant past medical history of dementia, osteroporosis, CAD, HTN presenting with fall, R hip fracture. Level V caveat as pt is demented. Per report, pt had unwitnessed fall at SNF. No reported LOC per staff. Had xray done at nursing home that showed R hip fracture. Pt noted to be on ertapenem for ESBL UTI 11/20-11/30. No reported fevers. Per EDP, case was discussed with pt's son [name], who is "open to suggestions" for care. Case discussed with Sharol Given per EDP, who feels pt needs surgical correction as well as admission to [facility]. Dr. Sharol Given is in process of discussing case with son.  On presentation hemodynamically stable, afebrile. WBC 12.4, hgb 11.7, Cr 0.84. CXR WNL apart from R basilar atelectasis. R hip xray shows Acute, impacted right femoral neck fracture. The distal fracture fragment is anteriorly displaced approximately 2 cm on the cross-table lateral view. Cannot exclude an L3 compression fracture.  Hospital Course:   1. Right femoral neck fracture, sustained status post fall at SNF: Orthopedics consultation appreciated. Right hip hemiarthroplasty done on 11/24 . No reported cardiorespiratory symptoms. EKG without acute findings. 2-D echo 09/10/12:  LVEF 55-60 percent. Based on available data, patient is at low risk for perioperative CV event and may proceed with needed surgery without any further cardiac workup. Continue beta blockers perioperatively. Continue narcotic pain medications for pain control and Lovenox for DVT prophylaxis. 2. ESBL UTI: Patient was on ertapenem PTA-continue same to be completed on 11/30. 3. Advanced Alzheimer's dementia: Mental status likely at baseline. No agitation. Patient on Depakote which may be for mood stabilization rather than seizures. 4. Essential hypertension: fluctuating but reasonable inpatient control. Continue amlodipine and beta blockers. 5. Anemia: Stable. Follow CBC postoperatively. 6. History of CAD: No reported chest pain. 7. DO NOT RESUSCITATE 8. History of dysphagia:Dysphagia 2 diet with thin liquids.  9. History of recurrent falls. 10. ? L3 compression fracture: Seen on x-ray. No reported back pain. Consider further evaluation if symptomatic. 11. Constipation: Started on MiraLAX. 12. Prolonged QTC:No symptoms, repeat EKG showed resolution.  Procedures:  Right hip hemiarthroplasty done by Dr. Sharol Given on 11/20 05/11/2013.  Consultations:  Orthopedics  Discharge Exam: Filed Vitals:   12/16/13 0600  BP: 129/41  Pulse: 91  Temp: 97.8 F (36.6 C)  Resp: 16   General: Alert but disoriented, not in any acute distress. HEENT: anicteric sclera, pupils reactive to light and accommodation, EOMI CVS: S1-S2 clear, no murmur rubs or gallops Chest: clear to auscultation bilaterally, no wheezing, rales or rhonchi Abdomen: soft nontender, nondistended, normal bowel sounds, no organomegaly Extremities: no cyanosis, clubbing or edema noted bilaterally Neuro: Cranial nerves II-XII intact, no focal neurological deficits  Discharge Instructions You were cared for by a hospitalist during your hospital  stay. If you have any questions about your discharge medications or the care you received while you  were in the hospital after you are discharged, you can call the unit and asked to speak with the hospitalist on call if the hospitalist that took care of you is not available. Once you are discharged, your primary care physician will handle any further medical issues. Please note that NO REFILLS for any discharge medications will be authorized once you are discharged, as it is imperative that you return to your primary care physician (or establish a relationship with a primary care physician if you do not have one) for your aftercare needs so that they can reassess your need for medications and monitor your lab values.  Discharge Instructions    Diet - low sodium heart healthy    Complete by:  As directed      Increase activity slowly    Complete by:  As directed      Non weight bearing    Complete by:  As directed   Laterality:  right  Extremity:  Lower          Current Discharge Medication List    START taking these medications   Details  !! aspirin EC 325 MG tablet Take 1 tablet (325 mg total) by mouth daily. Qty: 30 tablet, Refills: 0    enoxaparin (LOVENOX) 40 MG/0.4ML injection Inject 0.4 mLs (40 mg total) into the skin daily. Qty: 28 Syringe, Refills: 0    HYDROcodone-acetaminophen (NORCO) 5-325 MG per tablet Take 1 tablet by mouth every 6 (six) hours as needed for moderate pain. Qty: 10 tablet, Refills: 0     !! - Potential duplicate medications found. Please discuss with provider.    CONTINUE these medications which have NOT CHANGED   Details  acetaminophen (TYLENOL) 500 MG tablet Take 1,000 mg by mouth every 8 (eight) hours as needed for mild pain.    alendronate (FOSAMAX) 70 MG tablet Take 70 mg by mouth every 7 (seven) days. Takes on Mondays. Take with a full glass of water on an empty stomach.    amLODipine (NORVASC) 10 MG tablet Take 10 mg by mouth daily.    !! aspirin EC 81 MG tablet Take 81 mg by mouth daily.    atorvastatin (LIPITOR) 10 MG tablet Take 5 mg by  mouth at bedtime. Half a tab.    Calcium Carbonate-Vitamin D (CALCIUM-VITAMIN D) 500-200 MG-UNIT per tablet Take 1 tablet by mouth daily.    Cholecalciferol (VITAMIN D) 2000 UNITS tablet Take 2,000 Units by mouth daily.    divalproex (DEPAKOTE SPRINKLE) 125 MG capsule Take 125 mg by mouth 2 (two) times daily.     donepezil (ARICEPT) 10 MG tablet Take 10 mg by mouth daily.     !! DULoxetine (CYMBALTA) 20 MG capsule Take 20 mg by mouth daily. At 2pm    !! DULoxetine (CYMBALTA) 60 MG capsule Take 60 mg by mouth daily. Morning    ertapenem 1 g in sodium chloride 0.9 % 50 mL Inject 1 g into the vein daily. For 10 days. Started 12-11-13 to 12-21-13    ferrous sulfate 325 (65 FE) MG tablet Take 325 mg by mouth daily with breakfast.    ipratropium-albuterol (DUONEB) 0.5-2.5 (3) MG/3ML SOLN Take 3 mLs by nebulization every 6 (six) hours as needed (shortness of breath.).    lisinopril (PRINIVIL,ZESTRIL) 10 MG tablet Take 10 mg by mouth daily.    metoprolol (LOPRESSOR) 25 MG tablet Take 1  tablet (25 mg total) by mouth 2 (two) times daily. If pulse <50 or SBP <100 hold med and notify MD. Otho Darner: 60 tablet, Refills: 0   Associated Diagnoses: Essential hypertension    Multiple Vitamin (MULTIVITAMIN WITH MINERALS) TABS tablet Take 1 tablet by mouth daily.    Nutritional Supplements (TWOCAL HN) LIQD Take 90 mLs by mouth 3 (three) times daily.     sennosides-docusate sodium (SENOKOT-S) 8.6-50 MG tablet Take 1 tablet by mouth daily.    sucralfate (CARAFATE) 1 GM/10ML suspension Take 1 g by mouth 4 (four) times daily -  before meals and at bedtime.    vitamin B-12 (CYANOCOBALAMIN) 1000 MCG tablet Take 1,000 mcg by mouth every other day.     !! - Potential duplicate medications found. Please discuss with provider.     Allergies  Allergen Reactions  . Captopril-Hydrochlorothiazide Other (See Comments)    Reaction unknown  . Niacin And Related Other (See Comments)    Reaction unknown  . Urispas  [Flavoxate] Other (See Comments)    Reaction unknown  . Verapamil Other (See Comments)    Reaction unknown   Follow-up Information    Follow up with DUDA,MARCUS V, MD In 3 weeks.   Specialty:  Orthopedic Surgery   Contact information:   [place] [address] [phone]        The results of significant diagnostics from this hospitalization (including imaging, microbiology, ancillary and laboratory) are listed below for reference.    Significant Diagnostic Studies: Dg Chest 2 View  12/14/2013   CLINICAL DATA:  Weakness  EXAM: CHEST  2 VIEW  COMPARISON:  09/17/2012  FINDINGS: Cardiac shadow is stable. Right basilar atelectasis is noted. No focal confluent infiltrate or sizable effusion is seen. Compression deformity of T11 is noted and stable. No acute bony abnormality is seen.  IMPRESSION: Right basilar atelectasis.   Electronically Signed   By: Inez Catalina M.D.   On: 12/14/2013 18:25   Dg Hip Complete Right  12/14/2013   CLINICAL DATA:  Fall.  Right hip pain.  EXAM: RIGHT HIP - COMPLETE 2+ VIEW  COMPARISON:  None.  FINDINGS: The bones are diffusely osteopenic. There is an acute, impacted right femoral neck fracture. On the cross-table lateral view, the distal fracture fragment is impacted and anteriorly displaced approximately 2 cm. The right femoral head remains located in the right acetabulum.  Proximal left femur appears intact. There is slight joint space narrowing of the hips bilaterally. Pubic bones appear intact. Sacroiliac joints and pubic symphysis are unremarkable. Surgical suture and clips are noted in the right lower quadrant. Fairly prominent amount of stool in the rectum. Visualized bowel gas pattern nonobstructive.  In the frontal projection, the height of the L3 vertebral body appears decreased, for which a compression fracture, age-indeterminate cannot be excluded. No prior images are available of the lumbar spine.  IMPRESSION: 1. Acute, impacted  right femoral neck fracture. The distal fracture fragment is anteriorly displaced approximately 2 cm on the cross-table lateral view. 2. Decreased bony mineralization. 3. Cannot exclude an L3 compression fracture. No prior images of the lumbar spine available. 4. Prominent amount of stool in the rectum.   Electronically Signed   By: Curlene Dolphin M.D.   On: 12/14/2013 15:38    Microbiology: Recent Results (from the past 240 hour(s))  Culture, Urine     Status: None   Collection Time: 12/14/13 11:05 PM  Result Value Ref Range Status   Specimen Description URINE, CATHETERIZED  Final   Special Requests NONE  Final   Culture  Setup Time   Final    12/15/2013 06:19 Performed at [place] Performed at [facility]   Final   Culture NO GROWTH Performed at [facility]   Final   Report Status 12/16/2013 FINAL  Final  Surgical pcr screen     Status: None   Collection Time: 12/15/13  3:41 PM  Result Value Ref Range Status   MRSA, PCR NEGATIVE NEGATIVE Final   Staphylococcus aureus NEGATIVE NEGATIVE Final    Comment:        The Xpert SA Assay (FDA approved for NASAL specimens in patients over 40 years of age), is one component of a comprehensive surveillance program.  Test performance has been validated by [employer] for patients greater than or equal to 38 year old. It is not intended to diagnose infection nor to guide or monitor treatment.      Labs: Basic Metabolic Panel:  Recent Labs Lab 12/14/13 1556 12/14/13 1953 12/15/13 1200 12/16/13 0645  NA 142  --   --  145  K 4.1  --   --  3.9  CL 106  --   --  109  CO2 23  --   --  23  GLUCOSE 111*  --   --  102*  BUN 32*  --   --  20  CREATININE 0.84 0.79  --  0.64  CALCIUM 9.7  --   --  8.8  MG  --   --  2.1  --    Liver Function Tests:  Recent Labs Lab 12/16/13 0645  AST 16  ALT 10  ALKPHOS 49  BILITOT 0.7  PROT 5.6*  ALBUMIN 2.6*   No results for  input(s): LIPASE, AMYLASE in the last 168 hours. No results for input(s): AMMONIA in the last 168 hours. CBC:  Recent Labs Lab 12/14/13 1556 12/14/13 1953 12/16/13 0645  WBC 12.4* 11.2* 9.9  NEUTROABS 9.5*  --  7.6  HGB 11.7* 11.7* 9.0*  HCT 35.6* 35.6* 28.5*  MCV 89.9 88.8 89.9  PLT 239 234 202   Cardiac Enzymes: No results for input(s): CKTOTAL, CKMB, CKMBINDEX, TROPONINI in the last 168 hours. BNP: BNP (last 3 results) No results for input(s): PROBNP in the last 8760 hours. CBG: No results for input(s): GLUCAP in the last 168 hours.     Signed:  Gila Lauf A  [facility] 12/16/2013, 12:06 PM

## 2013-12-16 NOTE — Progress Notes (Signed)
PROGRESS NOTE    [name]:[id number] DOB: 29-Sep-1922 DOA: 12/14/2013 PCP: Hollace Kinnier, DO    Subjective: Pleasantly confused. Denies pain, SOB or CP. As per nursing. No acute events.   HPI/Brief narrative 80 year old female patient, SNF resident, with history of advanced Alzheimer's dementia, essential hypertension, CAD, anemia, recurrent falls, HLD, osteoporosis, presented with fall leading to right hip fracture. No reported LOC per staff. Patient also on ertapenem for ESBL UTI 11/20-11/30. Dr. Sharol Given discussed with patient's son and plans are for right hip hemiarthroplasty 11/24.   Assessment/Plan:  1. Right femoral neck fracture, sustained status post fall at SNF: Orthopedics consultation appreciated and plans for a right hip hemiarthroplasty 11/24. No reported cardiorespiratory symptoms. EKG without acute findings. 2-D echo 09/10/12: LVEF 55-60 percent. Based on available data, patient is at low risk for perioperative CV event and may proceed with needed surgery without any further cardiac workup. Continue beta blockers perioperatively. 2. ESBL UTI: Patient was on ertapenem PTA-continue same to be completed on 11/30. 3. Advanced Alzheimer's dementia: Mental status likely at baseline. No agitation. Patient on Depakote which may be for mood stabilization rather than seizures. 4. Essential hypertension: fluctuating but reasonable inpatient control. Continue amlodipine and beta blockers. 5. Anemia: Stable. Follow CBC postoperatively. 6.  History of CAD: No reported chest pain. 7.  DO NOT RESUSCITATE 8. History of dysphagia: Last diet was dysphagia 2 with thin liquids, SLP to reevaluate. 9. History of recurrent falls. 10. ? L3 compression fracture: Seen on x-ray. No reported back pain. Consider further evaluation if symptomatic. 11. Constipation: Significant stool seen on x-ray. Bowel regimen postoperatively. 12. Prolonged QTC: Potassium 4.1. Check magnesium and aim to keep >2.  Repeat EKG. Avoid medications that prolong QTC. Continue monitoring on telemetry.   Code Status: DO NOT RESUSCITATE  Family Communication: none at bedside Disposition Plan:return to SNF when medically stable.   Consultants:  Orthopedics  Procedures:  None  Antibiotics:  Ertapenum    Objective: Filed Vitals:   12/15/13 2055 12/16/13 0056 12/16/13 0500 12/16/13 0600  BP: 180/50 133/42  129/41  Pulse: 84 90  91  Temp: 98.5 F (36.9 C) 97.4 F (36.3 C)  97.8 F (36.6 C)  TempSrc:      Resp: 16 16  16   Height:      Weight:   57.7 kg (127 lb 3.3 oz)   SpO2: 92% 96%  92%    Intake/Output Summary (Last 24 hours) at 12/16/13 1203 Last data filed at 12/16/13 0800  Gross per 24 hour  Intake    880 ml  Output    720 ml  Net    160 ml   Filed Weights   12/14/13 2015 12/15/13 0500 12/16/13 0500  Weight: 52.8 kg (116 lb 6.5 oz) 52.8 kg (116 lb 6.5 oz) 57.7 kg (127 lb 3.3 oz)     Exam:  General exam:  elderly frail pleasantly confused female patient lying comfortably in bed. Respiratory system: Clear. No increased work of breathing. Cardiovascular system: S1 & S2 heard, RRR. No JVD, murmurs, gallops, clicks or pedal edema. Telemetry: Sinus rhythm. Gastrointestinal system: Abdomen is nondistended, soft and nontender. Normal bowel sounds heard. Central nervous system: Alert and oriented only to self . No focal neurological deficits. Extremities: Symmetric 5 x 5 power. Right lower extremity shortened and internally rotated. Right lower extremity movements limited secondary to pain.   Data Reviewed: Basic Metabolic Panel:  Recent Labs Lab 12/14/13 1556 12/14/13 1953 12/15/13 1200 12/16/13 0645  NA 142  --   --  145  K 4.1  --   --  3.9  CL 106  --   --  109  CO2 23  --   --  23  GLUCOSE 111*  --   --  102*  BUN 32*  --   --  20  CREATININE 0.84 0.79  --  0.64  CALCIUM 9.7  --   --  8.8  MG  --   --  2.1  --    Liver Function Tests:  Recent Labs Lab  12/16/13 0645  AST 16  ALT 10  ALKPHOS 49  BILITOT 0.7  PROT 5.6*  ALBUMIN 2.6*   No results for input(s): LIPASE, AMYLASE in the last 168 hours. No results for input(s): AMMONIA in the last 168 hours. CBC:  Recent Labs Lab 12/14/13 1556 12/14/13 1953 12/16/13 0645  WBC 12.4* 11.2* 9.9  NEUTROABS 9.5*  --  7.6  HGB 11.7* 11.7* 9.0*  HCT 35.6* 35.6* 28.5*  MCV 89.9 88.8 89.9  PLT 239 234 202   Cardiac Enzymes: No results for input(s): CKTOTAL, CKMB, CKMBINDEX, TROPONINI in the last 168 hours. BNP (last 3 results) No results for input(s): PROBNP in the last 8760 hours. CBG: No results for input(s): GLUCAP in the last 168 hours.  Recent Results (from the past 240 hour(s))  Culture, Urine     Status: None   Collection Time: 12/14/13 11:05 PM  Result Value Ref Range Status   Specimen Description URINE, CATHETERIZED  Final   Special Requests NONE  Final   Culture  Setup Time   Final    12/15/2013 06:19 Performed at [place] Performed at [facility]   Final   Culture NO GROWTH Performed at [facility]   Final   Report Status 12/16/2013 FINAL  Final  Surgical pcr screen     Status: None   Collection Time: 12/15/13  3:41 PM  Result Value Ref Range Status   MRSA, PCR NEGATIVE NEGATIVE Final   Staphylococcus aureus NEGATIVE NEGATIVE Final    Comment:        The Xpert SA Assay (FDA approved for NASAL specimens in patients over 67 years of age), is one component of a comprehensive surveillance program.  Test performance has been validated by [employer] for patients greater than or equal to 60 year old. It is not intended to diagnose infection nor to guide or monitor treatment.        Studies: Dg Chest 2 View  12/14/2013   CLINICAL DATA:  Weakness  EXAM: CHEST  2 VIEW  COMPARISON:  09/17/2012  FINDINGS: Cardiac shadow is stable. Right basilar atelectasis is noted. No focal confluent infiltrate or  sizable effusion is seen. Compression deformity of T11 is noted and stable. No acute bony abnormality is seen.  IMPRESSION: Right basilar atelectasis.   Electronically Signed   By: Inez Catalina M.D.   On: 12/14/2013 18:25   Dg Hip Complete Right  12/14/2013   CLINICAL DATA:  Fall.  Right hip pain.  EXAM: RIGHT HIP - COMPLETE 2+ VIEW  COMPARISON:  None.  FINDINGS: The bones are diffusely osteopenic. There is an acute, impacted right femoral neck fracture. On the cross-table lateral view, the distal fracture fragment is impacted and anteriorly displaced approximately 2 cm. The right femoral head remains located in the right acetabulum.  Proximal left femur appears intact. There is slight  joint space narrowing of the hips bilaterally. Pubic bones appear intact. Sacroiliac joints and pubic symphysis are unremarkable. Surgical suture and clips are noted in the right lower quadrant. Fairly prominent amount of stool in the rectum. Visualized bowel gas pattern nonobstructive.  In the frontal projection, the height of the L3 vertebral body appears decreased, for which a compression fracture, age-indeterminate cannot be excluded. No prior images are available of the lumbar spine.  IMPRESSION: 1. Acute, impacted right femoral neck fracture. The distal fracture fragment is anteriorly displaced approximately 2 cm on the cross-table lateral view. 2. Decreased bony mineralization. 3. Cannot exclude an L3 compression fracture. No prior images of the lumbar spine available. 4. Prominent amount of stool in the rectum.   Electronically Signed   By: Curlene Dolphin M.D.   On: 12/14/2013 15:38        Scheduled Meds: . amLODipine  10 mg Oral Daily  . aspirin EC  325 mg Oral Q breakfast  . atorvastatin  5 mg Oral QHS  . calcium-vitamin D  1 tablet Oral Daily  . cholecalciferol  2,000 Units Oral Daily  . divalproex  125 mg Oral BID  . donepezil  10 mg Oral Daily  . DULoxetine  20 mg Oral Daily  . ertapenem (INVANZ) IV  1  g Intravenous Q24H  . feeding supplement (OSMOLITE 1.5 CAL)  90 mL Oral TID  . ferrous sulfate  325 mg Oral Q breakfast  . heparin  5,000 Units Subcutaneous 3 times per day  . metoprolol  25 mg Oral BID  . multivitamin with minerals  1 tablet Oral Daily  . senna-docusate  2 tablet Oral BID  . sodium chloride  3 mL Intravenous Q12H  . sucralfate  1 g Oral TID AC & HS   Continuous Infusions: . sodium chloride 75 mL/hr at 12/15/13 1116  . sodium chloride 10 mL/hr at 12/15/13 2036    Active Problems:   Fall   Hip fracture, right    Time spent: 40 minutes    Khalis Hittle A, MD, FACP, FHM. [facility] Pager [phone]  If 7PM-7AM, please contact night-coverage [email or web address] Password [email or web address] 12/16/2013, 12:03 PM    LOS: 2 days

## 2013-12-16 NOTE — Evaluation (Signed)
Physical Therapy Evaluation Patient Details Name: [name] MRN: [id number] DOB: 1922-08-09 Today's Date: 12/16/2013   History of Present Illness  80 y.o. year old female with significant past medical history of dementia, osteroporosis, CAD, HTN  presenting with fall, R hip fracture. s/p R hip hemiarthroplasty 12/15/13.    Clinical Impression  Patient is disoriented to time, place, and situation.  She is experiencing a lot of pain after her surgery, which is limiting nearly all movement in her RLE.  Performed some exercises in bed with pt, she is able to follow simple one-step commands inconsistently and requires tactile cueing to comprehend what movement is being asked of her.  Patient would benefit from skilled therapy to address deficits in strength, ROM, and functional mobility in order to maximize independence and decrease burden of care.      Follow Up Recommendations SNF    Equipment Recommendations  None recommended by PT    Recommendations for Other Services OT consult     Precautions / Restrictions Precautions Precautions: Posterior Hip Precaution Booklet Issued: No Precaution Comments: transfers only Restrictions Weight Bearing Restrictions: Yes RLE Weight Bearing: Non weight bearing      Mobility  Bed Mobility Overal bed mobility: +2 for physical assistance;Needs Assistance Bed Mobility: Supine to Sit     Supine to sit: Max assist;+2 for physical assistance;HOB elevated     General bed mobility comments: Pt requires +2 assistance for moving legs over EOB and trunk support when moving supine to sit, moves slowly and incrementally  Transfers Overall transfer level: Needs assistance Equipment used: Rolling walker (2 wheeled) Transfers: Sit to/from [facility] Sit to Stand: Max assist;+2 physical assistance (x1, with RW) Stand pivot transfers: Max assist;+2 physical assistance (x3 (EOB to chair; chair to [facility]; BSC to chair).  No assistive  device)       General transfer comment: Attempted to stand from EOB and take steps toward chair (next to bed) but pt was unable to pivot and returned EOB.  Completed bed-to-chair using stand-pivot transfer; pt unable to maintain NWB status on right leg without therapist support to keep leg off ground throughout transfer.  Pt requires max assist +2 to power up from seated position, acheive upright posture once standing, and pivot.  Left knee blocked throughout.  Tactile cues required for correct hand and foot placement.  Pt can follow some simple commands to stand.  Ambulation/Gait                Stairs            Wheelchair Mobility    Modified Rankin (Stroke Patients Only)       Balance Overall balance assessment: Needs assistance Sitting-balance support: Bilateral upper extremity supported;Feet unsupported Sitting balance-Leahy Scale: Poor     Standing balance support: Bilateral upper extremity supported Standing balance-Leahy Scale: Zero                               Pertinent Vitals/Pain Pain Assessment: Faces Faces Pain Scale: Hurts whole lot Pain Intervention(s): Limited activity within patient's tolerance;Repositioned  SpO2 supine: 83% RA (brief trial off supplemental O2)    Home Living Family/patient expects to be discharged to:: Skilled nursing facility                      Prior Function Level of Independence: Needs assistance (Resides at SNF; no family available to determine baseline functioning)  Hand Dominance        Extremity/Trunk Assessment   Upper Extremity Assessment: Defer to OT evaluation           Lower Extremity Assessment: Generalized weakness;RLE deficits/detail;LLE deficits/detail RLE Deficits / Details: NWB but unable to maintain without physical assist; RLE is stiff but pt is able to initiate small degree of movment in hip abduction, hip flexion, knee flexion, plantarflexion, and  dorsiflexion; able to move through slightly more ROM with physical assistance.  Ankle movements are WFL bilaterally. LLE Deficits / Details: Pt at least 3-/5 strength for hip flexion, hip abduction and knee flexion; at least 3/5 for knee extension, plantarflexion, and dorsiflexion     Communication   Communication: Expressive difficulties  Cognition Arousal/Alertness: Lethargic Behavior During Therapy: WFL for tasks assessed/performed Overall Cognitive Status: No family/caregiver present to determine baseline cognitive functioning (Hx of dementia )       Memory: Decreased short-term memory;Decreased recall of precautions              General Comments      Exercises General Exercises - Lower Extremity Ankle Circles/Pumps: AROM;Both;20 reps;AAROM;Supine (x10 AAROM, x10 AROM) Heel Slides: AROM;10 reps;Both;Supine Hip ABduction/ADduction: AROM;Supine;10 reps;Right      Assessment/Plan    PT Assessment Patient needs continued PT services  PT Diagnosis Generalized weakness;Acute pain;Difficulty walking;Altered mental status   PT Problem List Decreased strength;Decreased range of motion;Decreased activity tolerance;Decreased balance;Decreased mobility;Decreased coordination;Decreased cognition;Decreased knowledge of use of DME;Decreased knowledge of precautions;Pain  PT Treatment Interventions Functional mobility training;Therapeutic activities;Therapeutic exercise;Gait training   PT Goals (Current goals can be found in the Care Plan section) Acute Rehab PT Goals Patient Stated Goal: none stated    Frequency Min 3X/week   Barriers to discharge        Co-evaluation               End of Session Equipment Utilized During Treatment: Gait belt;Oxygen Activity Tolerance: Patient limited by pain;Patient limited by lethargy Patient left: in chair;with call bell/phone within reach;with chair alarm set Nurse Communication: Mobility status;Weight bearing status          Time: 0116-0206 PT Time Calculation (min) (ACUTE ONLY): 50 min   Charges:   PT Evaluation $Initial PT Evaluation Tier I: 1 Procedure PT Treatments $Therapeutic Activity: 23-37 mins   PT G Codes:          Bria Portales SPT 12/16/2013, 5:31 PM

## 2013-12-16 NOTE — Progress Notes (Signed)
OT Cancellation Note  Patient Details Name: [name] MRN: [id number] DOB: 05/04/1922   Cancelled Treatment:    Reason Eval/Treat Not Completed: Other (comment) Pt is Medicare and lives at [facility]) and current D/C plan is SNF. No apparent immediate acute care OT needs, therefore will defer OT to SNF. If OT eval is needed please call Acute Rehab Dept. at [phone] or text page OT at [phone].    Villa Herb M   Cyndie Chime, OTR/L Occupational Therapist [phone] (pager)  12/16/2013, 1:14 PM

## 2013-12-17 LAB — CBC WITH DIFFERENTIAL/PLATELET
BASOS ABS: 0 10*3/uL (ref 0.0–0.1)
Basophils Relative: 0 % (ref 0–1)
Eosinophils Absolute: 0.1 10*3/uL (ref 0.0–0.7)
Eosinophils Relative: 1 % (ref 0–5)
HCT: 26.9 % — ABNORMAL LOW (ref 36.0–46.0)
Hemoglobin: 8.7 g/dL — ABNORMAL LOW (ref 12.0–15.0)
Lymphocytes Relative: 17 % (ref 12–46)
Lymphs Abs: 2 10*3/uL (ref 0.7–4.0)
MCH: 29.4 pg (ref 26.0–34.0)
MCHC: 32.3 g/dL (ref 30.0–36.0)
MCV: 90.9 fL (ref 78.0–100.0)
Monocytes Absolute: 0.8 10*3/uL (ref 0.1–1.0)
Monocytes Relative: 7 % (ref 3–12)
NEUTROS ABS: 8.8 10*3/uL — AB (ref 1.7–7.7)
Neutrophils Relative %: 75 % (ref 43–77)
Platelets: 199 10*3/uL (ref 150–400)
RBC: 2.96 MIL/uL — ABNORMAL LOW (ref 3.87–5.11)
RDW: 13.5 % (ref 11.5–15.5)
WBC: 11.7 10*3/uL — ABNORMAL HIGH (ref 4.0–10.5)

## 2013-12-17 LAB — COMPREHENSIVE METABOLIC PANEL
ALK PHOS: 60 U/L (ref 39–117)
ALT: 10 U/L (ref 0–35)
AST: 16 U/L (ref 0–37)
Albumin: 2.6 g/dL — ABNORMAL LOW (ref 3.5–5.2)
Anion gap: 12 (ref 5–15)
BILIRUBIN TOTAL: 0.6 mg/dL (ref 0.3–1.2)
BUN: 21 mg/dL (ref 6–23)
CO2: 26 mEq/L (ref 19–32)
Calcium: 8.8 mg/dL (ref 8.4–10.5)
Chloride: 108 mEq/L (ref 96–112)
Creatinine, Ser: 0.67 mg/dL (ref 0.50–1.10)
GFR calc Af Amer: 88 mL/min — ABNORMAL LOW (ref >90)
GFR calc non Af Amer: 76 mL/min — ABNORMAL LOW (ref >90)
GLUCOSE: 90 mg/dL (ref 70–99)
POTASSIUM: 3.9 meq/L (ref 3.7–5.3)
Sodium: 146 mEq/L (ref 137–147)
Total Protein: 5.8 g/dL — ABNORMAL LOW (ref 6.0–8.3)

## 2013-12-17 NOTE — Evaluation (Signed)
Clinical/Bedside Swallow Evaluation Patient Details  Name: [name] MRN: [id number] Date of Birth: 01-05-1923  Today's Date: 12/17/2013 Time: 1025-1051 SLP Time Calculation (min) (ACUTE ONLY): 26 min  Past Medical History:  Past Medical History  Diagnosis Date  . Diverticulosis of colon (without mention of hemorrhage) 06/24/2006  . Vitamin B12 deficiency   . Alzheimer's disease   . HTN (hypertension)   . Depression   . PVD (peripheral vascular disease)   . CAD (coronary artery disease)   . GERD (gastroesophageal reflux disease)   . H. pylori infection 2004  . Presbyesophagus   . Iron deficiency anemia, unspecified 01/02/2012  . Personal history of fall 01/04/2009  . Unspecified constipation   . Hemorrhage of rectum and anus   . Chest pain, unspecified   . Acute pharyngitis   . Edema   . Cataract   . HLD (hyperlipidemia)   . Osteoporosis, unspecified   . Carcinoma of cecum 1992    History of  . Malignant neoplasm of colon, unspecified site    Past Surgical History:  Past Surgical History  Procedure Laterality Date  . Gallbladder surgery    . Abdominal hysterectomy  1995    with bso  . Right colectomy    . Cataract extraction  2001  . Cholecystectomy Right   . Hip arthroplasty Right 12/15/2013    Procedure: HEMIARTHROPLASTY RIGHTHIP;  Surgeon: Newt Minion, MD;  Location: [facility];  Service: Orthopedics;  Laterality: Right;   HPI:  Pt is an 80 y.o. female with PMH of dementia, osteoporosis, CAD, and HTN presenting to hospital 11/23 with R hip fracture. Hx of dysphagia 2013, PNA 2014. During 2013 admission, recommendation was for dysphagia 2/ thin liquids. Pt had MBS in 2014, with recommendation of dysphagia 2/ thin liquids again, meds whole in puree- this was a moderate oral dysphagia with decreased coordination and oral residuals. Bedside swallow eval ordered to aide in ruling out aspiration as CXR revealed R basilar atelectasis.   Assessment / Plan /  Recommendation Clinical Impression  The pt demonstrated no overt s/s of aspiration at bedside during evaluation. Suspect a mildly delayed swallow, also noted on MBS from 2014. Attempted dysphagia 2 consistency; however, pt spat out pieces of food. RN reported that pt coughed earlier today on one sip of thin liquid but this was an isolated occurrence. Risk of aspiration at this time appears mild-moderate given cognitive status and hx of GERD/ presbyesophagus. Recommend continuing dysphagia 2 diet/ thin liquids, meds whole with puree. Provide full supervision during meals to cue pt to take small bites/ sips, allow pt to self feed, seated upright 30-60 minutes after meal. Diet advancement not recommended given hx of esophageal issues and cognitive status. ST will sign off at this time; please re-consult if needed.    Aspiration Risk  Moderate    Diet Recommendation Dysphagia 2 (Fine chop);Thin liquid   Liquid Administration via: Cup;Straw Medication Administration: Whole meds with puree Supervision: Patient able to self feed;Full supervision/cueing for compensatory strategies Compensations: Slow rate;Small sips/bites Postural Changes and/or Swallow Maneuvers: Seated upright 90 degrees;Upright 30-60 min after meal    Other  Recommendations Oral Care Recommendations: Oral care BID   Follow Up Recommendations  Skilled Nursing facility    Frequency and Duration        Pertinent Vitals/Pain n/a    SLP Swallow Goals     Swallow Study Prior Functional Status       General HPI: Pt is an 80 y.o. female  with PMH of dementia, osteoporosis, CAD, and HTN presenting to hospital 11/23 with R hip fracture. Hx of dysphagia 2013, PNA 2014. During 2013 admission, recommendation was for dysphagia 2/ thin liquids. Pt had MBS in 2014, with recommendation of dysphagia 2/ thin liquids again, meds whole in puree- this was a moderate oral dysphagia with decreased coordination and oral residuals. Bedside swallow  eval ordered to aide in ruling out aspiration as CXR revealed R basilar atelectasis. Type of Study: Bedside swallow evaluation Previous Swallow Assessment: MBS 2014- revealed moderate oral phase dysphagia without aspiration- recommended dys 2/ thin liquids Diet Prior to this Study: Dysphagia 2 (chopped);Thin liquids Temperature Spikes Noted: No Respiratory Status: Room air History of Recent Intubation: No Behavior/Cognition: Alert;Cooperative;Requires cueing Oral Cavity - Dentition: Dentures, not available Self-Feeding Abilities: Able to feed self Patient Positioning: Upright in bed Baseline Vocal Quality: Clear Volitional Cough: Strong Volitional Swallow: Unable to elicit    Oral/Motor/Sensory Function Overall Oral Motor/Sensory Function: Appears within functional limits for tasks assessed Labial ROM: Within Functional Limits Labial Symmetry: Within Functional Limits Labial Strength: Within Functional Limits Labial Sensation: Within Functional Limits Lingual Symmetry: Within Functional Limits   Ice Chips Ice chips: Not tested   Thin Liquid Thin Liquid: Impaired Presentation: Straw Pharyngeal  Phase Impairments: Suspected delayed Swallow    Nectar Thick Nectar Thick Liquid: Not tested   Honey Thick Honey Thick Liquid: Not tested   Puree Puree: Within functional limits Presentation: Spoon   Solid   GO    Solid: Not tested       Gilmore Laroche, Winna Golla K, MA, CCC-SLP 12/17/2013,10:57 AM

## 2013-12-17 NOTE — Discharge Summary (Signed)
Physician Discharge Summary  [name]:[id number] DOB: [birth year] DOA: 12/14/2013  PCP: Hollace Kinnier, DO  Admit date: 12/14/2013 Discharge date: 12/17/2013  Time spent: 40 minutes  Recommendations for Outpatient Follow-up:  1. Follow-up with primary care physician in one week. 2. Follow-up with Dr. Sharol Given within 1 week. 3. Continue Lovenox for 3-4 weeks for DVT prophylaxis.  Discharge Diagnoses:  Active Problems:   Fall   Hip fracture, right   Discharge Condition: Stable  Diet recommendation: heart healthy  Filed Weights   12/15/13 0500 12/16/13 0500 12/17/13 0500  Weight: 52.8 kg (116 lb 6.5 oz) 57.7 kg (127 lb 3.3 oz) 58.1 kg (128 lb 1.4 oz)    History of present illness:  This is a 80 y.o. year old female with significant past medical history of dementia, osteroporosis, CAD, HTN presenting with fall, R hip fracture. Level V caveat as pt is demented. Per report, pt had unwitnessed fall at SNF. No reported LOC per staff. Had xray done at nursing home that showed R hip fracture. Pt noted to be on ertapenem for ESBL UTI 11/20-11/30. No reported fevers. Per EDP, case was discussed with pt's son Ayesha Markwell, who is "open to suggestions" for care. Case discussed with Sharol Given per EDP, who feels pt needs surgical correction as well as admission to [facility]. Dr. Sharol Given is in process of discussing case with son.  On presentation hemodynamically stable, afebrile. WBC 12.4, hgb 11.7, Cr 0.84. CXR WNL apart from R basilar atelectasis. R hip xray shows Acute, impacted right femoral neck fracture. The distal fracture fragment is anteriorly displaced approximately 2 cm on the cross-table lateral view. Cannot exclude an L3 compression fracture.  Hospital Course:   1. Right femoral neck fracture, sustained status post fall at SNF: Orthopedics consultation appreciated. Right hip hemiarthroplasty done on 11/24 . No reported cardiorespiratory symptoms. EKG without acute findings. 2-D echo 09/10/12:  LVEF 55-60 percent. Based on available data, patient is at low risk for perioperative CV event and may proceed with needed surgery without any further cardiac workup. Continue beta blockers perioperatively. Continue narcotic pain medications for pain control and Lovenox for DVT prophylaxis. 2. ESBL UTI: Patient was on ertapenem PTA-continue same to be completed on 11/30. 3. Advanced Alzheimer's dementia: Mental status likely at baseline. No agitation. Patient on Depakote which may be for mood stabilization rather than seizures. 4. Essential hypertension: fluctuating but reasonable inpatient control. Continue amlodipine and beta blockers. 5. Anemia: Stable. Follow CBC postoperatively. 6. History of CAD: No reported chest pain. 7. DO NOT RESUSCITATE 8. History of dysphagia: Dysphagia 2 diet with thin liquids.  9. History of recurrent falls. 10. ? L3 compression fracture: Seen on x-ray. No reported back pain. Consider further evaluation if symptomatic. 11. Constipation: Started on MiraLAX. 12. Prolonged QTC: No symptoms, follow with EKG periodically in the nursing home.    Procedures:  Right hip hemiarthroplasty done by Dr. Sharol Given on 11/20 05/11/2013.  Consultations:  Orthopedics  Discharge Exam: Filed Vitals:   12/17/13 0622  BP: 138/30  Pulse: 88  Temp: 98.3 F (36.8 C)  Resp:    General: Alert but disoriented, not in any acute distress. HEENT: anicteric sclera, pupils reactive to light and accommodation, EOMI CVS: S1-S2 clear, no murmur rubs or gallops Chest: clear to auscultation bilaterally, no wheezing, rales or rhonchi Abdomen: soft nontender, nondistended, normal bowel sounds, no organomegaly Extremities: no cyanosis, clubbing or edema noted bilaterally Neuro: Cranial nerves II-XII intact, no focal neurological deficits  Discharge Instructions You were  cared for by a hospitalist during your hospital stay. If you have any questions about your discharge medications or the  care you received while you were in the hospital after you are discharged, you can call the unit and asked to speak with the hospitalist on call if the hospitalist that took care of you is not available. Once you are discharged, your primary care physician will handle any further medical issues. Please note that NO REFILLS for any discharge medications will be authorized once you are discharged, as it is imperative that you return to your primary care physician (or establish a relationship with a primary care physician if you do not have one) for your aftercare needs so that they can reassess your need for medications and monitor your lab values.  Discharge Instructions    Diet - low sodium heart healthy    Complete by:  As directed      Increase activity slowly    Complete by:  As directed      Non weight bearing    Complete by:  As directed   Laterality:  right  Extremity:  Lower          Current Discharge Medication List    START taking these medications   Details  !! aspirin EC 325 MG tablet Take 1 tablet (325 mg total) by mouth daily. Qty: 30 tablet, Refills: 0    enoxaparin (LOVENOX) 40 MG/0.4ML injection Inject 0.4 mLs (40 mg total) into the skin daily. Qty: 28 Syringe, Refills: 0    HYDROcodone-acetaminophen (NORCO) 5-325 MG per tablet Take 1 tablet by mouth every 6 (six) hours as needed for moderate pain. Qty: 10 tablet, Refills: 0     !! - Potential duplicate medications found. Please discuss with provider.    CONTINUE these medications which have NOT CHANGED   Details  acetaminophen (TYLENOL) 500 MG tablet Take 1,000 mg by mouth every 8 (eight) hours as needed for mild pain.    alendronate (FOSAMAX) 70 MG tablet Take 70 mg by mouth every 7 (seven) days. Takes on Mondays. Take with a full glass of water on an empty stomach.    amLODipine (NORVASC) 10 MG tablet Take 10 mg by mouth daily.    !! aspirin EC 81 MG tablet Take 81 mg by mouth daily.    atorvastatin (LIPITOR)  10 MG tablet Take 5 mg by mouth at bedtime. Half a tab.    Calcium Carbonate-Vitamin D (CALCIUM-VITAMIN D) 500-200 MG-UNIT per tablet Take 1 tablet by mouth daily.    Cholecalciferol (VITAMIN D) 2000 UNITS tablet Take 2,000 Units by mouth daily.    divalproex (DEPAKOTE SPRINKLE) 125 MG capsule Take 125 mg by mouth 2 (two) times daily.     donepezil (ARICEPT) 10 MG tablet Take 10 mg by mouth daily.     !! DULoxetine (CYMBALTA) 20 MG capsule Take 20 mg by mouth daily. At 2pm    !! DULoxetine (CYMBALTA) 60 MG capsule Take 60 mg by mouth daily. Morning    ertapenem 1 g in sodium chloride 0.9 % 50 mL Inject 1 g into the vein daily. For 10 days. Started 12-11-13 to 12-21-13    ferrous sulfate 325 (65 FE) MG tablet Take 325 mg by mouth daily with breakfast.    ipratropium-albuterol (DUONEB) 0.5-2.5 (3) MG/3ML SOLN Take 3 mLs by nebulization every 6 (six) hours as needed (shortness of breath.).    lisinopril (PRINIVIL,ZESTRIL) 10 MG tablet Take 10 mg by mouth daily.  metoprolol (LOPRESSOR) 25 MG tablet Take 1 tablet (25 mg total) by mouth 2 (two) times daily. If pulse <50 or SBP <100 hold med and notify MD. Otho Darner: 60 tablet, Refills: 0   Associated Diagnoses: Essential hypertension    Multiple Vitamin (MULTIVITAMIN WITH MINERALS) TABS tablet Take 1 tablet by mouth daily.    Nutritional Supplements (TWOCAL HN) LIQD Take 90 mLs by mouth 3 (three) times daily.     sennosides-docusate sodium (SENOKOT-S) 8.6-50 MG tablet Take 1 tablet by mouth daily.    sucralfate (CARAFATE) 1 GM/10ML suspension Take 1 g by mouth 4 (four) times daily -  before meals and at bedtime.    vitamin B-12 (CYANOCOBALAMIN) 1000 MCG tablet Take 1,000 mcg by mouth every other day.     !! - Potential duplicate medications found. Please discuss with provider.     Allergies  Allergen Reactions  . Captopril-Hydrochlorothiazide Other (See Comments)    Reaction unknown  . Niacin And Related Other (See Comments)     Reaction unknown  . Urispas [Flavoxate] Other (See Comments)    Reaction unknown  . Verapamil Other (See Comments)    Reaction unknown   Follow-up Information    Follow up with DUDA,MARCUS V, MD In 3 weeks.   Specialty:  Orthopedic Surgery   Contact information:   [place] [id number] [phone]        The results of significant diagnostics from this hospitalization (including imaging, microbiology, ancillary and laboratory) are listed below for reference.    Significant Diagnostic Studies: Dg Chest 2 View  12/14/2013   CLINICAL DATA:  Weakness  EXAM: CHEST  2 VIEW  COMPARISON:  09/17/2012  FINDINGS: Cardiac shadow is stable. Right basilar atelectasis is noted. No focal confluent infiltrate or sizable effusion is seen. Compression deformity of T11 is noted and stable. No acute bony abnormality is seen.  IMPRESSION: Right basilar atelectasis.   Electronically Signed   By: Inez Catalina M.D.   On: 12/14/2013 18:25   Dg Hip Complete Right  12/14/2013   CLINICAL DATA:  Fall.  Right hip pain.  EXAM: RIGHT HIP - COMPLETE 2+ VIEW  COMPARISON:  None.  FINDINGS: The bones are diffusely osteopenic. There is an acute, impacted right femoral neck fracture. On the cross-table lateral view, the distal fracture fragment is impacted and anteriorly displaced approximately 2 cm. The right femoral head remains located in the right acetabulum.  Proximal left femur appears intact. There is slight joint space narrowing of the hips bilaterally. Pubic bones appear intact. Sacroiliac joints and pubic symphysis are unremarkable. Surgical suture and clips are noted in the right lower quadrant. Fairly prominent amount of stool in the rectum. Visualized bowel gas pattern nonobstructive.  In the frontal projection, the height of the L3 vertebral body appears decreased, for which a compression fracture, age-indeterminate cannot be excluded. No prior images are available of the lumbar spine.   IMPRESSION: 1. Acute, impacted right femoral neck fracture. The distal fracture fragment is anteriorly displaced approximately 2 cm on the cross-table lateral view. 2. Decreased bony mineralization. 3. Cannot exclude an L3 compression fracture. No prior images of the lumbar spine available. 4. Prominent amount of stool in the rectum.   Electronically Signed   By: Curlene Dolphin M.D.   On: 12/14/2013 15:38    Microbiology: Recent Results (from the past 240 hour(s))  Culture, Urine     Status: None   Collection Time: 12/14/13 11:05 PM  Result Value Ref Range  Status   Specimen Description URINE, CATHETERIZED  Final   Special Requests NONE  Final   Culture  Setup Time   Final    12/15/2013 06:19 Performed at [place] Performed at [facility]   Final   Culture NO GROWTH Performed at [facility]   Final   Report Status 12/16/2013 FINAL  Final  Surgical pcr screen     Status: None   Collection Time: 12/15/13  3:41 PM  Result Value Ref Range Status   MRSA, PCR NEGATIVE NEGATIVE Final   Staphylococcus aureus NEGATIVE NEGATIVE Final    Comment:        The Xpert SA Assay (FDA approved for NASAL specimens in patients over 40 years of age), is one component of a comprehensive surveillance program.  Test performance has been validated by [employer] for patients greater than or equal to 63 year old. It is not intended to diagnose infection nor to guide or monitor treatment.      Labs: Basic Metabolic Panel:  Recent Labs Lab 12/14/13 1556 12/14/13 1953 12/15/13 1200 12/16/13 0645 12/17/13 0351  NA 142  --   --  145 146  K 4.1  --   --  3.9 3.9  CL 106  --   --  109 108  CO2 23  --   --  23 26  GLUCOSE 111*  --   --  102* 90  BUN 32*  --   --  20 21  CREATININE 0.84 0.79  --  0.64 0.67  CALCIUM 9.7  --   --  8.8 8.8  MG  --   --  2.1  --   --    Liver Function Tests:  Recent Labs Lab 12/16/13 0645 12/17/13 0351   AST 16 16  ALT 10 10  ALKPHOS 49 60  BILITOT 0.7 0.6  PROT 5.6* 5.8*  ALBUMIN 2.6* 2.6*   No results for input(s): LIPASE, AMYLASE in the last 168 hours. No results for input(s): AMMONIA in the last 168 hours. CBC:  Recent Labs Lab 12/14/13 1556 12/14/13 1953 12/16/13 0645 12/17/13 0351  WBC 12.4* 11.2* 9.9 11.7*  NEUTROABS 9.5*  --  7.6 8.8*  HGB 11.7* 11.7* 9.0* 8.7*  HCT 35.6* 35.6* 28.5* 26.9*  MCV 89.9 88.8 89.9 90.9  PLT 239 234 202 199   Cardiac Enzymes: No results for input(s): CKTOTAL, CKMB, CKMBINDEX, TROPONINI in the last 168 hours. BNP: BNP (last 3 results) No results for input(s): PROBNP in the last 8760 hours. CBG: No results for input(s): GLUCAP in the last 168 hours.     Signed:  Jashad Depaula A  [facility] 12/17/2013, 9:53 AM

## 2013-12-17 NOTE — Discharge Instructions (Addendum)
Keep dressing dry. May use tub chair to shower. May shower 5 days post op and change dressing post showering.  Call if there is odor or saturation of dressing or worsening pain not controlled with medications. Call if fever greater than 101.5. Use hoyer, or lifing non weight bearing on the right leg. Please follow up with an appointment with Dr.Duda  3 weeks from the time of surgery [phone]. E If swelling recurrs then elevate again. Wheel chair for longer distances.Apply ice to the surgery site as needed to relieve pain. Take aspirin 325 mg every day with food or snack Apply ice to the right hip as needed. Keep heels off bed and turn frequently to prevent decubitus.

## 2013-12-17 NOTE — Plan of Care (Signed)
Problem: Discharge Progression Outcomes Goal: Barriers To Progression Addressed/Resolved Outcome: Completed/Met Date Met:  12/17/13 Goal: Pain controlled with appropriate interventions Outcome: Completed/Met Date Met:  12/17/13 Goal: Hemodynamically stable Outcome: Completed/Met Date Met:  25/63/89 Goal: Complications resolved/controlled Outcome: Completed/Met Date Met:  12/17/13 Goal: Tolerates diet Outcome: Completed/Met Date Met:  12/17/13 Goal: CMS/Neurovascular status at or above baseline Outcome: Completed/Met Date Met:  12/17/13 Goal: Incision without S/S infection Outcome: Completed/Met Date Met:  12/17/13 Goal: Ambulates safely using assistive device Outcome: Adequate for Discharge Patient unable to follow commands and utilize assistive devices. Goal: Follows weight - bearing limitations Outcome: Adequate for Discharge Patient unable to adhere to weight bearing status. Discharging to skilled nursing facility for further care. Goal: ADLs with assistance Outcome: Not Met (add Reason) Unable to perform. Goal: Discharge plan in place and appropriate Outcome: Completed/Met Date Met:  12/17/13 Goal: Activity appropriate for discharge plan Outcome: Completed/Met Date Met:  12/17/13

## 2013-12-17 NOTE — Progress Notes (Signed)
Patient discharged via ambulance ([facility]) to [facility]. Report called to facility and they are ready to take her back today. FL2, discharge summary, prescriptions, and medical necessity form sent with patient. Florian Buff, RN

## 2013-12-17 NOTE — Progress Notes (Signed)
Patient ID: [name], female   DOB: 05-Aug-1922, 80 y.o.   MRN: [id number] Subjective: 2 Days Post-Op Procedure(s) (LRB): HEMIARTHROPLASTY RIGHTHIP (Right) Awake, confused, not knowing date, reminded of Thanksgiving, TV turned on to [employer]. Patient reports pain as mild.    Objective:   VITALS:  Temp:  [98.3 F (36.8 C)-99.9 F (37.7 C)] 98.3 F (36.8 C) (11/26 0622) Pulse Rate:  [88-90] 88 (11/26 0622) Resp:  [10-15] 12 (11/26 0400) BP: (94-146)/(30-63) 146/48 mmHg (11/26 0954) SpO2:  [94 %-99 %] 98 % (11/26 0622) Weight:  [58.1 kg (128 lb 1.4 oz)] 58.1 kg (128 lb 1.4 oz) (11/26 0500)  Neurologically intact ABD soft Neurovascular intact Sensation intact distally Intact pulses distally Dorsiflexion/Plantar flexion intact Incision: moderate drainage and urine on dressing, dressing changed.   LABS  Recent Labs  12/14/13 1556 12/14/13 1953 12/16/13 0645 12/17/13 0351  HGB 11.7* 11.7* 9.0* 8.7*  WBC 12.4* 11.2* 9.9 11.7*  PLT 239 234 202 199    Recent Labs  12/16/13 0645 12/17/13 0351  NA 145 146  K 3.9 3.9  CL 109 108  CO2 23 26  BUN 20 21  CREATININE 0.64 0.67  GLUCOSE 102* 90    Recent Labs  12/14/13 1556  INR 1.26     Assessment/Plan: 2 Days Post-Op Procedure(s) (LRB): HEMIARTHROPLASTY RIGHTHIP (Right)  Advance diet Up with therapy Discharge to SNF  NITKA,Ekdahl E 12/17/2013, 10:32 AM

## 2013-12-17 NOTE — Plan of Care (Signed)
Problem: Consults Goal: Hip/Femur Fracture Patient Education See Patient Education Module for education specifics. Outcome: Completed/Met Date Met:  12/17/13 Goal: Skin Care Protocol Initiated - if Braden Score 18 or less If consults are not indicated, leave blank or document N/A Outcome: Completed/Met Date Met:  12/17/13 Goal: Nutrition Consult-if indicated Outcome: Completed/Met Date Met:  12/17/13 Goal: Diabetes Guidelines if Diabetic/Glucose > 140 If diabetic or lab glucose is > 140 mg/dl - Initiate Diabetes/Hyperglycemia Guidelines & Document Interventions  Outcome: Not Applicable Date Met:  00/51/10  Problem: Phase I Progression Outcomes Goal: Pre op pain controlled with appropriate interventions Outcome: Completed/Met Date Met:  12/17/13 Goal: Pre op Medical MD consult, if indicated Outcome: Completed/Met Date Met:  12/17/13 Goal: Pre op labs/procedures/consults per MD order Outcome: Completed/Met Date Met:  12/17/13 Goal: Pre op Protime within normal limits Outcome: Completed/Met Date Met:  12/17/13 Goal: Pre op NPO per MD orders Outcome: Completed/Met Date Met:  12/17/13 Goal: Pre op-initial discharge plan identified Outcome: Completed/Met Date Met:  12/17/13 Goal: Pre op hemodynamically stable Outcome: Completed/Met Date Met:  12/17/13 Goal: Post op pain controlled with appropriate interventions Outcome: Completed/Met Date Met:  12/17/13 Goal: Post op hemodynamically stable Outcome: Completed/Met Date Met:  12/17/13 Goal: Post op CMS/Neurovascular status WDL Outcome: Completed/Met Date Met:  12/17/13 Goal: Post op clear liquids, advance diet as tolerated Outcome: Completed/Met Date Met:  12/17/13 Goal: Other Phase I Outcomes/Goals Outcome: Not Applicable Date Met:  21/11/73

## 2013-12-21 ENCOUNTER — Non-Acute Institutional Stay (SKILLED_NURSING_FACILITY): Payer: Medicare Other | Admitting: Adult Health

## 2013-12-21 ENCOUNTER — Encounter: Payer: Self-pay | Admitting: Adult Health

## 2013-12-21 DIAGNOSIS — S72001A Fracture of unspecified part of neck of right femur, initial encounter for closed fracture: Secondary | ICD-10-CM | POA: Insufficient documentation

## 2013-12-21 DIAGNOSIS — F0281 Dementia in other diseases classified elsewhere with behavioral disturbance: Secondary | ICD-10-CM

## 2013-12-21 DIAGNOSIS — G309 Alzheimer's disease, unspecified: Secondary | ICD-10-CM

## 2013-12-21 DIAGNOSIS — S72001S Fracture of unspecified part of neck of right femur, sequela: Secondary | ICD-10-CM

## 2013-12-21 DIAGNOSIS — E785 Hyperlipidemia, unspecified: Secondary | ICD-10-CM

## 2013-12-21 DIAGNOSIS — D509 Iron deficiency anemia, unspecified: Secondary | ICD-10-CM

## 2013-12-21 DIAGNOSIS — G301 Alzheimer's disease with late onset: Secondary | ICD-10-CM

## 2013-12-21 DIAGNOSIS — K219 Gastro-esophageal reflux disease without esophagitis: Secondary | ICD-10-CM

## 2013-12-21 DIAGNOSIS — M810 Age-related osteoporosis without current pathological fracture: Secondary | ICD-10-CM

## 2013-12-21 DIAGNOSIS — F22 Delusional disorders: Secondary | ICD-10-CM

## 2013-12-21 DIAGNOSIS — R1310 Dysphagia, unspecified: Secondary | ICD-10-CM

## 2013-12-21 DIAGNOSIS — N390 Urinary tract infection, site not specified: Secondary | ICD-10-CM

## 2013-12-21 DIAGNOSIS — I10 Essential (primary) hypertension: Secondary | ICD-10-CM

## 2013-12-21 HISTORY — DX: Fracture of unspecified part of neck of right femur, initial encounter for closed fracture: S72.001A

## 2013-12-21 NOTE — Progress Notes (Signed)
Patient ID: [name], female   DOB: 28-Dec-1922, 80 y.o.   MRN: [id number]   [facility]     Allergies  Allergen Reactions  . Captopril-Hydrochlorothiazide Other (See Comments)    Reaction unknown  . Niacin And Related Other (See Comments)    Reaction unknown  . Urispas [Flavoxate] Other (See Comments)    Reaction unknown  . Verapamil Other (See Comments)    Reaction unknown       Chief Complaint  Patient presents with  . Hospitalization Follow-up    HPI:  She is a long term resident of this facility who has been hospitalized for a right femoral neck fracture; uti for which she completes her abt today and a questionable L3 compression fraction found on x-ray. She is not complaining of any back or hip pain. She is unable to fully participate in the hpi or ros.    Past Medical History  Diagnosis Date  . Diverticulosis of colon (without mention of hemorrhage) 06/24/2006  . Vitamin B12 deficiency   . Alzheimer's disease   . HTN (hypertension)   . Depression   . PVD (peripheral vascular disease)   . CAD (coronary artery disease)   . GERD (gastroesophageal reflux disease)   . H. pylori infection 2004  . Presbyesophagus   . Iron deficiency anemia, unspecified 01/02/2012  . Personal history of fall 01/04/2009  . Unspecified constipation   . Hemorrhage of rectum and anus   . Chest pain, unspecified   . Acute pharyngitis   . Edema   . Cataract   . HLD (hyperlipidemia)   . Osteoporosis, unspecified   . Carcinoma of cecum 1992    History of  . Malignant neoplasm of colon, unspecified site     Past Surgical History  Procedure Laterality Date  . Gallbladder surgery    . Abdominal hysterectomy  1995    with bso  . Right colectomy    . Cataract extraction  2001  . Cholecystectomy Right   . Hip arthroplasty Right 12/15/2013    Procedure: HEMIARTHROPLASTY RIGHTHIP;  Surgeon: Newt Minion, MD;  Location: [name];  Service: Orthopedics;  Laterality: Right;    VITAL  SIGNS BP 129/57 mmHg  Pulse 64  Ht 5\' 1"  (1.549 m)  Wt 128 lb (58.06 kg)  BMI 24.20 kg/m2   Outpatient Encounter Prescriptions as of 12/21/2013  Medication Sig  . acetaminophen (TYLENOL) 500 MG tablet Take 1,000 mg by mouth every 8 (eight) hours as needed for mild pain.  Marland Kitchen alendronate (FOSAMAX) 70 MG tablet Take 70 mg by mouth every 7 (seven) days. Takes on Mondays. Take with a full glass of water on an empty stomach.  Marland Kitchen amLODipine (NORVASC) 10 MG tablet Take 10 mg by mouth daily.  Marland Kitchen aspirin EC 325 MG tablet Take 1 tablet (325 mg total) by mouth daily.  Marland Kitchen aspirin EC 81 MG tablet Take 81 mg by mouth daily.  Marland Kitchen atorvastatin (LIPITOR) 10 MG tablet Take 5 mg by mouth at bedtime. Half a tab.  . Calcium Carbonate-Vitamin D (CALCIUM-VITAMIN D) 500-200 MG-UNIT per tablet Take 1 tablet by mouth daily.  . Cholecalciferol (VITAMIN D) 2000 UNITS tablet Take 2,000 Units by mouth daily.  . divalproex (DEPAKOTE SPRINKLE) 125 MG capsule Take 125 mg by mouth 2 (two) times daily.   Marland Kitchen donepezil (ARICEPT) 10 MG tablet Take 10 mg by mouth daily.   . DULoxetine (CYMBALTA) 20 MG capsule Take 20 mg by mouth daily. At 2pm  . DULoxetine (  CYMBALTA) 60 MG capsule Take 60 mg by mouth daily. Morning  . enoxaparin (LOVENOX) 40 MG/0.4ML injection Inject 0.4 mLs (40 mg total) into the skin daily.  . ertapenem 1 g in sodium chloride 0.9 % 50 mL Inject 1 g into the vein daily. For 10 days. Started 12-11-13 to 12-21-13  . ferrous sulfate 325 (65 FE) MG tablet Take 325 mg by mouth daily with breakfast.  . HYDROcodone-acetaminophen (NORCO) 5-325 MG per tablet Take 1 tablet by mouth every 6 (six) hours as needed for moderate pain.  Marland Kitchen ipratropium-albuterol (DUONEB) 0.5-2.5 (3) MG/3ML SOLN Take 3 mLs by nebulization every 6 (six) hours as needed (shortness of breath.).  Marland Kitchen lisinopril (PRINIVIL,ZESTRIL) 10 MG tablet Take 10 mg by mouth daily.  . metoprolol (LOPRESSOR) 25 MG tablet Take 1 tablet (25 mg total) by mouth 2 (two) times  daily. If pulse <50 or SBP <100 hold med and notify MD.  . Multiple Vitamin (MULTIVITAMIN WITH MINERALS) TABS tablet Take 1 tablet by mouth daily.  . Nutritional Supplements (TWOCAL HN) LIQD Take 90 mLs by mouth 3 (three) times daily.   . sennosides-docusate sodium (SENOKOT-S) 8.6-50 MG tablet Take 1 tablet by mouth daily.  . sucralfate (CARAFATE) 1 GM/10ML suspension Take 1 g by mouth 4 (four) times daily -  before meals and at bedtime.  . vitamin B-12 (CYANOCOBALAMIN) 1000 MCG tablet Take 1,000 mcg by mouth every other day.     SIGNIFICANT DIAGNOSTIC EXAMS  12-14-13: right hip x-ray: 1. Acute, impacted right femoral neck fracture. The distal fracture fragment is anteriorly displaced approximately 2 cm on the cross-table lateral view.  2. Decreased bony mineralization.  3. Cannot exclude an L3 compression fracture. No prior images of the lumbar spine available.  4. Prominent amount of stool in the rectum.  12-14-13: chest x-ray: Right basilar atelectasis.     LABS REVIEWED:   12-07-13: urine culture: e-coli: zosyn 12-10-13: wbc 6.5; hgb 10.8; hct 35.4; mcv 91.7; ;plt 223; glucose 137; bun 18.2; creat 0.62; k+4.2; na++142 12-12-13: chol 180; ldl 102; trig 170; liver normal albumin 4.2 12-14-13: wbc 12.4; hgb 11.7; hct 35.6; mcv 89.9; plt 239; glucose 111; bun 32; creat 0.84; k+4.1 ;n++142; urine culture: no growth  12-17-13: wbc 11.7; hgb 8.7; hct 26.9; mcv 90.9; plt 199; glucose 90; bun 21; creat 0.7; k+3.9; na++146; liver normal albumin 2.6  12-18-13: glucose 93; bun 16.9; creat 0.53; k+3.9; na++145     Review of Systems  Unable to perform ROS    Physical Exam  Constitutional: No distress.  Frail   Neck: Neck supple. No JVD present. No thyromegaly present.  Cardiovascular: Normal rate, regular rhythm and intact distal pulses.   Respiratory: Effort normal and breath sounds normal. No respiratory distress. She has no wheezes.  GI: Soft. Bowel sounds are normal. She exhibits no  distension. There is no tenderness.  Musculoskeletal: She exhibits no edema.  Is able to move all extremities; is status post right femoral neck fracture is out of bed to geri-chair    Neurological: She is alert.  Skin: Skin is warm and dry. She is not diaphoretic.  Incision line without signs of infection present.        ASSESSMENT/ PLAN:  1. Right femoral neck fracture: will continue with therapy as directed. Will follow up with orthopedics as indicated. Will continue her lovenox 40 mg daily through 01-14-14; will continue vidocin 5/325 mg every 6 hours as needed will monitor her status.   2. Hypertension: will continue  norvasc 10 mg daily; lopressor 25 mg twice daily and lisinopril 10 mg daily will continue her asa at 325 mg daily and will monitor her status.   3. Dysphagia: no signs of aspiration present; will continue thin liquids and will monitor her status.   4. Anorexia: staff reports that her appetite is poor; will being remeron 7.5 mg nightly for 30 days to help stimulate her appetite and will monitor her status.   5. UTI: she has completed ertapenem today; will monitor   6. Anemia: will continue iron daily and will check cbc  7. Osteoporosis: she is status post right femoral neck fracture; will continue fosamax 70 mg weekly; ca++500/200 mg daily; vitamin d 2000 units daily and will monitor   8. Constipation: will continue senna s nightly   9. Dyslipidemia: will continue lipitor 5 mg daily   10. Dementia: no significant change in her status; will continue her aricept 10 mg nightly and will monitor her status.   11. Depression: is stable will continue cymbalta 60 mg in the am and 20 mg in the afternoon; will continue depakote sprinkle 125 mg twice daily and will monitor her status.     Time spent with patient 50 minutes         Ok Edwards NP [facility]  Contact [phone] Monday through Friday 8am- 5pm  After hours call [phone]

## 2013-12-23 ENCOUNTER — Other Ambulatory Visit: Payer: Self-pay | Admitting: *Deleted

## 2013-12-23 ENCOUNTER — Encounter: Payer: Self-pay | Admitting: Internal Medicine

## 2013-12-23 ENCOUNTER — Non-Acute Institutional Stay (SKILLED_NURSING_FACILITY): Payer: Medicare Other | Admitting: Internal Medicine

## 2013-12-23 DIAGNOSIS — R296 Repeated falls: Secondary | ICD-10-CM

## 2013-12-23 DIAGNOSIS — F0280 Dementia in other diseases classified elsewhere without behavioral disturbance: Secondary | ICD-10-CM

## 2013-12-23 DIAGNOSIS — G309 Alzheimer's disease, unspecified: Secondary | ICD-10-CM | POA: Diagnosis not present

## 2013-12-23 DIAGNOSIS — M810 Age-related osteoporosis without current pathological fracture: Secondary | ICD-10-CM

## 2013-12-23 DIAGNOSIS — S72001D Fracture of unspecified part of neck of right femur, subsequent encounter for closed fracture with routine healing: Secondary | ICD-10-CM | POA: Diagnosis not present

## 2013-12-23 DIAGNOSIS — Z96649 Presence of unspecified artificial hip joint: Secondary | ICD-10-CM

## 2013-12-23 DIAGNOSIS — R410 Disorientation, unspecified: Secondary | ICD-10-CM | POA: Diagnosis not present

## 2013-12-23 DIAGNOSIS — Z9660 Presence of unspecified orthopedic joint implant: Secondary | ICD-10-CM

## 2013-12-23 MED ORDER — AMBULATORY NON FORMULARY MEDICATION: Status: DC

## 2013-12-23 NOTE — Telephone Encounter (Signed)
[facility] 

## 2013-12-23 NOTE — Progress Notes (Signed)
Patient ID: [name], female   DOB: 30-Dec-1922, 80 y.o.   MRN: [id number]  Provider:  Rexene Edison. Mariea Clonts, D.O., C.M.D. Location:  [facility]  PCP: Hollace Kinnier, DO  Code Status: DNR  Allergies  Allergen Reactions  . Captopril-Hydrochlorothiazide Other (See Comments)    Reaction unknown  . Niacin And Related Other (See Comments)    Reaction unknown  . Urispas [Flavoxate] Other (See Comments)    Reaction unknown  . Verapamil Other (See Comments)    Reaction unknown    Chief Complaint  Patient presents with  . Readmit To SNF    s/p fall with hip fracture    HPI: 80 y.o. female long term care residnet with AD, dysphagia, GERD, falls, b12 deficiency seen for readmission after hospitalization with fall and right femoral neck fracture--she underwent hemiarthroplasty on 12/15/13 by Dr. Sharol Given.    Staff are concerned about her.  She is very pale and lethargic today resting in a gerichair in therapy.  They note that she did receive a dose of ativan gel last night.  I had previously discontinued all of her ativan and she had not fallen for a long time leading up to the fall that resulted in her fracture.    ROS: Review of Systems  Unable to perform ROS: mental status change  too lethargic to respond--normally will speak  Past Medical History  Diagnosis Date  . Diverticulosis of colon (without mention of hemorrhage) 06/24/2006  . Vitamin B12 deficiency   . Alzheimer's disease   . HTN (hypertension)   . Depression   . PVD (peripheral vascular disease)   . CAD (coronary artery disease)   . GERD (gastroesophageal reflux disease)   . H. pylori infection 2004  . Presbyesophagus   . Iron deficiency anemia, unspecified 01/02/2012  . Personal history of fall 01/04/2009  . Unspecified constipation   . Hemorrhage of rectum and anus   . Chest pain, unspecified   . Acute pharyngitis   . Edema   . Cataract   . HLD (hyperlipidemia)   . Osteoporosis, unspecified   .  Carcinoma of cecum 1992    History of  . Malignant neoplasm of colon, unspecified site    Past Surgical History  Procedure Laterality Date  . Gallbladder surgery    . Abdominal hysterectomy  1995    with bso  . Right colectomy    . Cataract extraction  2001  . Cholecystectomy Right   . Hip arthroplasty Right 12/15/2013    Procedure: HEMIARTHROPLASTY RIGHTHIP;  Surgeon: Newt Minion, MD;  Location: [place];  Service: Orthopedics;  Laterality: Right;   Social History:   reports that she has quit smoking. Her smoking use included Cigarettes. She smoked 0.00 packs per day. She has never used smokeless tobacco. She reports that she does not drink alcohol or use illicit drugs.  Family History  Problem Relation Age of Onset  . Heart disease Mother   . Colon cancer Neg Hx     Medications: Patient's Medications  New Prescriptions   No medications on file  Previous Medications   ACETAMINOPHEN (TYLENOL) 500 MG TABLET    Take 1,000 mg by mouth every 8 (eight) hours as needed for mild pain.   ALENDRONATE (FOSAMAX) 70 MG TABLET    Take 70 mg by mouth every 7 (seven) days. Takes on Mondays. Take with a full glass of water on an empty stomach.   AMLODIPINE (NORVASC) 10 MG TABLET  Take 10 mg by mouth daily.   ASPIRIN EC 325 MG TABLET    Take 1 tablet (325 mg total) by mouth daily.   ASPIRIN EC 81 MG TABLET    Take 81 mg by mouth daily.   ATORVASTATIN (LIPITOR) 10 MG TABLET    Take 5 mg by mouth at bedtime. Half a tab.   CALCIUM CARBONATE-VITAMIN D (CALCIUM-VITAMIN D) 500-200 MG-UNIT PER TABLET    Take 1 tablet by mouth daily.   CHOLECALCIFEROL (VITAMIN D) 2000 UNITS TABLET    Take 2,000 Units by mouth daily.   DIVALPROEX (DEPAKOTE SPRINKLE) 125 MG CAPSULE    Take 125 mg by mouth 2 (two) times daily.    DONEPEZIL (ARICEPT) 10 MG TABLET    Take 10 mg by mouth daily.    DULOXETINE (CYMBALTA) 20 MG CAPSULE    Take 20 mg by mouth daily. At 2pm   DULOXETINE (CYMBALTA) 60 MG CAPSULE    Take 60 mg by  mouth daily. Morning   ENOXAPARIN (LOVENOX) 40 MG/0.4ML INJECTION    Inject 0.4 mLs (40 mg total) into the skin daily.   ERTAPENEM 1 G IN SODIUM CHLORIDE 0.9 % 50 ML    Inject 1 g into the vein daily. For 10 days. Started 12-11-13 to 12-21-13   FERROUS SULFATE 325 (65 FE) MG TABLET    Take 325 mg by mouth daily with breakfast.   HYDROCODONE-ACETAMINOPHEN (NORCO) 5-325 MG PER TABLET    Take 1 tablet by mouth every 6 (six) hours as needed for moderate pain.   IPRATROPIUM-ALBUTEROL (DUONEB) 0.5-2.5 (3) MG/3ML SOLN    Take 3 mLs by nebulization every 6 (six) hours as needed (shortness of breath.).   LISINOPRIL (PRINIVIL,ZESTRIL) 10 MG TABLET    Take 10 mg by mouth daily.   METOPROLOL (LOPRESSOR) 25 MG TABLET    Take 1 tablet (25 mg total) by mouth 2 (two) times daily. If pulse <50 or SBP <100 hold med and notify MD.   MULTIPLE VITAMIN (MULTIVITAMIN WITH MINERALS) TABS TABLET    Take 1 tablet by mouth daily.   NUTRITIONAL SUPPLEMENTS (TWOCAL HN) LIQD    Take 90 mLs by mouth 3 (three) times daily.    SENNOSIDES-DOCUSATE SODIUM (SENOKOT-S) 8.6-50 MG TABLET    Take 1 tablet by mouth daily.   SUCRALFATE (CARAFATE) 1 GM/10ML SUSPENSION    Take 1 g by mouth 4 (four) times daily -  before meals and at bedtime.   VITAMIN B-12 (CYANOCOBALAMIN) 1000 MCG TABLET    Take 1,000 mcg by mouth every other day.  Modified Medications   No medications on file  Discontinued Medications   No medications on file     Physical Exam: Filed Vitals:   12/23/13 1227  BP: 128/70  Pulse: 76  Temp: 98.6 F (37 C)  Resp: 20  Height: 5\' 1"  (1.549 m)  Weight: 121 lb (54.885 kg)  SpO2: 97%  Physical Exam  Constitutional:  Frail, very pale white female resting in gerichair, falling asleep, difficult to keep aroused  HENT:  Head: Normocephalic and atraumatic.  Right Ear: External ear normal.  Left Ear: External ear normal.  Nose: Nose normal.  Mouth/Throat: Oropharynx is clear and moist.  Eyes: Conjunctivae are normal.  Pupils are equal, round, and reactive to light.  Neck: Normal range of motion. Neck supple. No JVD present.  Cardiovascular: Normal rate, regular rhythm, normal heart sounds and intact distal pulses.   Pulmonary/Chest: Effort normal and breath sounds normal. No respiratory distress.  Abdominal: Soft. Bowel sounds are normal. She exhibits no distension and no mass. There is no tenderness.  Musculoskeletal: She exhibits edema and tenderness.  Over left hip  Neurological:  Lethargic as above  Skin: There is pallor.  Left hip incision site w/o significant erythema, very minimal serosanguinous drainage, has ecchymoses of left hip, groin     Labs reviewed: Basic Metabolic Panel:  Recent Labs  12/14/13 1556 12/14/13 1953 12/15/13 1200 12/16/13 0645 12/17/13 0351  NA 142  --   --  145 146  K 4.1  --   --  3.9 3.9  CL 106  --   --  109 108  CO2 23  --   --  23 26  GLUCOSE 111*  --   --  102* 90  BUN 32*  --   --  20 21  CREATININE 0.84 0.79  --  0.64 0.67  CALCIUM 9.7  --   --  8.8 8.8  MG  --   --  2.1  --   --    Liver Function Tests:  Recent Labs  12/16/13 0645 12/17/13 0351  AST 16 16  ALT 10 10  ALKPHOS 49 60  BILITOT 0.7 0.6  PROT 5.6* 5.8*  ALBUMIN 2.6* 2.6*   No results for input(s): LIPASE, AMYLASE in the last 8760 hours. No results for input(s): AMMONIA in the last 8760 hours. CBC:  Recent Labs  12/14/13 1556 12/14/13 1953 12/16/13 0645 12/17/13 0351  WBC 12.4* 11.2* 9.9 11.7*  NEUTROABS 9.5*  --  7.6 8.8*  HGB 11.7* 11.7* 9.0* 8.7*  HCT 35.6* 35.6* 28.5* 26.9*  MCV 89.9 88.8 89.9 90.9  PLT 239 234 202 199  12/14/13:  Right hip xrays:  1. Acute, impacted right femoral neck fracture. The distal fracture fragment is anteriorly displaced approximately 2 cm on the cross-table lateral view. 2. Decreased bony mineralization. 3. Cannot exclude an L3 compression fracture. No prior images of the lumbar spine available. 4. Prominent amount of stool in the  rectum.  12/14/13:  CXR 2 view:  Right basilar atelectasis.  Assessment/Plan 1. Fracture of femoral neck, right, closed, with routine healing, subsequent encounter -to receive rehab but way to delirious to participate at present -cont pain mgt with hydrocodone for severe pain and tylenol for mild pain, not to exceed 3g per day -try to taper off hydrocodone as soon as tolerates due to her confusion and fall risk  2. S/P hip hemiarthroplasty -f/u with Dr. Sharol Given, should get PT, OT when alert enough to partake  3. Acute delirium -likely due to recent hip fx, pain meds, use of ativan; d/c ativan again -check cbc, bmp (may be much more anemic--appears initial labs not done as per d/c summary recs)  4. Recurrent falls -ongoing difficulty due to her advanced dementia; some of this was felt to be due to bradycardia and lopressor reduced; also had been tapered off of ativan due to its side effect--cont to avoid benzos and sedatives, maximize dementia meds with aricept, namenda  5. Alzheimer's disease -is on aricept and depakote, may need namenda and could get off depakote?  6. Senile osteoporosis -cont ca with D and additional 2000 units D; has had prior compression fxs in spine, now the hip fx; is also on fosamax--? Prior bone density results   F/u with Dr. Sharol Given re: hip fx in 1 wk Was to have cbc drawn 11/30--not in chart and filing was just done  Functional status: dependent in adls, more  lethargic now(?due to ativan)  Family/ staff Communication:  Discussed with nursing and therapy  Labs/tests ordered:  Cbc, bmp next draw

## 2014-01-12 ENCOUNTER — Encounter: Payer: Self-pay | Admitting: Internal Medicine

## 2014-01-12 NOTE — Progress Notes (Signed)
Patient ID: [name], female   DOB: 10/05/1922, 80 y.o.   MRN: [id number]  Location:  [facility] Provider:  Rexene Edison. Mariea Clonts, D.O., C.M.D.  Code Status:  DNR  Chief Complaint  Patient presents with  . Medical Management of Chronic Issues    HPI:  80 yo white female with h/o AD, senile osteoporosis, multiple falls seen for med mgt chronic diseases  Review of Systems:  Review of Systems  Constitutional: Negative for fever.  HENT: Negative for congestion.   Eyes: Negative for blurred vision.  Respiratory: Negative for shortness of breath.   Cardiovascular: Negative for chest pain.  Gastrointestinal: Negative for abdominal pain.  Genitourinary: Negative for dysuria.  Musculoskeletal: Positive for falls.  Skin: Negative for rash.  Neurological: Negative for dizziness.  Psychiatric/Behavioral: Positive for memory loss. Negative for depression.    Medications: Patient's Medications  New Prescriptions   AMBULATORY NON FORMULARY MEDICATION    Ativan 0.05% PLO Sig: Apply 0.5mg  to arms topically every 6 hours as needed for anxiety   ASPIRIN EC 325 MG TABLET    Take 1 tablet (325 mg total) by mouth daily.   ENOXAPARIN (LOVENOX) 40 MG/0.4ML INJECTION    Inject 0.4 mLs (40 mg total) into the skin daily.   HYDROCODONE-ACETAMINOPHEN (NORCO) 5-325 MG PER TABLET    Take 1 tablet by mouth every 6 (six) hours as needed for moderate pain.  Previous Medications   ACETAMINOPHEN (TYLENOL) 500 MG TABLET    Take 1,000 mg by mouth every 8 (eight) hours as needed for mild pain.   ALENDRONATE (FOSAMAX) 70 MG TABLET    Take 70 mg by mouth every 7 (seven) days. Takes on Mondays. Take with a full glass of water on an empty stomach.   AMLODIPINE (NORVASC) 10 MG TABLET    Take 10 mg by mouth daily.   ASPIRIN EC 81 MG TABLET    Take 81 mg by mouth daily.   ATORVASTATIN (LIPITOR) 10 MG TABLET    Take 5 mg by mouth at bedtime. Half a tab.   CALCIUM CARBONATE-VITAMIN D (CALCIUM-VITAMIN D)  500-200 MG-UNIT PER TABLET    Take 1 tablet by mouth daily.   CHOLECALCIFEROL (VITAMIN D) 2000 UNITS TABLET    Take 2,000 Units by mouth daily.   DIVALPROEX (DEPAKOTE SPRINKLE) 125 MG CAPSULE    Take 125 mg by mouth 2 (two) times daily.    DONEPEZIL (ARICEPT) 10 MG TABLET    Take 10 mg by mouth daily.    DULOXETINE (CYMBALTA) 20 MG CAPSULE    Take 20 mg by mouth daily. At 2pm   DULOXETINE (CYMBALTA) 60 MG CAPSULE    Take 60 mg by mouth daily. Morning   ERTAPENEM 1 G IN SODIUM CHLORIDE 0.9 % 50 ML    Inject 1 g into the vein daily. For 10 days. Started 12-11-13 to 12-21-13   FERROUS SULFATE 325 (65 FE) MG TABLET    Take 325 mg by mouth daily with breakfast.   IPRATROPIUM-ALBUTEROL (DUONEB) 0.5-2.5 (3) MG/3ML SOLN    Take 3 mLs by nebulization every 6 (six) hours as needed (shortness of breath.).   LISINOPRIL (PRINIVIL,ZESTRIL) 10 MG TABLET    Take 10 mg by mouth daily.   METOPROLOL (LOPRESSOR) 25 MG TABLET    Take 1 tablet (25 mg total) by mouth 2 (two) times daily. If pulse <50 or SBP <100 hold med and notify MD.   MULTIPLE VITAMIN (MULTIVITAMIN WITH MINERALS) TABS TABLET  Take 1 tablet by mouth daily.   NUTRITIONAL SUPPLEMENTS (TWOCAL HN) LIQD    Take 90 mLs by mouth 3 (three) times daily.    SENNOSIDES-DOCUSATE SODIUM (SENOKOT-S) 8.6-50 MG TABLET    Take 1 tablet by mouth daily.   SUCRALFATE (CARAFATE) 1 GM/10ML SUSPENSION    Take 1 g by mouth 4 (four) times daily -  before meals and at bedtime.   VITAMIN B-12 (CYANOCOBALAMIN) 1000 MCG TABLET    Take 1,000 mcg by mouth every other day.  Modified Medications   No medications on file  Discontinued Medications   ACETAMINOPHEN (TYLENOL) 650 MG CR TABLET    Take 650 mg by mouth 3 (three) times daily.   CEPHALEXIN (KEFLEX) 500 MG CAPSULE    Take 1 capsule (500 mg total) by mouth 4 (four) times daily.   DULOXETINE (CYMBALTA) 60 MG CAPSULE    Take 60 mg by mouth daily. And 20mg  po daily    Physical Exam: There were no vitals filed for this  visit.Physical Exam  Constitutional:  Frail petite white female seated in her wheelchair which is low to ground  Cardiovascular: Normal rate, regular rhythm, normal heart sounds and intact distal pulses.   Pulmonary/Chest: Effort normal and breath sounds normal. She has no rales.  Abdominal: Soft. Bowel sounds are normal. She exhibits no distension and no mass. There is no tenderness.  Musculoskeletal: Normal range of motion.  Neurological: She is alert.  Skin: Skin is warm and dry. There is pallor.  Psychiatric: She has a normal mood and affect.    Labs reviewed: Basic Metabolic Panel:  Recent Labs  12/14/13 1556 12/14/13 1953 12/15/13 1200 12/16/13 0645 12/17/13 0351  NA 142  --   --  145 146  K 4.1  --   --  3.9 3.9  CL 106  --   --  109 108  CO2 23  --   --  23 26  GLUCOSE 111*  --   --  102* 90  BUN 32*  --   --  20 21  CREATININE 0.84 0.79  --  0.64 0.67  CALCIUM 9.7  --   --  8.8 8.8  MG  --   --  2.1  --   --     Liver Function Tests:  Recent Labs  12/16/13 0645 12/17/13 0351  AST 16 16  ALT 10 10  ALKPHOS 49 60  BILITOT 0.7 0.6  PROT 5.6* 5.8*  ALBUMIN 2.6* 2.6*    CBC:  Recent Labs  12/14/13 1556 12/14/13 1953 12/16/13 0645 12/17/13 0351  WBC 12.4* 11.2* 9.9 11.7*  NEUTROABS 9.5*  --  7.6 8.8*  HGB 11.7* 11.7* 9.0* 8.7*  HCT 35.6* 35.6* 28.5* 26.9*  MCV 89.9 88.8 89.9 90.9  PLT 239 234 202 199    Significant Diagnostic Results:  Last brain imaging was 02/12/13 with previous fall where she hit her head  Assessment/Plan 1. Dementia of the Alzheimer's type, with late onset, with delusions -has been stable, continues to try to get up and walk and falls, many precautions have been taken including the mat on the floor in her room , low wheelchair, reduction of some meds  2. Recurrent falls -as above -after last fall, I reduced her metoprolol and checked some labs to see if she required any additional supplements like b12/folate  3. Anemia,  iron deficiency -cont iron  4. HTN (hypertension) -metoprolol was reduced and bp remains satisfactory at her age  80.  Senile osteoporosis -cont vitamin D, cont fosamax  6. Depression -stable  Regular diet, mechanical soft with thin liquids, no salt packets  Family/ staff Communication: discussed with nursing staff  Goals of care: long term care resident, qol, DNR

## 2014-01-18 ENCOUNTER — Non-Acute Institutional Stay (SKILLED_NURSING_FACILITY): Payer: Medicare Other | Admitting: Adult Health

## 2014-01-18 DIAGNOSIS — G309 Alzheimer's disease, unspecified: Secondary | ICD-10-CM

## 2014-01-18 DIAGNOSIS — F32A Depression, unspecified: Secondary | ICD-10-CM

## 2014-01-18 DIAGNOSIS — R1310 Dysphagia, unspecified: Secondary | ICD-10-CM

## 2014-01-18 DIAGNOSIS — G301 Alzheimer's disease with late onset: Secondary | ICD-10-CM

## 2014-01-18 DIAGNOSIS — D649 Anemia, unspecified: Secondary | ICD-10-CM

## 2014-01-18 DIAGNOSIS — F329 Major depressive disorder, single episode, unspecified: Secondary | ICD-10-CM

## 2014-01-18 DIAGNOSIS — M810 Age-related osteoporosis without current pathological fracture: Secondary | ICD-10-CM

## 2014-01-18 DIAGNOSIS — I10 Essential (primary) hypertension: Secondary | ICD-10-CM

## 2014-01-18 DIAGNOSIS — K5900 Constipation, unspecified: Secondary | ICD-10-CM

## 2014-01-18 DIAGNOSIS — S72001S Fracture of unspecified part of neck of right femur, sequela: Secondary | ICD-10-CM

## 2014-01-18 DIAGNOSIS — F0281 Dementia in other diseases classified elsewhere with behavioral disturbance: Secondary | ICD-10-CM

## 2014-01-18 DIAGNOSIS — E785 Hyperlipidemia, unspecified: Secondary | ICD-10-CM

## 2014-01-18 DIAGNOSIS — F22 Delusional disorders: Secondary | ICD-10-CM

## 2014-01-22 ENCOUNTER — Encounter: Payer: Self-pay | Admitting: Adult Health

## 2014-01-22 DIAGNOSIS — F411 Generalized anxiety disorder: Secondary | ICD-10-CM | POA: Diagnosis not present

## 2014-01-22 DIAGNOSIS — M810 Age-related osteoporosis without current pathological fracture: Secondary | ICD-10-CM | POA: Diagnosis not present

## 2014-01-22 DIAGNOSIS — I10 Essential (primary) hypertension: Secondary | ICD-10-CM | POA: Diagnosis not present

## 2014-01-22 DIAGNOSIS — F0391 Unspecified dementia with behavioral disturbance: Secondary | ICD-10-CM | POA: Diagnosis not present

## 2014-01-22 DIAGNOSIS — F39 Unspecified mood [affective] disorder: Secondary | ICD-10-CM | POA: Diagnosis not present

## 2014-01-22 DIAGNOSIS — F329 Major depressive disorder, single episode, unspecified: Secondary | ICD-10-CM | POA: Diagnosis not present

## 2014-01-22 DIAGNOSIS — K219 Gastro-esophageal reflux disease without esophagitis: Secondary | ICD-10-CM | POA: Diagnosis not present

## 2014-01-22 DIAGNOSIS — S72131D Displaced apophyseal fracture of right femur, subsequent encounter for closed fracture with routine healing: Secondary | ICD-10-CM | POA: Diagnosis not present

## 2014-01-22 DIAGNOSIS — I2510 Atherosclerotic heart disease of native coronary artery without angina pectoris: Secondary | ICD-10-CM | POA: Diagnosis not present

## 2014-01-23 DIAGNOSIS — K5900 Constipation, unspecified: Secondary | ICD-10-CM | POA: Insufficient documentation

## 2014-01-23 NOTE — Progress Notes (Signed)
Patient ID: [name], female   DOB: 1922-02-28, 81 y.o.   MRN: [id number]  [facility]     Allergies  Allergen Reactions  . Captopril-Hydrochlorothiazide Other (See Comments)    Reaction unknown  . Niacin And Related Other (See Comments)    Reaction unknown  . Urispas [Flavoxate] Other (See Comments)    Reaction unknown  . Verapamil Other (See Comments)    Reaction unknown       Chief Complaint  Patient presents with  . Medical Management of Chronic Issues    HPI:  She is a long term resident of this facility being seen for the management of her chronic illnesses. She is status post right femur fracture one month ago. She is unable to participate in the hpi or ros. There are no nursing concerns being voiced today.    Past Medical History  Diagnosis Date  . Diverticulosis of colon (without mention of hemorrhage) 06/24/2006  . Vitamin B12 deficiency   . Alzheimer's disease   . HTN (hypertension)   . Depression   . PVD (peripheral vascular disease)   . CAD (coronary artery disease)   . GERD (gastroesophageal reflux disease)   . H. pylori infection 2004  . Presbyesophagus   . Iron deficiency anemia, unspecified 01/02/2012  . Personal history of fall 01/04/2009  . Unspecified constipation   . Hemorrhage of rectum and anus   . Chest pain, unspecified   . Acute pharyngitis   . Edema   . Cataract   . HLD (hyperlipidemia)   . Osteoporosis, unspecified   . Carcinoma of cecum 1992    History of  . Malignant neoplasm of colon, unspecified site     Past Surgical History  Procedure Laterality Date  . Gallbladder surgery    . Abdominal hysterectomy  1995    with bso  . Right colectomy    . Cataract extraction  2001  . Cholecystectomy Right   . Hip arthroplasty Right 12/15/2013    Procedure: HEMIARTHROPLASTY RIGHTHIP;  Surgeon: Newt Minion, MD;  Location: [facility];  Service: Orthopedics;  Laterality: Right;    VITAL SIGNS BP 120/77 mmHg  Pulse 65  Ht 5\' 1"   (1.549 m)  Wt 119 lb (53.978 kg)  BMI 22.50 kg/m2  SpO2 97%   Outpatient Encounter Prescriptions as of 01/18/2014  Medication Sig  . acetaminophen (TYLENOL) 500 MG tablet Take 1,000 mg by mouth every 8 (eight) hours as needed for mild pain.  Marland Kitchen alendronate (FOSAMAX) 70 MG tablet Take 70 mg by mouth every 7 (seven) days. Takes on Mondays. Take with a full glass of water on an empty stomach.  . AMBULATORY NON FORMULARY MEDICATION Ativan 0.05% PLO Sig: Apply 0.5mg  to arms topically every 6 hours as needed for anxiety  . amLODipine (NORVASC) 10 MG tablet Take 10 mg by mouth daily.  Marland Kitchen aspirin EC 325 MG tablet Take 1 tablet (325 mg total) by mouth daily.  Marland Kitchen atorvastatin (LIPITOR) 10 MG tablet Take 5 mg by mouth at bedtime. Half a tab.  . Calcium Carbonate-Vitamin D (CALCIUM-VITAMIN D) 500-200 MG-UNIT per tablet Take 1 tablet by mouth daily.  . Cholecalciferol (VITAMIN D) 2000 UNITS tablet Take 2,000 Units by mouth daily.  . divalproex (DEPAKOTE SPRINKLE) 125 MG capsule Take 125 mg by mouth 2 (two) times daily.   Marland Kitchen donepezil (ARICEPT) 10 MG tablet Take 10 mg by mouth daily.   . DULoxetine (CYMBALTA) 20 MG capsule Take 20 mg by mouth daily. At 2pm  .  DULoxetine (CYMBALTA) 60 MG capsule Take 60 mg by mouth daily. Morning  . ferrous sulfate 325 (65 FE) MG tablet Take 325 mg by mouth daily with breakfast.  . HYDROcodone-acetaminophen (NORCO) 5-325 MG per tablet Take 1 tablet by mouth every 6 (six) hours as needed for moderate pain. (Patient taking differently: Take 1 tablet by mouth every 6 (six) hours as needed for moderate pain. And one tab nightly)  . ipratropium-albuterol (DUONEB) 0.5-2.5 (3) MG/3ML SOLN Take 3 mLs by nebulization every 6 (six) hours as needed (shortness of breath.).  Marland Kitchen lisinopril (PRINIVIL,ZESTRIL) 10 MG tablet Take 10 mg by mouth daily.  . metoprolol (LOPRESSOR) 25 MG tablet Take 1 tablet (25 mg total) by mouth 2 (two) times daily. If pulse <50 or SBP <100 hold med and notify MD.    . mirtazapine (REMERON) 7.5 MG tablet Take 7.5 mg by mouth at bedtime.  . Multiple Vitamin (MULTIVITAMIN WITH MINERALS) TABS tablet Take 1 tablet by mouth daily.  . Nutritional Supplements (TWOCAL HN) LIQD Take 90 mLs by mouth 3 (three) times daily.   . sennosides-docusate sodium (SENOKOT-S) 8.6-50 MG tablet Take 1 tablet by mouth daily.  . sucralfate (CARAFATE) 1 GM/10ML suspension Take 1 g by mouth 4 (four) times daily -  before meals and at bedtime.  . vitamin B-12 (CYANOCOBALAMIN) 1000 MCG tablet Take 1,000 mcg by mouth every other day.  . [DISCONTINUED] aspirin EC 81 MG tablet Take 81 mg by mouth daily.  . [DISCONTINUED] enoxaparin (LOVENOX) 40 MG/0.4ML injection Inject 0.4 mLs (40 mg total) into the skin daily. (Patient not taking: Reported on 01/23/2014)  . [DISCONTINUED] ertapenem 1 g in sodium chloride 0.9 % 50 mL Inject 1 g into the vein daily. For 10 days. Started 12-11-13 to 12-21-13     SIGNIFICANT DIAGNOSTIC EXAMS   12-14-13: right hip x-ray: 1. Acute, impacted right femoral neck fracture. The distal fracture fragment is anteriorly displaced approximately 2 cm on the cross-table lateral view.  2. Decreased bony mineralization.  3. Cannot exclude an L3 compression fracture. No prior images of the lumbar spine available.  4. Prominent amount of stool in the rectum.  12-14-13: chest x-ray: Right basilar atelectasis.     LABS REVIEWED:   12-07-13: urine culture: e-coli: zosyn 12-10-13: wbc 6.5; hgb 10.8; hct 35.4; mcv 91.7; ;plt 223; glucose 137; bun 18.2; creat 0.62; k+4.2; na++142 12-12-13: chol 180; ldl 102; trig 170; liver normal albumin 4.2 12-14-13: wbc 12.4; hgb 11.7; hct 35.6; mcv 89.9; plt 239; glucose 111; bun 32; creat 0.84; k+4.1 ;n++142; urine culture: no growth  12-17-13: wbc 11.7; hgb 8.7; hct 26.9; mcv 90.9; plt 199; glucose 90; bun 21; creat 0.7; k+3.9; na++146; liver normal albumin 2.6  12-18-13: glucose 93; bun 16.9; creat 0.53; k+3.9; na++145 12-22-13: wbc  9.0; hgb 7.8; hct 25.7; mcv 92.2 ;plt 346 12-24-13: glucose 78; bun 17.5; creat 0.55; k+4.1; na++143      Review of Systems  Unable to perform ROS    Physical Exam  Constitutional: No distress.  Frail   Neck: Neck supple. No JVD present. No thyromegaly present.  Cardiovascular: Normal rate, regular rhythm and intact distal pulses.   Respiratory: Effort normal and breath sounds normal. No respiratory distress. She has no wheezes.  GI: Soft. Bowel sounds are normal. She exhibits no distension. There is no tenderness.  Musculoskeletal: She exhibits no edema.  Is able to move all extremities; is status post right femoral neck fracture is out of bed to geri-chair  Neurological: She is alert.  Skin: Skin is warm and dry. She is not diaphoretic.  Incision line without signs of infection present.      ASSESSMENT/ PLAN:   1. Right femoral neck fracture: has completed lovenox therapy. Will continue asa 325 mg daily; will continue vicodin 5/325 mg nightly and every 6 hours as needed will monitor    2. Hypertension: will continue norvasc 10 mg daily; lopressor 25 mg twice daily and lisinopril 10 mg daily will continue her asa at 325 mg daily and will monitor her status.   3. Dysphagia: no signs of aspiration present; will continue thin liquids and will monitor her status.   4. Anorexia: her current weight is 119 pounds will complete her remeron 7.5 mg on 01-22-14; will not make changes will monitor    5. Anemia: will continue iron daily hgb is 7.8;  will check cbc  6. Osteoporosis: she is status post right femoral neck fracture; will continue fosamax 70 mg weekly; ca++500/200 mg daily; vitamin d 2000 units daily and will monitor   7. Constipation: will continue senna s nightly   8. Dyslipidemia: will continue lipitor 5 mg daily   9. Dementia: no significant change in her status; will continue her aricept 10 mg nightly and will monitor her status. May need to consider stopping this  medication if she continues to lose weight.   10. Depression: is stable will continue cymbalta 60 mg in the am and 20 mg in the afternoon; will continue depakote sprinkle 125 mg twice daily to help stabilize mood and will monitor her status.    Ok Edwards NP [facility]  Contact [phone] Monday through Friday 8am- 5pm  After hours call [phone]

## 2014-01-24 DIAGNOSIS — F411 Generalized anxiety disorder: Secondary | ICD-10-CM | POA: Diagnosis not present

## 2014-01-24 DIAGNOSIS — I2510 Atherosclerotic heart disease of native coronary artery without angina pectoris: Secondary | ICD-10-CM | POA: Diagnosis not present

## 2014-01-24 DIAGNOSIS — F0391 Unspecified dementia with behavioral disturbance: Secondary | ICD-10-CM | POA: Diagnosis not present

## 2014-01-24 DIAGNOSIS — S72131D Displaced apophyseal fracture of right femur, subsequent encounter for closed fracture with routine healing: Secondary | ICD-10-CM | POA: Diagnosis not present

## 2014-01-24 DIAGNOSIS — I10 Essential (primary) hypertension: Secondary | ICD-10-CM | POA: Diagnosis not present

## 2014-01-24 DIAGNOSIS — K219 Gastro-esophageal reflux disease without esophagitis: Secondary | ICD-10-CM | POA: Diagnosis not present

## 2014-01-24 DIAGNOSIS — M810 Age-related osteoporosis without current pathological fracture: Secondary | ICD-10-CM | POA: Diagnosis not present

## 2014-01-24 DIAGNOSIS — F39 Unspecified mood [affective] disorder: Secondary | ICD-10-CM | POA: Diagnosis not present

## 2014-01-24 DIAGNOSIS — F329 Major depressive disorder, single episode, unspecified: Secondary | ICD-10-CM | POA: Diagnosis not present

## 2014-01-25 DIAGNOSIS — S72131D Displaced apophyseal fracture of right femur, subsequent encounter for closed fracture with routine healing: Secondary | ICD-10-CM | POA: Diagnosis not present

## 2014-01-25 DIAGNOSIS — F39 Unspecified mood [affective] disorder: Secondary | ICD-10-CM | POA: Diagnosis not present

## 2014-01-25 DIAGNOSIS — I2510 Atherosclerotic heart disease of native coronary artery without angina pectoris: Secondary | ICD-10-CM | POA: Diagnosis not present

## 2014-01-25 DIAGNOSIS — K219 Gastro-esophageal reflux disease without esophagitis: Secondary | ICD-10-CM | POA: Diagnosis not present

## 2014-01-25 DIAGNOSIS — F329 Major depressive disorder, single episode, unspecified: Secondary | ICD-10-CM | POA: Diagnosis not present

## 2014-01-25 DIAGNOSIS — F0391 Unspecified dementia with behavioral disturbance: Secondary | ICD-10-CM | POA: Diagnosis not present

## 2014-01-25 DIAGNOSIS — M810 Age-related osteoporosis without current pathological fracture: Secondary | ICD-10-CM | POA: Diagnosis not present

## 2014-01-25 DIAGNOSIS — I10 Essential (primary) hypertension: Secondary | ICD-10-CM | POA: Diagnosis not present

## 2014-01-25 DIAGNOSIS — F411 Generalized anxiety disorder: Secondary | ICD-10-CM | POA: Diagnosis not present

## 2014-01-26 DIAGNOSIS — I10 Essential (primary) hypertension: Secondary | ICD-10-CM | POA: Diagnosis not present

## 2014-01-26 DIAGNOSIS — F39 Unspecified mood [affective] disorder: Secondary | ICD-10-CM | POA: Diagnosis not present

## 2014-01-26 DIAGNOSIS — M810 Age-related osteoporosis without current pathological fracture: Secondary | ICD-10-CM | POA: Diagnosis not present

## 2014-01-26 DIAGNOSIS — F329 Major depressive disorder, single episode, unspecified: Secondary | ICD-10-CM | POA: Diagnosis not present

## 2014-01-26 DIAGNOSIS — K219 Gastro-esophageal reflux disease without esophagitis: Secondary | ICD-10-CM | POA: Diagnosis not present

## 2014-01-26 DIAGNOSIS — F411 Generalized anxiety disorder: Secondary | ICD-10-CM | POA: Diagnosis not present

## 2014-01-26 DIAGNOSIS — S72131D Displaced apophyseal fracture of right femur, subsequent encounter for closed fracture with routine healing: Secondary | ICD-10-CM | POA: Diagnosis not present

## 2014-01-26 DIAGNOSIS — F0391 Unspecified dementia with behavioral disturbance: Secondary | ICD-10-CM | POA: Diagnosis not present

## 2014-01-26 DIAGNOSIS — I2510 Atherosclerotic heart disease of native coronary artery without angina pectoris: Secondary | ICD-10-CM | POA: Diagnosis not present

## 2014-01-27 DIAGNOSIS — R05 Cough: Secondary | ICD-10-CM | POA: Diagnosis not present

## 2014-01-27 DIAGNOSIS — S72131D Displaced apophyseal fracture of right femur, subsequent encounter for closed fracture with routine healing: Secondary | ICD-10-CM | POA: Diagnosis not present

## 2014-01-27 DIAGNOSIS — F0391 Unspecified dementia with behavioral disturbance: Secondary | ICD-10-CM | POA: Diagnosis not present

## 2014-01-27 DIAGNOSIS — R0989 Other specified symptoms and signs involving the circulatory and respiratory systems: Secondary | ICD-10-CM | POA: Diagnosis not present

## 2014-01-27 DIAGNOSIS — M810 Age-related osteoporosis without current pathological fracture: Secondary | ICD-10-CM | POA: Diagnosis not present

## 2014-01-27 DIAGNOSIS — K219 Gastro-esophageal reflux disease without esophagitis: Secondary | ICD-10-CM | POA: Diagnosis not present

## 2014-01-27 DIAGNOSIS — F329 Major depressive disorder, single episode, unspecified: Secondary | ICD-10-CM | POA: Diagnosis not present

## 2014-01-27 DIAGNOSIS — I10 Essential (primary) hypertension: Secondary | ICD-10-CM | POA: Diagnosis not present

## 2014-01-27 DIAGNOSIS — F39 Unspecified mood [affective] disorder: Secondary | ICD-10-CM | POA: Diagnosis not present

## 2014-01-27 DIAGNOSIS — F411 Generalized anxiety disorder: Secondary | ICD-10-CM | POA: Diagnosis not present

## 2014-01-27 DIAGNOSIS — I2510 Atherosclerotic heart disease of native coronary artery without angina pectoris: Secondary | ICD-10-CM | POA: Diagnosis not present

## 2014-01-28 DIAGNOSIS — S72131D Displaced apophyseal fracture of right femur, subsequent encounter for closed fracture with routine healing: Secondary | ICD-10-CM | POA: Diagnosis not present

## 2014-01-28 DIAGNOSIS — F39 Unspecified mood [affective] disorder: Secondary | ICD-10-CM | POA: Diagnosis not present

## 2014-01-28 DIAGNOSIS — K219 Gastro-esophageal reflux disease without esophagitis: Secondary | ICD-10-CM | POA: Diagnosis not present

## 2014-01-28 DIAGNOSIS — F411 Generalized anxiety disorder: Secondary | ICD-10-CM | POA: Diagnosis not present

## 2014-01-28 DIAGNOSIS — M810 Age-related osteoporosis without current pathological fracture: Secondary | ICD-10-CM | POA: Diagnosis not present

## 2014-01-28 DIAGNOSIS — F0391 Unspecified dementia with behavioral disturbance: Secondary | ICD-10-CM | POA: Diagnosis not present

## 2014-01-28 DIAGNOSIS — I10 Essential (primary) hypertension: Secondary | ICD-10-CM | POA: Diagnosis not present

## 2014-01-28 DIAGNOSIS — F329 Major depressive disorder, single episode, unspecified: Secondary | ICD-10-CM | POA: Diagnosis not present

## 2014-01-28 DIAGNOSIS — I2510 Atherosclerotic heart disease of native coronary artery without angina pectoris: Secondary | ICD-10-CM | POA: Diagnosis not present

## 2014-01-29 DIAGNOSIS — D649 Anemia, unspecified: Secondary | ICD-10-CM | POA: Diagnosis not present

## 2014-01-29 DIAGNOSIS — Z79899 Other long term (current) drug therapy: Secondary | ICD-10-CM | POA: Diagnosis not present

## 2014-01-29 DIAGNOSIS — J189 Pneumonia, unspecified organism: Secondary | ICD-10-CM | POA: Diagnosis not present

## 2014-01-29 DIAGNOSIS — I10 Essential (primary) hypertension: Secondary | ICD-10-CM | POA: Diagnosis not present

## 2014-01-31 DIAGNOSIS — F0391 Unspecified dementia with behavioral disturbance: Secondary | ICD-10-CM | POA: Diagnosis not present

## 2014-01-31 DIAGNOSIS — I2510 Atherosclerotic heart disease of native coronary artery without angina pectoris: Secondary | ICD-10-CM | POA: Diagnosis not present

## 2014-01-31 DIAGNOSIS — M810 Age-related osteoporosis without current pathological fracture: Secondary | ICD-10-CM | POA: Diagnosis not present

## 2014-01-31 DIAGNOSIS — K219 Gastro-esophageal reflux disease without esophagitis: Secondary | ICD-10-CM | POA: Diagnosis not present

## 2014-01-31 DIAGNOSIS — F411 Generalized anxiety disorder: Secondary | ICD-10-CM | POA: Diagnosis not present

## 2014-01-31 DIAGNOSIS — I10 Essential (primary) hypertension: Secondary | ICD-10-CM | POA: Diagnosis not present

## 2014-01-31 DIAGNOSIS — S72131D Displaced apophyseal fracture of right femur, subsequent encounter for closed fracture with routine healing: Secondary | ICD-10-CM | POA: Diagnosis not present

## 2014-01-31 DIAGNOSIS — F329 Major depressive disorder, single episode, unspecified: Secondary | ICD-10-CM | POA: Diagnosis not present

## 2014-01-31 DIAGNOSIS — F39 Unspecified mood [affective] disorder: Secondary | ICD-10-CM | POA: Diagnosis not present

## 2014-02-01 DIAGNOSIS — F411 Generalized anxiety disorder: Secondary | ICD-10-CM | POA: Diagnosis not present

## 2014-02-01 DIAGNOSIS — M810 Age-related osteoporosis without current pathological fracture: Secondary | ICD-10-CM | POA: Diagnosis not present

## 2014-02-01 DIAGNOSIS — I2510 Atherosclerotic heart disease of native coronary artery without angina pectoris: Secondary | ICD-10-CM | POA: Diagnosis not present

## 2014-02-01 DIAGNOSIS — F329 Major depressive disorder, single episode, unspecified: Secondary | ICD-10-CM | POA: Diagnosis not present

## 2014-02-01 DIAGNOSIS — K219 Gastro-esophageal reflux disease without esophagitis: Secondary | ICD-10-CM | POA: Diagnosis not present

## 2014-02-01 DIAGNOSIS — F0391 Unspecified dementia with behavioral disturbance: Secondary | ICD-10-CM | POA: Diagnosis not present

## 2014-02-01 DIAGNOSIS — S72131D Displaced apophyseal fracture of right femur, subsequent encounter for closed fracture with routine healing: Secondary | ICD-10-CM | POA: Diagnosis not present

## 2014-02-01 DIAGNOSIS — I10 Essential (primary) hypertension: Secondary | ICD-10-CM | POA: Diagnosis not present

## 2014-02-01 DIAGNOSIS — F39 Unspecified mood [affective] disorder: Secondary | ICD-10-CM | POA: Diagnosis not present

## 2014-02-02 DIAGNOSIS — K219 Gastro-esophageal reflux disease without esophagitis: Secondary | ICD-10-CM | POA: Diagnosis not present

## 2014-02-02 DIAGNOSIS — F329 Major depressive disorder, single episode, unspecified: Secondary | ICD-10-CM | POA: Diagnosis not present

## 2014-02-02 DIAGNOSIS — I10 Essential (primary) hypertension: Secondary | ICD-10-CM | POA: Diagnosis not present

## 2014-02-02 DIAGNOSIS — I2510 Atherosclerotic heart disease of native coronary artery without angina pectoris: Secondary | ICD-10-CM | POA: Diagnosis not present

## 2014-02-02 DIAGNOSIS — M810 Age-related osteoporosis without current pathological fracture: Secondary | ICD-10-CM | POA: Diagnosis not present

## 2014-02-02 DIAGNOSIS — F39 Unspecified mood [affective] disorder: Secondary | ICD-10-CM | POA: Diagnosis not present

## 2014-02-02 DIAGNOSIS — S72131D Displaced apophyseal fracture of right femur, subsequent encounter for closed fracture with routine healing: Secondary | ICD-10-CM | POA: Diagnosis not present

## 2014-02-02 DIAGNOSIS — F411 Generalized anxiety disorder: Secondary | ICD-10-CM | POA: Diagnosis not present

## 2014-02-02 DIAGNOSIS — F0391 Unspecified dementia with behavioral disturbance: Secondary | ICD-10-CM | POA: Diagnosis not present

## 2014-02-03 DIAGNOSIS — M810 Age-related osteoporosis without current pathological fracture: Secondary | ICD-10-CM | POA: Diagnosis not present

## 2014-02-03 DIAGNOSIS — I10 Essential (primary) hypertension: Secondary | ICD-10-CM | POA: Diagnosis not present

## 2014-02-03 DIAGNOSIS — F411 Generalized anxiety disorder: Secondary | ICD-10-CM | POA: Diagnosis not present

## 2014-02-03 DIAGNOSIS — F39 Unspecified mood [affective] disorder: Secondary | ICD-10-CM | POA: Diagnosis not present

## 2014-02-03 DIAGNOSIS — F329 Major depressive disorder, single episode, unspecified: Secondary | ICD-10-CM | POA: Diagnosis not present

## 2014-02-03 DIAGNOSIS — F0391 Unspecified dementia with behavioral disturbance: Secondary | ICD-10-CM | POA: Diagnosis not present

## 2014-02-03 DIAGNOSIS — I2510 Atherosclerotic heart disease of native coronary artery without angina pectoris: Secondary | ICD-10-CM | POA: Diagnosis not present

## 2014-02-03 DIAGNOSIS — K219 Gastro-esophageal reflux disease without esophagitis: Secondary | ICD-10-CM | POA: Diagnosis not present

## 2014-02-03 DIAGNOSIS — S72131D Displaced apophyseal fracture of right femur, subsequent encounter for closed fracture with routine healing: Secondary | ICD-10-CM | POA: Diagnosis not present

## 2014-02-04 DIAGNOSIS — F39 Unspecified mood [affective] disorder: Secondary | ICD-10-CM | POA: Diagnosis not present

## 2014-02-04 DIAGNOSIS — I10 Essential (primary) hypertension: Secondary | ICD-10-CM | POA: Diagnosis not present

## 2014-02-04 DIAGNOSIS — S72131D Displaced apophyseal fracture of right femur, subsequent encounter for closed fracture with routine healing: Secondary | ICD-10-CM | POA: Diagnosis not present

## 2014-02-04 DIAGNOSIS — F329 Major depressive disorder, single episode, unspecified: Secondary | ICD-10-CM | POA: Diagnosis not present

## 2014-02-04 DIAGNOSIS — M810 Age-related osteoporosis without current pathological fracture: Secondary | ICD-10-CM | POA: Diagnosis not present

## 2014-02-04 DIAGNOSIS — K219 Gastro-esophageal reflux disease without esophagitis: Secondary | ICD-10-CM | POA: Diagnosis not present

## 2014-02-04 DIAGNOSIS — F0391 Unspecified dementia with behavioral disturbance: Secondary | ICD-10-CM | POA: Diagnosis not present

## 2014-02-04 DIAGNOSIS — F411 Generalized anxiety disorder: Secondary | ICD-10-CM | POA: Diagnosis not present

## 2014-02-04 DIAGNOSIS — I2510 Atherosclerotic heart disease of native coronary artery without angina pectoris: Secondary | ICD-10-CM | POA: Diagnosis not present

## 2014-02-05 DIAGNOSIS — K219 Gastro-esophageal reflux disease without esophagitis: Secondary | ICD-10-CM | POA: Diagnosis not present

## 2014-02-05 DIAGNOSIS — F39 Unspecified mood [affective] disorder: Secondary | ICD-10-CM | POA: Diagnosis not present

## 2014-02-05 DIAGNOSIS — I10 Essential (primary) hypertension: Secondary | ICD-10-CM | POA: Diagnosis not present

## 2014-02-05 DIAGNOSIS — I2510 Atherosclerotic heart disease of native coronary artery without angina pectoris: Secondary | ICD-10-CM | POA: Diagnosis not present

## 2014-02-05 DIAGNOSIS — F329 Major depressive disorder, single episode, unspecified: Secondary | ICD-10-CM | POA: Diagnosis not present

## 2014-02-05 DIAGNOSIS — S72131D Displaced apophyseal fracture of right femur, subsequent encounter for closed fracture with routine healing: Secondary | ICD-10-CM | POA: Diagnosis not present

## 2014-02-05 DIAGNOSIS — F0391 Unspecified dementia with behavioral disturbance: Secondary | ICD-10-CM | POA: Diagnosis not present

## 2014-02-05 DIAGNOSIS — F411 Generalized anxiety disorder: Secondary | ICD-10-CM | POA: Diagnosis not present

## 2014-02-05 DIAGNOSIS — M810 Age-related osteoporosis without current pathological fracture: Secondary | ICD-10-CM | POA: Diagnosis not present

## 2014-02-07 DIAGNOSIS — F411 Generalized anxiety disorder: Secondary | ICD-10-CM | POA: Diagnosis not present

## 2014-02-07 DIAGNOSIS — I2510 Atherosclerotic heart disease of native coronary artery without angina pectoris: Secondary | ICD-10-CM | POA: Diagnosis not present

## 2014-02-07 DIAGNOSIS — F0391 Unspecified dementia with behavioral disturbance: Secondary | ICD-10-CM | POA: Diagnosis not present

## 2014-02-07 DIAGNOSIS — M810 Age-related osteoporosis without current pathological fracture: Secondary | ICD-10-CM | POA: Diagnosis not present

## 2014-02-07 DIAGNOSIS — I10 Essential (primary) hypertension: Secondary | ICD-10-CM | POA: Diagnosis not present

## 2014-02-07 DIAGNOSIS — K219 Gastro-esophageal reflux disease without esophagitis: Secondary | ICD-10-CM | POA: Diagnosis not present

## 2014-02-07 DIAGNOSIS — F329 Major depressive disorder, single episode, unspecified: Secondary | ICD-10-CM | POA: Diagnosis not present

## 2014-02-07 DIAGNOSIS — S72131D Displaced apophyseal fracture of right femur, subsequent encounter for closed fracture with routine healing: Secondary | ICD-10-CM | POA: Diagnosis not present

## 2014-02-07 DIAGNOSIS — F39 Unspecified mood [affective] disorder: Secondary | ICD-10-CM | POA: Diagnosis not present

## 2014-02-08 DIAGNOSIS — I2510 Atherosclerotic heart disease of native coronary artery without angina pectoris: Secondary | ICD-10-CM | POA: Diagnosis not present

## 2014-02-08 DIAGNOSIS — F39 Unspecified mood [affective] disorder: Secondary | ICD-10-CM | POA: Diagnosis not present

## 2014-02-08 DIAGNOSIS — F329 Major depressive disorder, single episode, unspecified: Secondary | ICD-10-CM | POA: Diagnosis not present

## 2014-02-08 DIAGNOSIS — S72131D Displaced apophyseal fracture of right femur, subsequent encounter for closed fracture with routine healing: Secondary | ICD-10-CM | POA: Diagnosis not present

## 2014-02-08 DIAGNOSIS — F0391 Unspecified dementia with behavioral disturbance: Secondary | ICD-10-CM | POA: Diagnosis not present

## 2014-02-08 DIAGNOSIS — K219 Gastro-esophageal reflux disease without esophagitis: Secondary | ICD-10-CM | POA: Diagnosis not present

## 2014-02-08 DIAGNOSIS — M810 Age-related osteoporosis without current pathological fracture: Secondary | ICD-10-CM | POA: Diagnosis not present

## 2014-02-08 DIAGNOSIS — F411 Generalized anxiety disorder: Secondary | ICD-10-CM | POA: Diagnosis not present

## 2014-02-08 DIAGNOSIS — I10 Essential (primary) hypertension: Secondary | ICD-10-CM | POA: Diagnosis not present

## 2014-02-09 DIAGNOSIS — K219 Gastro-esophageal reflux disease without esophagitis: Secondary | ICD-10-CM | POA: Diagnosis not present

## 2014-02-09 DIAGNOSIS — F0391 Unspecified dementia with behavioral disturbance: Secondary | ICD-10-CM | POA: Diagnosis not present

## 2014-02-09 DIAGNOSIS — I10 Essential (primary) hypertension: Secondary | ICD-10-CM | POA: Diagnosis not present

## 2014-02-09 DIAGNOSIS — F411 Generalized anxiety disorder: Secondary | ICD-10-CM | POA: Diagnosis not present

## 2014-02-09 DIAGNOSIS — S72131D Displaced apophyseal fracture of right femur, subsequent encounter for closed fracture with routine healing: Secondary | ICD-10-CM | POA: Diagnosis not present

## 2014-02-09 DIAGNOSIS — I2510 Atherosclerotic heart disease of native coronary artery without angina pectoris: Secondary | ICD-10-CM | POA: Diagnosis not present

## 2014-02-09 DIAGNOSIS — F39 Unspecified mood [affective] disorder: Secondary | ICD-10-CM | POA: Diagnosis not present

## 2014-02-09 DIAGNOSIS — M810 Age-related osteoporosis without current pathological fracture: Secondary | ICD-10-CM | POA: Diagnosis not present

## 2014-02-09 DIAGNOSIS — F329 Major depressive disorder, single episode, unspecified: Secondary | ICD-10-CM | POA: Diagnosis not present

## 2014-02-10 DIAGNOSIS — F329 Major depressive disorder, single episode, unspecified: Secondary | ICD-10-CM | POA: Diagnosis not present

## 2014-02-10 DIAGNOSIS — F0391 Unspecified dementia with behavioral disturbance: Secondary | ICD-10-CM | POA: Diagnosis not present

## 2014-02-10 DIAGNOSIS — M810 Age-related osteoporosis without current pathological fracture: Secondary | ICD-10-CM | POA: Diagnosis not present

## 2014-02-10 DIAGNOSIS — K219 Gastro-esophageal reflux disease without esophagitis: Secondary | ICD-10-CM | POA: Diagnosis not present

## 2014-02-10 DIAGNOSIS — I10 Essential (primary) hypertension: Secondary | ICD-10-CM | POA: Diagnosis not present

## 2014-02-10 DIAGNOSIS — F411 Generalized anxiety disorder: Secondary | ICD-10-CM | POA: Diagnosis not present

## 2014-02-10 DIAGNOSIS — S72131D Displaced apophyseal fracture of right femur, subsequent encounter for closed fracture with routine healing: Secondary | ICD-10-CM | POA: Diagnosis not present

## 2014-02-10 DIAGNOSIS — I2510 Atherosclerotic heart disease of native coronary artery without angina pectoris: Secondary | ICD-10-CM | POA: Diagnosis not present

## 2014-02-10 DIAGNOSIS — F39 Unspecified mood [affective] disorder: Secondary | ICD-10-CM | POA: Diagnosis not present

## 2014-02-11 DIAGNOSIS — S72131D Displaced apophyseal fracture of right femur, subsequent encounter for closed fracture with routine healing: Secondary | ICD-10-CM | POA: Diagnosis not present

## 2014-02-11 DIAGNOSIS — I2510 Atherosclerotic heart disease of native coronary artery without angina pectoris: Secondary | ICD-10-CM | POA: Diagnosis not present

## 2014-02-11 DIAGNOSIS — F329 Major depressive disorder, single episode, unspecified: Secondary | ICD-10-CM | POA: Diagnosis not present

## 2014-02-11 DIAGNOSIS — K219 Gastro-esophageal reflux disease without esophagitis: Secondary | ICD-10-CM | POA: Diagnosis not present

## 2014-02-11 DIAGNOSIS — I10 Essential (primary) hypertension: Secondary | ICD-10-CM | POA: Diagnosis not present

## 2014-02-11 DIAGNOSIS — M810 Age-related osteoporosis without current pathological fracture: Secondary | ICD-10-CM | POA: Diagnosis not present

## 2014-02-11 DIAGNOSIS — F0391 Unspecified dementia with behavioral disturbance: Secondary | ICD-10-CM | POA: Diagnosis not present

## 2014-02-11 DIAGNOSIS — F411 Generalized anxiety disorder: Secondary | ICD-10-CM | POA: Diagnosis not present

## 2014-02-11 DIAGNOSIS — F39 Unspecified mood [affective] disorder: Secondary | ICD-10-CM | POA: Diagnosis not present

## 2014-02-14 DIAGNOSIS — F0391 Unspecified dementia with behavioral disturbance: Secondary | ICD-10-CM | POA: Diagnosis not present

## 2014-02-14 DIAGNOSIS — F329 Major depressive disorder, single episode, unspecified: Secondary | ICD-10-CM | POA: Diagnosis not present

## 2014-02-14 DIAGNOSIS — I2510 Atherosclerotic heart disease of native coronary artery without angina pectoris: Secondary | ICD-10-CM | POA: Diagnosis not present

## 2014-02-14 DIAGNOSIS — F411 Generalized anxiety disorder: Secondary | ICD-10-CM | POA: Diagnosis not present

## 2014-02-14 DIAGNOSIS — K219 Gastro-esophageal reflux disease without esophagitis: Secondary | ICD-10-CM | POA: Diagnosis not present

## 2014-02-14 DIAGNOSIS — M810 Age-related osteoporosis without current pathological fracture: Secondary | ICD-10-CM | POA: Diagnosis not present

## 2014-02-14 DIAGNOSIS — F39 Unspecified mood [affective] disorder: Secondary | ICD-10-CM | POA: Diagnosis not present

## 2014-02-14 DIAGNOSIS — S72131D Displaced apophyseal fracture of right femur, subsequent encounter for closed fracture with routine healing: Secondary | ICD-10-CM | POA: Diagnosis not present

## 2014-02-14 DIAGNOSIS — I10 Essential (primary) hypertension: Secondary | ICD-10-CM | POA: Diagnosis not present

## 2014-02-16 DIAGNOSIS — I10 Essential (primary) hypertension: Secondary | ICD-10-CM | POA: Diagnosis not present

## 2014-02-16 DIAGNOSIS — F411 Generalized anxiety disorder: Secondary | ICD-10-CM | POA: Diagnosis not present

## 2014-02-16 DIAGNOSIS — M79675 Pain in left toe(s): Secondary | ICD-10-CM | POA: Diagnosis not present

## 2014-02-16 DIAGNOSIS — B351 Tinea unguium: Secondary | ICD-10-CM | POA: Diagnosis not present

## 2014-02-16 DIAGNOSIS — M79674 Pain in right toe(s): Secondary | ICD-10-CM | POA: Diagnosis not present

## 2014-02-16 DIAGNOSIS — F329 Major depressive disorder, single episode, unspecified: Secondary | ICD-10-CM | POA: Diagnosis not present

## 2014-02-16 DIAGNOSIS — F0391 Unspecified dementia with behavioral disturbance: Secondary | ICD-10-CM | POA: Diagnosis not present

## 2014-02-16 DIAGNOSIS — F39 Unspecified mood [affective] disorder: Secondary | ICD-10-CM | POA: Diagnosis not present

## 2014-02-16 DIAGNOSIS — M810 Age-related osteoporosis without current pathological fracture: Secondary | ICD-10-CM | POA: Diagnosis not present

## 2014-02-16 DIAGNOSIS — S72131D Displaced apophyseal fracture of right femur, subsequent encounter for closed fracture with routine healing: Secondary | ICD-10-CM | POA: Diagnosis not present

## 2014-02-16 DIAGNOSIS — K219 Gastro-esophageal reflux disease without esophagitis: Secondary | ICD-10-CM | POA: Diagnosis not present

## 2014-02-16 DIAGNOSIS — I2510 Atherosclerotic heart disease of native coronary artery without angina pectoris: Secondary | ICD-10-CM | POA: Diagnosis not present

## 2014-02-17 ENCOUNTER — Non-Acute Institutional Stay (SKILLED_NURSING_FACILITY): Payer: Medicare Other | Admitting: Adult Health

## 2014-02-17 DIAGNOSIS — F0281 Dementia in other diseases classified elsewhere with behavioral disturbance: Secondary | ICD-10-CM

## 2014-02-17 DIAGNOSIS — G309 Alzheimer's disease, unspecified: Secondary | ICD-10-CM

## 2014-02-17 DIAGNOSIS — S72001S Fracture of unspecified part of neck of right femur, sequela: Secondary | ICD-10-CM | POA: Diagnosis not present

## 2014-02-17 DIAGNOSIS — F22 Delusional disorders: Secondary | ICD-10-CM

## 2014-02-17 DIAGNOSIS — D509 Iron deficiency anemia, unspecified: Secondary | ICD-10-CM | POA: Diagnosis not present

## 2014-02-17 DIAGNOSIS — G301 Alzheimer's disease with late onset: Secondary | ICD-10-CM

## 2014-02-17 DIAGNOSIS — M810 Age-related osteoporosis without current pathological fracture: Secondary | ICD-10-CM

## 2014-02-17 DIAGNOSIS — I10 Essential (primary) hypertension: Secondary | ICD-10-CM

## 2014-02-17 DIAGNOSIS — R1310 Dysphagia, unspecified: Secondary | ICD-10-CM | POA: Diagnosis not present

## 2014-02-18 DIAGNOSIS — M810 Age-related osteoporosis without current pathological fracture: Secondary | ICD-10-CM | POA: Diagnosis not present

## 2014-02-18 DIAGNOSIS — F0391 Unspecified dementia with behavioral disturbance: Secondary | ICD-10-CM | POA: Diagnosis not present

## 2014-02-18 DIAGNOSIS — K219 Gastro-esophageal reflux disease without esophagitis: Secondary | ICD-10-CM | POA: Diagnosis not present

## 2014-02-18 DIAGNOSIS — F411 Generalized anxiety disorder: Secondary | ICD-10-CM | POA: Diagnosis not present

## 2014-02-18 DIAGNOSIS — F39 Unspecified mood [affective] disorder: Secondary | ICD-10-CM | POA: Diagnosis not present

## 2014-02-18 DIAGNOSIS — I10 Essential (primary) hypertension: Secondary | ICD-10-CM | POA: Diagnosis not present

## 2014-02-18 DIAGNOSIS — S72131D Displaced apophyseal fracture of right femur, subsequent encounter for closed fracture with routine healing: Secondary | ICD-10-CM | POA: Diagnosis not present

## 2014-02-18 DIAGNOSIS — I2510 Atherosclerotic heart disease of native coronary artery without angina pectoris: Secondary | ICD-10-CM | POA: Diagnosis not present

## 2014-02-18 DIAGNOSIS — F329 Major depressive disorder, single episode, unspecified: Secondary | ICD-10-CM | POA: Diagnosis not present

## 2014-02-22 DIAGNOSIS — K219 Gastro-esophageal reflux disease without esophagitis: Secondary | ICD-10-CM | POA: Diagnosis not present

## 2014-02-22 DIAGNOSIS — I10 Essential (primary) hypertension: Secondary | ICD-10-CM | POA: Diagnosis not present

## 2014-02-22 DIAGNOSIS — F411 Generalized anxiety disorder: Secondary | ICD-10-CM | POA: Diagnosis not present

## 2014-02-22 DIAGNOSIS — S72131D Displaced apophyseal fracture of right femur, subsequent encounter for closed fracture with routine healing: Secondary | ICD-10-CM | POA: Diagnosis not present

## 2014-02-22 DIAGNOSIS — F39 Unspecified mood [affective] disorder: Secondary | ICD-10-CM | POA: Diagnosis not present

## 2014-02-22 DIAGNOSIS — M810 Age-related osteoporosis without current pathological fracture: Secondary | ICD-10-CM | POA: Diagnosis not present

## 2014-02-22 DIAGNOSIS — F0391 Unspecified dementia with behavioral disturbance: Secondary | ICD-10-CM | POA: Diagnosis not present

## 2014-02-22 DIAGNOSIS — I2510 Atherosclerotic heart disease of native coronary artery without angina pectoris: Secondary | ICD-10-CM | POA: Diagnosis not present

## 2014-02-22 DIAGNOSIS — F329 Major depressive disorder, single episode, unspecified: Secondary | ICD-10-CM | POA: Diagnosis not present

## 2014-02-24 DIAGNOSIS — F411 Generalized anxiety disorder: Secondary | ICD-10-CM | POA: Diagnosis not present

## 2014-02-24 DIAGNOSIS — F329 Major depressive disorder, single episode, unspecified: Secondary | ICD-10-CM | POA: Diagnosis not present

## 2014-02-24 DIAGNOSIS — F0391 Unspecified dementia with behavioral disturbance: Secondary | ICD-10-CM | POA: Diagnosis not present

## 2014-02-24 DIAGNOSIS — S72131D Displaced apophyseal fracture of right femur, subsequent encounter for closed fracture with routine healing: Secondary | ICD-10-CM | POA: Diagnosis not present

## 2014-02-24 DIAGNOSIS — I2510 Atherosclerotic heart disease of native coronary artery without angina pectoris: Secondary | ICD-10-CM | POA: Diagnosis not present

## 2014-02-24 DIAGNOSIS — K219 Gastro-esophageal reflux disease without esophagitis: Secondary | ICD-10-CM | POA: Diagnosis not present

## 2014-02-24 DIAGNOSIS — M810 Age-related osteoporosis without current pathological fracture: Secondary | ICD-10-CM | POA: Diagnosis not present

## 2014-02-24 DIAGNOSIS — I10 Essential (primary) hypertension: Secondary | ICD-10-CM | POA: Diagnosis not present

## 2014-02-24 DIAGNOSIS — F39 Unspecified mood [affective] disorder: Secondary | ICD-10-CM | POA: Diagnosis not present

## 2014-02-25 DIAGNOSIS — F411 Generalized anxiety disorder: Secondary | ICD-10-CM | POA: Diagnosis not present

## 2014-02-25 DIAGNOSIS — F329 Major depressive disorder, single episode, unspecified: Secondary | ICD-10-CM | POA: Diagnosis not present

## 2014-02-25 DIAGNOSIS — I2510 Atherosclerotic heart disease of native coronary artery without angina pectoris: Secondary | ICD-10-CM | POA: Diagnosis not present

## 2014-02-25 DIAGNOSIS — I10 Essential (primary) hypertension: Secondary | ICD-10-CM | POA: Diagnosis not present

## 2014-02-25 DIAGNOSIS — F39 Unspecified mood [affective] disorder: Secondary | ICD-10-CM | POA: Diagnosis not present

## 2014-02-25 DIAGNOSIS — K219 Gastro-esophageal reflux disease without esophagitis: Secondary | ICD-10-CM | POA: Diagnosis not present

## 2014-02-25 DIAGNOSIS — S72131D Displaced apophyseal fracture of right femur, subsequent encounter for closed fracture with routine healing: Secondary | ICD-10-CM | POA: Diagnosis not present

## 2014-02-25 DIAGNOSIS — M810 Age-related osteoporosis without current pathological fracture: Secondary | ICD-10-CM | POA: Diagnosis not present

## 2014-02-25 DIAGNOSIS — F0391 Unspecified dementia with behavioral disturbance: Secondary | ICD-10-CM | POA: Diagnosis not present

## 2014-03-13 DIAGNOSIS — A040 Enteropathogenic Escherichia coli infection: Secondary | ICD-10-CM | POA: Diagnosis not present

## 2014-03-13 DIAGNOSIS — Z79899 Other long term (current) drug therapy: Secondary | ICD-10-CM | POA: Diagnosis not present

## 2014-03-13 DIAGNOSIS — R509 Fever, unspecified: Secondary | ICD-10-CM | POA: Diagnosis not present

## 2014-03-13 DIAGNOSIS — D649 Anemia, unspecified: Secondary | ICD-10-CM | POA: Diagnosis not present

## 2014-03-13 DIAGNOSIS — J849 Interstitial pulmonary disease, unspecified: Secondary | ICD-10-CM | POA: Diagnosis not present

## 2014-03-17 ENCOUNTER — Non-Acute Institutional Stay (SKILLED_NURSING_FACILITY): Payer: Medicare Other | Admitting: Adult Health

## 2014-03-17 DIAGNOSIS — F32A Depression, unspecified: Secondary | ICD-10-CM

## 2014-03-17 DIAGNOSIS — F0281 Dementia in other diseases classified elsewhere with behavioral disturbance: Secondary | ICD-10-CM | POA: Diagnosis not present

## 2014-03-17 DIAGNOSIS — M810 Age-related osteoporosis without current pathological fracture: Secondary | ICD-10-CM

## 2014-03-17 DIAGNOSIS — L97419 Non-pressure chronic ulcer of right heel and midfoot with unspecified severity: Secondary | ICD-10-CM

## 2014-03-17 DIAGNOSIS — F22 Delusional disorders: Secondary | ICD-10-CM | POA: Diagnosis not present

## 2014-03-17 DIAGNOSIS — F329 Major depressive disorder, single episode, unspecified: Secondary | ICD-10-CM

## 2014-03-17 DIAGNOSIS — I10 Essential (primary) hypertension: Secondary | ICD-10-CM

## 2014-03-17 DIAGNOSIS — G301 Alzheimer's disease with late onset: Secondary | ICD-10-CM

## 2014-03-17 DIAGNOSIS — R1310 Dysphagia, unspecified: Secondary | ICD-10-CM | POA: Diagnosis not present

## 2014-03-17 DIAGNOSIS — D509 Iron deficiency anemia, unspecified: Secondary | ICD-10-CM | POA: Diagnosis not present

## 2014-03-17 DIAGNOSIS — E785 Hyperlipidemia, unspecified: Secondary | ICD-10-CM

## 2014-03-17 DIAGNOSIS — G309 Alzheimer's disease, unspecified: Secondary | ICD-10-CM | POA: Diagnosis not present

## 2014-03-17 DIAGNOSIS — L89600 Pressure ulcer of unspecified heel, unstageable: Secondary | ICD-10-CM | POA: Diagnosis not present

## 2014-03-20 DIAGNOSIS — L89600 Pressure ulcer of unspecified heel, unstageable: Secondary | ICD-10-CM | POA: Diagnosis not present

## 2014-03-24 ENCOUNTER — Encounter: Payer: Self-pay | Admitting: Adult Health

## 2014-03-24 NOTE — Progress Notes (Signed)
Patient ID: [name], female   DOB: December 11, 1922, 81 y.o.   MRN: [id number]  [facility]     Allergies  Allergen Reactions  . Captopril-Hydrochlorothiazide Other (See Comments)    Reaction unknown  . Niacin And Related Other (See Comments)    Reaction unknown  . Urispas [Flavoxate] Other (See Comments)    Reaction unknown  . Verapamil Other (See Comments)    Reaction unknown       Chief Complaint  Patient presents with  . Medical Management of Chronic Issues    HPI:  She is a long term resident of this facility being seen for the management of her chronic illnesses. Overall there is little change in her status. She is unable to participate in the hpi or ros. Her weight is stable at 120 pounds. There are no nursing concerns at this time.    Past Medical History  Diagnosis Date  . Diverticulosis of colon (without mention of hemorrhage) 06/24/2006  . Vitamin B12 deficiency   . Alzheimer's disease   . HTN (hypertension)   . Depression   . PVD (peripheral vascular disease)   . CAD (coronary artery disease)   . GERD (gastroesophageal reflux disease)   . H. pylori infection 2004  . Presbyesophagus   . Iron deficiency anemia, unspecified 01/02/2012  . Personal history of fall 01/04/2009  . Unspecified constipation   . Hemorrhage of rectum and anus   . Chest pain, unspecified   . Acute pharyngitis   . Edema   . Cataract   . HLD (hyperlipidemia)   . Osteoporosis, unspecified   . Carcinoma of cecum 1992    History of  . Malignant neoplasm of colon, unspecified site     Past Surgical History  Procedure Laterality Date  . Gallbladder surgery    . Abdominal hysterectomy  1995    with bso  . Right colectomy    . Cataract extraction  2001  . Cholecystectomy Right   . Hip arthroplasty Right 12/15/2013    Procedure: HEMIARTHROPLASTY RIGHTHIP;  Surgeon: Newt Minion, MD;  Location: [facility];  Service: Orthopedics;  Laterality: Right;    VITAL SIGNS BP 132/62 mmHg   Pulse 66  Ht 5\' 1"  (1.549 m)  Wt 120 lb (54.432 kg)  BMI 22.69 kg/m2  SpO2 96%   Outpatient Encounter Prescriptions as of 02/17/2014  Medication Sig  . acetaminophen (TYLENOL) 500 MG tablet Take 1,000 mg by mouth every 8 (eight) hours as needed for mild pain.  Marland Kitchen alendronate (FOSAMAX) 70 MG tablet Take 70 mg by mouth every 7 (seven) days. Takes on Mondays. Take with a full glass of water on an empty stomach.  . AMBULATORY NON FORMULARY MEDICATION Ativan 0.05% PLO Sig: Apply 0.5mg  to arms topically every 6 hours as needed for anxiety  . amLODipine (NORVASC) 10 MG tablet Take 10 mg by mouth daily.  Marland Kitchen aspirin EC 325 MG tablet Take 1 tablet (325 mg total) by mouth daily.  Marland Kitchen atorvastatin (LIPITOR) 10 MG tablet Take 5 mg by mouth at bedtime. Half a tab.  . Calcium Carbonate-Vitamin D (CALCIUM-VITAMIN D) 500-200 MG-UNIT per tablet Take 1 tablet by mouth daily.  . Cholecalciferol (VITAMIN D) 2000 UNITS tablet Take 2,000 Units by mouth daily.  . divalproex (DEPAKOTE SPRINKLE) 125 MG capsule Take 125 mg by mouth 2 (two) times daily.   Marland Kitchen donepezil (ARICEPT) 10 MG tablet Take 10 mg by mouth daily.   . DULoxetine (CYMBALTA) 20 MG capsule Take 20 mg  by mouth daily. At 2pm  . DULoxetine (CYMBALTA) 60 MG capsule Take 60 mg by mouth daily. Morning  . ferrous sulfate 325 (65 FE) MG tablet Take 325 mg by mouth daily with breakfast.  . HYDROcodone-acetaminophen (NORCO) 5-325 MG per tablet Take 1 tablet by mouth every 6 (six) hours as needed for moderate pain. (Patient taking differently: Take 1 tablet by mouth every 6 (six) hours as needed for moderate pain. And one tab nightly)  . ipratropium-albuterol (DUONEB) 0.5-2.5 (3) MG/3ML SOLN Take 3 mLs by nebulization every 6 (six) hours as needed (shortness of breath.).  Marland Kitchen lisinopril (PRINIVIL,ZESTRIL) 10 MG tablet Take 10 mg by mouth daily.  . metoprolol (LOPRESSOR) 25 MG tablet Take 1 tablet (25 mg total) by mouth 2 (two) times daily. If pulse <50 or SBP <100 hold  med and notify MD.  . Multiple Vitamin (MULTIVITAMIN WITH MINERALS) TABS tablet Take 1 tablet by mouth daily.  . Nutritional Supplements (TWOCAL HN) LIQD Take 90 mLs by mouth 3 (three) times daily.   . sennosides-docusate sodium (SENOKOT-S) 8.6-50 MG tablet Take 1 tablet by mouth daily.  . sucralfate (CARAFATE) 1 GM/10ML suspension Take 1 g by mouth 4 (four) times daily -  before meals and at bedtime.  . vitamin B-12 (CYANOCOBALAMIN) 1000 MCG tablet Take 1,000 mcg by mouth every other day.     SIGNIFICANT DIAGNOSTIC EXAMS   12-14-13: right hip x-ray: 1. Acute, impacted right femoral neck fracture. The distal fracture fragment is anteriorly displaced approximately 2 cm on the cross-table lateral view.  2. Decreased bony mineralization.  3. Cannot exclude an L3 compression fracture. No prior images of the lumbar spine available.  4. Prominent amount of stool in the rectum.  12-14-13: chest x-ray: Right basilar atelectasis.  01-27-14: chest x-ray: density right lung base suggestive of pneumonia possible associated effusion.   01-29-14: chest x-ray: right lower lobe plate like atelectasis in comparison no significant change. Atherosclerotic aorta. Moderate demineralization       LABS REVIEWED:   12-07-13: urine culture: e-coli: zosyn 12-10-13: wbc 6.5; hgb 10.8; hct 35.4; mcv 91.7; ;plt 223; glucose 137; bun 18.2; creat 0.62; k+4.2; na++142 12-12-13: chol 180; ldl 102; trig 170; liver normal albumin 4.2 12-14-13: wbc 12.4; hgb 11.7; hct 35.6; mcv 89.9; plt 239; glucose 111; bun 32; creat 0.84; k+4.1 ;n++142; urine culture: no growth  12-17-13: wbc 11.7; hgb 8.7; hct 26.9; mcv 90.9; plt 199; glucose 90; bun 21; creat 0.7; k+3.9; na++146; liver normal albumin 2.6  12-18-13: glucose 93; bun 16.9; creat 0.53; k+3.9; na++145 12-22-13: wbc 9.0; hgb 7.8; hct 25.7; mcv 92.2 ;plt 346 12-24-13: glucose 78; bun 17.5; creat 0.55; k+4.1; na++143  01-29-14: wbc 8.6; hgb 10.4; hct 33.9; mcv 84.4; plt 369;  glucose 98; bun 10.3; creat 0.52; k+3.9; na++144     Review of Systems  Unable to perform ROS    Physical Exam Constitutional: No distress.  Frail   Neck: Neck supple. No JVD present. No thyromegaly present.  Cardiovascular: Normal rate, regular rhythm and intact distal pulses.   Respiratory: Effort normal and breath sounds normal. No respiratory distress. She has no wheezes.  GI: Soft. Bowel sounds are normal. She exhibits no distension. There is no tenderness.  Musculoskeletal: She exhibits no edema.  Is able to move all extremities; is status post right femoral neck fracture Neurological: She is alert.  Skin: Skin is warm and dry. She is not diaphoretic.    ASSESSMENT/ PLAN:  1. Right femoral neck fracture: has  completed lovenox therapy. Will continue asa 325 mg daily; will continue vicodin 5/325 mg nightly and every 6 hours as needed will monitor    2. Hypertension: will continue norvasc 10 mg daily; lopressor 25 mg twice daily and lisinopril 10 mg daily will continue her asa at 325 mg daily and will monitor her status.   3. Dysphagia: no signs of aspiration present; will continue thin liquids and will monitor her status.   4. Anorexia: her current weight is 120 pounds is presently not taking medications; will monitor her status.   5. Anemia: will continue iron daily hgb is 10.4  6. Osteoporosis: she is status post right femoral neck fracture; will continue fosamax 70 mg weekly; ca++500/200 mg daily; vitamin d 2000 units daily and will monitor   7. Constipation: will continue senna s nightly   8. Dyslipidemia: will continue lipitor 5 mg daily   9. Dementia: no significant change in her status; will continue her aricept 10 mg nightly and will monitor her status. Her weight is presently stable .   10. Depression: is stable will continue cymbalta 60 mg in the am and 20 mg in the afternoon; will continue depakote sprinkle 125 mg twice daily to help stabilize mood and will  monitor her status.       Ok Edwards NP [facility]  Contact [phone] Monday through Friday 8am- 5pm  After hours call [phone]

## 2014-04-13 ENCOUNTER — Other Ambulatory Visit: Payer: Self-pay | Admitting: *Deleted

## 2014-04-13 MED ORDER — HYDROCODONE-ACETAMINOPHEN 5-325 MG PO TABS: ORAL_TABLET | ORAL | Status: DC

## 2014-04-13 NOTE — Telephone Encounter (Signed)
[facility] 

## 2014-04-22 ENCOUNTER — Non-Acute Institutional Stay (SKILLED_NURSING_FACILITY): Payer: Medicare Other | Admitting: Adult Health

## 2014-04-22 DIAGNOSIS — I10 Essential (primary) hypertension: Secondary | ICD-10-CM | POA: Diagnosis not present

## 2014-04-22 DIAGNOSIS — M810 Age-related osteoporosis without current pathological fracture: Secondary | ICD-10-CM

## 2014-04-22 DIAGNOSIS — E785 Hyperlipidemia, unspecified: Secondary | ICD-10-CM | POA: Diagnosis not present

## 2014-04-22 DIAGNOSIS — K5900 Constipation, unspecified: Secondary | ICD-10-CM

## 2014-04-22 DIAGNOSIS — F22 Delusional disorders: Secondary | ICD-10-CM

## 2014-04-22 DIAGNOSIS — F02818 Dementia in other diseases classified elsewhere, unspecified severity, with other behavioral disturbance: Secondary | ICD-10-CM

## 2014-04-22 DIAGNOSIS — D509 Iron deficiency anemia, unspecified: Secondary | ICD-10-CM

## 2014-04-22 DIAGNOSIS — G301 Alzheimer's disease with late onset: Secondary | ICD-10-CM

## 2014-04-22 DIAGNOSIS — R1310 Dysphagia, unspecified: Secondary | ICD-10-CM | POA: Diagnosis not present

## 2014-04-22 DIAGNOSIS — L97419 Non-pressure chronic ulcer of right heel and midfoot with unspecified severity: Secondary | ICD-10-CM | POA: Diagnosis not present

## 2014-04-22 DIAGNOSIS — G309 Alzheimer's disease, unspecified: Secondary | ICD-10-CM

## 2014-04-22 DIAGNOSIS — F329 Major depressive disorder, single episode, unspecified: Secondary | ICD-10-CM | POA: Diagnosis not present

## 2014-04-22 DIAGNOSIS — F32A Depression, unspecified: Secondary | ICD-10-CM

## 2014-04-22 DIAGNOSIS — F0281 Dementia in other diseases classified elsewhere with behavioral disturbance: Secondary | ICD-10-CM | POA: Diagnosis not present

## 2014-04-27 DIAGNOSIS — L97419 Non-pressure chronic ulcer of right heel and midfoot with unspecified severity: Secondary | ICD-10-CM | POA: Insufficient documentation

## 2014-04-27 NOTE — Progress Notes (Signed)
Patient ID: [name], female   DOB: 02/29/1922, 81 y.o.   MRN: [id number]  [facility]     Allergies  Allergen Reactions  . Captopril-Hydrochlorothiazide Other (See Comments)    Reaction unknown  . Niacin And Related Other (See Comments)    Reaction unknown  . Urispas [Flavoxate] Other (See Comments)    Reaction unknown  . Verapamil Other (See Comments)    Reaction unknown       Chief Complaint  Patient presents with  . Medical Management of Chronic Issues    HPI:  She is being seen for the management of her chronic illnesses. Her right heel ulceration is being followed by the wound doctor. There is purulent drainage present; with inflammation present; she will require abt.she is also having pain present; and is unable to describe where she is hurting. More than likely the pain is coming from her heel. She is unable to fully participate in the hpi or ros.    Past Medical History  Diagnosis Date  . Diverticulosis of colon (without mention of hemorrhage) 06/24/2006  . Vitamin B12 deficiency   . Alzheimer's disease   . HTN (hypertension)   . Depression   . PVD (peripheral vascular disease)   . CAD (coronary artery disease)   . GERD (gastroesophageal reflux disease)   . H. pylori infection 2004  . Presbyesophagus   . Iron deficiency anemia, unspecified 01/02/2012  . Personal history of fall 01/04/2009  . Unspecified constipation   . Hemorrhage of rectum and anus   . Chest pain, unspecified   . Acute pharyngitis   . Edema   . Cataract   . HLD (hyperlipidemia)   . Osteoporosis, unspecified   . Carcinoma of cecum 1992    History of  . Malignant neoplasm of colon, unspecified site     Past Surgical History  Procedure Laterality Date  . Gallbladder surgery    . Abdominal hysterectomy  1995    with bso  . Right colectomy    . Cataract extraction  2001  . Cholecystectomy Right   . Hip arthroplasty Right 12/15/2013    Procedure: HEMIARTHROPLASTY RIGHTHIP;   Surgeon: Newt Minion, MD;  Location: [facility];  Service: Orthopedics;  Laterality: Right;    VITAL SIGNS BP 133/80 mmHg  Pulse 68  Ht 4\' 10"  (1.473 m)  Wt 119 lb (53.978 kg)  BMI 24.88 kg/m2  SpO2 96%   Outpatient Encounter Prescriptions as of 03/17/2014  Medication Sig  . acetaminophen (TYLENOL) 500 MG tablet Take 1,000 mg by mouth every 8 (eight) hours as needed for mild pain.  Marland Kitchen alendronate (FOSAMAX) 70 MG tablet Take 70 mg by mouth every 7 (seven) days. Takes on Mondays. Take with a full glass of water on an empty stomach.  . AMBULATORY NON FORMULARY MEDICATION Ativan 0.05% PLO Sig: Apply 0.5mg  to arms topically every 6 hours as needed for anxiety  . amLODipine (NORVASC) 10 MG tablet Take 10 mg by mouth daily.  Marland Kitchen aspirin EC 325 MG tablet Take 1 tablet (325 mg total) by mouth daily.  Marland Kitchen atorvastatin (LIPITOR) 10 MG tablet Take 5 mg by mouth at bedtime. Half a tab.  . Calcium Carbonate-Vitamin D (CALCIUM-VITAMIN D) 500-200 MG-UNIT per tablet Take 1 tablet by mouth daily.  . Cholecalciferol (VITAMIN D) 2000 UNITS tablet Take 2,000 Units by mouth daily.  . divalproex (DEPAKOTE SPRINKLE) 125 MG capsule Take 125 mg by mouth 2 (two) times daily.   Marland Kitchen donepezil (ARICEPT) 10 MG  tablet Take 10 mg by mouth daily.   . DULoxetine (CYMBALTA) 20 MG capsule Take 20 mg by mouth daily. At 2pm  . DULoxetine (CYMBALTA) 60 MG capsule Take 60 mg by mouth daily. Morning  . ferrous sulfate 325 (65 FE) MG tablet Take 325 mg by mouth daily with breakfast.  . ipratropium-albuterol (DUONEB) 0.5-2.5 (3) MG/3ML SOLN Take 3 mLs by nebulization every 6 (six) hours as needed (shortness of breath.).  Marland Kitchen lisinopril (PRINIVIL,ZESTRIL) 10 MG tablet Take 10 mg by mouth daily.  . metoprolol (LOPRESSOR) 25 MG tablet Take 1 tablet (25 mg total) by mouth 2 (two) times daily. If pulse <50 or SBP <100 hold med and notify MD.  . Multiple Vitamin (MULTIVITAMIN WITH MINERALS) TABS tablet Take 1 tablet by mouth daily.  . Nutritional  Supplements (TWOCAL HN) LIQD Take 90 mLs by mouth 3 (three) times daily.   . sennosides-docusate sodium (SENOKOT-S) 8.6-50 MG tablet Take 1 tablet by mouth daily.  . sucralfate (CARAFATE) 1 GM/10ML suspension Take 1 g by mouth 4 (four) times daily -  before meals and at bedtime.  . vitamin B-12 (CYANOCOBALAMIN) 1000 MCG tablet Take 1,000 mcg by mouth every other day.     SIGNIFICANT DIAGNOSTIC EXAMS   12-14-13: right hip x-ray: 1. Acute, impacted right femoral neck fracture. The distal fracture fragment is anteriorly displaced approximately 2 cm on the cross-table lateral view.  2. Decreased bony mineralization.  3. Cannot exclude an L3 compression fracture. No prior images of the lumbar spine available.  4. Prominent amount of stool in the rectum.  12-14-13: chest x-ray: Right basilar atelectasis.  01-27-14: chest x-ray: density right lung base suggestive of pneumonia possible associated effusion.   01-29-14: chest x-ray: right lower lobe plate like atelectasis in comparison no significant change. Atherosclerotic aorta. Moderate demineralization       LABS REVIEWED:   12-07-13: urine culture: e-coli: zosyn 12-10-13: wbc 6.5; hgb 10.8; hct 35.4; mcv 91.7; ;plt 223; glucose 137; bun 18.2; creat 0.62; k+4.2; na++142 12-12-13: chol 180; ldl 102; trig 170; liver normal albumin 4.2 12-14-13: wbc 12.4; hgb 11.7; hct 35.6; mcv 89.9; plt 239; glucose 111; bun 32; creat 0.84; k+4.1 ;n++142; urine culture: no growth  12-17-13: wbc 11.7; hgb 8.7; hct 26.9; mcv 90.9; plt 199; glucose 90; bun 21; creat 0.7; k+3.9; na++146; liver normal albumin 2.6  12-18-13: glucose 93; bun 16.9; creat 0.53; k+3.9; na++145 12-22-13: wbc 9.0; hgb 7.8; hct 25.7; mcv 92.2 ;plt 346 12-24-13: glucose 78; bun 17.5; creat 0.55; k+4.1; na++143  01-29-14: wbc 8.6; hgb 10.4; hct 33.9; mcv 84.4; plt 369; glucose 98; bun 10.3; creat 0.52; k+3.9; na++144 03-13-14: wbc 12.3; hgb 9.6; hct 31.8; mcv 82.5 ;plt 229; glucose 81; bun  35.3;creat 0.75; k+4.0; na++146; liver norm albumin 3.4 03-17-14: right heel culture: gram + Cocci      ROS Unable to perform ROS   Physical Exam Constitutional: No distress.  Frail   Neck: Neck supple. No JVD present. No thyromegaly present.  Cardiovascular: Normal rate, regular rhythm and intact distal pulses.   Respiratory: Effort normal and breath sounds normal. No respiratory distress. She has no wheezes.  GI: Soft. Bowel sounds are normal. She exhibits no distension. There is no tenderness.  Musculoskeletal: She exhibits no edema.  Is able to move all extremities; is status post right femoral neck fracture Neurological: She is alert.  Skin: Skin is warm and dry. She is not diaphoretic. Right heel ulceration: with purulent drainage and inflammation present.  ASSESSMENT/ PLAN:   1. Right heel ulceration: will begin septra ds twice daily for 2 weeks with florastor twice daily for 2 weeks will begin vicodin 5/325 mg every 6 hours routinely for pain management     2. Hypertension: will continue norvasc 10 mg daily; lopressor 25 mg twice daily and lisinopril 10 mg daily will continue her asa at 325 mg daily and will monitor her status.   3. Dysphagia: no signs of aspiration present; will continue thin liquids and will monitor her status.   4. Anorexia: her current weight is 119 pounds is presently not taking medications; will monitor her status.   5. Anemia: will continue iron daily hgb is 9.6  6. Osteoporosis: she is status post right femoral neck fracture; will continue fosamax 70 mg weekly; ca++500/200 mg daily; vitamin d 2000 units daily and will monitor   7. Constipation: will continue senna s nightly   8. Dyslipidemia: will continue lipitor 5 mg daily   9. Dementia: no significant change in her status; will continue her aricept 10 mg nightly and will monitor her status. Her weight is presently stable .   10. Depression: is stable will continue cymbalta 60 mg in  the am and 20 mg in the afternoon; will continue depakote sprinkle 125 mg twice daily to help stabilize mood and will monitor her status.      Ok Edwards NP [facility]  Contact [phone] Monday through Friday 8am- 5pm  After hours call [phone]

## 2014-05-11 ENCOUNTER — Non-Acute Institutional Stay (SKILLED_NURSING_FACILITY): Payer: Medicare Other | Admitting: Adult Health

## 2014-05-11 DIAGNOSIS — L97419 Non-pressure chronic ulcer of right heel and midfoot with unspecified severity: Secondary | ICD-10-CM | POA: Diagnosis not present

## 2014-05-11 DIAGNOSIS — G301 Alzheimer's disease with late onset: Secondary | ICD-10-CM

## 2014-05-11 DIAGNOSIS — F0281 Dementia in other diseases classified elsewhere with behavioral disturbance: Secondary | ICD-10-CM | POA: Diagnosis not present

## 2014-05-11 DIAGNOSIS — H02839 Dermatochalasis of unspecified eye, unspecified eyelid: Secondary | ICD-10-CM | POA: Diagnosis not present

## 2014-05-11 DIAGNOSIS — R634 Abnormal weight loss: Secondary | ICD-10-CM | POA: Diagnosis not present

## 2014-05-11 DIAGNOSIS — Z961 Presence of intraocular lens: Secondary | ICD-10-CM | POA: Diagnosis not present

## 2014-05-11 DIAGNOSIS — H04123 Dry eye syndrome of bilateral lacrimal glands: Secondary | ICD-10-CM | POA: Diagnosis not present

## 2014-05-11 DIAGNOSIS — F22 Delusional disorders: Secondary | ICD-10-CM | POA: Diagnosis not present

## 2014-05-11 DIAGNOSIS — G309 Alzheimer's disease, unspecified: Secondary | ICD-10-CM | POA: Diagnosis not present

## 2014-05-13 DIAGNOSIS — Z79899 Other long term (current) drug therapy: Secondary | ICD-10-CM | POA: Diagnosis not present

## 2014-05-13 DIAGNOSIS — S91301A Unspecified open wound, right foot, initial encounter: Secondary | ICD-10-CM | POA: Diagnosis not present

## 2014-05-13 DIAGNOSIS — D649 Anemia, unspecified: Secondary | ICD-10-CM | POA: Diagnosis not present

## 2014-05-13 DIAGNOSIS — S91001A Unspecified open wound, right ankle, initial encounter: Secondary | ICD-10-CM | POA: Diagnosis not present

## 2014-05-17 ENCOUNTER — Encounter: Payer: Self-pay | Admitting: Internal Medicine

## 2014-05-17 ENCOUNTER — Non-Acute Institutional Stay (SKILLED_NURSING_FACILITY): Payer: Medicare Other | Admitting: Internal Medicine

## 2014-05-17 ENCOUNTER — Other Ambulatory Visit: Payer: Self-pay | Admitting: *Deleted

## 2014-05-17 DIAGNOSIS — L97419 Non-pressure chronic ulcer of right heel and midfoot with unspecified severity: Secondary | ICD-10-CM

## 2014-05-17 DIAGNOSIS — M150 Primary generalized (osteo)arthritis: Secondary | ICD-10-CM | POA: Insufficient documentation

## 2014-05-17 DIAGNOSIS — M159 Polyosteoarthritis, unspecified: Secondary | ICD-10-CM

## 2014-05-17 DIAGNOSIS — G309 Alzheimer's disease, unspecified: Secondary | ICD-10-CM

## 2014-05-17 DIAGNOSIS — F0281 Dementia in other diseases classified elsewhere with behavioral disturbance: Secondary | ICD-10-CM | POA: Diagnosis not present

## 2014-05-17 DIAGNOSIS — D509 Iron deficiency anemia, unspecified: Secondary | ICD-10-CM | POA: Diagnosis not present

## 2014-05-17 DIAGNOSIS — R296 Repeated falls: Secondary | ICD-10-CM

## 2014-05-17 DIAGNOSIS — G301 Alzheimer's disease with late onset: Principal | ICD-10-CM

## 2014-05-17 DIAGNOSIS — F22 Delusional disorders: Secondary | ICD-10-CM | POA: Diagnosis not present

## 2014-05-17 DIAGNOSIS — E785 Hyperlipidemia, unspecified: Secondary | ICD-10-CM | POA: Diagnosis not present

## 2014-05-17 DIAGNOSIS — M8949 Other hypertrophic osteoarthropathy, multiple sites: Secondary | ICD-10-CM

## 2014-05-17 DIAGNOSIS — I10 Essential (primary) hypertension: Secondary | ICD-10-CM

## 2014-05-17 HISTORY — DX: Polyosteoarthritis, unspecified: M15.9

## 2014-05-17 HISTORY — DX: Other hypertrophic osteoarthropathy, multiple sites: M89.49

## 2014-05-17 HISTORY — DX: Primary generalized (osteo)arthritis: M15.0

## 2014-05-17 MED ORDER — HYDROCODONE-ACETAMINOPHEN 5-325 MG PO TABS: ORAL_TABLET | ORAL | Status: DC

## 2014-05-17 NOTE — Telephone Encounter (Signed)
[facility] 

## 2014-05-17 NOTE — Progress Notes (Signed)
Patient ID: [name], female   DOB: December 08, 1922, 81 y.o.   MRN: [id number]    [place] [facility] Service: [phone]   05/17/14  Allergies  Allergen Reactions  . Captopril-Hydrochlorothiazide Other (See Comments)    Reaction unknown  . Niacin And Related Other (See Comments)    Reaction unknown  . Urispas [Flavoxate] Other (See Comments)    Reaction unknown  . Verapamil Other (See Comments)    Reaction unknown    Chief Complaint  Patient presents with  . Medical Management of Chronic Issues    HPI:  81 yo female seen today for f/u. She has no c/o. Appetite improved.  Her meds were adjusted last week - aricept reduced to 5 mg , fosamax and lipitor d/c'd. Right foot/ankle xray ordered which was neg for acute process. She was started on Doxy 165m BID with floraster BID x 3 weeks for infected heel ulcer. She is a poor historian due to dementia. Hx obtained from chart  BP controlled on metoprolol, amlodipine and lisinopril  She takes norco prn pain  Mood stable on cymbalta and depakote.  carafate helps GERD sx's  She takes nutritional and vitamin supplements.  Past Medical History  Diagnosis Date  . Diverticulosis of colon (without mention of hemorrhage) 06/24/2006  . Vitamin B12 deficiency   . Alzheimer's disease   . HTN (hypertension)   . Depression   . PVD (peripheral vascular disease)   . CAD (coronary artery disease)   . GERD (gastroesophageal reflux disease)   . H. pylori infection 2004  . Presbyesophagus   . Iron deficiency anemia, unspecified 01/02/2012  . Personal history of fall 01/04/2009  . Unspecified constipation   . Hemorrhage of rectum and anus   . Chest pain, unspecified   . Acute pharyngitis   . Edema   . Cataract   . HLD (hyperlipidemia)   . Osteoporosis, unspecified   . Carcinoma of cecum 1992    History of  . Malignant neoplasm of colon, unspecified site    Past Surgical History  Procedure Laterality  Date  . Gallbladder surgery    . Abdominal hysterectomy  1995    with bso  . Right colectomy    . Cataract extraction  2001  . Cholecystectomy Right   . Hip arthroplasty Right 12/15/2013    Procedure: HEMIARTHROPLASTY RIGHTHIP;  Surgeon: MNewt Minion MD;  Location: [facility]  Service: Orthopedics;  Laterality: Right;   History   Social History  . Marital Status: Widowed    Spouse Name: N/A  . Number of Children: 2  . Years of Education: N/A   Occupational History  . Retired    Social History Main Topics  . Smoking status: Former Smoker    Types: Cigarettes  . Smokeless tobacco: Never Used  . Alcohol Use: No  . Drug Use: No  . Sexual Activity: No   Other Topics Concern  . None   Social History Narrative     Medications: Patient's Medications  New Prescriptions   No medications on file  Previous Medications   ACETAMINOPHEN (TYLENOL) 500 MG TABLET    Take 1,000 mg by mouth every 8 (eight) hours as needed for mild pain.   ALENDRONATE (FOSAMAX) 70 MG TABLET    Take 70 mg by mouth every 7 (seven) days. Takes on Mondays. Take with a full glass of water on an empty stomach.   AMBULATORY NON FORMULARY MEDICATION  Ativan 0.05% PLO Sig: Apply 0.27m to arms topically every 6 hours as needed for anxiety   AMLODIPINE (NORVASC) 10 MG TABLET    Take 10 mg by mouth daily.   ASPIRIN EC 325 MG TABLET    Take 1 tablet (325 mg total) by mouth daily.   ATORVASTATIN (LIPITOR) 10 MG TABLET    Take 5 mg by mouth at bedtime. Half a tab.   CALCIUM CARBONATE-VITAMIN D (CALCIUM-VITAMIN D) 500-200 MG-UNIT PER TABLET    Take 1 tablet by mouth daily.   CHOLECALCIFEROL (VITAMIN D) 2000 UNITS TABLET    Take 2,000 Units by mouth daily.   DIVALPROEX (DEPAKOTE SPRINKLE) 125 MG CAPSULE    Take 125 mg by mouth 2 (two) times daily.    DONEPEZIL (ARICEPT) 10 MG TABLET    Take 10 mg by mouth daily.    DULOXETINE (CYMBALTA) 20 MG CAPSULE    Take 20 mg by mouth daily. At 2pm   DULOXETINE (CYMBALTA) 60 MG  CAPSULE    Take 60 mg by mouth daily. Morning   FERROUS SULFATE 325 (65 FE) MG TABLET    Take 325 mg by mouth daily with breakfast.   HYDROCODONE-ACETAMINOPHEN (NORCO) 5-325 MG PER TABLET    Take one tablet by mouth every 6 hours for pain. Not to exceed 30044mof APAP from all sources/24hr   IPRATROPIUM-ALBUTEROL (DUONEB) 0.5-2.5 (3) MG/3ML SOLN    Take 3 mLs by nebulization every 6 (six) hours as needed (shortness of breath.).   LISINOPRIL (PRINIVIL,ZESTRIL) 10 MG TABLET    Take 10 mg by mouth daily.   METOPROLOL (LOPRESSOR) 25 MG TABLET    Take 1 tablet (25 mg total) by mouth 2 (two) times daily. If pulse <50 or SBP <100 hold med and notify MD.   MIRTAZAPINE (REMERON) 7.5 MG TABLET    Take 7.5 mg by mouth at bedtime.   MULTIPLE VITAMIN (MULTIVITAMIN WITH MINERALS) TABS TABLET    Take 1 tablet by mouth daily.   NUTRITIONAL SUPPLEMENTS (TWOCAL HN) LIQD    Take 90 mLs by mouth 3 (three) times daily.    SENNOSIDES-DOCUSATE SODIUM (SENOKOT-S) 8.6-50 MG TABLET    Take 1 tablet by mouth daily.   SUCRALFATE (CARAFATE) 1 GM/10ML SUSPENSION    Take 1 g by mouth 4 (four) times daily -  before meals and at bedtime.   VITAMIN B-12 (CYANOCOBALAMIN) 1000 MCG TABLET    Take 1,000 mcg by mouth every other day.  Modified Medications   No medications on file  Discontinued Medications   No medications on file     Review of Systems  Unable to perform ROS: Dementia    Filed Vitals:   05/17/14 1548  BP: 122/66  Pulse: 70  Temp: 97.6 F (36.4 C)  Weight: 113 lb (51.256 kg)  SpO2: 98%   Body mass index is 23.62 kg/(m^2).  Physical Exam  Constitutional: She appears well-developed.  Frail appearing in NAD. Lying in bed  HENT:  Mouth/Throat: Oropharynx is clear and moist. No oropharyngeal exudate.  Eyes: Pupils are equal, round, and reactive to light. No scleral icterus.  Neck: Neck supple. Carotid bruit is not present. No tracheal deviation present. No thyromegaly present.  Cardiovascular: Normal  rate, regular rhythm and intact distal pulses.  Exam reveals no gallop and no friction rub.   Murmur (1/6 SEM) heard. No LLE edema. RLE in boot and unable to assess for swelling. no calf TTP.   Pulmonary/Chest: Effort normal. No stridor. No respiratory distress.  She has no wheezes. She has rales (inspiratory dry).  Abdominal: Soft. Bowel sounds are normal. She exhibits no distension and no mass. There is no hepatomegaly. There is no tenderness. There is no rebound and no guarding.  Musculoskeletal:  Right foot boot intact  Lymphadenopathy:    She has no cervical adenopathy.  Neurological: She is alert.  Skin: Skin is warm and dry. No rash noted.  Psychiatric: She has a normal mood and affect. Her behavior is normal.     Labs reviewed: No visits with results within 3 Month(s) from this visit. Latest known visit with results is:  Admission on 12/14/2013, Discharged on 12/17/2013  Component Date Value Ref Range Status  . Sodium 12/14/2013 142  137 - 147 mEq/L Final  . Potassium 12/14/2013 4.1  3.7 - 5.3 mEq/L Final  . Chloride 12/14/2013 106  96 - 112 mEq/L Final  . CO2 12/14/2013 23  19 - 32 mEq/L Final  . Glucose, Bld 12/14/2013 111* 70 - 99 mg/dL Final  . BUN 12/14/2013 32* 6 - 23 mg/dL Final  . Creatinine, Ser 12/14/2013 0.84  0.50 - 1.10 mg/dL Final  . Calcium 12/14/2013 9.7  8.4 - 10.5 mg/dL Final  . GFR calc non Af Amer 12/14/2013 60* >90 mL/min Final  . GFR calc Af Amer 12/14/2013 69* >90 mL/min Final   Comment: (NOTE) The eGFR has been calculated using the CKD EPI equation. This calculation has not been validated in all clinical situations. eGFR's persistently <90 mL/min signify possible Chronic Kidney Disease.   . Anion gap 12/14/2013 13  5 - 15 Final  . WBC 12/14/2013 12.4* 4.0 - 10.5 K/uL Final  . RBC 12/14/2013 3.96  3.87 - 5.11 MIL/uL Final  . Hemoglobin 12/14/2013 11.7* 12.0 - 15.0 g/dL Final  . HCT 12/14/2013 35.6* 36.0 - 46.0 % Final  . MCV 12/14/2013 89.9   78.0 - 100.0 fL Final  . MCH 12/14/2013 29.5  26.0 - 34.0 pg Final  . MCHC 12/14/2013 32.9  30.0 - 36.0 g/dL Final  . RDW 12/14/2013 13.3  11.5 - 15.5 % Final  . Platelets 12/14/2013 239  150 - 400 K/uL Final  . Neutrophils Relative % 12/14/2013 77  43 - 77 % Final  . Neutro Abs 12/14/2013 9.5* 1.7 - 7.7 K/uL Final  . Lymphocytes Relative 12/14/2013 14  12 - 46 % Final  . Lymphs Abs 12/14/2013 1.7  0.7 - 4.0 K/uL Final  . Monocytes Relative 12/14/2013 6  3 - 12 % Final  . Monocytes Absolute 12/14/2013 0.8  0.1 - 1.0 K/uL Final  . Eosinophils Relative 12/14/2013 3  0 - 5 % Final  . Eosinophils Absolute 12/14/2013 0.4  0.0 - 0.7 K/uL Final  . Basophils Relative 12/14/2013 0  0 - 1 % Final  . Basophils Absolute 12/14/2013 0.0  0.0 - 0.1 K/uL Final  . Prothrombin Time 12/14/2013 15.9* 11.6 - 15.2 seconds Final  . INR 12/14/2013 1.26  0.00 - 1.49 Final  . ABO/RH(D) 12/14/2013 O NEG   Final  . Antibody Screen 12/14/2013 NEG   Final  . Sample Expiration 12/14/2013 12/17/2013   Final  . Color, Urine 12/14/2013 YELLOW  YELLOW Final  . APPearance 12/14/2013 CLEAR  CLEAR Final  . Specific Gravity, Urine 12/14/2013 1.024  1.005 - 1.030 Final  . pH 12/14/2013 5.5  5.0 - 8.0 Final  . Glucose, UA 12/14/2013 NEGATIVE  NEGATIVE mg/dL Final  . Hgb urine dipstick 12/14/2013 TRACE* NEGATIVE Final  .  Bilirubin Urine 12/14/2013 NEGATIVE  NEGATIVE Final  . Ketones, ur 12/14/2013 NEGATIVE  NEGATIVE mg/dL Final  . Protein, ur 12/14/2013 NEGATIVE  NEGATIVE mg/dL Final  . Urobilinogen, UA 12/14/2013 0.2  0.0 - 1.0 mg/dL Final  . Nitrite 12/14/2013 NEGATIVE  NEGATIVE Final  . Leukocytes, UA 12/14/2013 NEGATIVE  NEGATIVE Final  . Squamous Epithelial / LPF 12/14/2013 RARE  RARE Final  . WBC, UA 12/14/2013 3-6  <3 WBC/hpf Final  . RBC / HPF 12/14/2013 0-2  <3 RBC/hpf Final  . ABO/RH(D) 12/14/2013 O NEG   Final  . WBC 12/14/2013 11.2* 4.0 - 10.5 K/uL Final  . RBC 12/14/2013 4.01  3.87 - 5.11 MIL/uL Final  .  Hemoglobin 12/14/2013 11.7* 12.0 - 15.0 g/dL Final  . HCT 12/14/2013 35.6* 36.0 - 46.0 % Final  . MCV 12/14/2013 88.8  78.0 - 100.0 fL Final  . MCH 12/14/2013 29.2  26.0 - 34.0 pg Final  . MCHC 12/14/2013 32.9  30.0 - 36.0 g/dL Final  . RDW 12/14/2013 13.3  11.5 - 15.5 % Final  . Platelets 12/14/2013 234  150 - 400 K/uL Final  . Creatinine, Ser 12/14/2013 0.79  0.50 - 1.10 mg/dL Final  . GFR calc non Af Amer 12/14/2013 72* >90 mL/min Final  . GFR calc Af Amer 12/14/2013 83* >90 mL/min Final   Comment: (NOTE) The eGFR has been calculated using the CKD EPI equation. This calculation has not been validated in all clinical situations. eGFR's persistently <90 mL/min signify possible Chronic Kidney Disease.   . Valproic Acid Lvl 12/14/2013 <10.0* 50.0 - 100.0 ug/mL Final   Performed at [facility]  . Specimen Description 12/14/2013 URINE, CATHETERIZED   Final  . Special Requests 12/14/2013 NONE   Final  . Culture  Setup Time 12/14/2013    Final                   Value:12/15/2013 06:19 Performed at [facility]   . Colony Count 12/14/2013    Final                   Value:NO GROWTH Performed at [facility]   . Culture 12/14/2013    Final                   Value:NO GROWTH Performed at [facility]   . Report Status 12/14/2013 12/16/2013 FINAL   Final  . Magnesium 12/15/2013 2.1  1.5 - 2.5 mg/dL Final  . MRSA, PCR 12/15/2013 NEGATIVE  NEGATIVE Final  . Staphylococcus aureus 12/15/2013 NEGATIVE  NEGATIVE Final   Comment:        The Xpert SA Assay (FDA approved for NASAL specimens in patients over 76 years of age), is one component of a comprehensive surveillance program.  Test performance has been validated by [employer] for patients greater than or equal to 75 year old. It is not intended to diagnose infection nor to guide or monitor treatment.   . Sodium 12/16/2013 145  137 - 147 mEq/L Final  . Potassium 12/16/2013 3.9  3.7 - 5.3 mEq/L Final   . Chloride 12/16/2013 109  96 - 112 mEq/L Final  . CO2 12/16/2013 23  19 - 32 mEq/L Final  . Glucose, Bld 12/16/2013 102* 70 - 99 mg/dL Final  . BUN 12/16/2013 20  6 - 23 mg/dL Final  . Creatinine, Ser 12/16/2013 0.64  0.50 - 1.10 mg/dL Final  . Calcium 12/16/2013 8.8  8.4 -  10.5 mg/dL Final  . Total Protein 12/16/2013 5.6* 6.0 - 8.3 g/dL Final  . Albumin 12/16/2013 2.6* 3.5 - 5.2 g/dL Final  . AST 12/16/2013 16  0 - 37 U/L Final  . ALT 12/16/2013 10  0 - 35 U/L Final  . Alkaline Phosphatase 12/16/2013 49  39 - 117 U/L Final  . Total Bilirubin 12/16/2013 0.7  0.3 - 1.2 mg/dL Final  . GFR calc non Af Amer 12/16/2013 77* >90 mL/min Final  . GFR calc Af Amer 12/16/2013 89* >90 mL/min Final   Comment: (NOTE) The eGFR has been calculated using the CKD EPI equation. This calculation has not been validated in all clinical situations. eGFR's persistently <90 mL/min signify possible Chronic Kidney Disease.   . Anion gap 12/16/2013 13  5 - 15 Final  . WBC 12/16/2013 9.9  4.0 - 10.5 K/uL Final  . RBC 12/16/2013 3.17* 3.87 - 5.11 MIL/uL Final  . Hemoglobin 12/16/2013 9.0* 12.0 - 15.0 g/dL Final  . HCT 12/16/2013 28.5* 36.0 - 46.0 % Final  . MCV 12/16/2013 89.9  78.0 - 100.0 fL Final  . MCH 12/16/2013 28.4  26.0 - 34.0 pg Final  . MCHC 12/16/2013 31.6  30.0 - 36.0 g/dL Final  . RDW 12/16/2013 13.5  11.5 - 15.5 % Final  . Platelets 12/16/2013 202  150 - 400 K/uL Final  . Neutrophils Relative % 12/16/2013 77  43 - 77 % Final  . Neutro Abs 12/16/2013 7.6  1.7 - 7.7 K/uL Final  . Lymphocytes Relative 12/16/2013 14  12 - 46 % Final  . Lymphs Abs 12/16/2013 1.4  0.7 - 4.0 K/uL Final  . Monocytes Relative 12/16/2013 8  3 - 12 % Final  . Monocytes Absolute 12/16/2013 0.8  0.1 - 1.0 K/uL Final  . Eosinophils Relative 12/16/2013 1  0 - 5 % Final  . Eosinophils Absolute 12/16/2013 0.1  0.0 - 0.7 K/uL Final  . Basophils Relative 12/16/2013 0  0 - 1 % Final  . Basophils Absolute 12/16/2013 0.0  0.0 -  0.1 K/uL Final  . Sodium 12/17/2013 146  137 - 147 mEq/L Final  . Potassium 12/17/2013 3.9  3.7 - 5.3 mEq/L Final  . Chloride 12/17/2013 108  96 - 112 mEq/L Final  . CO2 12/17/2013 26  19 - 32 mEq/L Final  . Glucose, Bld 12/17/2013 90  70 - 99 mg/dL Final  . BUN 12/17/2013 21  6 - 23 mg/dL Final  . Creatinine, Ser 12/17/2013 0.67  0.50 - 1.10 mg/dL Final  . Calcium 12/17/2013 8.8  8.4 - 10.5 mg/dL Final  . Total Protein 12/17/2013 5.8* 6.0 - 8.3 g/dL Final  . Albumin 12/17/2013 2.6* 3.5 - 5.2 g/dL Final  . AST 12/17/2013 16  0 - 37 U/L Final  . ALT 12/17/2013 10  0 - 35 U/L Final  . Alkaline Phosphatase 12/17/2013 60  39 - 117 U/L Final  . Total Bilirubin 12/17/2013 0.6  0.3 - 1.2 mg/dL Final  . GFR calc non Af Amer 12/17/2013 76* >90 mL/min Final  . GFR calc Af Amer 12/17/2013 88* >90 mL/min Final   Comment: (NOTE) The eGFR has been calculated using the CKD EPI equation. This calculation has not been validated in all clinical situations. eGFR's persistently <90 mL/min signify possible Chronic Kidney Disease.   . Anion gap 12/17/2013 12  5 - 15 Final  . WBC 12/17/2013 11.7* 4.0 - 10.5 K/uL Final  . RBC 12/17/2013 2.96* 3.87 - 5.11 MIL/uL  Final  . Hemoglobin 12/17/2013 8.7* 12.0 - 15.0 g/dL Final  . HCT 12/17/2013 26.9* 36.0 - 46.0 % Final  . MCV 12/17/2013 90.9  78.0 - 100.0 fL Final  . MCH 12/17/2013 29.4  26.0 - 34.0 pg Final  . MCHC 12/17/2013 32.3  30.0 - 36.0 g/dL Final  . RDW 12/17/2013 13.5  11.5 - 15.5 % Final  . Platelets 12/17/2013 199  150 - 400 K/uL Final  . Neutrophils Relative % 12/17/2013 75  43 - 77 % Final  . Neutro Abs 12/17/2013 8.8* 1.7 - 7.7 K/uL Final  . Lymphocytes Relative 12/17/2013 17  12 - 46 % Final  . Lymphs Abs 12/17/2013 2.0  0.7 - 4.0 K/uL Final  . Monocytes Relative 12/17/2013 7  3 - 12 % Final  . Monocytes Absolute 12/17/2013 0.8  0.1 - 1.0 K/uL Final  . Eosinophils Relative 12/17/2013 1  0 - 5 % Final  . Eosinophils Absolute 12/17/2013 0.1   0.0 - 0.7 K/uL Final  . Basophils Relative 12/17/2013 0  0 - 1 % Final  . Basophils Absolute 12/17/2013 0.0  0.0 - 0.1 K/uL Final     Assessment/Plan   ICD-9-CM ICD-10-CM   1. Dementia of the Alzheimer's type, with late onset, with delusions - stable 331.0 G30.9    290.20 F02.81     F22   2. Ulcer of right heel, with unspecified severity - on abx 707.14 L97.419   3. Recurrent falls V15.88 R29.6   4. Primary osteoarthritis involving multiple joints - stable 715.09 M15.0   5. Essential hypertension, benign - stable 401.1 I10   6. HLD (hyperlipidemia) - stable 272.4 E78.5   7. Anemia, iron deficiency - stable 280.9 D50.9    --finish abx (doxy)  --cont other meds as ordered  --PT/OT as indicated  --fall precautions  --wound care as ordered  --will follow  Tomie Spizzirri S. Perlie Gold  [facility] [address] [place], [place] [address] [phone] Office (Wednesdays and Fridays 8 AM - 5 PM) [phone] Cell (Monday-Friday 8 AM - 5 PM)

## 2014-05-19 ENCOUNTER — Encounter: Payer: Self-pay | Admitting: *Deleted

## 2014-06-03 ENCOUNTER — Non-Acute Institutional Stay (SKILLED_NURSING_FACILITY): Payer: Medicare Other | Admitting: Adult Health

## 2014-06-03 DIAGNOSIS — L97419 Non-pressure chronic ulcer of right heel and midfoot with unspecified severity: Secondary | ICD-10-CM | POA: Diagnosis not present

## 2014-06-04 DIAGNOSIS — I70234 Atherosclerosis of native arteries of right leg with ulceration of heel and midfoot: Secondary | ICD-10-CM | POA: Diagnosis not present

## 2014-06-04 DIAGNOSIS — L97419 Non-pressure chronic ulcer of right heel and midfoot with unspecified severity: Secondary | ICD-10-CM | POA: Diagnosis not present

## 2014-06-07 ENCOUNTER — Other Ambulatory Visit: Payer: Self-pay | Admitting: *Deleted

## 2014-06-07 MED ORDER — HYDROCODONE-ACETAMINOPHEN 5-325 MG PO TABS: ORAL_TABLET | ORAL | Status: DC

## 2014-06-17 NOTE — Progress Notes (Signed)
Patient ID: [name], female   DOB: 01/24/1922, 81 y.o.   MRN: [id number]  [facility]     Allergies  Allergen Reactions  . Captopril-Hydrochlorothiazide Other (See Comments)    Reaction unknown  . Niacin And Related Other (See Comments)    Reaction unknown  . Urispas [Flavoxate] Other (See Comments)    Reaction unknown  . Verapamil Other (See Comments)    Reaction unknown       Chief Complaint  Patient presents with  . Medical Management of Chronic Issues    HPI:  She is a long term resident of this facility being seen for the management of her chronic illnesses. Overall there is little change in her status. She was treated last month for her right heel infection this has improved. She is unable to fully participate in the hpi or ros; but states that she is feeling good. There are no nursing concerns at this time.    Past Medical History  Diagnosis Date  . Diverticulosis of colon (without mention of hemorrhage) 06/24/2006  . Vitamin B12 deficiency   . Alzheimer's disease   . HTN (hypertension)   . Depression   . PVD (peripheral vascular disease)   . CAD (coronary artery disease)   . GERD (gastroesophageal reflux disease)   . H. pylori infection 2004  . Presbyesophagus   . Iron deficiency anemia, unspecified 01/02/2012  . Personal history of fall 01/04/2009  . Unspecified constipation   . Hemorrhage of rectum and anus   . Chest pain, unspecified   . Acute pharyngitis   . Edema   . Cataract   . HLD (hyperlipidemia)   . Osteoporosis, unspecified   . Carcinoma of cecum 1992    History of  . Malignant neoplasm of colon, unspecified site     Past Surgical History  Procedure Laterality Date  . Gallbladder surgery    . Abdominal hysterectomy  1995    with bso  . Right colectomy    . Cataract extraction  2001  . Cholecystectomy Right   . Hip arthroplasty Right 12/15/2013    Procedure: HEMIARTHROPLASTY RIGHTHIP;  Surgeon: Newt Minion, MD;  Location: [facility];  Service: Orthopedics;  Laterality: Right;    VITAL SIGNS BP 100/56 mmHg  Pulse 93  Ht 4\' 10"  (1.473 m)  Wt 115 lb (52.164 kg)  BMI 24.04 kg/m2  SpO2 97%   Outpatient Encounter Prescriptions as of 04/22/2014  Medication Sig   . acetaminophen (TYLENOL) 500 MG tablet Take 1,000 mg by mouth every 8 (eight) hours as needed for mild pain.  Marland Kitchen alendronate (FOSAMAX) 70 MG tablet Take 70 mg by mouth every 7 (seven) days. Takes on Mondays. Take with a full glass of water on an empty stomach.  . AMBULATORY NON FORMULARY MEDICATION Ativan 0.05% PLO Sig: Apply 0.5mg  to arms topically every 6 hours as needed for anxiety  . amLODipine (NORVASC) 10 MG tablet Take 10 mg by mouth daily.  Marland Kitchen aspirin EC 325 MG tablet Take 1 tablet (325 mg total) by mouth daily.  Marland Kitchen atorvastatin (LIPITOR) 10 MG tablet Take 5 mg by mouth at bedtime. Half a tab.  . Calcium Carbonate-Vitamin D (CALCIUM-VITAMIN D) 500-200 MG-UNIT per tablet Take 1 tablet by mouth daily.  . Cholecalciferol (VITAMIN D) 2000 UNITS tablet Take 2,000 Units by mouth daily.  . divalproex (DEPAKOTE SPRINKLE) 125 MG capsule Take 125 mg by mouth 2 (two) times daily.   Marland Kitchen donepezil (ARICEPT) 10 MG tablet Take 10  mg by mouth daily.   . DULoxetine (CYMBALTA) 20 MG capsule Take 20 mg by mouth daily. At 2pm  . DULoxetine (CYMBALTA) 60 MG capsule Take 60 mg by mouth daily. Morning  . ferrous sulfate 325 (65 FE) MG tablet Take 325 mg by mouth daily with breakfast.  . ipratropium-albuterol (DUONEB) 0.5-2.5 (3) MG/3ML SOLN Take 3 mLs by nebulization every 6 (six) hours as needed (shortness of breath.).  Marland Kitchen lisinopril (PRINIVIL,ZESTRIL) 10 MG tablet Take 10 mg by mouth daily.  . metoprolol (LOPRESSOR) 25 MG tablet Take 1 tablet (25 mg total) by mouth 2 (two) times daily. If pulse <50 or SBP <100 hold med and notify MD.  . Multiple Vitamin (MULTIVITAMIN WITH MINERALS) TABS tablet Take 1 tablet by mouth daily.  . Nutritional Supplements (TWOCAL HN) LIQD Take 90  mLs by mouth 3 (three) times daily.   . sennosides-docusate sodium (SENOKOT-S) 8.6-50 MG tablet Take 1 tablet by mouth daily.  . sucralfate (CARAFATE) 1 GM/10ML suspension Take 1 g by mouth 4 (four) times daily -  before meals and at bedtime.  . vitamin B-12 (CYANOCOBALAMIN) 1000 MCG tablet Take 1,000 mcg by mouth every other day.     SIGNIFICANT DIAGNOSTIC EXAMS   12-14-13: right hip x-ray: 1. Acute, impacted right femoral neck fracture. The distal fracture fragment is anteriorly displaced approximately 2 cm on the cross-table lateral view.  2. Decreased bony mineralization.  3. Cannot exclude an L3 compression fracture. No prior images of the lumbar spine available.  4. Prominent amount of stool in the rectum.  12-14-13: chest x-ray: Right basilar atelectasis.  01-27-14: chest x-ray: density right lung base suggestive of pneumonia possible associated effusion.   01-29-14: chest x-ray: right lower lobe plate like atelectasis in comparison no significant change. Atherosclerotic aorta. Moderate demineralization     03-13-14: chest x-ray: no acute cardiopulmonary disease process. Chronic interstial    LABS REVIEWED:   12-07-13: urine culture: e-coli: zosyn 12-10-13: wbc 6.5; hgb 10.8; hct 35.4; mcv 91.7; ;plt 223; glucose 137; bun 18.2; creat 0.62; k+4.2; na++142 12-12-13: chol 180; ldl 102; trig 170; liver normal albumin 4.2 12-14-13: wbc 12.4; hgb 11.7; hct 35.6; mcv 89.9; plt 239; glucose 111; bun 32; creat 0.84; k+4.1 ;n++142; urine culture: no growth  12-17-13: wbc 11.7; hgb 8.7; hct 26.9; mcv 90.9; plt 199; glucose 90; bun 21; creat 0.7; k+3.9; na++146; liver normal albumin 2.6  12-18-13: glucose 93; bun 16.9; creat 0.53; k+3.9; na++145 12-22-13: wbc 9.0; hgb 7.8; hct 25.7; mcv 92.2 ;plt 346 12-24-13: glucose 78; bun 17.5; creat 0.55; k+4.1; na++143  01-29-14: wbc 8.6; hgb 10.4; hct 33.9; mcv 84.4; plt 369; glucose 98; bun 10.3; creat 0.52; k+3.9; na++144 03-13-14: wbc 12.3; hgb 9.6; hct  31.8; mcv 82.5 ;plt 229; glucose 81; bun 35.3;creat 0.75; k+4.0; na++146; liver norm albumin 3.4 03-17-14: right heel culture: staph aureus: septra ds  03-20-14: pre-albumin 12      ROS Unable to perform ROS    Physical Exam Constitutional: No distress.  Frail   Neck: Neck supple. No JVD present. No thyromegaly present.  Cardiovascular: Normal rate, regular rhythm and intact distal pulses.   Respiratory: Effort normal and breath sounds normal. No respiratory distress. She has no wheezes.  GI: Soft. Bowel sounds are normal. She exhibits no distension. There is no tenderness.  Musculoskeletal: She exhibits no edema.  Is able to move all extremities; is status post right femoral neck fracture Neurological: She is alert.  Skin: Skin is warm and dry. She  is not diaphoretic. Right heel ulceration: with eschar present    ASSESSMENT/ PLAN:   1. Right heel ulceration: will continue vicodin 5/325 mg every 6 hours routinely for pain management and will continue current treatment and will monitor her status. Her pre-albumin is 12   2. Hypertension: will continue norvasc 10 mg daily; lopressor 25 mg twice daily and lisinopril 10 mg daily will continue her asa at 325 mg daily and will monitor her status.   3. Dysphagia: no signs of aspiration present; will continue thin liquids and will monitor her status.   4. Anorexia: her current weight is 115 pounds is presently not taking medications; will monitor her status.   5. Anemia: will continue iron daily hgb is 9.6  6. Osteoporosis: she is status post right femoral neck fracture; will continue fosamax 70 mg weekly; ca++500/200 mg daily; vitamin d 2000 units daily and will monitor   7. Constipation: will continue senna s nightly   8. Dyslipidemia: will continue lipitor 5 mg daily   9. Dementia: no significant change in her status; will continue her aricept 10 mg nightly and will monitor her status. Her weight is presently 115 .   10.  Depression: is stable will continue cymbalta 60 mg in the am and 20 mg in the afternoon; will continue depakote sprinkle 125 mg twice daily to help stabilize mood and will monitor her status.       Ok Edwards NP [facility]  Contact [phone] Monday through Friday 8am- 5pm  After hours call [phone]

## 2014-06-18 DIAGNOSIS — R634 Abnormal weight loss: Secondary | ICD-10-CM | POA: Insufficient documentation

## 2014-06-18 MED ORDER — DONEPEZIL HCL 10 MG PO TABS: 5.0000 mg | ORAL_TABLET | Freq: Every day | ORAL | Status: DC

## 2014-06-18 NOTE — Progress Notes (Signed)
Patient ID: [name], female   DOB: Feb 17, 1922, 81 y.o.   MRN: [id number]  [facility]     Allergies  Allergen Reactions  . Captopril-Hydrochlorothiazide Other (See Comments)    Reaction unknown  . Niacin And Related Other (See Comments)    Reaction unknown  . Urispas [Flavoxate] Other (See Comments)    Reaction unknown  . Verapamil Other (See Comments)    Reaction unknown       Chief Complaint  Patient presents with  . Acute Visit    right heel wound and weight loss     HPI:  She is a long term resident of this facility being seen for the management of her right heel ulceration and her weight loss. Her weight has gone from 119 pounds last month to 113 pounds. Her right heel ulceration has worsened with softness under the eschar; foul odor present without drainage; and inflammation present.   Past Medical History  Diagnosis Date  . Diverticulosis of colon (without mention of hemorrhage) 06/24/2006  . Vitamin B12 deficiency   . Alzheimer's disease   . HTN (hypertension)   . Depression   . PVD (peripheral vascular disease)   . CAD (coronary artery disease)   . GERD (gastroesophageal reflux disease)   . H. pylori infection 2004  . Presbyesophagus   . Iron deficiency anemia, unspecified 01/02/2012  . Personal history of fall 01/04/2009  . Unspecified constipation   . Hemorrhage of rectum and anus   . Chest pain, unspecified   . Acute pharyngitis   . Edema   . Cataract   . HLD (hyperlipidemia)   . Osteoporosis, unspecified   . Carcinoma of cecum 1992    History of  . Malignant neoplasm of colon, unspecified site     Past Surgical History  Procedure Laterality Date  . Gallbladder surgery    . Abdominal hysterectomy  1995    with bso  . Right colectomy    . Cataract extraction  2001  . Cholecystectomy Right   . Hip arthroplasty Right 12/15/2013    Procedure: HEMIARTHROPLASTY RIGHTHIP;  Surgeon: Newt Minion, MD;  Location: [facility];  Service: Orthopedics;   Laterality: Right;    VITAL SIGNS BP 143/75 mmHg  Pulse 76  Ht 4\' 10"  (1.473 m)  Wt 113 lb (51.256 kg)  BMI 23.62 kg/m2   Outpatient Encounter Prescriptions as of 05/11/2014  Medication Sig  . acetaminophen (TYLENOL) 500 MG tablet Take 1,000 mg by mouth every 8 (eight) hours as needed for mild pain.  Marland Kitchen alendronate (FOSAMAX) 70 MG tablet Take 70 mg by mouth every 7 (seven) days. Takes on Mondays. Take with a full glass of water on an empty stomach.  . AMBULATORY NON FORMULARY MEDICATION Ativan 0.05% PLO Sig: Apply 0.5mg  to arms topically every 6 hours as needed for anxiety  . amLODipine (NORVASC) 10 MG tablet Take 10 mg by mouth daily. For hypertension  . aspirin EC 325 MG tablet Take 1 tablet (325 mg total) by mouth daily.  Marland Kitchen atorvastatin (LIPITOR) 10 MG tablet Take 5 mg by mouth at bedtime. Half a tab.  . Calcium Carbonate-Vitamin D (CALCIUM-VITAMIN D) 500-200 MG-UNIT per tablet Take 1 tablet by mouth daily. f  . Cholecalciferol (VITAMIN D) 2000 UNITS tablet Take 2,000 Units by mouth daily. For osteoporosis  . divalproex (DEPAKOTE SPRINKLE) 125 MG capsule Take 125 mg by mouth 2 (two) times daily.   Marland Kitchen donepezil (ARICEPT) 10 MG tablet Take 10 mg by mouth daily.  For alzheimers disease  . DULoxetine (CYMBALTA) 20 MG capsule Take 20 mg by mouth daily. At 2pm  . DULoxetine (CYMBALTA) 60 MG capsule Take 60 mg by mouth daily. Morning  . ferrous sulfate 325 (65 FE) MG tablet Take 325 mg by mouth daily with breakfast.  . ipratropium-albuterol (DUONEB) 0.5-2.5 (3) MG/3ML SOLN Take 3 mLs by nebulization every 6 (six) hours as needed (shortness of breath.).  Marland Kitchen lisinopril (PRINIVIL,ZESTRIL) 10 MG tablet Take 10 mg by mouth daily.  . metoprolol (LOPRESSOR) 25 MG tablet Take 1 tablet (25 mg total) by mouth 2 (two) times daily. If pulse <50 or SBP <100 hold med and notify MD.  . Multiple Vitamin (MULTIVITAMIN WITH MINERALS) TABS tablet Take 1 tablet by mouth daily.  . Nutritional Supplements (TWOCAL  HN) LIQD Take 90 mLs by mouth 3 (three) times daily.   . sennosides-docusate sodium (SENOKOT-S) 8.6-50 MG tablet Take 1 tablet by mouth daily.  . sucralfate (CARAFATE) 1 GM/10ML suspension Take 1 g by mouth 4 (four) times daily -  before meals and at bedtime.  . vitamin B-12 (CYANOCOBALAMIN) 1000 MCG tablet Take 1,000 mcg by mouth every other day.      SIGNIFICANT DIAGNOSTIC EXAMS   12-14-13: right hip x-ray: 1. Acute, impacted right femoral neck fracture. The distal fracture fragment is anteriorly displaced approximately 2 cm on the cross-table lateral view.  2. Decreased bony mineralization.  3. Cannot exclude an L3 compression fracture. No prior images of the lumbar spine available.  4. Prominent amount of stool in the rectum.  12-14-13: chest x-ray: Right basilar atelectasis.  01-27-14: chest x-ray: density right lung base suggestive of pneumonia possible associated effusion.   01-29-14: chest x-ray: right lower lobe plate like atelectasis in comparison no significant change. Atherosclerotic aorta. Moderate demineralization     03-13-14: chest x-ray: no acute cardiopulmonary disease process. Chronic interstial    LABS REVIEWED:   12-07-13: urine culture: e-coli: zosyn 12-10-13: wbc 6.5; hgb 10.8; hct 35.4; mcv 91.7; ;plt 223; glucose 137; bun 18.2; creat 0.62; k+4.2; na++142 12-12-13: chol 180; ldl 102; trig 170; liver normal albumin 4.2 12-14-13: wbc 12.4; hgb 11.7; hct 35.6; mcv 89.9; plt 239; glucose 111; bun 32; creat 0.84; k+4.1 ;n++142; urine culture: no growth  12-17-13: wbc 11.7; hgb 8.7; hct 26.9; mcv 90.9; plt 199; glucose 90; bun 21; creat 0.7; k+3.9; na++146; liver normal albumin 2.6  12-18-13: glucose 93; bun 16.9; creat 0.53; k+3.9; na++145 12-22-13: wbc 9.0; hgb 7.8; hct 25.7; mcv 92.2 ;plt 346 12-24-13: glucose 78; bun 17.5; creat 0.55; k+4.1; na++143  01-29-14: wbc 8.6; hgb 10.4; hct 33.9; mcv 84.4; plt 369; glucose 98; bun 10.3; creat 0.52; k+3.9; na++144 03-13-14: wbc  12.3; hgb 9.6; hct 31.8; mcv 82.5 ;plt 229; glucose 81; bun 35.3;creat 0.75; k+4.0; na++146; liver norm albumin 3.4 03-17-14: right heel culture: staph aureus: septra ds  03-20-14: pre-albumin 12       Review of Systems  Unable to perform ROS    Physical Exam Constitutional: No distress.  Frail   Neck: Neck supple. No JVD present. No thyromegaly present.  Cardiovascular: Normal rate, regular rhythm and intact distal pulses.   Respiratory: Effort normal and breath sounds normal. No respiratory distress. She has no wheezes.  GI: Soft. Bowel sounds are normal. She exhibits no distension. There is no tenderness.  Musculoskeletal: She exhibits no edema.  Is able to move all extremities; is status post right femoral neck fracture Neurological: She is alert.  Skin: Skin is warm  and dry. She is not diaphoretic. Right heel ulceration: with eschar present; softness underneath; foul odor present with no drainage present. Is inflamed.    ASSESSMENT/ PLAN:  1. Weight loss: will stop the fosamax at this time and will stop the lipitor and will lower her aricept to 5 mg nightly and will continue supplements per facility protocol and will monitor her status.   2. Right heel ulceration: will x-ray right foot and heel. Will being doxycycline 100 mg twice daily for 3 week with florastor twice daily  for 3 weeks. will continue wound care per facility protocol and will monitor her status.   Time spent with patient 45 minutes.    Ok Edwards NP [facility]  Contact [phone] Monday through Friday 8am- 5pm  After hours call [phone]

## 2014-06-23 ENCOUNTER — Non-Acute Institutional Stay (SKILLED_NURSING_FACILITY): Payer: Medicare Other | Admitting: Adult Health

## 2014-06-23 DIAGNOSIS — K5900 Constipation, unspecified: Secondary | ICD-10-CM

## 2014-06-23 DIAGNOSIS — R634 Abnormal weight loss: Secondary | ICD-10-CM | POA: Diagnosis not present

## 2014-06-23 DIAGNOSIS — F0281 Dementia in other diseases classified elsewhere with behavioral disturbance: Secondary | ICD-10-CM

## 2014-06-23 DIAGNOSIS — D509 Iron deficiency anemia, unspecified: Secondary | ICD-10-CM

## 2014-06-23 DIAGNOSIS — I2510 Atherosclerotic heart disease of native coronary artery without angina pectoris: Secondary | ICD-10-CM | POA: Diagnosis not present

## 2014-06-23 DIAGNOSIS — I10 Essential (primary) hypertension: Secondary | ICD-10-CM | POA: Diagnosis not present

## 2014-06-23 DIAGNOSIS — F22 Delusional disorders: Secondary | ICD-10-CM | POA: Diagnosis not present

## 2014-06-23 DIAGNOSIS — L97419 Non-pressure chronic ulcer of right heel and midfoot with unspecified severity: Secondary | ICD-10-CM | POA: Diagnosis not present

## 2014-06-23 DIAGNOSIS — G309 Alzheimer's disease, unspecified: Secondary | ICD-10-CM | POA: Diagnosis not present

## 2014-06-23 DIAGNOSIS — K219 Gastro-esophageal reflux disease without esophagitis: Secondary | ICD-10-CM | POA: Diagnosis not present

## 2014-06-23 DIAGNOSIS — I739 Peripheral vascular disease, unspecified: Secondary | ICD-10-CM | POA: Diagnosis not present

## 2014-06-23 DIAGNOSIS — G301 Alzheimer's disease with late onset: Secondary | ICD-10-CM

## 2014-06-30 ENCOUNTER — Encounter: Payer: Self-pay | Admitting: Adult Health

## 2014-06-30 DIAGNOSIS — I739 Peripheral vascular disease, unspecified: Secondary | ICD-10-CM | POA: Insufficient documentation

## 2014-06-30 NOTE — Progress Notes (Signed)
Patient ID: [name], female   DOB: 1922-04-10, 81 y.o.   MRN: [id number]  [facility]     Allergies  Allergen Reactions  . Captopril-Hydrochlorothiazide Other (See Comments)    Reaction unknown  . Niacin And Related Other (See Comments)    Reaction unknown  . Urispas [Flavoxate] Other (See Comments)    Reaction unknown  . Verapamil Other (See Comments)    Reaction unknown       Chief Complaint  Patient presents with  . Medical Management of Chronic Issues    HPI:  She is a long term resident of this facility being seen for the management of her chronic illnesses. Her right heel ulceration self debrided the eschar with a wound present without signs of infection present. She does have severe ischemia present in the right leg. She cannot fully participate in the hpi or ros. There are no nursing concerns at this time.    Past Medical History  Diagnosis Date  . Diverticulosis of colon (without mention of hemorrhage) 06/24/2006  . Vitamin B12 deficiency   . Alzheimer's disease   . HTN (hypertension)   . Depression   . PVD (peripheral vascular disease)   . CAD (coronary artery disease)   . GERD (gastroesophageal reflux disease)   . H. pylori infection 2004  . Presbyesophagus   . Iron deficiency anemia, unspecified 01/02/2012  . Personal history of fall 01/04/2009  . Unspecified constipation   . Hemorrhage of rectum and anus   . Chest pain, unspecified   . Acute pharyngitis   . Edema   . Cataract   . HLD (hyperlipidemia)   . Osteoporosis, unspecified   . Carcinoma of cecum 1992    History of  . Malignant neoplasm of colon, unspecified site     Past Surgical History  Procedure Laterality Date  . Gallbladder surgery    . Abdominal hysterectomy  1995    with bso  . Right colectomy    . Cataract extraction  2001  . Cholecystectomy Right   . Hip arthroplasty Right 12/15/2013    Procedure: HEMIARTHROPLASTY RIGHTHIP;  Surgeon: Newt Minion, MD;  Location: [facility];  Service: Orthopedics;  Laterality: Right;    VITAL SIGNS BP 139/68 mmHg  Pulse 62  Ht 4\' 10"  (1.473 m)  Wt 116 lb (52.617 kg)  BMI 24.25 kg/m2  SpO2 98%   Outpatient Encounter Prescriptions as of 06/23/2014  Medication Sig  . acetaminophen (TYLENOL) 500 MG tablet Take 1,000 mg by mouth every 8 (eight) hours as needed for mild pain.  Marland Kitchen AMBULATORY NON FORMULARY MEDICATION Ativan 0.05% PLO Sig: Apply 0.5mg  to arms topically every 6 hours as needed for anxiety  . amLODipine (NORVASC) 10 MG tablet Take 10 mg by mouth daily. For hypertension  . aspirin EC 325 MG tablet Take 1 tablet (325 mg total) by mouth daily.  . Calcium Carbonate-Vitamin D (CALCIUM-VITAMIN D) 500-200 MG-UNIT per tablet Take 1 tablet by mouth daily. f  . Cholecalciferol (VITAMIN D) 2000 UNITS tablet Take 2,000 Units by mouth daily. For osteoporosis  . divalproex (DEPAKOTE SPRINKLE) 125 MG capsule Take 125 mg by mouth 2 (two) times daily.   Marland Kitchen donepezil (ARICEPT) 10 MG tablet Take 0.5 tablets (5 mg total) by mouth daily. For alzheimers disease  . ferrous sulfate 325 (65 FE) MG tablet Take 325 mg by mouth daily with breakfast.  . HYDROcodone-acetaminophen (NORCO) 5-325 MG per tablet Take one tablet by mouth every 6 hours for pain. Not  to exceed 3000mg  of APAP from all sources/24hr  . ipratropium-albuterol (DUONEB) 0.5-2.5 (3) MG/3ML SOLN Take 3 mLs by nebulization every 6 (six) hours as needed (shortness of breath.).  Marland Kitchen lisinopril (PRINIVIL,ZESTRIL) 10 MG tablet Take 10 mg by mouth daily.  . metoprolol (LOPRESSOR) 25 MG tablet Take 1 tablet (25 mg total) by mouth 2 (two) times daily. If pulse <50 or SBP <100 hold med and notify MD.  . Multiple Vitamin (MULTIVITAMIN WITH MINERALS) TABS tablet Take 1 tablet by mouth daily.  . Nutritional Supplements (TWOCAL HN) LIQD Take 90 mLs by mouth 3 (three) times daily.   . sennosides-docusate sodium (SENOKOT-S) 8.6-50 MG tablet Take 1 tablet by mouth daily.  . sucralfate (CARAFATE) 1  GM/10ML suspension Take 1 g by mouth 4 (four) times daily -  before meals and at bedtime.  . vitamin B-12 (CYANOCOBALAMIN) 1000 MCG tablet Take 1,000 mcg by mouth every other day.      SIGNIFICANT DIAGNOSTIC EXAMS  12-14-13: right hip x-ray: 1. Acute, impacted right femoral neck fracture. The distal fracture fragment is anteriorly displaced approximately 2 cm on the cross-table lateral view.  2. Decreased bony mineralization.  3. Cannot exclude an L3 compression fracture. No prior images of the lumbar spine available.  4. Prominent amount of stool in the rectum.  12-14-13: chest x-ray: Right basilar atelectasis.  01-27-14: chest x-ray: density right lung base suggestive of pneumonia possible associated effusion.   01-29-14: chest x-ray: right lower lobe plate like atelectasis in comparison no significant change. Atherosclerotic aorta. Moderate demineralization     03-13-14: chest x-ray: no acute cardiopulmonary disease process. Chronic interstial   05-13-14: right foot x-ray: no acute osseous abnormality  06-04-14; right ABI: moderate plaque right superficial femoral artery stenosis; infrapopliteal arterial occlusion. Right leg changes are in the severe ischemia range.      LABS REVIEWED:   12-07-13: urine culture: e-coli: zosyn 12-10-13: wbc 6.5; hgb 10.8; hct 35.4; mcv 91.7; ;plt 223; glucose 137; bun 18.2; creat 0.62; k+4.2; na++142 12-12-13: chol 180; ldl 102; trig 170; liver normal albumin 4.2 12-14-13: wbc 12.4; hgb 11.7; hct 35.6; mcv 89.9; plt 239; glucose 111; bun 32; creat 0.84; k+4.1 ;n++142; urine culture: no growth  12-17-13: wbc 11.7; hgb 8.7; hct 26.9; mcv 90.9; plt 199; glucose 90; bun 21; creat 0.7; k+3.9; na++146; liver normal albumin 2.6  12-18-13: glucose 93; bun 16.9; creat 0.53; k+3.9; na++145 12-22-13: wbc 9.0; hgb 7.8; hct 25.7; mcv 92.2 ;plt 346 12-24-13: glucose 78; bun 17.5; creat 0.55; k+4.1; na++143  01-29-14: wbc 8.6; hgb 10.4; hct 33.9; mcv 84.4; plt 369; glucose  98; bun 10.3; creat 0.52; k+3.9; na++144 03-13-14: wbc 12.3; hgb 9.6; hct 31.8; mcv 82.5 ;plt 229; glucose 81; bun 35.3;creat 0.75; k+4.0; na++146; liver norm albumin 3.4 03-17-14: right heel culture: staph aureus: septra ds  03-20-14: pre-albumin 12 05-13-14: wbc 10.8; hgb 10.7; hct 35.3; mcv 81.6; plt 290; glucose 153; bun 23.4; creat 0.68; k+4.3; na++145; liver normal albumin 3.8; pre-albumin 16        Review of Systems  Unable to perform ROS    Physical Exam Constitutional: No distress.  Frail   Neck: Neck supple. No JVD present. No thyromegaly present.  Cardiovascular: Normal rate, regular rhythm and intact distal pulses.   Respiratory: Effort normal and breath sounds normal. No respiratory distress. She has no wheezes.  GI: Soft. Bowel sounds are normal. She exhibits no distension. There is no tenderness.  Musculoskeletal: She exhibits no edema.  Is able to  move all extremities; is status post right femoral neck fracture Neurological: She is alert.  Skin: Skin is warm and dry. She is not diaphoretic. Right heel ulceration: 3.8 x 3.8 x 0.3 cm 75% granulating tissue;  25% yellow slough. The eschar area self debrided.     ASSESSMENT/ PLAN:  1. Right heel ulceration due to severe PAD: will continue current treatment  Will continue vicodin 5/325 mg every 6 hours and every 6 hours as needed; and will monitor her status.   2. Hypertension: will continue norvasc 10 mg daily; lisinopril 10 mg daily; lopressor 25 mg twice daily; will continue to monitor  3. Dementia: is presently without change in status; will continue aricept 5 mg daily and will monitor  4. CAD: no complaint of chest pain present; will continue asa 325 mg daily and will monitor   5. GERD: will continue carafate 1 gm four times daily;   6. Anemia: will continue iron daily; hgb is 10.7  7. Constipation: will continue senna s nightly   8. Weight loss: her current weight is 116 pounds; the fosamax and lipitor was  stopped supplements per facility protocol         Ok Edwards NP [facility]  Contact [phone] Monday through Friday 8am- 5pm  After hours call [phone]

## 2014-06-30 NOTE — Progress Notes (Signed)
Patient ID: [name], female   DOB: [birth year], 81 y.o.   MRN: [id number]  [facility]     Allergies  Allergen Reactions  . Captopril-Hydrochlorothiazide Other (See Comments)    Reaction unknown  . Niacin And Related Other (See Comments)    Reaction unknown  . Urispas [Flavoxate] Other (See Comments)    Reaction unknown  . Verapamil Other (See Comments)    Reaction unknown       Chief Complaint  Patient presents with  . Acute Visit    wound management     HPI:  Her right heel ulceration continues to have a foul odor with small amount of drainage present. The heel continues to be inflamed. She is presently on doxycycline day 4 of 20 without improvement of her wound. She will require different treatment.    Past Medical History  Diagnosis Date  . Diverticulosis of colon (without mention of hemorrhage) 06/24/2006  . Vitamin B12 deficiency   . Alzheimer's disease   . HTN (hypertension)   . Depression   . PVD (peripheral vascular disease)   . CAD (coronary artery disease)   . GERD (gastroesophageal reflux disease)   . H. pylori infection 2004  . Presbyesophagus   . Iron deficiency anemia, unspecified 01/02/2012  . Personal history of fall 01/04/2009  . Unspecified constipation   . Hemorrhage of rectum and anus   . Chest pain, unspecified   . Acute pharyngitis   . Edema   . Cataract   . HLD (hyperlipidemia)   . Osteoporosis, unspecified   . Carcinoma of cecum 1992    History of  . Malignant neoplasm of colon, unspecified site     Past Surgical History  Procedure Laterality Date  . Gallbladder surgery    . Abdominal hysterectomy  1995    with bso  . Right colectomy    . Cataract extraction  2001  . Cholecystectomy Right   . Hip arthroplasty Right 12/15/2013    Procedure: HEMIARTHROPLASTY RIGHTHIP;  Surgeon: Newt Minion, MD;  Location: [facility];  Service: Orthopedics;  Laterality: Right;    VITAL SIGNS BP 126/64 mmHg  Pulse 75  Ht 4\' 10"  (1.473 m)   Wt 113 lb (51.256 kg)  BMI 23.62 kg/m2   Outpatient Encounter Prescriptions as of 06/03/2014  Medication Sig  . acetaminophen (TYLENOL) 500 MG tablet Take 1,000 mg by mouth every 8 (eight) hours as needed for mild pain.  Marland Kitchen amLODipine (NORVASC) 10 MG tablet Take 10 mg by mouth daily. For hypertension  . aspirin EC 325 MG tablet Take 1 tablet (325 mg total) by mouth daily.  . Calcium Carbonate-Vitamin D (CALCIUM-VITAMIN D) 500-200 MG-UNIT per tablet Take 1 tablet by mouth daily. f  . Cholecalciferol (VITAMIN D) 2000 UNITS tablet Take 2,000 Units by mouth daily. For osteoporosis  . divalproex (DEPAKOTE SPRINKLE) 125 MG capsule Take 125 mg by mouth 2 (two) times daily.   Marland Kitchen donepezil (ARICEPT) 10 MG tablet Take 0.5 tablets (5 mg total) by mouth daily. For alzheimers disease  . DULoxetine (CYMBALTA) 20 MG capsule Take 20 mg by mouth daily. At 2pm  . DULoxetine (CYMBALTA) 60 MG capsule Take 60 mg by mouth daily. Morning  . ferrous sulfate 325 (65 FE) MG tablet Take 325 mg by mouth daily with breakfast.  . HYDROcodone-acetaminophen (NORCO) 5-325 MG per tablet Take one tablet by mouth every 6 hours for pain. Not to exceed 3000mg  of APAP from all sources/24hr  . ipratropium-albuterol (DUONEB) 0.5-2.5 (3)  MG/3ML SOLN Take 3 mLs by nebulization every 6 (six) hours as needed (shortness of breath.).  Marland Kitchen lisinopril (PRINIVIL,ZESTRIL) 10 MG tablet Take 10 mg by mouth daily.  . metoprolol (LOPRESSOR) 25 MG tablet Take 1 tablet (25 mg total) by mouth 2 (two) times daily. If pulse <50 or SBP <100 hold med and notify MD.  . Multiple Vitamin (MULTIVITAMIN WITH MINERALS) TABS tablet Take 1 tablet by mouth daily.  . Nutritional Supplements (TWOCAL HN) LIQD Take 90 mLs by mouth 3 (three) times daily.   . sennosides-docusate sodium (SENOKOT-S) 8.6-50 MG tablet Take 1 tablet by mouth daily.  . sucralfate (CARAFATE) 1 GM/10ML suspension Take 1 g by mouth 4 (four) times daily -  before meals and at bedtime.  . vitamin  B-12 (CYANOCOBALAMIN) 1000 MCG tablet Take 1,000 mcg by mouth every other day.  . AMBULATORY NON FORMULARY MEDICATION Ativan 0.05% PLO Sig: Apply 0.5mg  to arms topically every 6 hours as needed for anxiety      SIGNIFICANT DIAGNOSTIC EXAMS   12-14-13: right hip x-ray: 1. Acute, impacted right femoral neck fracture. The distal fracture fragment is anteriorly displaced approximately 2 cm on the cross-table lateral view.  2. Decreased bony mineralization.  3. Cannot exclude an L3 compression fracture. No prior images of the lumbar spine available.  4. Prominent amount of stool in the rectum.  12-14-13: chest x-ray: Right basilar atelectasis.  01-27-14: chest x-ray: density right lung base suggestive of pneumonia possible associated effusion.   01-29-14: chest x-ray: right lower lobe plate like atelectasis in comparison no significant change. Atherosclerotic aorta. Moderate demineralization     03-13-14: chest x-ray: no acute cardiopulmonary disease process. Chronic interstial   05-13-14: right foot x-ray: no acute osseous abnormality    LABS REVIEWED:   12-07-13: urine culture: e-coli: zosyn 12-10-13: wbc 6.5; hgb 10.8; hct 35.4; mcv 91.7; ;plt 223; glucose 137; bun 18.2; creat 0.62; k+4.2; na++142 12-12-13: chol 180; ldl 102; trig 170; liver normal albumin 4.2 12-14-13: wbc 12.4; hgb 11.7; hct 35.6; mcv 89.9; plt 239; glucose 111; bun 32; creat 0.84; k+4.1 ;n++142; urine culture: no growth  12-17-13: wbc 11.7; hgb 8.7; hct 26.9; mcv 90.9; plt 199; glucose 90; bun 21; creat 0.7; k+3.9; na++146; liver normal albumin 2.6  12-18-13: glucose 93; bun 16.9; creat 0.53; k+3.9; na++145 12-22-13: wbc 9.0; hgb 7.8; hct 25.7; mcv 92.2 ;plt 346 12-24-13: glucose 78; bun 17.5; creat 0.55; k+4.1; na++143  01-29-14: wbc 8.6; hgb 10.4; hct 33.9; mcv 84.4; plt 369; glucose 98; bun 10.3; creat 0.52; k+3.9; na++144 03-13-14: wbc 12.3; hgb 9.6; hct 31.8; mcv 82.5 ;plt 229; glucose 81; bun 35.3;creat 0.75; k+4.0;  na++146; liver norm albumin 3.4 03-17-14: right heel culture: staph aureus: septra ds  03-20-14: pre-albumin 12 05-13-14: wbc 10.8; hgb 10.7; hct 35.3; mcv 81.6; plt 290; glucose 153; bun 23.4; creat 0.68; k+4.3; na++145; liver normal albumin 3.8; pre-albumin 16     ROS Unable to perform ROS     Physical Exam Constitutional: No distress.  Frail   Neck: Neck supple. No JVD present. No thyromegaly present.  Cardiovascular: Normal rate, regular rhythm and intact distal pulses.   Respiratory: Effort normal and breath sounds normal. No respiratory distress. She has no wheezes.  GI: Soft. Bowel sounds are normal. She exhibits no distension. There is no tenderness.  Musculoskeletal: She exhibits no edema.  Is able to move all extremities; is status post right femoral neck fracture Neurological: She is alert.  Skin: Skin is warm and dry.  She is not diaphoretic. Right heel ulceration: 4.0 x 4.0 cm with eschar present; softness underneath; foul odor present with no drainage present. Is inflamed.       ASSESSMENT/ PLAN:   1. Right heel ulceration: will x-ray right foot and heel. Will stop the doxycycline will begin clindamycin 300 mg every 6 hours for 2 weeks and will use florastor twice daily for 2 months. Will get abi of right leg and will continue to monitor her status   Time spent patient 25 minutes   Ok Edwards NP [facility]  Contact [phone] Monday through Friday 8am- 5pm  After hours call [phone]

## 2014-07-12 ENCOUNTER — Other Ambulatory Visit: Payer: Self-pay | Admitting: *Deleted

## 2014-07-12 MED ORDER — HYDROCODONE-ACETAMINOPHEN 5-325 MG PO TABS: ORAL_TABLET | ORAL | Status: DC

## 2014-07-12 NOTE — Telephone Encounter (Signed)
[facility] 

## 2014-07-20 DIAGNOSIS — B351 Tinea unguium: Secondary | ICD-10-CM | POA: Diagnosis not present

## 2014-07-20 DIAGNOSIS — M810 Age-related osteoporosis without current pathological fracture: Secondary | ICD-10-CM | POA: Diagnosis not present

## 2014-07-20 DIAGNOSIS — M79674 Pain in right toe(s): Secondary | ICD-10-CM | POA: Diagnosis not present

## 2014-07-20 DIAGNOSIS — M79675 Pain in left toe(s): Secondary | ICD-10-CM | POA: Diagnosis not present

## 2014-07-23 ENCOUNTER — Encounter: Payer: Self-pay | Admitting: Internal Medicine

## 2014-07-27 ENCOUNTER — Non-Acute Institutional Stay (SKILLED_NURSING_FACILITY): Payer: Medicare Other | Admitting: Adult Health

## 2014-07-27 ENCOUNTER — Encounter: Payer: Self-pay | Admitting: Adult Health

## 2014-07-27 DIAGNOSIS — K219 Gastro-esophageal reflux disease without esophagitis: Secondary | ICD-10-CM | POA: Diagnosis not present

## 2014-07-27 DIAGNOSIS — G301 Alzheimer's disease with late onset: Secondary | ICD-10-CM

## 2014-07-27 DIAGNOSIS — R634 Abnormal weight loss: Secondary | ICD-10-CM

## 2014-07-27 DIAGNOSIS — L97413 Non-pressure chronic ulcer of right heel and midfoot with necrosis of muscle: Secondary | ICD-10-CM | POA: Diagnosis not present

## 2014-07-27 DIAGNOSIS — I2510 Atherosclerotic heart disease of native coronary artery without angina pectoris: Secondary | ICD-10-CM | POA: Diagnosis not present

## 2014-07-27 DIAGNOSIS — D509 Iron deficiency anemia, unspecified: Secondary | ICD-10-CM | POA: Diagnosis not present

## 2014-07-27 DIAGNOSIS — F0281 Dementia in other diseases classified elsewhere with behavioral disturbance: Secondary | ICD-10-CM | POA: Diagnosis not present

## 2014-07-27 DIAGNOSIS — F22 Delusional disorders: Secondary | ICD-10-CM

## 2014-07-27 DIAGNOSIS — I10 Essential (primary) hypertension: Secondary | ICD-10-CM

## 2014-07-27 DIAGNOSIS — F02818 Dementia in other diseases classified elsewhere, unspecified severity, with other behavioral disturbance: Secondary | ICD-10-CM

## 2014-07-27 DIAGNOSIS — G309 Alzheimer's disease, unspecified: Secondary | ICD-10-CM | POA: Diagnosis not present

## 2014-07-27 DIAGNOSIS — I739 Peripheral vascular disease, unspecified: Secondary | ICD-10-CM

## 2014-07-27 NOTE — Progress Notes (Signed)
Patient ID: [name], female   DOB: [birth year], 81 y.o.   MRN: [id number]  [facility]     Allergies  Allergen Reactions  . Captopril-Hydrochlorothiazide Other (See Comments)    Reaction unknown  . Niacin And Related Other (See Comments)    Reaction unknown  . Urispas [Flavoxate] Other (See Comments)    Reaction unknown  . Verapamil Other (See Comments)    Reaction unknown       Chief Complaint  Patient presents with  . Medical Management of Chronic Issues    HPI:  She is a long term resident of this facility being seen for the management of her chronic illnesses. Overall her status is without significant change. She has a resolving bruise on her right breast. Her right heel ulceration continues to improve. She is unable to fully participate in the hpi or ros.     Past Medical History  Diagnosis Date  . Diverticulosis of colon (without mention of hemorrhage) 06/24/2006  . Vitamin B12 deficiency   . Alzheimer's disease   . HTN (hypertension)   . Depression   . PVD (peripheral vascular disease)   . CAD (coronary artery disease)   . GERD (gastroesophageal reflux disease)   . H. pylori infection 2004  . Presbyesophagus   . Iron deficiency anemia, unspecified 01/02/2012  . Personal history of fall 01/04/2009  . Unspecified constipation   . Hemorrhage of rectum and anus   . Chest pain, unspecified   . Acute pharyngitis   . Edema   . Cataract   . HLD (hyperlipidemia)   . Osteoporosis, unspecified   . Carcinoma of cecum 1992    History of  . Malignant neoplasm of colon, unspecified site     Past Surgical History  Procedure Laterality Date  . Gallbladder surgery    . Abdominal hysterectomy  1995    with bso  . Right colectomy    . Cataract extraction  2001  . Cholecystectomy Right   . Hip arthroplasty Right 12/15/2013    Procedure: HEMIARTHROPLASTY RIGHTHIP;  Surgeon: Newt Minion, MD;  Location: [facility];  Service: Orthopedics;  Laterality: Right;     VITAL SIGNS BP 116/64 mmHg  Pulse 68  Ht 4\' 10"  (1.473 m)  Wt 117 lb (53.071 kg)  BMI 24.46 kg/m2  SpO2 97%   Outpatient Encounter Prescriptions as of 07/27/2014  Medication Sig  . acetaminophen (TYLENOL) 500 MG tablet Take 1,000 mg by mouth every 8 (eight) hours as needed for mild pain.  . Amino Acids-Protein Hydrolys (FEEDING SUPPLEMENT, PRO-STAT SUGAR FREE 64,) LIQD Take 30 mLs by mouth 3 (three) times daily with meals.  Marland Kitchen amLODipine (NORVASC) 10 MG tablet Take 10 mg by mouth daily. For hypertension  . aspirin EC 325 MG tablet Take 1 tablet (325 mg total) by mouth daily.  . Calcium Carbonate-Vitamin D (CALCIUM-VITAMIN D) 500-200 MG-UNIT per tablet Take 1 tablet by mouth daily. f  . Cholecalciferol (VITAMIN D) 2000 UNITS tablet Take 2,000 Units by mouth daily. For osteoporosis  . donepezil (ARICEPT) 10 MG tablet Take 0.5 tablets (5 mg total) by mouth daily. For alzheimers disease  . ferrous sulfate 325 (65 FE) MG tablet Take 325 mg by mouth daily with breakfast.  . HYDROcodone-acetaminophen (NORCO) 5-325 MG per tablet Take one tablet by mouth every 6 hours for pain.: AND every 6 hours as needed for pain)  . ipratropium-albuterol (DUONEB) 0.5-2.5 (3) MG/3ML SOLN Take 3 mLs by nebulization every 6 (six) hours as needed (  shortness of breath.).  Marland Kitchen lisinopril (PRINIVIL,ZESTRIL) 10 MG tablet Take 10 mg by mouth daily.  . metoprolol (LOPRESSOR) 25 MG tablet Take 1 tablet (25 mg total) by mouth 2 (two) times daily. If pulse <50 or SBP <100 hold med and notify MD.  . Multiple Vitamin (MULTIVITAMIN WITH MINERALS) TABS tablet Take 1 tablet by mouth daily.  . Nutritional Supplements (TWOCAL HN) LIQD Take 120 mLs by mouth 3 (three) times daily.   . sennosides-docusate sodium (SENOKOT-S) 8.6-50 MG tablet Take 1 tablet by mouth daily.  . sucralfate (CARAFATE) 1 GM/10ML suspension Take 1 g by mouth 4 (four) times daily -  before meals and at bedtime.  . vitamin B-12 (CYANOCOBALAMIN) 1000 MCG tablet  Take 1,000 mcg by mouth every other day.      SIGNIFICANT DIAGNOSTIC EXAMS  12-14-13: right hip x-ray: 1. Acute, impacted right femoral neck fracture. The distal fracture fragment is anteriorly displaced approximately 2 cm on the cross-table lateral view.  2. Decreased bony mineralization.  3. Cannot exclude an L3 compression fracture. No prior images of the lumbar spine available.  4. Prominent amount of stool in the rectum.  12-14-13: chest x-ray: Right basilar atelectasis.  01-27-14: chest x-ray: density right lung base suggestive of pneumonia possible associated effusion.   01-29-14: chest x-ray: right lower lobe plate like atelectasis in comparison no significant change. Atherosclerotic aorta. Moderate demineralization     03-13-14: chest x-ray: no acute cardiopulmonary disease process. Chronic interstial   05-13-14: right foot x-ray: no acute osseous abnormality  06-04-14; right ABI: moderate plaque right superficial femoral artery stenosis; infrapopliteal arterial occlusion. Right leg changes are in the severe ischemia range.      LABS REVIEWED:   12-07-13: urine culture: e-coli: zosyn 12-10-13: wbc 6.5; hgb 10.8; hct 35.4; mcv 91.7; ;plt 223; glucose 137; bun 18.2; creat 0.62; k+4.2; na++142 12-12-13: chol 180; ldl 102; trig 170; liver normal albumin 4.2 12-14-13: wbc 12.4; hgb 11.7; hct 35.6; mcv 89.9; plt 239; glucose 111; bun 32; creat 0.84; k+4.1 ;n++142; urine culture: no growth  12-17-13: wbc 11.7; hgb 8.7; hct 26.9; mcv 90.9; plt 199; glucose 90; bun 21; creat 0.7; k+3.9; na++146; liver normal albumin 2.6  12-18-13: glucose 93; bun 16.9; creat 0.53; k+3.9; na++145 12-22-13: wbc 9.0; hgb 7.8; hct 25.7; mcv 92.2 ;plt 346 12-24-13: glucose 78; bun 17.5; creat 0.55; k+4.1; na++143  01-29-14: wbc 8.6; hgb 10.4; hct 33.9; mcv 84.4; plt 369; glucose 98; bun 10.3; creat 0.52; k+3.9; na++144 03-13-14: wbc 12.3; hgb 9.6; hct 31.8; mcv 82.5 ;plt 229; glucose 81; bun 35.3;creat 0.75; k+4.0;  na++146; liver norm albumin 3.4 03-17-14: right heel culture: staph aureus: septra ds  03-20-14: pre-albumin 12 05-13-14: wbc 10.8; hgb 10.7; hct 35.3; mcv 81.6; plt 290; glucose 153; bun 23.4; creat 0.68; k+4.3; na++145; liver normal albumin 3.8; pre-albumin 16      Review of Systems  Unable to perform ROS    Physical Exam Constitutional: No distress.  Frail   Neck: Neck supple. No JVD present. No thyromegaly present.  Cardiovascular: Normal rate, regular rhythm and intact distal pulses.   Respiratory: Effort normal and breath sounds normal. No respiratory distress. She has no wheezes.  GI: Soft. Bowel sounds are normal. She exhibits no distension. There is no tenderness.  Musculoskeletal: She exhibits no edema.  Is able to move all extremities; is status post right femoral neck fracture Neurological: She is alert.  Skin: Skin is warm and dry. She is not diaphoretic. Right heel ulceration: 3.8  x 3.8 x 0.3 cm 75% granulating tissue;  25% yellow slough. The eschar area self debrided.       ASSESSMENT/ PLAN:  1. Right heel ulceration due to severe PAD: will continue current treatment  Will continue vicodin 5/325 mg every 6 hours and every 6 hours as needed; and will monitor her status. vein and vascular appointment is pending  2. Hypertension: will continue norvasc 10 mg daily; lisinopril 10 mg daily; lopressor 25 mg twice daily; will continue to monitor  3. Dementia: is presently without change in status; will continue aricept 5 mg daily and will monitor  4. CAD: no complaint of chest pain present; will continue asa 325 mg daily and will monitor   5. GERD: will continue carafate 1 gm four times daily;   6. Anemia: will continue iron daily; hgb is 10.7  7. Constipation: will continue senna s nightly   8. Weight loss: her current weight is 117 pounds; she continues with supplements and prostat per facility protocol. Her weight at this time is presently stable.  Her  pre-albumin is 16     Will stop her prn tylenol to prevent exceeding 3000 mg daily     Ok Edwards NP [facility]  Contact [phone] Monday through Friday 8am- 5pm  After hours call [phone]

## 2014-07-28 ENCOUNTER — Non-Acute Institutional Stay (SKILLED_NURSING_FACILITY): Payer: Medicare Other | Admitting: Adult Health

## 2014-07-28 DIAGNOSIS — L97412 Non-pressure chronic ulcer of right heel and midfoot with fat layer exposed: Secondary | ICD-10-CM | POA: Diagnosis not present

## 2014-08-03 ENCOUNTER — Other Ambulatory Visit: Payer: Self-pay | Admitting: *Deleted

## 2014-08-03 DIAGNOSIS — R293 Abnormal posture: Secondary | ICD-10-CM | POA: Diagnosis not present

## 2014-08-03 MED ORDER — HYDROCODONE-ACETAMINOPHEN 5-325 MG PO TABS: ORAL_TABLET | ORAL | Status: DC

## 2014-08-03 NOTE — Telephone Encounter (Signed)
[facility]

## 2014-08-04 DIAGNOSIS — R293 Abnormal posture: Secondary | ICD-10-CM | POA: Diagnosis not present

## 2014-08-05 DIAGNOSIS — R293 Abnormal posture: Secondary | ICD-10-CM | POA: Diagnosis not present

## 2014-08-06 DIAGNOSIS — R293 Abnormal posture: Secondary | ICD-10-CM | POA: Diagnosis not present

## 2014-08-09 DIAGNOSIS — R293 Abnormal posture: Secondary | ICD-10-CM | POA: Diagnosis not present

## 2014-08-10 DIAGNOSIS — R293 Abnormal posture: Secondary | ICD-10-CM | POA: Diagnosis not present

## 2014-08-11 DIAGNOSIS — R293 Abnormal posture: Secondary | ICD-10-CM | POA: Diagnosis not present

## 2014-08-12 DIAGNOSIS — R293 Abnormal posture: Secondary | ICD-10-CM | POA: Diagnosis not present

## 2014-08-13 DIAGNOSIS — R293 Abnormal posture: Secondary | ICD-10-CM | POA: Diagnosis not present

## 2014-08-16 DIAGNOSIS — R293 Abnormal posture: Secondary | ICD-10-CM | POA: Diagnosis not present

## 2014-08-17 DIAGNOSIS — R293 Abnormal posture: Secondary | ICD-10-CM | POA: Diagnosis not present

## 2014-08-18 DIAGNOSIS — R293 Abnormal posture: Secondary | ICD-10-CM | POA: Diagnosis not present

## 2014-08-19 DIAGNOSIS — R293 Abnormal posture: Secondary | ICD-10-CM | POA: Diagnosis not present

## 2014-08-26 ENCOUNTER — Encounter: Payer: Self-pay | Admitting: Internal Medicine

## 2014-08-26 ENCOUNTER — Non-Acute Institutional Stay (SKILLED_NURSING_FACILITY): Payer: Medicare Other | Admitting: Internal Medicine

## 2014-08-26 DIAGNOSIS — F329 Major depressive disorder, single episode, unspecified: Secondary | ICD-10-CM

## 2014-08-26 DIAGNOSIS — G309 Alzheimer's disease, unspecified: Secondary | ICD-10-CM

## 2014-08-26 DIAGNOSIS — M159 Polyosteoarthritis, unspecified: Secondary | ICD-10-CM

## 2014-08-26 DIAGNOSIS — M150 Primary generalized (osteo)arthritis: Secondary | ICD-10-CM | POA: Diagnosis not present

## 2014-08-26 DIAGNOSIS — F22 Delusional disorders: Secondary | ICD-10-CM

## 2014-08-26 DIAGNOSIS — I739 Peripheral vascular disease, unspecified: Secondary | ICD-10-CM

## 2014-08-26 DIAGNOSIS — F0281 Dementia in other diseases classified elsewhere with behavioral disturbance: Secondary | ICD-10-CM

## 2014-08-26 DIAGNOSIS — R1310 Dysphagia, unspecified: Secondary | ICD-10-CM

## 2014-08-26 DIAGNOSIS — G301 Alzheimer's disease with late onset: Secondary | ICD-10-CM

## 2014-08-26 DIAGNOSIS — D509 Iron deficiency anemia, unspecified: Secondary | ICD-10-CM

## 2014-08-26 DIAGNOSIS — G4700 Insomnia, unspecified: Secondary | ICD-10-CM | POA: Diagnosis not present

## 2014-08-26 DIAGNOSIS — I10 Essential (primary) hypertension: Secondary | ICD-10-CM | POA: Diagnosis not present

## 2014-08-26 DIAGNOSIS — F32A Depression, unspecified: Secondary | ICD-10-CM

## 2014-08-26 NOTE — Progress Notes (Signed)
Patient ID: [name], female   DOB: 10-Dec-1922, 81 y.o.   MRN: [id number]    DATE:08/26/14  Location:  [place] of Service: [phone])   Extended Emergency Contact Information Primary Emergency Contact: [name] [place] Mobile Phone: [phone] Relation: Son Secondary Emergency Contact: [name],[name] Address: [address]          [place], [place] [address] [place] Phone: [phone] Relation: Other  Advanced Directive information  DNR  Chief Complaint  Patient presents with  . Medical Management of Chronic Issues    HPI:  81 yo female long term resident seen today for f/u. Nursing c/a pt not sleeping x 24-48 hrs. remeron stopped several weeks ago after she gained weight and appetite improved. Psych saw pt and recommended resuming remeron to help her sleep. No other issues. No falls. She will see vascular sx next week for PAD. She is in restorative feeding and doing well with meals.  Pt is a poor historian. Hx obtained from chart  BP controlled on metoprolol, amlodipine and lisinopril. She takes ASA daily.  She takes norco prn pain  Dementia stable on aricept.  carafate helps GERD sx's  She takes nutritional supplements and vitamins  Past Medical History  Diagnosis Date  . Diverticulosis of colon (without mention of hemorrhage) 06/24/2006  . Vitamin B12 deficiency   . Alzheimer's disease   . HTN (hypertension)   . Depression   . PVD (peripheral vascular disease)   . CAD (coronary artery disease)   . GERD (gastroesophageal reflux disease)   . H. pylori infection 2004  . Presbyesophagus   . Iron deficiency anemia, unspecified 01/02/2012  . Personal history of fall 01/04/2009  . Unspecified constipation   . Hemorrhage of rectum and anus   . Chest pain, unspecified   . Acute pharyngitis   . Edema   . Cataract   . HLD (hyperlipidemia)   . Osteoporosis, unspecified   . Carcinoma of cecum  1992    History of  . Malignant neoplasm of colon, unspecified site     Past Surgical History  Procedure Laterality Date  . Gallbladder surgery    . Abdominal hysterectomy  1995    with bso  . Right colectomy    . Cataract extraction  2001  . Cholecystectomy Right   . Hip arthroplasty Right 12/15/2013    Procedure: HEMIARTHROPLASTY RIGHTHIP;  Surgeon: Newt Minion, MD;  Location: [facility];  Service: Orthopedics;  Laterality: Right;    Patient Care Team: Gildardo Cranker, DO as PCP - General (Internal Medicine) Gerlene Fee, NP as Nurse Practitioner (Nurse Practitioner)  History   Social History  . Marital Status: Widowed    Spouse Name: N/A  . Number of Children: 2  . Years of Education: N/A   Occupational History  . Retired    Social History Main Topics  . Smoking status: Former Smoker    Types: Cigarettes  . Smokeless tobacco: Never Used  . Alcohol Use: No  . Drug Use: No  . Sexual Activity: No   Other Topics Concern  . Not on file   Social History Narrative     reports that she has quit smoking. Her smoking use included Cigarettes. She has never used smokeless tobacco. She reports that she does not drink alcohol or use illicit drugs.  Immunization History  Administered Date(s) Administered  . Influenza Whole 10/22/2004, 10/30/2012  . Influenza-Unspecified 11/02/2013  .  PPD Test 08/03/2009  . Pneumococcal Polysaccharide-23 01/23/1999  . Pneumococcal-Unspecified 04/07/2012    Allergies  Allergen Reactions  . Captopril-Hydrochlorothiazide Other (See Comments)    Reaction unknown  . Niacin And Related Other (See Comments)    Reaction unknown  . Urispas [Flavoxate] Other (See Comments)    Reaction unknown  . Verapamil Other (See Comments)    Reaction unknown    Medications: Patient's Medications  New Prescriptions   No medications on file  Previous Medications   ACETAMINOPHEN (TYLENOL) 500 MG TABLET    Take 1,000 mg by mouth every 8 (eight) hours  as needed for mild pain.   AMINO ACIDS-PROTEIN HYDROLYS (FEEDING SUPPLEMENT, PRO-STAT SUGAR FREE 64,) LIQD    Take 30 mLs by mouth 3 (three) times daily with meals.   AMLODIPINE (NORVASC) 10 MG TABLET    Take 10 mg by mouth daily. For hypertension   ASPIRIN EC 325 MG TABLET    Take 1 tablet (325 mg total) by mouth daily.   CALCIUM CARBONATE-VITAMIN D (CALCIUM-VITAMIN D) 500-200 MG-UNIT PER TABLET    Take 1 tablet by mouth daily. f   CHOLECALCIFEROL (VITAMIN D) 2000 UNITS TABLET    Take 2,000 Units by mouth daily. For osteoporosis   DONEPEZIL (ARICEPT) 10 MG TABLET    Take 0.5 tablets (5 mg total) by mouth daily. For alzheimers disease   FERROUS SULFATE 325 (65 FE) MG TABLET    Take 325 mg by mouth daily with breakfast.   HYDROCODONE-ACETAMINOPHEN (NORCO) 5-325 MG PER TABLET    Take one tablet by mouth every 6 hours as needed for pain. Not to exceed 3059m of APAP from all sources/24hr   IPRATROPIUM-ALBUTEROL (DUONEB) 0.5-2.5 (3) MG/3ML SOLN    Take 3 mLs by nebulization every 6 (six) hours as needed (shortness of breath.).   LISINOPRIL (PRINIVIL,ZESTRIL) 10 MG TABLET    Take 10 mg by mouth daily.   METOPROLOL (LOPRESSOR) 25 MG TABLET    Take 1 tablet (25 mg total) by mouth 2 (two) times daily. If pulse <50 or SBP <100 hold med and notify MD.   MULTIPLE VITAMIN (MULTIVITAMIN WITH MINERALS) TABS TABLET    Take 1 tablet by mouth daily.   NUTRITIONAL SUPPLEMENTS (TWOCAL HN) LIQD    Take 120 mLs by mouth 3 (three) times daily.    SENNOSIDES-DOCUSATE SODIUM (SENOKOT-S) 8.6-50 MG TABLET    Take 1 tablet by mouth daily.   SUCRALFATE (CARAFATE) 1 GM/10ML SUSPENSION    Take 1 g by mouth 4 (four) times daily -  before meals and at bedtime.   VITAMIN B-12 (CYANOCOBALAMIN) 1000 MCG TABLET    Take 1,000 mcg by mouth every other day.  Modified Medications   No medications on file  Discontinued Medications   No medications on file    Review of Systems  Unable to perform ROS: Dementia    Filed Vitals:    08/26/14 1637  BP: 120/84  Pulse: 70  Temp: 97.6 F (36.4 C)   There is no weight on file to calculate BMI.  Physical Exam  Constitutional: She appears well-developed.  Frail appearing in NAD. Sitting in w/c  HENT:  Mouth/Throat: Oropharynx is clear and moist. No oropharyngeal exudate.  Eyes: Pupils are equal, round, and reactive to light. No scleral icterus.  Neck: Neck supple. Carotid bruit is not present. No tracheal deviation present. No thyromegaly present.  Cardiovascular: Normal rate, regular rhythm and intact distal pulses.  Exam reveals no gallop and no friction rub.  Murmur (1/6 SEM) heard. No LE edema b/l. no calf TTP.   Pulmonary/Chest: Effort normal and breath sounds normal. No stridor. No respiratory distress. She has no wheezes. She has no rales.  Abdominal: Soft. Bowel sounds are normal. She exhibits no distension and no mass. There is no hepatomegaly. There is no tenderness. There is no rebound and no guarding.  Musculoskeletal: She exhibits edema and tenderness.  B/l LE prevalon boot intact and legs elevated  Lymphadenopathy:    She has no cervical adenopathy.  Neurological: She is alert.  Skin: Skin is warm and dry. No rash noted.  Psychiatric: She has a normal mood and affect. Her behavior is normal.     Labs reviewed: No visits with results within 3 Month(s) from this visit. Latest known visit with results is:  Admission on 12/14/2013, Discharged on 12/17/2013  Component Date Value Ref Range Status  . Sodium 12/14/2013 142  137 - 147 mEq/L Final  . Potassium 12/14/2013 4.1  3.7 - 5.3 mEq/L Final  . Chloride 12/14/2013 106  96 - 112 mEq/L Final  . CO2 12/14/2013 23  19 - 32 mEq/L Final  . Glucose, Bld 12/14/2013 111* 70 - 99 mg/dL Final  . BUN 12/14/2013 32* 6 - 23 mg/dL Final  . Creatinine, Ser 12/14/2013 0.84  0.50 - 1.10 mg/dL Final  . Calcium 12/14/2013 9.7  8.4 - 10.5 mg/dL Final  . GFR calc non Af Amer 12/14/2013 60* >90 mL/min Final  . GFR calc  Af Amer 12/14/2013 69* >90 mL/min Final   Comment: (NOTE) The eGFR has been calculated using the CKD EPI equation. This calculation has not been validated in all clinical situations. eGFR's persistently <90 mL/min signify possible Chronic Kidney Disease.   . Anion gap 12/14/2013 13  5 - 15 Final  . WBC 12/14/2013 12.4* 4.0 - 10.5 K/uL Final  . RBC 12/14/2013 3.96  3.87 - 5.11 MIL/uL Final  . Hemoglobin 12/14/2013 11.7* 12.0 - 15.0 g/dL Final  . HCT 12/14/2013 35.6* 36.0 - 46.0 % Final  . MCV 12/14/2013 89.9  78.0 - 100.0 fL Final  . MCH 12/14/2013 29.5  26.0 - 34.0 pg Final  . MCHC 12/14/2013 32.9  30.0 - 36.0 g/dL Final  . RDW 12/14/2013 13.3  11.5 - 15.5 % Final  . Platelets 12/14/2013 239  150 - 400 K/uL Final  . Neutrophils Relative % 12/14/2013 77  43 - 77 % Final  . Neutro Abs 12/14/2013 9.5* 1.7 - 7.7 K/uL Final  . Lymphocytes Relative 12/14/2013 14  12 - 46 % Final  . Lymphs Abs 12/14/2013 1.7  0.7 - 4.0 K/uL Final  . Monocytes Relative 12/14/2013 6  3 - 12 % Final  . Monocytes Absolute 12/14/2013 0.8  0.1 - 1.0 K/uL Final  . Eosinophils Relative 12/14/2013 3  0 - 5 % Final  . Eosinophils Absolute 12/14/2013 0.4  0.0 - 0.7 K/uL Final  . Basophils Relative 12/14/2013 0  0 - 1 % Final  . Basophils Absolute 12/14/2013 0.0  0.0 - 0.1 K/uL Final  . Prothrombin Time 12/14/2013 15.9* 11.6 - 15.2 seconds Final  . INR 12/14/2013 1.26  0.00 - 1.49 Final  . ABO/RH(D) 12/14/2013 O NEG   Final  . Antibody Screen 12/14/2013 NEG   Final  . Sample Expiration 12/14/2013 12/17/2013   Final  . Color, Urine 12/14/2013 YELLOW  YELLOW Final  . APPearance 12/14/2013 CLEAR  CLEAR Final  . Specific Gravity, Urine 12/14/2013 1.024  1.005 -  1.030 Final  . pH 12/14/2013 5.5  5.0 - 8.0 Final  . Glucose, UA 12/14/2013 NEGATIVE  NEGATIVE mg/dL Final  . Hgb urine dipstick 12/14/2013 TRACE* NEGATIVE Final  . Bilirubin Urine 12/14/2013 NEGATIVE  NEGATIVE Final  . Ketones, ur 12/14/2013 NEGATIVE   NEGATIVE mg/dL Final  . Protein, ur 12/14/2013 NEGATIVE  NEGATIVE mg/dL Final  . Urobilinogen, UA 12/14/2013 0.2  0.0 - 1.0 mg/dL Final  . Nitrite 12/14/2013 NEGATIVE  NEGATIVE Final  . Leukocytes, UA 12/14/2013 NEGATIVE  NEGATIVE Final  . Squamous Epithelial / LPF 12/14/2013 RARE  RARE Final  . WBC, UA 12/14/2013 3-6  <3 WBC/hpf Final  . RBC / HPF 12/14/2013 0-2  <3 RBC/hpf Final  . ABO/RH(D) 12/14/2013 O NEG   Final  . WBC 12/14/2013 11.2* 4.0 - 10.5 K/uL Final  . RBC 12/14/2013 4.01  3.87 - 5.11 MIL/uL Final  . Hemoglobin 12/14/2013 11.7* 12.0 - 15.0 g/dL Final  . HCT 12/14/2013 35.6* 36.0 - 46.0 % Final  . MCV 12/14/2013 88.8  78.0 - 100.0 fL Final  . MCH 12/14/2013 29.2  26.0 - 34.0 pg Final  . MCHC 12/14/2013 32.9  30.0 - 36.0 g/dL Final  . RDW 12/14/2013 13.3  11.5 - 15.5 % Final  . Platelets 12/14/2013 234  150 - 400 K/uL Final  . Creatinine, Ser 12/14/2013 0.79  0.50 - 1.10 mg/dL Final  . GFR calc non Af Amer 12/14/2013 72* >90 mL/min Final  . GFR calc Af Amer 12/14/2013 83* >90 mL/min Final   Comment: (NOTE) The eGFR has been calculated using the CKD EPI equation. This calculation has not been validated in all clinical situations. eGFR's persistently <90 mL/min signify possible Chronic Kidney Disease.   . Valproic Acid Lvl 12/14/2013 <10.0* 50.0 - 100.0 ug/mL Final   Performed at [facility]  . Specimen Description 12/14/2013 URINE, CATHETERIZED   Final  . Special Requests 12/14/2013 NONE   Final  . Culture  Setup Time 12/14/2013    Final                   Value:12/15/2013 06:19 Performed at [facility]   . Colony Count 12/14/2013    Final                   Value:NO GROWTH Performed at [facility]   . Culture 12/14/2013    Final                   Value:NO GROWTH Performed at [facility]   . Report Status 12/14/2013 12/16/2013 FINAL   Final  . Magnesium 12/15/2013 2.1  1.5 - 2.5 mg/dL Final  . MRSA, PCR 12/15/2013 NEGATIVE   NEGATIVE Final  . Staphylococcus aureus 12/15/2013 NEGATIVE  NEGATIVE Final   Comment:        The Xpert SA Assay (FDA approved for NASAL specimens in patients over 87 years of age), is one component of a comprehensive surveillance program.  Test performance has been validated by [employer] for patients greater than or equal to 32 year old. It is not intended to diagnose infection nor to guide or monitor treatment.   . Sodium 12/16/2013 145  137 - 147 mEq/L Final  . Potassium 12/16/2013 3.9  3.7 - 5.3 mEq/L Final  . Chloride 12/16/2013 109  96 - 112 mEq/L Final  . CO2 12/16/2013 23  19 - 32 mEq/L Final  . Glucose, Bld 12/16/2013 102* 70 - 99  mg/dL Final  . BUN 12/16/2013 20  6 - 23 mg/dL Final  . Creatinine, Ser 12/16/2013 0.64  0.50 - 1.10 mg/dL Final  . Calcium 12/16/2013 8.8  8.4 - 10.5 mg/dL Final  . Total Protein 12/16/2013 5.6* 6.0 - 8.3 g/dL Final  . Albumin 12/16/2013 2.6* 3.5 - 5.2 g/dL Final  . AST 12/16/2013 16  0 - 37 U/L Final  . ALT 12/16/2013 10  0 - 35 U/L Final  . Alkaline Phosphatase 12/16/2013 49  39 - 117 U/L Final  . Total Bilirubin 12/16/2013 0.7  0.3 - 1.2 mg/dL Final  . GFR calc non Af Amer 12/16/2013 77* >90 mL/min Final  . GFR calc Af Amer 12/16/2013 89* >90 mL/min Final   Comment: (NOTE) The eGFR has been calculated using the CKD EPI equation. This calculation has not been validated in all clinical situations. eGFR's persistently <90 mL/min signify possible Chronic Kidney Disease.   . Anion gap 12/16/2013 13  5 - 15 Final  . WBC 12/16/2013 9.9  4.0 - 10.5 K/uL Final  . RBC 12/16/2013 3.17* 3.87 - 5.11 MIL/uL Final  . Hemoglobin 12/16/2013 9.0* 12.0 - 15.0 g/dL Final  . HCT 12/16/2013 28.5* 36.0 - 46.0 % Final  . MCV 12/16/2013 89.9  78.0 - 100.0 fL Final  . MCH 12/16/2013 28.4  26.0 - 34.0 pg Final  . MCHC 12/16/2013 31.6  30.0 - 36.0 g/dL Final  . RDW 12/16/2013 13.5  11.5 - 15.5 % Final  . Platelets 12/16/2013 202  150 - 400 K/uL Final  .  Neutrophils Relative % 12/16/2013 77  43 - 77 % Final  . Neutro Abs 12/16/2013 7.6  1.7 - 7.7 K/uL Final  . Lymphocytes Relative 12/16/2013 14  12 - 46 % Final  . Lymphs Abs 12/16/2013 1.4  0.7 - 4.0 K/uL Final  . Monocytes Relative 12/16/2013 8  3 - 12 % Final  . Monocytes Absolute 12/16/2013 0.8  0.1 - 1.0 K/uL Final  . Eosinophils Relative 12/16/2013 1  0 - 5 % Final  . Eosinophils Absolute 12/16/2013 0.1  0.0 - 0.7 K/uL Final  . Basophils Relative 12/16/2013 0  0 - 1 % Final  . Basophils Absolute 12/16/2013 0.0  0.0 - 0.1 K/uL Final  . Sodium 12/17/2013 146  137 - 147 mEq/L Final  . Potassium 12/17/2013 3.9  3.7 - 5.3 mEq/L Final  . Chloride 12/17/2013 108  96 - 112 mEq/L Final  . CO2 12/17/2013 26  19 - 32 mEq/L Final  . Glucose, Bld 12/17/2013 90  70 - 99 mg/dL Final  . BUN 12/17/2013 21  6 - 23 mg/dL Final  . Creatinine, Ser 12/17/2013 0.67  0.50 - 1.10 mg/dL Final  . Calcium 12/17/2013 8.8  8.4 - 10.5 mg/dL Final  . Total Protein 12/17/2013 5.8* 6.0 - 8.3 g/dL Final  . Albumin 12/17/2013 2.6* 3.5 - 5.2 g/dL Final  . AST 12/17/2013 16  0 - 37 U/L Final  . ALT 12/17/2013 10  0 - 35 U/L Final  . Alkaline Phosphatase 12/17/2013 60  39 - 117 U/L Final  . Total Bilirubin 12/17/2013 0.6  0.3 - 1.2 mg/dL Final  . GFR calc non Af Amer 12/17/2013 76* >90 mL/min Final  . GFR calc Af Amer 12/17/2013 88* >90 mL/min Final   Comment: (NOTE) The eGFR has been calculated using the CKD EPI equation. This calculation has not been validated in all clinical situations. eGFR's persistently <90 mL/min signify possible Chronic  Kidney Disease.   . Anion gap 12/17/2013 12  5 - 15 Final  . WBC 12/17/2013 11.7* 4.0 - 10.5 K/uL Final  . RBC 12/17/2013 2.96* 3.87 - 5.11 MIL/uL Final  . Hemoglobin 12/17/2013 8.7* 12.0 - 15.0 g/dL Final  . HCT 12/17/2013 26.9* 36.0 - 46.0 % Final  . MCV 12/17/2013 90.9  78.0 - 100.0 fL Final  . MCH 12/17/2013 29.4  26.0 - 34.0 pg Final  . MCHC 12/17/2013 32.3  30.0 -  36.0 g/dL Final  . RDW 12/17/2013 13.5  11.5 - 15.5 % Final  . Platelets 12/17/2013 199  150 - 400 K/uL Final  . Neutrophils Relative % 12/17/2013 75  43 - 77 % Final  . Neutro Abs 12/17/2013 8.8* 1.7 - 7.7 K/uL Final  . Lymphocytes Relative 12/17/2013 17  12 - 46 % Final  . Lymphs Abs 12/17/2013 2.0  0.7 - 4.0 K/uL Final  . Monocytes Relative 12/17/2013 7  3 - 12 % Final  . Monocytes Absolute 12/17/2013 0.8  0.1 - 1.0 K/uL Final  . Eosinophils Relative 12/17/2013 1  0 - 5 % Final  . Eosinophils Absolute 12/17/2013 0.1  0.0 - 0.7 K/uL Final  . Basophils Relative 12/17/2013 0  0 - 1 % Final  . Basophils Absolute 12/17/2013 0.0  0.0 - 0.1 K/uL Final    No results found.   Assessment/Plan   ICD-9-CM ICD-10-CM   1. Insomnia - new 780.52 G47.00   2. Dementia of the Alzheimer's type, with late onset, with delusions - stable 331.0 G30.9    290.20 F02.81     F22   3. Primary osteoarthritis involving multiple joints - stable 715.09 M15.0   4. Essential hypertension, benign - stable 401.1 I10   5. PAD (peripheral artery disease) - stable 443.9 I73.9   6. Depression - stable 311 F32.9   7. Dysphagia - stable 787.20 R13.10   8. Anemia, iron deficiency - stable 280.9 D50.9     --resume remeron 7.28m qhs to help insomnia. Psych to follow  --cont current meds as ordered  --cont nutritional supplements as ordered  --cont restorative feeding program  --f/u vascular sx as scheduled next week  --cont use of prevalon boots to protect skin  --will follow Derrel Moore S. CPerlie Gold PSutter [facility]and [facility] [address]GWhitehall [place] [address]([phone] (Monday-Friday 8 AM - 5 PM) ([phone] 5 PM and follow prompts

## 2014-08-30 ENCOUNTER — Encounter: Payer: Self-pay | Admitting: Vascular Surgery

## 2014-08-30 DIAGNOSIS — A047 Enterocolitis due to Clostridium difficile: Secondary | ICD-10-CM | POA: Diagnosis not present

## 2014-08-30 DIAGNOSIS — R197 Diarrhea, unspecified: Secondary | ICD-10-CM | POA: Diagnosis not present

## 2014-08-31 ENCOUNTER — Encounter: Payer: Self-pay | Admitting: Vascular Surgery

## 2014-08-31 ENCOUNTER — Ambulatory Visit (INDEPENDENT_AMBULATORY_CARE_PROVIDER_SITE_OTHER): Payer: Medicare Other | Admitting: Vascular Surgery

## 2014-08-31 VITALS — BP 119/84 | HR 68 | Temp 98.0°F | Ht <= 58 in | Wt 117.0 lb

## 2014-08-31 DIAGNOSIS — S81801A Unspecified open wound, right lower leg, initial encounter: Secondary | ICD-10-CM

## 2014-08-31 NOTE — Progress Notes (Signed)
Referred by:  Gildardo Cranker, DO [place], [place] [address]  Reason for referral: right heel wound  History of Present Illness  [name] is a 81 y.o. ([birth year]) female who presents with chief complaint: slow healing right heel wound that has been present for "a couple months.".  The history is limited due to patient's baseline dementia. She is a resident at [facility] facility is and accompanied by a patient transporter. She denies any pain to her right heel. She has been on antibiotics previously for her right heel ulcer secondary to drainage and foul odor. She is sedentary and does not ambulate. She has been using prevalon heel protector boots at her SNF. She denies any fever or chills.   Past Medical History  Diagnosis Date  . Diverticulosis of colon (without mention of hemorrhage) 06/24/2006  . Vitamin B12 deficiency   . Alzheimer's disease   . HTN (hypertension)   . Depression   . PVD (peripheral vascular disease)   . CAD (coronary artery disease)   . GERD (gastroesophageal reflux disease)   . H. pylori infection 2004  . Presbyesophagus   . Iron deficiency anemia, unspecified 01/02/2012  . Personal history of fall 01/04/2009  . Unspecified constipation   . Hemorrhage of rectum and anus   . Chest pain, unspecified   . Acute pharyngitis   . Edema   . Cataract   . HLD (hyperlipidemia)   . Osteoporosis, unspecified   . Carcinoma of cecum 1992    History of  . Malignant neoplasm of colon, unspecified site     Past Surgical History  Procedure Laterality Date  . Gallbladder surgery    . Abdominal hysterectomy  1995    with bso  . Right colectomy    . Cataract extraction  2001  . Cholecystectomy Right   . Hip arthroplasty Right 12/15/2013    Procedure: HEMIARTHROPLASTY RIGHTHIP;  Surgeon: Newt Minion, MD;  Location: [place];  Service: Orthopedics;  Laterality: Right;    History   Social History  . Marital Status: Widowed    Spouse  Name: N/A  . Number of Children: 2  . Years of Education: N/A   Occupational History  . Retired    Social History Main Topics  . Smoking status: Former Smoker    Types: Cigarettes  . Smokeless tobacco: Never Used  . Alcohol Use: No  . Drug Use: No  . Sexual Activity: No   Other Topics Concern  . Not on file   Social History Narrative    Family History  Problem Relation Age of Onset  . Heart disease Mother   . Colon cancer Neg Hx     Current Outpatient Prescriptions on File Prior to Visit  Medication Sig Dispense Refill  . Amino Acids-Protein Hydrolys (FEEDING SUPPLEMENT, PRO-STAT SUGAR FREE 64,) LIQD Take 30 mLs by mouth 3 (three) times daily with meals.    Marland Kitchen amLODipine (NORVASC) 10 MG tablet Take 10 mg by mouth daily. For hypertension    . aspirin EC 325 MG tablet Take 1 tablet (325 mg total) by mouth daily. 30 tablet 0  . Calcium Carbonate-Vitamin D (CALCIUM-VITAMIN D) 500-200 MG-UNIT per tablet Take 1 tablet by mouth daily. f    . Cholecalciferol (VITAMIN D) 2000 UNITS tablet Take 2,000 Units by mouth daily. For osteoporosis    . donepezil (ARICEPT) 10 MG tablet Take 0.5 tablets (5 mg total) by mouth daily. For alzheimers disease 90 tablet  11  . ferrous sulfate 325 (65 FE) MG tablet Take 325 mg by mouth daily with breakfast.    . lisinopril (PRINIVIL,ZESTRIL) 10 MG tablet Take 10 mg by mouth daily.    . metoprolol (LOPRESSOR) 25 MG tablet Take 1 tablet (25 mg total) by mouth 2 (two) times daily. If pulse <50 or SBP <100 hold med and notify MD. 60 tablet 0  . Nutritional Supplements (TWOCAL HN) LIQD Take 120 mLs by mouth 3 (three) times daily.     . sennosides-docusate sodium (SENOKOT-S) 8.6-50 MG tablet Take 1 tablet by mouth daily.    . sucralfate (CARAFATE) 1 GM/10ML suspension Take 1 g by mouth 4 (four) times daily -  before meals and at bedtime.    . vitamin B-12 (CYANOCOBALAMIN) 1000 MCG tablet Take 1,000 mcg by mouth every other day.    Marland Kitchen acetaminophen (TYLENOL) 500  MG tablet Take 1,000 mg by mouth every 8 (eight) hours as needed for mild pain.    Marland Kitchen HYDROcodone-acetaminophen (NORCO) 5-325 MG per tablet Take one tablet by mouth every 6 hours as needed for pain. Not to exceed 3000mg  of APAP from all sources/24hr (Patient not taking: Reported on 08/31/2014) 120 tablet 0  . ipratropium-albuterol (DUONEB) 0.5-2.5 (3) MG/3ML SOLN Take 3 mLs by nebulization every 6 (six) hours as needed (shortness of breath.).    Marland Kitchen Multiple Vitamin (MULTIVITAMIN WITH MINERALS) TABS tablet Take 1 tablet by mouth daily.     No current facility-administered medications on file prior to visit.    Allergies  Allergen Reactions  . Captopril-Hydrochlorothiazide Other (See Comments)    Reaction unknown  . Niacin And Related Other (See Comments)    Reaction unknown  . Urispas [Flavoxate] Other (See Comments)    Reaction unknown  . Verapamil Other (See Comments)    Reaction unknown    REVIEW OF SYSTEMS:  (Positives checked otherwise negative)  CARDIOVASCULAR:  [ ]  chest pain, [ ]  chest pressure, [ ]  palpitations, [ ]  shortness of breath when laying flat, [ ]  shortness of breath with exertion,   [ ]  pain in feet when walking, [ ]  pain in feet when laying flat, [ ]  history of blood clot in veins (DVT), [ ]  history of phlebitis, [ ]  swelling in legs, [ ]  varicose veins  PULMONARY:  [ ]  productive cough, [ ]  asthma, [ ]  wheezing  NEUROLOGIC:  [ ]  weakness in arms or legs, [ ]  numbness in arms or legs, [ ]  difficulty speaking or slurred speech, [ ]  temporary loss of vision in one eye, [ ]  dizziness  HEMATOLOGIC:  [ ]  bleeding problems, [ ]  problems with blood clotting too easily  MUSCULOSKEL:  [ ]  joint pain, [ ]  joint swelling  GASTROINTEST:  [ ]   Vomiting blood, [ ]   Blood in stool     GENITOURINARY:  [ ]   Burning with urination, [ ]   Blood in urine  PSYCHIATRIC:  [ ]  history of major depression  INTEGUMENTARY:  [ ]  rashes, [x ] ulcers  CONSTITUTIONAL:  [ ]  fever, [ ]   chills  For VQI Use Only   PRE-ADM LIVING: Nursing home  AMB STATUS: Wheelchair  CAD Sx: None  PRIOR CHF: None   Physical Examination Filed Vitals:   08/31/14 1519  BP: 119/84  Pulse: 68  Temp: 98 F (36.7 C)  TempSrc: Oral  Height: 4\' 10"  (1.473 m)  Weight: 117 lb (53.071 kg)  SpO2: 96%   Body mass index is 24.46 kg/(m^2).  General: A&O to person only, WDWN frail elderly female in NAD, sitting in wheelchair  Head: [place]/AT  Pulmonary: Sym exp, good air movt, CTAB, no rales, rhonchi, & wheezing  Cardiac: RRR, Nl S1, S2, no Murmurs, rubs or gallops, no carotid bruits.   Vascular: 2+ radial pulses. Faintly palpable femoral pulses secondary to patient positioning in wheelchair. Non palpable popliteal and pedal pulses b/l. Strong right peroneal doppler signal. Mixed venous/arterial doppler flow right posterior tibial. No dorsalis pedis doppler flow  Musculoskeletal: approx. 3 x 3 cm  full thickness right heel ulcer with mild bleeding and good granulation base. No odor. No purulence.   Neurologic: Sensation intact to lower extremities.   Psychiatric: Baseline dementia.  Dermatologic: See M/S exam for extremity exam, no rashes otherwise noted  Outside Studies/Documentation 1 page of outside documents were reviewed including: arterial duplex right lower extremity revealing right SFA occlusion and right infrapopliteal arterial occlusion  Medical Decision Making  Teyah Rossy Pelto is a 81 y.o. female who presents with: slow healing right heel wound. She is relatively asymptomatic and denies any pain.   Doppler exam today reveals good right peroneal doppler flow. Her wound has a good granulation base and she should have enough arterial flow to heal her wound  Explained that wounds in this area are very slow to heal.   No evidence of infection.   Continue to float heels.   As patient is non-ambulatory and arterial duplex revealing multiple levels of stenosis, she is not a  candidate for percutaneous intervention. Given her age and multiple co morbidities, she is not a candidate for peripheral bypass.    If she were to develop worsening pain or infection, she will need an amputation.   Follow up prn.   Virgina Jock, PA-C [facility] Office: [phone] Pager: [phone]  08/31/2014, 3:59 PM  This patient was seen and examined in conjunction with Dr. Donnetta Hutching.   I have examined the patient, reviewed and agree with above. Her heel ulcer actually looks quite clean. She denies any pain associated with this. Hand-held Doppler reveals good signal in her peroneal artery on the right and somewhat dampened flow in her posterior tibial and dorsalis pedis although these are all present. She does not have a palpable popliteal pulse. With her baseline dementia nonambulatory status, would not recommend aggressive attempts at revascularization. I feel that she should have adequate flow to heal this very clean superficial heel ulcer. Explained the critical importance of keeping pressure off the heel. This is being done at her nursing facility. If she develops progressive necrosis or pain with proceed with amputation at that time. We'll see her again on an as-needed basis  Curt Jews, MD 08/31/2014 4:41 PM

## 2014-09-07 ENCOUNTER — Encounter: Payer: Self-pay | Admitting: Internal Medicine

## 2014-09-24 DIAGNOSIS — A047 Enterocolitis due to Clostridium difficile: Secondary | ICD-10-CM | POA: Diagnosis not present

## 2014-10-02 IMAGING — CT CT ANGIO CHEST
1 of 2 series · 19 of 32 positions shown · IV contrast (OMNIPAQUE 300)
Comparison: 09/22/2008 CT and 04/06/2012 chest radiograph

CLINICAL DATA: 89-year-old female with chest pain, tachycardia,
fever and shortness of breath.

CT ANGIOGRAPHY CHEST
TECHNIQUE: Multidetector CT imaging of the chest using the
standard protocol during bolus administration of intravenous
contrast. Multiplanar reconstructed images including MIPs were
obtained and reviewed to evaluate the vascular anatomy.
Contrast: 100mL OMNIPAQUE IOHEXOL 350 MG/ML SOLN

[Series 5: thins for pacs · axial · 0.59mm/px · z∈[-229,-18]mm · 19 of 233 slices shown]
[im 11/233  lung]
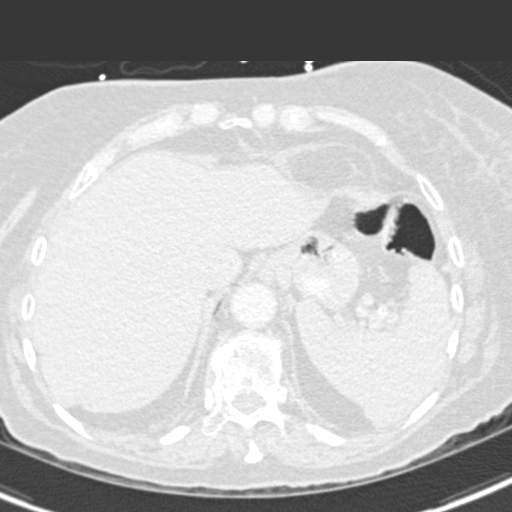
[im 21/233  soft-tissue]
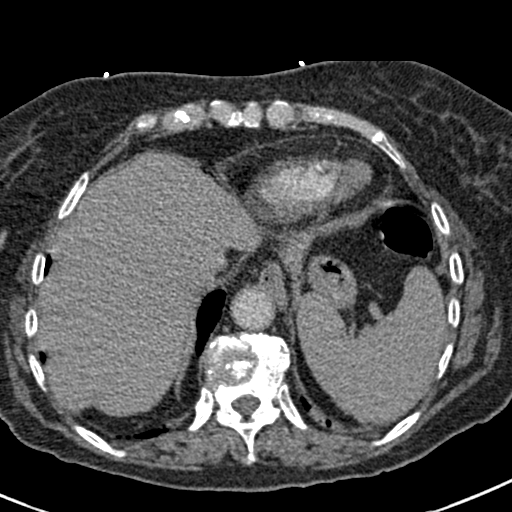
[im 31/233  lung]
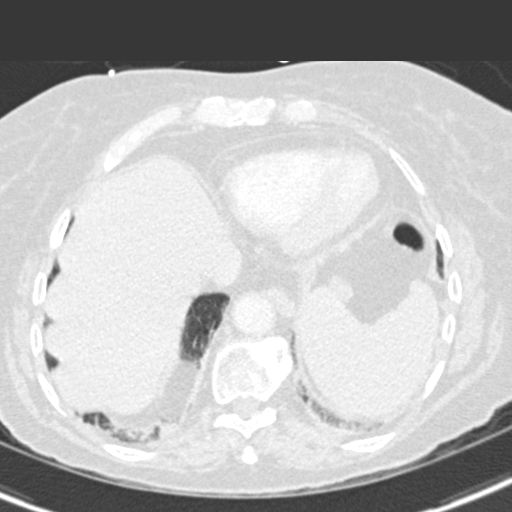
[im 51/233  soft-tissue]
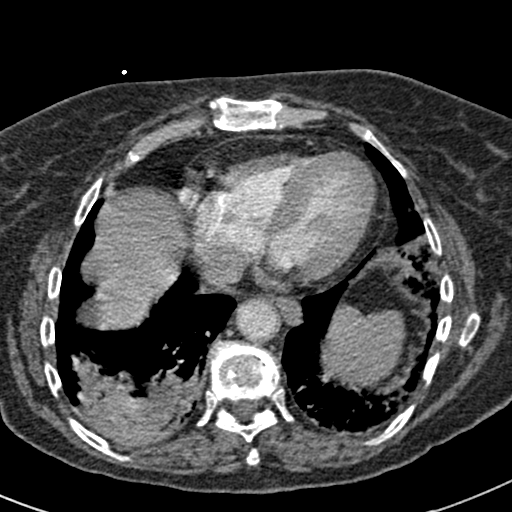
[im 61/233  lung]
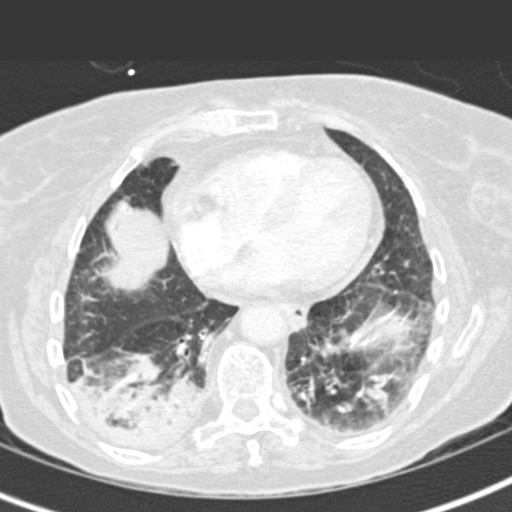
[im 71/233  soft-tissue]
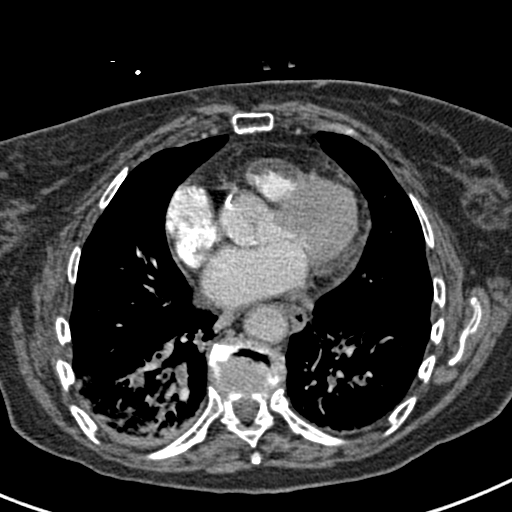
[im 81/233  lung]
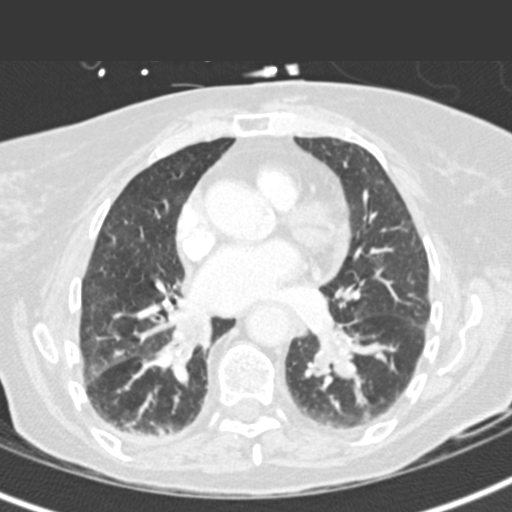
[im 91/233  soft-tissue]
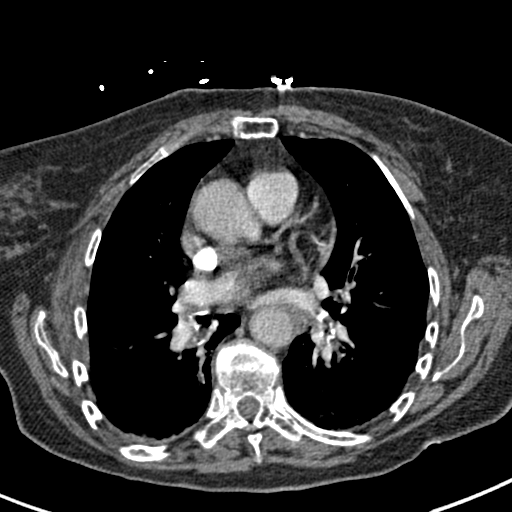
[im 101/233  lung]
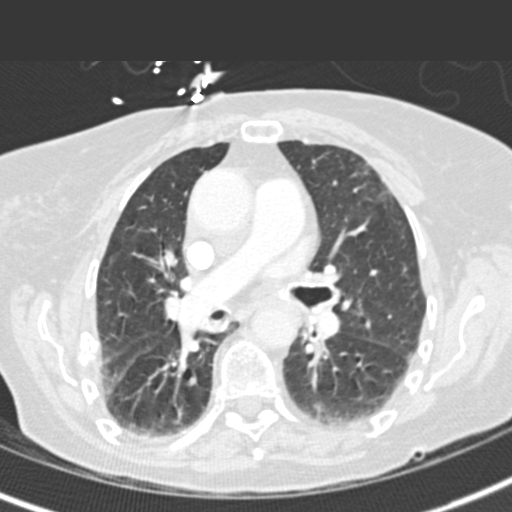
[im 122/233  soft-tissue]
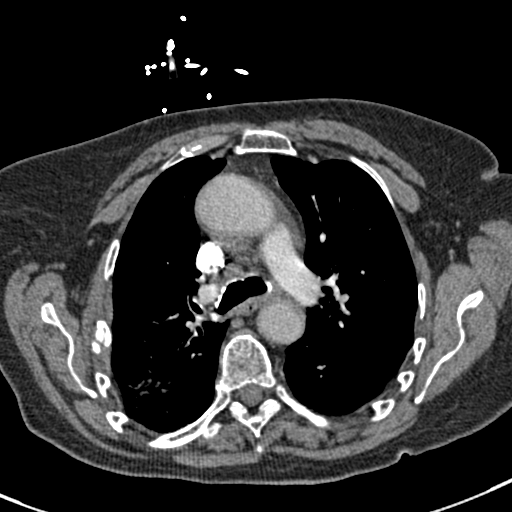
[im 132/233  lung]
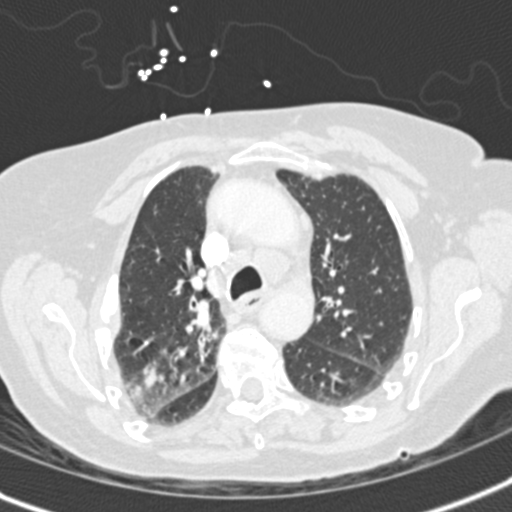
[im 142/233  soft-tissue]
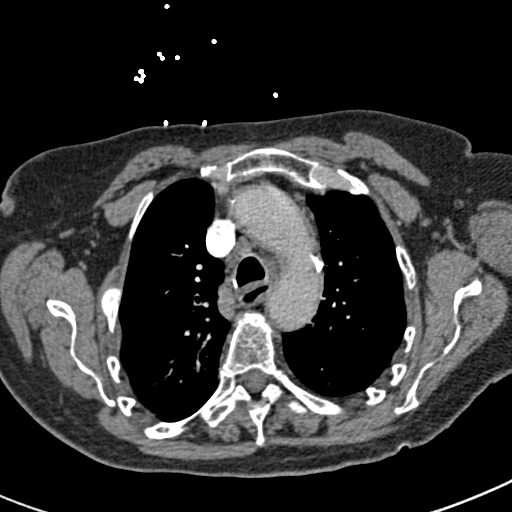
[im 152/233  lung]
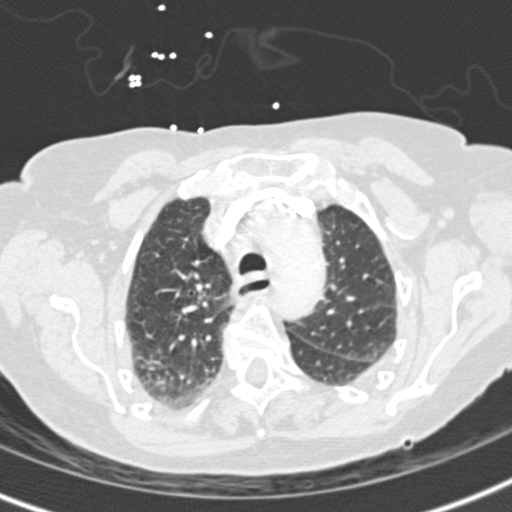
[im 162/233  soft-tissue]
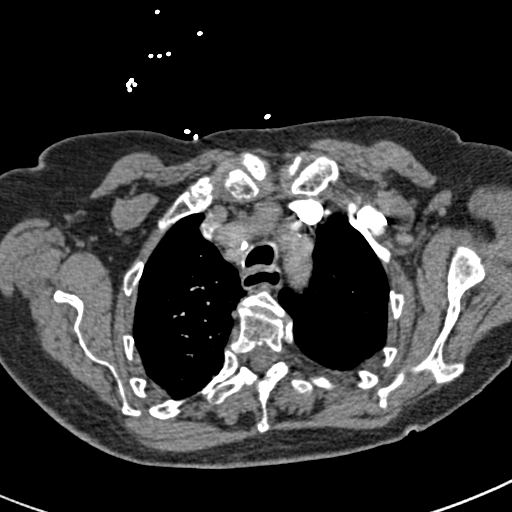
[im 172/233  lung]
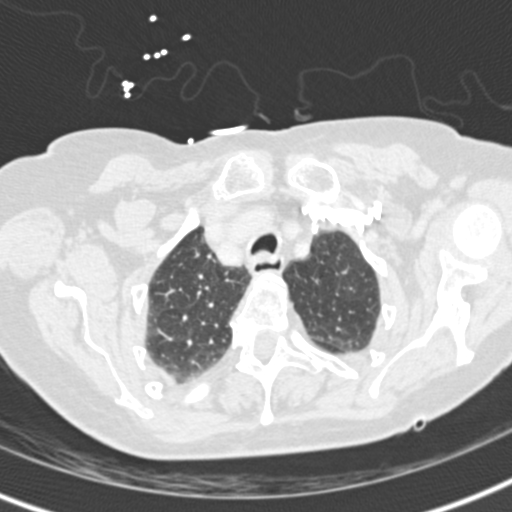
[im 182/233  soft-tissue]
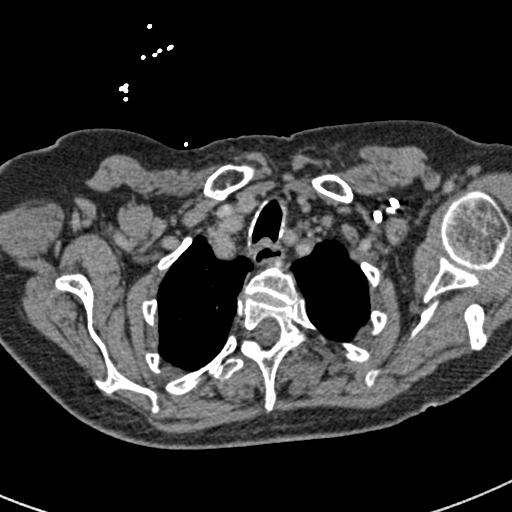
[im 202/233  lung]
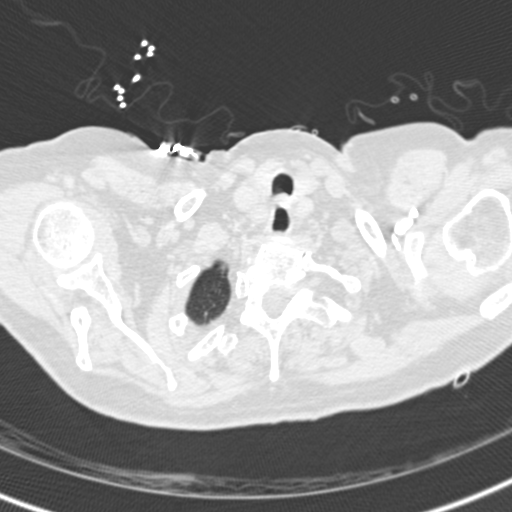
[im 212/233  soft-tissue]
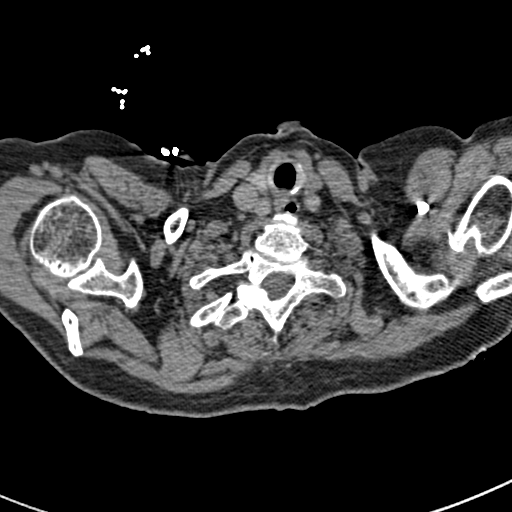
[im 222/233  lung]
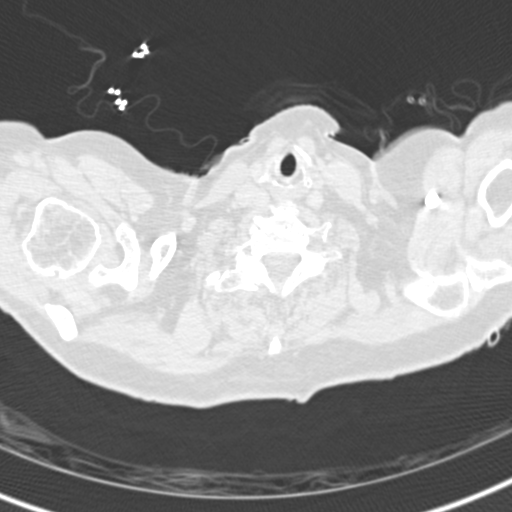

[19 of 32 positions shown; findings below may reference images not displayed]

FINDINGS: This is a technically equivocal study secondary to
suboptimal opacification of the pulmonary arteries.

No definite large or central pulmonary emboli identified. Mild
ectasia of the thoracic aorta noted without evidence of aneurysm.
Upper limits normal heart size present.

There are no pleural or pericardial effusions.
Prominent mediastinal lymph nodes are unchanged from 7555.

Right lower lobe consolidation, mild left lower lobe basilar
airspace opacities and posterior right upper lobe airspace disease
noted compatible with pneumonia or aspiration. Mucous/fluid fills
many of the bronchi to the lower lobe.
There is no evidence of suspicious nodule or mass.

The visualized upper abdomen is unremarkable.
Remote fractures of T12, right-sided rib and the sternum noted.
IMPRESSION: No evidence of pulmonary emboli.

Right lower lobe consolidation and airspace disease within the left
lower lobe and posterior right upper lobe - compatible with
aspiration or pneumonia.  Mucus/fluid fills many of the lower lobe
bronchi bilaterally.

Ectatic thoracic aorta without aneurysm.

Remote T12, right rib and sternal fractures.

## 2014-10-04 ENCOUNTER — Other Ambulatory Visit: Payer: Self-pay | Admitting: *Deleted

## 2014-10-04 MED ORDER — HYDROCODONE-ACETAMINOPHEN 5-325 MG PO TABS: ORAL_TABLET | ORAL | Status: DC

## 2014-10-04 NOTE — Telephone Encounter (Signed)
[facility] 

## 2014-10-05 ENCOUNTER — Other Ambulatory Visit: Payer: Self-pay | Admitting: *Deleted

## 2014-10-05 MED ORDER — HYDROCODONE-ACETAMINOPHEN 5-325 MG PO TABS: ORAL_TABLET | ORAL | Status: DC

## 2014-10-05 NOTE — Progress Notes (Signed)
Patient ID: [name], female   DOB: [birth year], 81 y.o.   MRN: [id number]   Facility: [facility]      Allergies  Allergen Reactions  . Captopril-Hydrochlorothiazide Other (See Comments)    Reaction unknown  . Niacin And Related Other (See Comments)    Reaction unknown  . Urispas [Flavoxate] Other (See Comments)    Reaction unknown  . Verapamil Other (See Comments)    Reaction unknown    Chief Complaint  Patient presents with  . Acute Visit    wound management     HPI:  She has long term right heel ulceration. The large eschar area has self debrided; the staff has asked me to review her ulceration. There are no signs of infection present. She does not complaint of pain present. She is unable to fully participate in the hpi or ros.    Past Medical History  Diagnosis Date  . Diverticulosis of colon (without mention of hemorrhage) 06/24/2006  . Vitamin B12 deficiency   . Alzheimer's disease   . HTN (hypertension)   . Depression   . PVD (peripheral vascular disease)   . CAD (coronary artery disease)   . GERD (gastroesophageal reflux disease)   . H. pylori infection 2004  . Presbyesophagus   . Iron deficiency anemia, unspecified 01/02/2012  . Personal history of fall 01/04/2009  . Unspecified constipation   . Hemorrhage of rectum and anus   . Chest pain, unspecified   . Acute pharyngitis   . Edema   . Cataract   . HLD (hyperlipidemia)   . Osteoporosis, unspecified   . Carcinoma of cecum 1992    History of  . Malignant neoplasm of colon, unspecified site     Past Surgical History  Procedure Laterality Date  . Gallbladder surgery    . Abdominal hysterectomy  1995    with bso  . Right colectomy    . Cataract extraction  2001  . Cholecystectomy Right   . Hip arthroplasty Right 12/15/2013    Procedure: HEMIARTHROPLASTY RIGHTHIP;  Surgeon: Newt Minion, MD;  Location: [place];  Service: Orthopedics;  Laterality: Right;    VITAL SIGNS BP  116/60 mmHg  Pulse 64  Ht 4\' 10"  (1.473 m)  Wt 117 lb (53.071 kg)  BMI 24.46 kg/m2  Patient's Medications  New Prescriptions   No medications on file  Previous Medications   ACETAMINOPHEN (TYLENOL) 500 MG TABLET    Take 1,000 mg by mouth every 8 (eight) hours as needed for mild pain.   AMINO ACIDS-PROTEIN HYDROLYS (FEEDING SUPPLEMENT, PRO-STAT SUGAR FREE 64,) LIQD    Take 30 mLs by mouth 3 (three) times daily with meals.   AMLODIPINE (NORVASC) 10 MG TABLET    Take 10 mg by mouth daily. For hypertension   ASPIRIN EC 325 MG TABLET    Take 1 tablet (325 mg total) by mouth daily.   CALCIUM CARBONATE-VITAMIN D (CALCIUM-VITAMIN D) 500-200 MG-UNIT PER TABLET    Take 1 tablet by mouth daily. f   CHOLECALCIFEROL (VITAMIN D) 2000 UNITS TABLET    Take 2,000 Units by mouth daily. For osteoporosis   DONEPEZIL (ARICEPT) 10 MG TABLET    Take 0.5 tablets (5 mg total) by mouth daily. For alzheimers disease   FERROUS SULFATE 325 (65 FE) MG TABLET    Take 325 mg by mouth daily with breakfast.   HYDROCODONE-ACETAMINOPHEN (NORCO) 5-325 MG PER TABLET    Take one tablet by mouth every 6 hours as  needed for pain. Not to exceed 3000mg  of APAP from all sources/24hr   IPRATROPIUM-ALBUTEROL (DUONEB) 0.5-2.5 (3) MG/3ML SOLN    Take 3 mLs by nebulization every 6 (six) hours as needed (shortness of breath.).   LISINOPRIL (PRINIVIL,ZESTRIL) 10 MG TABLET    Take 10 mg by mouth daily.   METOPROLOL (LOPRESSOR) 25 MG TABLET    Take 1 tablet (25 mg total) by mouth 2 (two) times daily. If pulse <50 or SBP <100 hold med and notify MD.   MULTIPLE VITAMIN (MULTIVITAMIN WITH MINERALS) TABS TABLET    Take 1 tablet by mouth daily.   MULTIPLE VITAMINS-MINERALS (DECUBI-VITE) CAPS    Take 2 capsules by mouth daily.   NUTRITIONAL SUPPLEMENTS (TWOCAL HN) LIQD    Take 120 mLs by mouth 3 (three) times daily.    SENNOSIDES-DOCUSATE SODIUM (SENOKOT-S) 8.6-50 MG TABLET    Take 1 tablet by mouth daily.   SUCRALFATE (CARAFATE) 1 GM/10ML  SUSPENSION    Take 1 g by mouth 4 (four) times daily -  before meals and at bedtime.   VITAMIN B-12 (CYANOCOBALAMIN) 1000 MCG TABLET    Take 1,000 mcg by mouth every other day.  Modified Medications   No medications on file  Discontinued Medications   No medications on file     SIGNIFICANT DIAGNOSTIC EXAMS  12-14-13: right hip x-ray: 1. Acute, impacted right femoral neck fracture. The distal fracture fragment is anteriorly displaced approximately 2 cm on the cross-table lateral view.  2. Decreased bony mineralization.  3. Cannot exclude an L3 compression fracture. No prior images of the lumbar spine available.  4. Prominent amount of stool in the rectum.  12-14-13: chest x-ray: Right basilar atelectasis.  01-27-14: chest x-ray: density right lung base suggestive of pneumonia possible associated effusion.   01-29-14: chest x-ray: right lower lobe plate like atelectasis in comparison no significant change. Atherosclerotic aorta. Moderate demineralization     03-13-14: chest x-ray: no acute cardiopulmonary disease process. Chronic interstial   05-13-14: right foot x-ray: no acute osseous abnormality  06-04-14; right ABI: moderate plaque right superficial femoral artery stenosis; infrapopliteal arterial occlusion. Right leg changes are in the severe ischemia range.      LABS REVIEWED:   12-07-13: urine culture: e-coli: zosyn 12-10-13: wbc 6.5; hgb 10.8; hct 35.4; mcv 91.7; ;plt 223; glucose 137; bun 18.2; creat 0.62; k+4.2; na++142 12-12-13: chol 180; ldl 102; trig 170; liver normal albumin 4.2 12-14-13: wbc 12.4; hgb 11.7; hct 35.6; mcv 89.9; plt 239; glucose 111; bun 32; creat 0.84; k+4.1 ;n++142; urine culture: no growth  12-17-13: wbc 11.7; hgb 8.7; hct 26.9; mcv 90.9; plt 199; glucose 90; bun 21; creat 0.7; k+3.9; na++146; liver normal albumin 2.6  12-18-13: glucose 93; bun 16.9; creat 0.53; k+3.9; na++145 12-22-13: wbc 9.0; hgb 7.8; hct 25.7; mcv 92.2 ;plt 346 12-24-13: glucose 78; bun  17.5; creat 0.55; k+4.1; na++143  01-29-14: wbc 8.6; hgb 10.4; hct 33.9; mcv 84.4; plt 369; glucose 98; bun 10.3; creat 0.52; k+3.9; na++144 03-13-14: wbc 12.3; hgb 9.6; hct 31.8; mcv 82.5 ;plt 229; glucose 81; bun 35.3;creat 0.75; k+4.0; na++146; liver norm albumin 3.4 03-17-14: right heel culture: staph aureus: septra ds  03-20-14: pre-albumin 12 05-13-14: wbc 10.8; hgb 10.7; hct 35.3; mcv 81.6; plt 290; glucose 153; bun 23.4; creat 0.68; k+4.3; na++145; liver normal albumin 3.8; pre-albumin 16     Review of Systems  Unable to perform ROS: Dementia      Physical Exam Constitutional: No distress.  Frail  Neck: Neck supple. No JVD present. No thyromegaly present.  Cardiovascular: Normal rate, regular rhythm and intact distal pulses.   Respiratory: Effort normal and breath sounds normal. No respiratory distress. She has no wheezes.  GI: Soft. Bowel sounds are normal. She exhibits no distension. There is no tenderness.  Musculoskeletal: She exhibits no edema.  Is able to move all extremities; is status post right femoral neck fracture Neurological: She is alert.  Skin: Skin is warm and dry. She is not diaphoretic. Right heel ulceration: 2.9 x 2.8 x 0.3 cm 10% slough 90% granulating tissue.     ASSESSMENT/ PLAN:  1. Right heel ulceration: will use silver alginate to the wound bed; and will monitor her status.    Ok Edwards NP [facility]  Contact [phone] Monday through Friday 8am- 5pm  After hours call [phone]

## 2014-10-05 NOTE — Telephone Encounter (Signed)
Per Nira Conn at [facility] [facility] [phone]

## 2014-10-06 ENCOUNTER — Non-Acute Institutional Stay (SKILLED_NURSING_FACILITY): Payer: Medicare Other | Admitting: Adult Health

## 2014-10-06 DIAGNOSIS — K5900 Constipation, unspecified: Secondary | ICD-10-CM

## 2014-10-06 DIAGNOSIS — K219 Gastro-esophageal reflux disease without esophagitis: Secondary | ICD-10-CM | POA: Diagnosis not present

## 2014-10-06 DIAGNOSIS — I10 Essential (primary) hypertension: Secondary | ICD-10-CM | POA: Diagnosis not present

## 2014-10-06 DIAGNOSIS — D509 Iron deficiency anemia, unspecified: Secondary | ICD-10-CM

## 2014-10-06 DIAGNOSIS — L97412 Non-pressure chronic ulcer of right heel and midfoot with fat layer exposed: Secondary | ICD-10-CM

## 2014-10-06 DIAGNOSIS — I739 Peripheral vascular disease, unspecified: Secondary | ICD-10-CM

## 2014-10-06 DIAGNOSIS — R634 Abnormal weight loss: Secondary | ICD-10-CM | POA: Diagnosis not present

## 2014-10-06 DIAGNOSIS — R1310 Dysphagia, unspecified: Secondary | ICD-10-CM

## 2014-11-04 ENCOUNTER — Non-Acute Institutional Stay (SKILLED_NURSING_FACILITY): Payer: Medicare Other | Admitting: Adult Health

## 2014-11-04 DIAGNOSIS — R634 Abnormal weight loss: Secondary | ICD-10-CM

## 2014-11-04 DIAGNOSIS — I2510 Atherosclerotic heart disease of native coronary artery without angina pectoris: Secondary | ICD-10-CM | POA: Diagnosis not present

## 2014-11-04 DIAGNOSIS — F02818 Dementia in other diseases classified elsewhere, unspecified severity, with other behavioral disturbance: Secondary | ICD-10-CM

## 2014-11-04 DIAGNOSIS — G301 Alzheimer's disease with late onset: Secondary | ICD-10-CM

## 2014-11-04 DIAGNOSIS — L97411 Non-pressure chronic ulcer of right heel and midfoot limited to breakdown of skin: Secondary | ICD-10-CM | POA: Diagnosis not present

## 2014-11-04 DIAGNOSIS — F22 Delusional disorders: Secondary | ICD-10-CM

## 2014-11-04 DIAGNOSIS — I739 Peripheral vascular disease, unspecified: Secondary | ICD-10-CM

## 2014-11-04 DIAGNOSIS — G309 Alzheimer's disease, unspecified: Secondary | ICD-10-CM | POA: Diagnosis not present

## 2014-11-04 DIAGNOSIS — I10 Essential (primary) hypertension: Secondary | ICD-10-CM | POA: Diagnosis not present

## 2014-11-04 DIAGNOSIS — K5900 Constipation, unspecified: Secondary | ICD-10-CM | POA: Diagnosis not present

## 2014-11-04 DIAGNOSIS — K219 Gastro-esophageal reflux disease without esophagitis: Secondary | ICD-10-CM | POA: Diagnosis not present

## 2014-11-04 DIAGNOSIS — F0281 Dementia in other diseases classified elsewhere with behavioral disturbance: Secondary | ICD-10-CM

## 2014-11-10 ENCOUNTER — Encounter: Payer: Self-pay | Admitting: Adult Health

## 2014-11-10 NOTE — Progress Notes (Signed)
Patient ID: [name], female   DOB: 1922/12/02, 81 y.o.   MRN: [id number]    Facility: [facility]      Allergies  Allergen Reactions  . Captopril-Hydrochlorothiazide Other (See Comments)    Reaction unknown  . Niacin And Related Other (See Comments)    Reaction unknown  . Urispas [Flavoxate] Other (See Comments)    Reaction unknown  . Verapamil Other (See Comments)    Reaction unknown    Chief Complaint  Patient presents with  . Medical Management of Chronic Issues    HPI:  She is a long term resident of this facility being seen for the management of her chronic illnesses. Overall her status is stable. She is unable to fully participate in the hpi or ros; but tells me that she is doing well.  There are no nursing concerns at this time.     Past Medical History  Diagnosis Date  . Diverticulosis of colon (without mention of hemorrhage) 06/24/2006  . Vitamin B12 deficiency   . Alzheimer's disease   . HTN (hypertension)   . Depression   . PVD (peripheral vascular disease) ([place])   . CAD (coronary artery disease)   . GERD (gastroesophageal reflux disease)   . H. pylori infection 2004  . Presbyesophagus   . Iron deficiency anemia, unspecified 01/02/2012  . Personal history of fall 01/04/2009  . Unspecified constipation   . Hemorrhage of rectum and anus   . Chest pain, unspecified   . Acute pharyngitis   . Edema   . Cataract   . HLD (hyperlipidemia)   . Osteoporosis, unspecified   . Carcinoma of cecum ([place]) 1992    History of  . Malignant neoplasm of colon, unspecified site     Past Surgical History  Procedure Laterality Date  . Gallbladder surgery    . Abdominal hysterectomy  1995    with bso  . Right colectomy    . Cataract extraction  2001  . Cholecystectomy Right   . Hip arthroplasty Right 12/15/2013    Procedure: HEMIARTHROPLASTY RIGHTHIP;  Surgeon: Newt Minion, MD;  Location: [facility];  Service: Orthopedics;  Laterality: Right;     VITAL SIGNS BP 138/88 mmHg  Pulse 83  Ht 4\' 10"  (1.473 m)  Wt 123 lb (55.792 kg)  BMI 25.71 kg/m2  Patient's Medications  New Prescriptions   No medications on file  Previous Medications   AMINO ACIDS-PROTEIN HYDROLYS (FEEDING SUPPLEMENT, PRO-STAT SUGAR FREE 64,) LIQD    Take 30 mLs by mouth 3 (three) times daily with meals.   AMLODIPINE (NORVASC) 10 MG TABLET    Take 10 mg by mouth daily. For hypertension   ASPIRIN EC 325 MG TABLET    Take 1 tablet (325 mg total) by mouth daily.   CALCIUM CARBONATE-VITAMIN D (CALCIUM-VITAMIN D) 500-200 MG-UNIT PER TABLET    Take 1 tablet by mouth daily. f   CHOLECALCIFEROL (VITAMIN D) 2000 UNITS TABLET    Take 2,000 Units by mouth daily. For osteoporosis   DONEPEZIL (ARICEPT) 10 MG TABLET    Take 0.5 tablets (5 mg total) by mouth daily. For alzheimers disease   FERROUS SULFATE 325 (65 FE) MG TABLET    Take 325 mg by mouth daily with breakfast.   HYDROCODONE-ACETAMINOPHEN (NORCO) 5-325 MG PER TABLET    Take one tablet by mouth every 6 hours as needed for breakthrough pain. If unrelieved with scheduled Hydrocodone. Not to exceed 3000mg  of APAP from all sources/24hr  IPRATROPIUM-ALBUTEROL (DUONEB) 0.5-2.5 (3) MG/3ML SOLN    Take 3 mLs by nebulization every 6 (six) hours as needed (shortness of breath.).   LISINOPRIL (PRINIVIL,ZESTRIL) 10 MG TABLET    Take 10 mg by mouth daily.   METOPROLOL (LOPRESSOR) 25 MG TABLET    Take 1 tablet (25 mg total) by mouth 2 (two) times daily. If pulse <50 or SBP <100 hold med and notify MD.   MIRTAZAPINE (REMERON) 7.5 MG TABLET    Take 7.5 mg by mouth at bedtime.   MULTIPLE VITAMIN (MULTIVITAMIN WITH MINERALS) TABS TABLET    Take 1 tablet by mouth daily.   MULTIPLE VITAMINS-MINERALS (DECUBI-VITE) CAPS    Take 2 capsules by mouth daily.   NUTRITIONAL SUPPLEMENTS (TWOCAL HN) LIQD    Take 120 mLs by mouth 3 (three) times daily.    SENNOSIDES-DOCUSATE SODIUM (SENOKOT-S) 8.6-50 MG TABLET    Take 1 tablet by mouth daily.    SUCRALFATE (CARAFATE) 1 GM/10ML SUSPENSION    Take 1 g by mouth 4 (four) times daily -  before meals and at bedtime.   VITAMIN B-12 (CYANOCOBALAMIN) 1000 MCG TABLET    Take 1,000 mcg by mouth every other day.  Modified Medications   No medications on file  Discontinued Medications   No medications on file     SIGNIFICANT DIAGNOSTIC EXAMS  12-14-13: right hip x-ray: 1. Acute, impacted right femoral neck fracture. The distal fracture fragment is anteriorly displaced approximately 2 cm on the cross-table lateral view.  2. Decreased bony mineralization.  3. Cannot exclude an L3 compression fracture. No prior images of the lumbar spine available.  4. Prominent amount of stool in the rectum.  12-14-13: chest x-ray: Right basilar atelectasis.  01-27-14: chest x-ray: density right lung base suggestive of pneumonia possible associated effusion.   01-29-14: chest x-ray: right lower lobe plate like atelectasis in comparison no significant change. Atherosclerotic aorta. Moderate demineralization     03-13-14: chest x-ray: no acute cardiopulmonary disease process. Chronic interstial   05-13-14: right foot x-ray: no acute osseous abnormality  06-04-14; right ABI: moderate plaque right superficial femoral artery stenosis; infrapopliteal arterial occlusion. Right leg changes are in the severe ischemia range.      LABS REVIEWED:   12-07-13: urine culture: e-coli: zosyn 12-10-13: wbc 6.5; hgb 10.8; hct 35.4; mcv 91.7; ;plt 223; glucose 137; bun 18.2; creat 0.62; k+4.2; na++142 12-12-13: chol 180; ldl 102; trig 170; liver normal albumin 4.2 12-14-13: wbc 12.4; hgb 11.7; hct 35.6; mcv 89.9; plt 239; glucose 111; bun 32; creat 0.84; k+4.1 ;n++142; urine culture: no growth  12-17-13: wbc 11.7; hgb 8.7; hct 26.9; mcv 90.9; plt 199; glucose 90; bun 21; creat 0.7; k+3.9; na++146; liver normal albumin 2.6  12-18-13: glucose 93; bun 16.9; creat 0.53; k+3.9; na++145 12-22-13: wbc 9.0; hgb 7.8; hct 25.7; mcv 92.2 ;plt  346 12-24-13: glucose 78; bun 17.5; creat 0.55; k+4.1; na++143  01-29-14: wbc 8.6; hgb 10.4; hct 33.9; mcv 84.4; plt 369; glucose 98; bun 10.3; creat 0.52; k+3.9; na++144 03-13-14: wbc 12.3; hgb 9.6; hct 31.8; mcv 82.5 ;plt 229; glucose 81; bun 35.3;creat 0.75; k+4.0; na++146; liver norm albumin 3.4 03-17-14: right heel culture: staph aureus: septra ds  03-20-14: pre-albumin 12 05-13-14: wbc 10.8; hgb 10.7; hct 35.3; mcv 81.6; plt 290; glucose 153; bun 23.4; creat 0.68; k+4.3; na++145; liver normal albumin 3.8; pre-albumin 16  08-30-14: stool + c-diff; + guaiac 09-24-14: stool guaiac neg       Review of Systems Unable to perform ROS:  Dementia    Physical Exam Constitutional: No distress.  Frail   Neck: Neck supple. No JVD present. No thyromegaly present.  Cardiovascular: Normal rate, regular rhythm and intact distal pulses.   Respiratory: Effort normal and breath sounds normal. No respiratory distress. She has no wheezes.  GI: Soft. Bowel sounds are normal. She exhibits no distension. There is no tenderness.  Musculoskeletal: She exhibits no edema.  Is able to move all extremities; is status post right femoral neck fracture Neurological: She is alert.  Skin: Skin is warm and dry. She is not diaphoretic. Right heel ulceration: 2.9 x 2.8 x 0.3 cm 10% slough 90% granulating tissue.no signs of infection       ASSESSMENT/ PLAN:   1. Right heel ulceration due to severe PAD: will continue current treatment  Will continue vicodin 5/325 mg every 6 hours and every 6 hours as needed; and will monitor her status.   2. Hypertension: will continue norvasc 10 mg daily; lisinopril 10 mg daily; lopressor 25 mg twice daily; will continue to monitor  3. Dementia: is presently without change in status; will continue aricept 5 mg daily and will monitor  4. CAD: no complaint of chest pain present; will continue asa 325 mg daily and will monitor   5. GERD: will continue carafate 1 gm four times daily;    6. Anemia: will continue iron daily; hgb is 10.7  7. Constipation: will continue senna s nightly   8. Weight loss: her current weight is 123 pounds; she continues with supplements and prostat per facility protocol. Her weight at this time is presently stable.  Her pre-albumin is [place] [facility]  Contact [phone] Monday through Friday 8am- 5pm  After hours call [phone]

## 2014-11-19 ENCOUNTER — Encounter: Payer: Self-pay | Admitting: Adult Health

## 2014-11-19 NOTE — Progress Notes (Signed)
Patient ID: [name], female   DOB: [birth year], 81 y.o.   MRN: [id number]    Facility: [facility]      Allergies  Allergen Reactions  . Captopril-Hydrochlorothiazide Other (See Comments)    Reaction unknown  . Niacin And Related Other (See Comments)    Reaction unknown  . Urispas [Flavoxate] Other (See Comments)    Reaction unknown  . Verapamil Other (See Comments)    Reaction unknown    Chief Complaint  Patient presents with  . Medical Management of Chronic Issues    HPI:  She is a long term resident of this facility being seen for the management of her chronic illnesses. Overall her status remains stable.    Past Medical History  Diagnosis Date  . Diverticulosis of colon (without mention of hemorrhage) 06/24/2006  . Vitamin B12 deficiency   . Alzheimer's disease   . HTN (hypertension)   . Depression   . PVD (peripheral vascular disease) ([place])   . CAD (coronary artery disease)   . GERD (gastroesophageal reflux disease)   . H. pylori infection 2004  . Presbyesophagus   . Iron deficiency anemia, unspecified 01/02/2012  . Personal history of fall 01/04/2009  . Unspecified constipation   . Hemorrhage of rectum and anus   . Chest pain, unspecified   . Acute pharyngitis   . Edema   . Cataract   . HLD (hyperlipidemia)   . Osteoporosis, unspecified   . Carcinoma of cecum ([place]) 1992    History of  . Malignant neoplasm of colon, unspecified site     Past Surgical History  Procedure Laterality Date  . Gallbladder surgery    . Abdominal hysterectomy  1995    with bso  . Right colectomy    . Cataract extraction  2001  . Cholecystectomy Right   . Hip arthroplasty Right 12/15/2013    Procedure: HEMIARTHROPLASTY RIGHTHIP;  Surgeon: Newt Minion, MD;  Location: [place];  Service: Orthopedics;  Laterality: Right;    VITAL SIGNS BP 118/76 mmHg  Pulse 92  Ht 4\' 10"  (1.473 m)  Wt 125 lb (56.7 kg)  BMI 26.13 kg/m2  SpO2 97%  Patient's  Medications  New Prescriptions   No medications on file  Previous Medications   ACETAMINOPHEN (TYLENOL) 500 MG TABLET    Take 1,000 mg by mouth every 8 (eight) hours as needed for mild pain.   AMINO ACIDS-PROTEIN HYDROLYS (FEEDING SUPPLEMENT, PRO-STAT SUGAR FREE 64,) LIQD    Take 30 mLs by mouth 3 (three) times daily with meals.   AMLODIPINE (NORVASC) 10 MG TABLET    Take 10 mg by mouth daily. For hypertension   ASPIRIN EC 325 MG TABLET    Take 1 tablet (325 mg total) by mouth daily.   CALCIUM CARBONATE-VITAMIN D (CALCIUM-VITAMIN D) 500-200 MG-UNIT PER TABLET    Take 1 tablet by mouth daily. f   CHOLECALCIFEROL (VITAMIN D) 2000 UNITS TABLET    Take 2,000 Units by mouth daily. For osteoporosis   DONEPEZIL (ARICEPT) 10 MG TABLET    Take 0.5 tablets (5 mg total) by mouth daily. For alzheimers disease   FERROUS SULFATE 325 (65 FE) MG TABLET    Take 325 mg by mouth daily with breakfast.   HYDROCODONE-ACETAMINOPHEN (NORCO) 5-325 MG PER TABLET    Take one tablet by mouth every 6 hours as needed for breakthrough pain. If unrelieved with scheduled Hydrocodone. Not to exceed 3000mg  of APAP from all sources/24hr   IPRATROPIUM-ALBUTEROL (DUONEB)  0.5-2.5 (3) MG/3ML SOLN    Take 3 mLs by nebulization every 6 (six) hours as needed (shortness of breath.).   LISINOPRIL (PRINIVIL,ZESTRIL) 10 MG TABLET    Take 10 mg by mouth daily.   METOPROLOL (LOPRESSOR) 25 MG TABLET    Take 1 tablet (25 mg total) by mouth 2 (two) times daily. If pulse <50 or SBP <100 hold med and notify MD.   MIRTAZAPINE (REMERON) 7.5 MG TABLET    Take 15 mg by mouth at bedtime.    MULTIPLE VITAMINS-MINERALS (DECUBI-VITE) CAPS    Take 2 capsules by mouth daily.   NUTRITIONAL SUPPLEMENTS (TWOCAL HN) LIQD    Take 120 mLs by mouth 3 (three) times daily.    SENNOSIDES-DOCUSATE SODIUM (SENOKOT-S) 8.6-50 MG TABLET    Take 1 tablet by mouth daily.   SUCRALFATE (CARAFATE) 1 GM/10ML SUSPENSION    Take 1 g by mouth 4 (four) times daily -  before meals and  at bedtime.   VITAMIN B-12 (CYANOCOBALAMIN) 1000 MCG TABLET    Take 1,000 mcg by mouth every other day.  Modified Medications   No medications on file  Discontinued Medications   MULTIPLE VITAMIN (MULTIVITAMIN WITH MINERALS) TABS TABLET    Take 1 tablet by mouth daily.     SIGNIFICANT DIAGNOSTIC EXAMS   12-14-13: right hip x-ray: 1. Acute, impacted right femoral neck fracture. The distal fracture fragment is anteriorly displaced approximately 2 cm on the cross-table lateral view.  2. Decreased bony mineralization.  3. Cannot exclude an L3 compression fracture. No prior images of the lumbar spine available.  4. Prominent amount of stool in the rectum.  12-14-13: chest x-ray: Right basilar atelectasis.  01-27-14: chest x-ray: density right lung base suggestive of pneumonia possible associated effusion.   01-29-14: chest x-ray: right lower lobe plate like atelectasis in comparison no significant change. Atherosclerotic aorta. Moderate demineralization     03-13-14: chest x-ray: no acute cardiopulmonary disease process. Chronic interstial   05-13-14: right foot x-ray: no acute osseous abnormality  06-04-14; right ABI: moderate plaque right superficial femoral artery stenosis; infrapopliteal arterial occlusion. Right leg changes are in the severe ischemia range.      LABS REVIEWED:   12-07-13: urine culture: e-coli: zosyn 12-10-13: wbc 6.5; hgb 10.8; hct 35.4; mcv 91.7; ;plt 223; glucose 137; bun 18.2; creat 0.62; k+4.2; na++142 12-12-13: chol 180; ldl 102; trig 170; liver normal albumin 4.2 12-14-13: wbc 12.4; hgb 11.7; hct 35.6; mcv 89.9; plt 239; glucose 111; bun 32; creat 0.84; k+4.1 ;n++142; urine culture: no growth  12-17-13: wbc 11.7; hgb 8.7; hct 26.9; mcv 90.9; plt 199; glucose 90; bun 21; creat 0.7; k+3.9; na++146; liver normal albumin 2.6  12-18-13: glucose 93; bun 16.9; creat 0.53; k+3.9; na++145 12-22-13: wbc 9.0; hgb 7.8; hct 25.7; mcv 92.2 ;plt 346 12-24-13: glucose 78; bun  17.5; creat 0.55; k+4.1; na++143  01-29-14: wbc 8.6; hgb 10.4; hct 33.9; mcv 84.4; plt 369; glucose 98; bun 10.3; creat 0.52; k+3.9; na++144 03-13-14: wbc 12.3; hgb 9.6; hct 31.8; mcv 82.5 ;plt 229; glucose 81; bun 35.3;creat 0.75; k+4.0; na++146; liver norm albumin 3.4 03-17-14: right heel culture: staph aureus: septra ds  03-20-14: pre-albumin 12 05-13-14: wbc 10.8; hgb 10.7; hct 35.3; mcv 81.6; plt 290; glucose 153; bun 23.4; creat 0.68; k+4.3; na++145; liver normal albumin 3.8; pre-albumin 16  08-30-14: stool + c-diff; + guaiac 09-24-14: stool guaiac neg     Review of Systems Unable to perform ROS: Dementia      Physical Exam  Constitutional: No distress.  Frail   Neck: Neck supple. No JVD present. No thyromegaly present.  Cardiovascular: Normal rate, regular rhythm and intact distal pulses.   Respiratory: Effort normal and breath sounds normal. No respiratory distress. She has no wheezes.  GI: Soft. Bowel sounds are normal. She exhibits no distension. There is no tenderness.  Musculoskeletal: She exhibits no edema.  Is able to move all extremities; is status post right femoral neck fracture 11/2014 Neurological: She is alert.  Skin: Skin is warm and dry. She is not diaphoretic. Right heel ulceration: 1.8 x 1.5 x 0.2 cm 100% granulating tissue.no signs of infection       ASSESSMENT/ PLAN:  1. Right heel ulceration due to severe PAD: will continue current treatment per facility protocol.   Will continue vicodin 5/325 mg every 6 hours and every 6 hours as needed; and will monitor her status.   2. Hypertension: will continue norvasc 10 mg daily; lisinopril 10 mg daily; lopressor 25 mg twice daily; will continue to monitor  3. Dementia: is presently without change in status; will continue aricept 5 mg daily and will monitor  4. CAD: no complaint of chest pain present; will continue asa 325 mg daily and will monitor   5. GERD: will continue carafate 1 gm four times daily;   6.  Anemia: will continue iron daily; hgb is 10.7  7. Constipation: will continue senna s nightly   8. Weight loss: her current weight is 125 pounds; she continues with supplements and prostat per facility protocol. Her weight at this time is presently stable.  Her pre-albumin is [place] [facility]  Contact [phone] Monday through Friday 8am- 5pm  After hours call [phone]

## 2014-12-02 ENCOUNTER — Non-Acute Institutional Stay (SKILLED_NURSING_FACILITY): Payer: Medicare Other | Admitting: Adult Health

## 2014-12-02 ENCOUNTER — Encounter: Payer: Self-pay | Admitting: Adult Health

## 2014-12-02 DIAGNOSIS — I2510 Atherosclerotic heart disease of native coronary artery without angina pectoris: Secondary | ICD-10-CM

## 2014-12-02 DIAGNOSIS — R634 Abnormal weight loss: Secondary | ICD-10-CM | POA: Diagnosis not present

## 2014-12-02 DIAGNOSIS — K219 Gastro-esophageal reflux disease without esophagitis: Secondary | ICD-10-CM | POA: Diagnosis not present

## 2014-12-02 DIAGNOSIS — L97411 Non-pressure chronic ulcer of right heel and midfoot limited to breakdown of skin: Secondary | ICD-10-CM | POA: Diagnosis not present

## 2014-12-02 DIAGNOSIS — G309 Alzheimer's disease, unspecified: Secondary | ICD-10-CM

## 2014-12-02 DIAGNOSIS — I739 Peripheral vascular disease, unspecified: Secondary | ICD-10-CM

## 2014-12-02 DIAGNOSIS — F22 Delusional disorders: Secondary | ICD-10-CM | POA: Diagnosis not present

## 2014-12-02 DIAGNOSIS — K5900 Constipation, unspecified: Secondary | ICD-10-CM | POA: Diagnosis not present

## 2014-12-02 DIAGNOSIS — D509 Iron deficiency anemia, unspecified: Secondary | ICD-10-CM

## 2014-12-02 DIAGNOSIS — F0281 Dementia in other diseases classified elsewhere with behavioral disturbance: Secondary | ICD-10-CM | POA: Diagnosis not present

## 2014-12-02 DIAGNOSIS — I119 Hypertensive heart disease without heart failure: Secondary | ICD-10-CM | POA: Diagnosis not present

## 2014-12-02 DIAGNOSIS — F02818 Dementia in other diseases classified elsewhere, unspecified severity, with other behavioral disturbance: Secondary | ICD-10-CM

## 2014-12-02 DIAGNOSIS — G301 Alzheimer's disease with late onset: Secondary | ICD-10-CM

## 2014-12-02 NOTE — Progress Notes (Signed)
Patient ID: [name], female   DOB: 09-Jun-1922, 81 y.o.   MRN: [id number]    Facility: [facility]      Allergies  Allergen Reactions  . Captopril-Hydrochlorothiazide Other (See Comments)    Reaction unknown  . Niacin And Related Other (See Comments)    Reaction unknown  . Urispas [Flavoxate] Other (See Comments)    Reaction unknown  . Verapamil Other (See Comments)    Reaction unknown    Chief Complaint  Patient presents with  . Annual Exam    HPI:  She is a long term resident of this facility being seen for her annual exam. Overall there is little change in her status. Her heel ulceration continues to slowly improve. She is unable to fully participate in the hpi or ros. Staff reports today that when she woke up early this morning her hands were shaking; there is not tremors noted at this time. She has not had any hospitalizations over the past year. She is not a candidate for a dexa; colonoscopy; mammogram.     Past Medical History  Diagnosis Date  . Diverticulosis of colon (without mention of hemorrhage) 06/24/2006  . Vitamin B12 deficiency   . Alzheimer's disease   . HTN (hypertension)   . Depression   . PVD (peripheral vascular disease) ([place])   . CAD (coronary artery disease)   . GERD (gastroesophageal reflux disease)   . H. pylori infection 2004  . Presbyesophagus   . Iron deficiency anemia, unspecified 01/02/2012  . Personal history of fall 01/04/2009  . Unspecified constipation   . Hemorrhage of rectum and anus   . Chest pain, unspecified   . Acute pharyngitis   . Edema   . Cataract   . HLD (hyperlipidemia)   . Osteoporosis, unspecified   . Carcinoma of cecum ([place]) 1992    History of  . Malignant neoplasm of colon, unspecified site     Past Surgical History  Procedure Laterality Date  . Gallbladder surgery    . Abdominal hysterectomy  1995    with bso  . Right colectomy    . Cataract extraction  2001  . Cholecystectomy Right     . Hip arthroplasty Right 12/15/2013    Procedure: HEMIARTHROPLASTY RIGHTHIP;  Surgeon: Newt Minion, MD;  Location: [place];  Service: Orthopedics;  Laterality: Right;    Family History  Problem Relation Age of Onset  . Heart disease Mother   . Colon cancer Neg Hx      Social History   Social History  . Marital Status: Widowed    Spouse Name: N/A  . Number of Children: 2  . Years of Education: N/A   Occupational History  . Retired    Social History Main Topics  . Smoking status: Former Smoker    Types: Cigarettes  . Smokeless tobacco: Never Used  . Alcohol Use: No  . Drug Use: No  . Sexual Activity: No   Other Topics Concern  . Not on file   Social History Narrative     VITAL SIGNS BP 121/69 mmHg  Pulse 84  Ht 4\' 10"  (1.473 m)  Wt 125 lb (56.7 kg)  BMI 26.13 kg/m2  SpO2 98%  Patient's Medications  New Prescriptions   No medications on file  Previous Medications   ACETAMINOPHEN (TYLENOL) 500 MG TABLET    Take 1,000 mg by mouth every 8 (eight) hours as needed for mild pain.   AMINO ACIDS-PROTEIN HYDROLYS (FEEDING SUPPLEMENT, PRO-STAT  SUGAR FREE 64,) LIQD    Take 30 mLs by mouth 3 (three) times daily with meals.   AMLODIPINE (NORVASC) 10 MG TABLET    Take 10 mg by mouth daily. For hypertension   ASPIRIN EC 325 MG TABLET    Take 1 tablet (325 mg total) by mouth daily.   CALCIUM CARBONATE-VITAMIN D (CALCIUM-VITAMIN D) 500-200 MG-UNIT PER TABLET    Take 1 tablet by mouth daily. f   CHOLECALCIFEROL (VITAMIN D) 2000 UNITS TABLET    Take 2,000 Units by mouth daily. For osteoporosis   DONEPEZIL (ARICEPT) 10 MG TABLET    Take 0.5 tablets (5 mg total) by mouth daily. For alzheimers disease   FERROUS SULFATE 325 (65 FE) MG TABLET    Take 325 mg by mouth daily with breakfast.   HYDROCODONE-ACETAMINOPHEN (NORCO) 5-325 MG PER TABLET    Take one tablet by mouth every 6 hours as needed for breakthrough pain. If unrelieved with scheduled Hydrocodone. Not to exceed 3000mg  of  APAP from all sources/24hr   IPRATROPIUM-ALBUTEROL (DUONEB) 0.5-2.5 (3) MG/3ML SOLN    Take 3 mLs by nebulization every 6 (six) hours as needed (shortness of breath.).   LISINOPRIL (PRINIVIL,ZESTRIL) 10 MG TABLET    Take 10 mg by mouth daily.   METOPROLOL (LOPRESSOR) 25 MG TABLET    Take 1 tablet (25 mg total) by mouth 2 (two) times daily. If pulse <50 or SBP <100 hold med and notify MD.   MIRTAZAPINE (REMERON) 7.5 MG TABLET    Take 15 mg by mouth at bedtime.    MULTIPLE VITAMINS-MINERALS (DECUBI-VITE) CAPS    Take 2 capsules by mouth daily.   NUTRITIONAL SUPPLEMENTS (TWOCAL HN) LIQD    Take 120 mLs by mouth 3 (three) times daily.    SACCHAROMYCES BOULARDII (FLORASTOR) 250 MG CAPSULE    Take 250 mg by mouth 2 (two) times daily.   SENNOSIDES-DOCUSATE SODIUM (SENOKOT-S) 8.6-50 MG TABLET    Take 1 tablet by mouth daily.   SUCRALFATE (CARAFATE) 1 GM/10ML SUSPENSION    Take 1 g by mouth 4 (four) times daily -  before meals and at bedtime.   VITAMIN B-12 (CYANOCOBALAMIN) 1000 MCG TABLET    Take 1,000 mcg by mouth every other day.  Modified Medications   No medications on file  Discontinued Medications   No medications on file     SIGNIFICANT DIAGNOSTIC EXAMS   12-14-13: right hip x-ray: 1. Acute, impacted right femoral neck fracture. The distal fracture fragment is anteriorly displaced approximately 2 cm on the cross-table lateral view.  2. Decreased bony mineralization.  3. Cannot exclude an L3 compression fracture. No prior images of the lumbar spine available.  4. Prominent amount of stool in the rectum.  12-14-13: chest x-ray: Right basilar atelectasis.  01-27-14: chest x-ray: density right lung base suggestive of pneumonia possible associated effusion.   01-29-14: chest x-ray: right lower lobe plate like atelectasis in comparison no significant change. Atherosclerotic aorta. Moderate demineralization     03-13-14: chest x-ray: no acute cardiopulmonary disease process. Chronic interstial    05-13-14: right foot x-ray: no acute osseous abnormality  06-04-14; right ABI: moderate plaque right superficial femoral artery stenosis; infrapopliteal arterial occlusion. Right leg changes are in the severe ischemia range.      LABS REVIEWED:   12-07-13: urine culture: e-coli: zosyn 12-10-13: wbc 6.5; hgb 10.8; hct 35.4; mcv 91.7; ;plt 223; glucose 137; bun 18.2; creat 0.62; k+4.2; na++142 12-12-13: chol 180; ldl 102; trig 170; liver normal albumin  4.2 12-14-13: wbc 12.4; hgb 11.7; hct 35.6; mcv 89.9; plt 239; glucose 111; bun 32; creat 0.84; k+4.1 ;na++142; urine culture: no growth  12-17-13: wbc 11.7; hgb 8.7; hct 26.9; mcv 90.9; plt 199; glucose 90; bun 21; creat 0.7; k+3.9; na++146; liver normal albumin 2.6  12-18-13: glucose 93; bun 16.9; creat 0.53; k+3.9; na++145 12-22-13: wbc 9.0; hgb 7.8; hct 25.7; mcv 92.2 ;plt 346 12-24-13: glucose 78; bun 17.5; creat 0.55; k+4.1; na++143  01-29-14: wbc 8.6; hgb 10.4; hct 33.9; mcv 84.4; plt 369; glucose 98; bun 10.3; creat 0.52; k+3.9; na++144 03-13-14: wbc 12.3; hgb 9.6; hct 31.8; mcv 82.5 ;plt 229; glucose 81; bun 35.3;creat 0.75; k+4.0; na++146; liver norm albumin 3.4 03-17-14: right heel culture: staph aureus: septra ds  03-20-14: pre-albumin 12 05-13-14: wbc 10.8; hgb 10.7; hct 35.3; mcv 81.6; plt 290; glucose 153; bun 23.4; creat 0.68; k+4.3; na++145; liver normal albumin 3.8; pre-albumin 16  08-30-14: stool + c-diff; + guaiac 09-24-14: stool guaiac neg      Review of Systems Unable to perform ROS: Dementia    Physical Exam Constitutional: No distress.  Frail   Neck: Neck supple. No JVD present. No thyromegaly present.  Cardiovascular: Normal rate, regular rhythm and intact distal pulses.   Respiratory: Effort normal and breath sounds normal. No respiratory distress. She has no wheezes.  GI: Soft. Bowel sounds are normal. She exhibits no distension. There is no tenderness.  Musculoskeletal: She exhibits no edema.  Is able to move all  extremities; is status post right femoral neck fracture 11/2014 Neurological: She is alert.  Skin: Skin is warm and dry. She is not diaphoretic. Right heel ulceration: 1.8 x 1.3 x 0.2 cm 100% granulating tissue.no signs of infection        ASSESSMENT/ PLAN:  1. Right heel ulceration due to severe PAD: will continue current treatment per facility protocol. Her wound continues to improve slowly.   Will continue vicodin 5/325 mg every 6 hours and every 6 hours as needed; will continue her nutritional support and will monitor her status.   2. Hypertension: will continue norvasc 10 mg daily; lisinopril 10 mg daily; lopressor 25 mg twice daily; will continue to monitor  3. Dementia: is presently without change in status; will continue aricept 5 mg daily and will monitor  4. CAD: no complaint of chest pain present; will continue asa 325 mg daily and will monitor   5. GERD: will continue carafate 1 gm four times daily;   6. Anemia: will continue iron daily; hgb is 10.7  7. Constipation: will continue senna s nightly   8. Weight loss: her current weight is 125 pounds; she continues with supplements and prostat per facility protocol. Her weight at this time is presently stable.  Her pre-albumin is 16     Time spent with patient  45   minutes >50% time spent counseling; reviewing medical record; tests; labs; and developing future plan of care   Ok Edwards NP [facility]  Contact [phone] Monday through Friday 8am- 5pm  After hours call [phone]

## 2014-12-10 DIAGNOSIS — A047 Enterocolitis due to Clostridium difficile: Secondary | ICD-10-CM | POA: Diagnosis not present

## 2014-12-23 ENCOUNTER — Non-Acute Institutional Stay (SKILLED_NURSING_FACILITY): Payer: Medicare Other | Admitting: Adult Health

## 2014-12-23 ENCOUNTER — Encounter: Payer: Self-pay | Admitting: Adult Health

## 2014-12-23 DIAGNOSIS — D509 Iron deficiency anemia, unspecified: Secondary | ICD-10-CM

## 2014-12-23 DIAGNOSIS — F22 Delusional disorders: Secondary | ICD-10-CM

## 2014-12-23 DIAGNOSIS — I25119 Atherosclerotic heart disease of native coronary artery with unspecified angina pectoris: Secondary | ICD-10-CM

## 2014-12-23 DIAGNOSIS — K219 Gastro-esophageal reflux disease without esophagitis: Secondary | ICD-10-CM | POA: Diagnosis not present

## 2014-12-23 DIAGNOSIS — F0281 Dementia in other diseases classified elsewhere with behavioral disturbance: Secondary | ICD-10-CM

## 2014-12-23 DIAGNOSIS — R634 Abnormal weight loss: Secondary | ICD-10-CM

## 2014-12-23 DIAGNOSIS — G309 Alzheimer's disease, unspecified: Secondary | ICD-10-CM | POA: Diagnosis not present

## 2014-12-23 DIAGNOSIS — I119 Hypertensive heart disease without heart failure: Secondary | ICD-10-CM | POA: Diagnosis not present

## 2014-12-23 DIAGNOSIS — I739 Peripheral vascular disease, unspecified: Secondary | ICD-10-CM

## 2014-12-23 DIAGNOSIS — K5900 Constipation, unspecified: Secondary | ICD-10-CM

## 2014-12-23 DIAGNOSIS — F02818 Dementia in other diseases classified elsewhere, unspecified severity, with other behavioral disturbance: Secondary | ICD-10-CM

## 2014-12-23 DIAGNOSIS — G301 Alzheimer's disease with late onset: Secondary | ICD-10-CM

## 2014-12-23 NOTE — Progress Notes (Signed)
Patient ID: [name], female   DOB: 11-12-1922, 81 y.o.   MRN: [id number]   Facility:  [facility]      Allergies  Allergen Reactions  . Captopril-Hydrochlorothiazide Other (See Comments)    Reaction unknown  . Niacin And Related Other (See Comments)    Reaction unknown  . Urispas [Flavoxate] Other (See Comments)    Reaction unknown  . Verapamil Other (See Comments)    Reaction unknown    Chief Complaint  Patient presents with  . Medical Management of Chronic Issues    HPI:  She is a long term resident of this facility being seen for the management of her chronic illnesses. Overall there is little change in her status. Her heel ulceration has resolved. She has gained weight from 116 pounds in May to her current weight of 128 pounds. She is unable to fully participate in the hpi or ros; but tells me that she is feeling good. There are no nursing concerns at this time.    Past Medical History  Diagnosis Date  . Diverticulosis of colon (without mention of hemorrhage) 06/24/2006  . Vitamin B12 deficiency   . Alzheimer's disease   . HTN (hypertension)   . Depression   . PVD (peripheral vascular disease) ([place])   . CAD (coronary artery disease)   . GERD (gastroesophageal reflux disease)   . H. pylori infection 2004  . Presbyesophagus   . Iron deficiency anemia, unspecified 01/02/2012  . Personal history of fall 01/04/2009  . Unspecified constipation   . Hemorrhage of rectum and anus   . Chest pain, unspecified   . Acute pharyngitis   . Edema   . Cataract   . HLD (hyperlipidemia)   . Osteoporosis, unspecified   . Carcinoma of cecum ([place]) 1992    History of  . Malignant neoplasm of colon, unspecified site   . Fracture of femoral neck, right ([place]) 12/21/2013  . Dysphagia 08/07/2011    Past Surgical History  Procedure Laterality Date  . Gallbladder surgery    . Abdominal hysterectomy  1995    with bso  . Right colectomy    . Cataract extraction  2001  .  Cholecystectomy Right   . Hip arthroplasty Right 12/15/2013    Procedure: HEMIARTHROPLASTY RIGHTHIP;  Surgeon: Newt Minion, MD;  Location: [place];  Service: Orthopedics;  Laterality: Right;    VITAL SIGNS BP 164/70 mmHg  Pulse 74  Ht 4\' 10"  (1.473 m)  Wt 128 lb (58.06 kg)  BMI 26.76 kg/m2  SpO2 97%  Patient's Medications  New Prescriptions   No medications on file  Previous Medications   AMINO ACIDS-PROTEIN HYDROLYS (FEEDING SUPPLEMENT, PRO-STAT SUGAR FREE 64,) LIQD    Take 30 mLs by mouth 3 (three) times daily with meals.   AMLODIPINE (NORVASC) 10 MG TABLET    Take 10 mg by mouth daily. For hypertension   ASPIRIN EC 325 MG TABLET    Take 1 tablet (325 mg total) by mouth daily.   CHOLECALCIFEROL (VITAMIN D) 2000 UNITS TABLET    Take 2,000 Units by mouth daily. For osteoporosis   DONEPEZIL (ARICEPT) 10 MG TABLET    Take 0.5 tablets (5 mg total) by mouth daily. For alzheimers disease   FERROUS SULFATE 325 (65 FE) MG TABLET    Take 325 mg by mouth daily with breakfast.   HYDROCODONE-ACETAMINOPHEN (NORCO) 5-325 MG PER TABLET    Take one tablet by mouth every 6 hours as needed for breakthrough pain. If  unrelieved with scheduled Hydrocodone. Not to exceed 3000mg  of APAP from all sources/24hr   IPRATROPIUM-ALBUTEROL (DUONEB) 0.5-2.5 (3) MG/3ML SOLN    Take 3 mLs by nebulization every 6 (six) hours as needed (shortness of breath.).   LISINOPRIL (PRINIVIL,ZESTRIL) 10 MG TABLET    Take 10 mg by mouth daily.   METOPROLOL (LOPRESSOR) 25 MG TABLET    Take 1 tablet (25 mg total) by mouth 2 (two) times daily. If pulse <50 or SBP <100 hold med and notify MD.   MIRTAZAPINE (REMERON) 7.5 MG TABLET    Take 15 mg by mouth at bedtime.    NUTRITIONAL SUPPLEMENTS (TWOCAL HN) LIQD    Take 120 mLs by mouth 3 (three) times daily.    SENNOSIDES-DOCUSATE SODIUM (SENOKOT-S) 8.6-50 MG TABLET    Take 1 tablet by mouth daily.   SUCRALFATE (CARAFATE) 1 GM/10ML SUSPENSION    Take 1 g by mouth 4 (four) times daily -   before meals and at bedtime.   VITAMIN B-12 (CYANOCOBALAMIN) 1000 MCG TABLET    Take 1,000 mcg by mouth every other day.  Modified Medications   No medications on file  Discontinued Medications     SIGNIFICANT DIAGNOSTIC EXAMS   12-14-13: right hip x-ray: 1. Acute, impacted right femoral neck fracture. The distal fracture fragment is anteriorly displaced approximately 2 cm on the cross-table lateral view.  2. Decreased bony mineralization.  3. Cannot exclude an L3 compression fracture. No prior images of the lumbar spine available.  4. Prominent amount of stool in the rectum.  12-14-13: chest x-ray: Right basilar atelectasis.  01-27-14: chest x-ray: density right lung base suggestive of pneumonia possible associated effusion.   01-29-14: chest x-ray: right lower lobe plate like atelectasis in comparison no significant change. Atherosclerotic aorta. Moderate demineralization     03-13-14: chest x-ray: no acute cardiopulmonary disease process. Chronic interstial   05-13-14: right foot x-ray: no acute osseous abnormality  06-04-14; right ABI: moderate plaque right superficial femoral artery stenosis; infrapopliteal arterial occlusion. Right leg changes are in the severe ischemia range.      LABS REVIEWED:   12-22-13: wbc 9.0; hgb 7.8; hct 25.7; mcv 92.2 ;plt 346 12-24-13: glucose 78; bun 17.5; creat 0.55; k+4.1; na++143  01-29-14: wbc 8.6; hgb 10.4; hct 33.9; mcv 84.4; plt 369; glucose 98; bun 10.3; creat 0.52; k+3.9; na++144 03-13-14: wbc 12.3; hgb 9.6; hct 31.8; mcv 82.5 ;plt 229; glucose 81; bun 35.3;creat 0.75; k+4.0; na++146; liver norm albumin 3.4 03-17-14: right heel culture: staph aureus: septra ds  03-20-14: pre-albumin 12 05-13-14: wbc 10.8; hgb 10.7; hct 35.3; mcv 81.6; plt 290; glucose 153; bun 23.4; creat 0.68; k+4.3; na++145; liver normal albumin 3.8; pre-albumin 16  08-30-14: stool + c-diff; + guaiac 09-24-14: stool guaiac neg  12-10-14: guaiac for stool: neg; lactoferrin: +       Review of Systems Unable to perform ROS: Dementia    Physical Exam Constitutional: No distress.  Frail   Neck: Neck supple. No JVD present. No thyromegaly present.  Cardiovascular: Normal rate, regular rhythm and intact distal pulses.   Respiratory: Effort normal and breath sounds normal. No respiratory distress. She has no wheezes.  GI: Soft. Bowel sounds are normal. She exhibits no distension. There is no tenderness.  Musculoskeletal: She exhibits no edema.  Is able to move all extremities; is status post right femoral neck fracture 11/2013 Neurological: She is alert.  Skin: Skin is warm and dry. She is not diaphoretic. Right heel ulceration: has resolved.  ASSESSMENT/ PLAN:  1.  severe PAD:  Will continue vicodin 5/325 mg every 6 hours and every 6 hours as needed;  2. Hypertension: will continue norvasc 10 mg daily; lisinopril 10 mg daily; lopressor 25 mg twice daily; will continue to monitor  3. Dementia: is presently without change in status; will continue aricept 5 mg daily and will monitor  4. CAD: no complaint of chest pain present; will continue asa 325 mg daily and will monitor   5. GERD: will continue carafate 1 gm four times daily;   6. Anemia: will continue iron daily; hgb is 10.7  7. Constipation: will continue senna s nightly   8. Weight loss: her current weight is 128 pounds and is slowly gaining weight. Her heel ulceration has resolved; will stop the prostat; will stop her supplements due to her weight gain; and will monitor          Ok Edwards NP [facility]  Contact [phone] Monday through Friday 8am- 5pm  After hours call [phone]

## 2014-12-24 DIAGNOSIS — I10 Essential (primary) hypertension: Secondary | ICD-10-CM | POA: Diagnosis not present

## 2014-12-24 DIAGNOSIS — D649 Anemia, unspecified: Secondary | ICD-10-CM | POA: Diagnosis not present

## 2014-12-24 DIAGNOSIS — R6889 Other general symptoms and signs: Secondary | ICD-10-CM | POA: Diagnosis not present

## 2014-12-24 DIAGNOSIS — R509 Fever, unspecified: Secondary | ICD-10-CM | POA: Diagnosis not present

## 2014-12-24 DIAGNOSIS — R197 Diarrhea, unspecified: Secondary | ICD-10-CM | POA: Diagnosis not present

## 2014-12-24 DIAGNOSIS — F39 Unspecified mood [affective] disorder: Secondary | ICD-10-CM | POA: Diagnosis not present

## 2014-12-24 LAB — CBC AND DIFFERENTIAL
HCT: 34 % — AB (ref 36–46)
Hemoglobin: 10.3 g/dL — AB (ref 12.0–16.0)
PLATELETS: 201 10*3/uL (ref 150–399)
WBC: 6.5 10^3/mL

## 2014-12-24 LAB — BASIC METABOLIC PANEL
BUN: 18 mg/dL (ref 4–21)
CREATININE: 0.6 mg/dL (ref 0.5–1.1)
Glucose: 68 mg/dL
POTASSIUM: 4.1 mmol/L (ref 3.4–5.3)
Sodium: 145 mmol/L (ref 137–147)

## 2015-01-27 ENCOUNTER — Non-Acute Institutional Stay (SKILLED_NURSING_FACILITY): Payer: Medicare Other | Admitting: Internal Medicine

## 2015-01-27 DIAGNOSIS — I119 Hypertensive heart disease without heart failure: Secondary | ICD-10-CM | POA: Diagnosis not present

## 2015-01-27 DIAGNOSIS — G309 Alzheimer's disease, unspecified: Secondary | ICD-10-CM

## 2015-01-27 DIAGNOSIS — B351 Tinea unguium: Secondary | ICD-10-CM | POA: Diagnosis not present

## 2015-01-27 DIAGNOSIS — F0281 Dementia in other diseases classified elsewhere with behavioral disturbance: Secondary | ICD-10-CM

## 2015-01-27 DIAGNOSIS — F22 Delusional disorders: Secondary | ICD-10-CM

## 2015-01-27 DIAGNOSIS — I70203 Unspecified atherosclerosis of native arteries of extremities, bilateral legs: Secondary | ICD-10-CM | POA: Diagnosis not present

## 2015-01-27 DIAGNOSIS — G301 Alzheimer's disease with late onset: Secondary | ICD-10-CM

## 2015-01-27 DIAGNOSIS — I25119 Atherosclerotic heart disease of native coronary artery with unspecified angina pectoris: Secondary | ICD-10-CM

## 2015-01-27 DIAGNOSIS — M79674 Pain in right toe(s): Secondary | ICD-10-CM | POA: Diagnosis not present

## 2015-01-27 DIAGNOSIS — M79675 Pain in left toe(s): Secondary | ICD-10-CM | POA: Diagnosis not present

## 2015-01-31 ENCOUNTER — Encounter: Payer: Self-pay | Admitting: Internal Medicine

## 2015-01-31 NOTE — Assessment & Plan Note (Signed)
is presently without change in status; will continue aricept 5 mg daily and will monitor

## 2015-01-31 NOTE — Assessment & Plan Note (Signed)
:   no complaint of chest pain present; will continue asa 325 mg daily, metoprolol 25 mg BID and will monitor

## 2015-01-31 NOTE — Assessment & Plan Note (Signed)
will continue norvasc 10 mg daily; lisinopril 10 mg daily; lopressor 25 mg twice daily; will continue to monitor

## 2015-01-31 NOTE — Progress Notes (Signed)
MRN: [id number] Name: [name]  Sex: female Age: 82 y.o. DOB: [birth year]  [facility]idelity #: [facility] Facility/Room:105 Level Of Care: SNF Provider: Inocencio Homes D Emergency Contacts: Extended Emergency Contact Information Primary Emergency Contact: [name] [place] Mobile Phone: [phone] Relation: Son Secondary Emergency Contact: [name],[name] Address: [address]          [place], [place] [address] [place] Phone: [phone] Relation: Other  Code Status:   Allergies: Captopril-hydrochlorothiazide; Niacin and related; Urispas; and Verapamil  Chief Complaint  Patient presents with  . Medical Management of Chronic Issues    HPI: Patient is 82 y.o. female who dementia, HTN, depression, PVD, GERD, anemia wjo is being seen for routine issues of HTN, dementia and CAD.  Past Medical History  Diagnosis Date  . Diverticulosis of colon (without mention of hemorrhage) 06/24/2006  . Vitamin B12 deficiency   . Alzheimer's disease   . HTN (hypertension)   . Depression   . PVD (peripheral vascular disease) ([place])   . CAD (coronary artery disease)   . GERD (gastroesophageal reflux disease)   . H. pylori infection 2004  . Presbyesophagus   . Iron deficiency anemia, unspecified 01/02/2012  . Personal history of fall 01/04/2009  . Unspecified constipation   . Hemorrhage of rectum and anus   . Chest pain, unspecified   . Acute pharyngitis   . Edema   . Cataract   . HLD (hyperlipidemia)   . Osteoporosis, unspecified   . Carcinoma of cecum ([place]) 1992    History of  . Malignant neoplasm of colon, unspecified site   . Fracture of femoral neck, right ([place]) 12/21/2013  . Dysphagia 08/07/2011    Past Surgical History  Procedure Laterality Date  . Gallbladder surgery    . Abdominal hysterectomy  1995    with bso  . Right colectomy    . Cataract extraction  2001  . Cholecystectomy Right   . Hip arthroplasty Right 12/15/2013     Procedure: HEMIARTHROPLASTY RIGHTHIP;  Surgeon: Newt Minion, MD;  Location: [facility];  Service: Orthopedics;  Laterality: Right;      Medication List       This list is accurate as of: 01/27/15 11:59 PM.  Always use your most recent med list.               amLODipine 10 MG tablet  Commonly known as:  NORVASC  Take 10 mg by mouth daily. For hypertension     aspirin EC 325 MG tablet  Take 1 tablet (325 mg total) by mouth daily.     donepezil 10 MG tablet  Commonly known as:  ARICEPT  Take 0.5 tablets (5 mg total) by mouth daily. For alzheimers disease     ferrous sulfate 325 (65 FE) MG tablet  Take 325 mg by mouth daily with breakfast.     HYDROcodone-acetaminophen 5-325 MG tablet  Commonly known as:  NORCO  Take one tablet by mouth every 6 hours as needed for breakthrough pain. If unrelieved with scheduled Hydrocodone. Not to exceed 3000mg  of APAP from all sources/24hr     ipratropium-albuterol 0.5-2.5 (3) MG/3ML Soln  Commonly known as:  DUONEB  Take 3 mLs by nebulization every 6 (six) hours as needed (shortness of breath.).     lisinopril 10 MG tablet  Commonly known as:  PRINIVIL,ZESTRIL  Take 10 mg by mouth daily.     metoprolol tartrate 25 MG tablet  Commonly known as:  LOPRESSOR  Take 1 tablet (25 mg total) by mouth 2 (two) times daily. If pulse <50 or SBP <100 hold med and notify MD.     mirtazapine 7.5 MG tablet  Commonly known as:  REMERON  Take 15 mg by mouth at bedtime.     sennosides-docusate sodium 8.6-50 MG tablet  Commonly known as:  SENOKOT-S  Take 1 tablet by mouth daily.     sucralfate 1 GM/10ML suspension  Commonly known as:  CARAFATE  Take 1 g by mouth 4 (four) times daily -  before meals and at bedtime.     vitamin B-12 1000 MCG tablet  Commonly known as:  CYANOCOBALAMIN  Take 1,000 mcg by mouth every other day.     Vitamin D 2000 units tablet  Take 2,000 Units by mouth daily. For osteoporosis        No orders of the defined types were  placed in this encounter.    Immunization History  Administered Date(s) Administered  . Influenza Whole 10/22/2004, 10/30/2012  . Influenza-Unspecified 11/02/2013, 10/29/2014  . PPD Test 08/03/2009  . Pneumococcal Polysaccharide-23 01/23/1999  . Pneumococcal-Unspecified 04/07/2012    Social History  Substance Use Topics  . Smoking status: Former Smoker    Types: Cigarettes  . Smokeless tobacco: Never Used  . Alcohol Use: No    Review of Systems  DATA OBTAINED: from nurse  GENERAL:  no fevers, fatigue, appetite changes SKIN: No itching, rash HEENT: No complaint RESPIRATORY: No cough, wheezing, SOB CARDIAC: No chest pain, palpitations, lower extremity edema  GI: No abdominal pain, No N/V/D or constipation, No heartburn or reflux  GU: No dysuria, frequency or urgency, or incontinence  MUSCULOSKELETAL: No unrelieved bone/joint pain NEUROLOGIC: No headache, dizziness  PSYCHIATRIC: No overt anxiety or sadness  Filed Vitals:   01/31/15 1625  BP: 144/74  Pulse: 99  Temp: 98.1 F (36.7 C)  Resp: 16    Physical Exam  GENERAL APPEARANCE: Alert, min conversant, No acute distress  SKIN: No diaphoresis rash HEENT: Unremarkable RESPIRATORY: Breathing is even, unlabored. Lung sounds are clear   CARDIOVASCULAR: Heart RRR no murmurs, rubs or gallops. No peripheral edema  GASTROINTESTINAL: Abdomen is soft, non-tender, not distended w/ normal bowel sounds.  GENITOURINARY: Bladder non tender, not distended  MUSCULOSKELETAL: No abnormal joints or musculature NEUROLOGIC: Cranial nerves 2-12 grossly intact. Moves all extremities PSYCHIATRIC: Mood and affect appropriate to situation with dementia, no behavioral issues  Patient Active Problem List   Diagnosis Date Noted  . Benign hypertensive heart disease without heart failure 12/02/2014  . PAD (peripheral artery disease) ([place]) 06/30/2014  . Loss of weight 06/18/2014  . Primary osteoarthritis involving multiple joints 05/17/2014   . Constipation 01/23/2014  . Anemia, iron deficiency 04/16/2012  . CAD (coronary artery disease) 04/06/2012  . Dementia of the Alzheimer's type, with late onset, with delusions ([place]) 12/28/2011  . GERD (gastroesophageal reflux disease) 08/07/2011    CBC    Component Value Date/Time   WBC 11.7* 12/17/2013 0351   RBC 2.96* 12/17/2013 0351   HGB 8.7* 12/17/2013 0351   HCT 26.9* 12/17/2013 0351   PLT 199 12/17/2013 0351   MCV 90.9 12/17/2013 0351   LYMPHSABS 2.0 12/17/2013 0351   MONOABS 0.8 12/17/2013 0351   EOSABS 0.1 12/17/2013 0351   BASOSABS 0.0 12/17/2013 0351    CMP     Component Value Date/Time   NA 146 12/17/2013 0351   K 3.9 12/17/2013 0351   CL 108 12/17/2013 0351   CO2 26 12/17/2013 0351  GLUCOSE 90 12/17/2013 0351   BUN 21 12/17/2013 0351   CREATININE 0.67 12/17/2013 0351   CALCIUM 8.8 12/17/2013 0351   PROT 5.8* 12/17/2013 0351   ALBUMIN 2.6* 12/17/2013 0351   AST 16 12/17/2013 0351   ALT 10 12/17/2013 0351   ALKPHOS 60 12/17/2013 0351   BILITOT 0.6 12/17/2013 0351   GFRNONAA 76* 12/17/2013 0351   GFRAA 88* 12/17/2013 0351    Assessment and Plan  Benign hypertensive heart disease without heart failure will continue norvasc 10 mg daily; lisinopril 10 mg daily; lopressor 25 mg twice daily; will continue to monitor  Dementia of the Alzheimer's type, with late onset, with delusions is presently without change in status; will continue aricept 5 mg daily and will monitor  CAD (coronary artery disease) : no complaint of chest pain present; will continue asa 325 mg daily, metoprolol 25 mg BID and will monitor      Hennie Duos, MD

## 2015-03-01 ENCOUNTER — Non-Acute Institutional Stay (SKILLED_NURSING_FACILITY): Payer: Medicare Other | Admitting: Adult Health

## 2015-03-01 DIAGNOSIS — K219 Gastro-esophageal reflux disease without esophagitis: Secondary | ICD-10-CM | POA: Diagnosis not present

## 2015-03-01 DIAGNOSIS — F0281 Dementia in other diseases classified elsewhere with behavioral disturbance: Secondary | ICD-10-CM

## 2015-03-01 DIAGNOSIS — D509 Iron deficiency anemia, unspecified: Secondary | ICD-10-CM | POA: Diagnosis not present

## 2015-03-01 DIAGNOSIS — I739 Peripheral vascular disease, unspecified: Secondary | ICD-10-CM

## 2015-03-01 DIAGNOSIS — I119 Hypertensive heart disease without heart failure: Secondary | ICD-10-CM

## 2015-03-01 DIAGNOSIS — M150 Primary generalized (osteo)arthritis: Secondary | ICD-10-CM | POA: Diagnosis not present

## 2015-03-01 DIAGNOSIS — G309 Alzheimer's disease, unspecified: Secondary | ICD-10-CM | POA: Diagnosis not present

## 2015-03-01 DIAGNOSIS — F22 Delusional disorders: Secondary | ICD-10-CM | POA: Diagnosis not present

## 2015-03-01 DIAGNOSIS — M159 Polyosteoarthritis, unspecified: Secondary | ICD-10-CM

## 2015-03-01 DIAGNOSIS — G301 Alzheimer's disease with late onset: Secondary | ICD-10-CM

## 2015-03-29 ENCOUNTER — Encounter: Payer: Self-pay | Admitting: Adult Health

## 2015-03-29 ENCOUNTER — Non-Acute Institutional Stay (SKILLED_NURSING_FACILITY): Payer: Medicare Other | Admitting: Adult Health

## 2015-03-29 DIAGNOSIS — D509 Iron deficiency anemia, unspecified: Secondary | ICD-10-CM | POA: Diagnosis not present

## 2015-03-29 DIAGNOSIS — R634 Abnormal weight loss: Secondary | ICD-10-CM | POA: Diagnosis not present

## 2015-03-29 DIAGNOSIS — K219 Gastro-esophageal reflux disease without esophagitis: Secondary | ICD-10-CM | POA: Diagnosis not present

## 2015-03-29 DIAGNOSIS — F0281 Dementia in other diseases classified elsewhere with behavioral disturbance: Secondary | ICD-10-CM | POA: Diagnosis not present

## 2015-03-29 DIAGNOSIS — M150 Primary generalized (osteo)arthritis: Secondary | ICD-10-CM | POA: Diagnosis not present

## 2015-03-29 DIAGNOSIS — G309 Alzheimer's disease, unspecified: Secondary | ICD-10-CM

## 2015-03-29 DIAGNOSIS — I739 Peripheral vascular disease, unspecified: Secondary | ICD-10-CM | POA: Diagnosis not present

## 2015-03-29 DIAGNOSIS — I119 Hypertensive heart disease without heart failure: Secondary | ICD-10-CM

## 2015-03-29 DIAGNOSIS — F22 Delusional disorders: Secondary | ICD-10-CM | POA: Diagnosis not present

## 2015-03-29 DIAGNOSIS — G301 Alzheimer's disease with late onset: Secondary | ICD-10-CM

## 2015-03-29 DIAGNOSIS — M159 Polyosteoarthritis, unspecified: Secondary | ICD-10-CM

## 2015-03-29 NOTE — Progress Notes (Signed)
Patient ID: [name], female   DOB: [birth year], 82 y.o.   MRN: [id number]   Facility:  [facility]       Allergies  Allergen Reactions  . Captopril-Hydrochlorothiazide Other (See Comments)    Reaction unknown  . Niacin And Related Other (See Comments)    Reaction unknown  . Urispas [Flavoxate] Other (See Comments)    Reaction unknown  . Verapamil Other (See Comments)    Reaction unknown    Chief Complaint  Patient presents with  . Medical Management of Chronic Issues    Follow up    HPI:  She is a long term resident of this facility being seen for the management of her chronic illnesses. Overall there is little change in her status. Her weight is stable at 132 pounds. She cannot fully participate in the hpi or ros. There are no nursing concerns at this time.   Past Medical History  Diagnosis Date  . Diverticulosis of colon (without mention of hemorrhage) 06/24/2006  . Vitamin B12 deficiency   . Alzheimer's disease   . HTN (hypertension)   . Depression   . PVD (peripheral vascular disease) ([place])   . CAD (coronary artery disease)   . GERD (gastroesophageal reflux disease)   . H. pylori infection 2004  . Presbyesophagus   . Iron deficiency anemia, unspecified 01/02/2012  . Personal history of fall 01/04/2009  . Unspecified constipation   . Hemorrhage of rectum and anus   . Chest pain, unspecified   . Acute pharyngitis   . Edema   . Cataract   . HLD (hyperlipidemia)   . Osteoporosis, unspecified   . Carcinoma of cecum ([place]) 1992    History of  . Malignant neoplasm of colon, unspecified site   . Fracture of femoral neck, right ([place]) 12/21/2013  . Dysphagia 08/07/2011  . Primary osteoarthritis involving multiple joints 05/17/2014    Past Surgical History  Procedure Laterality Date  . Gallbladder surgery    . Abdominal hysterectomy  1995    with bso  . Right colectomy    . Cataract extraction  2001  . Cholecystectomy Right   . Hip arthroplasty Right  12/15/2013    Procedure: HEMIARTHROPLASTY RIGHTHIP;  Surgeon: Newt Minion, MD;  Location: [facility];  Service: Orthopedics;  Laterality: Right;    VITAL SIGNS BP 162/93 mmHg  Pulse 74  Temp(Src) 97.1 F (36.2 C) (Oral)  Resp 17  Ht 4\' 10"  (1.473 m)  Wt 132 lb (59.875 kg)  BMI 27.60 kg/m2  SpO2 97%  Patient's Medications  New Prescriptions   No medications on file  Previous Medications   AMLODIPINE (NORVASC) 10 MG TABLET    Take 10 mg by mouth daily. For hypertension   ASPIRIN EC 325 MG TABLET    Take 1 tablet (325 mg total) by mouth daily.   CHOLECALCIFEROL (VITAMIN D) 2000 UNITS TABLET    Take 2,000 Units by mouth daily. For osteoporosis   DONEPEZIL (ARICEPT) 10 MG TABLET    Take 0.5 tablets (5 mg total) by mouth daily. For alzheimers disease   FERROUS SULFATE 325 (65 FE) MG TABLET    Take 325 mg by mouth daily with breakfast.   HYDROCODONE-ACETAMINOPHEN (NORCO) 5-325 MG PER TABLET    Take one tablet by mouth every 6 hours as needed for breakthrough pain. If unrelieved with scheduled Hydrocodone. Not to exceed 3000mg  of APAP from all sources/24hr   IPRATROPIUM-ALBUTEROL (DUONEB) 0.5-2.5 (3) MG/3ML SOLN    Take 3 mLs  by nebulization every 6 (six) hours as needed (shortness of breath.).   LACTASE (LACTAID PO)    Take 1 tablet by mouth every morning.   LISINOPRIL (PRINIVIL,ZESTRIL) 10 MG TABLET    Take 10 mg by mouth daily.   METOPROLOL (LOPRESSOR) 25 MG TABLET    Take 1 tablet (25 mg total) by mouth 2 (two) times daily. If pulse <50 or SBP <100 hold med and notify MD.   MIRTAZAPINE (REMERON) 15 MG TABLET    Take 15 mg by mouth at bedtime.   SENNOSIDES-DOCUSATE SODIUM (SENOKOT-S) 8.6-50 MG TABLET    Take 1 tablet by mouth daily.   SUCRALFATE (CARAFATE) 1 GM/10ML SUSPENSION    Take 1 g by mouth 4 (four) times daily -  before meals and at bedtime.   TRAZODONE (DESYREL) 50 MG TABLET    Take 50 mg by mouth at bedtime.   VITAMIN B-12 (CYANOCOBALAMIN) 1000 MCG TABLET    Take 1,000 mcg by mouth  every other day.  Modified Medications   No medications on file  Discontinued Medications   MIRTAZAPINE (REMERON) 7.5 MG TABLET    Take 15 mg by mouth at bedtime. Reported on 03/29/2015     SIGNIFICANT DIAGNOSTIC EXAMS  12-14-13: right hip x-ray: 1. Acute, impacted right femoral neck fracture. The distal fracture fragment is anteriorly displaced approximately 2 cm on the cross-table lateral view.  2. Decreased bony mineralization.  3. Cannot exclude an L3 compression fracture. No prior images of the lumbar spine available.  4. Prominent amount of stool in the rectum.  12-14-13: chest x-ray: Right basilar atelectasis.  01-27-14: chest x-ray: density right lung base suggestive of pneumonia possible associated effusion.   01-29-14: chest x-ray: right lower lobe plate like atelectasis in comparison no significant change. Atherosclerotic aorta. Moderate demineralization     03-13-14: chest x-ray: no acute cardiopulmonary disease process. Chronic interstial   05-13-14: right foot x-ray: no acute osseous abnormality  06-04-14; right ABI: moderate plaque right superficial femoral artery stenosis; infrapopliteal arterial occlusion. Right leg changes are in the severe ischemia range.      LABS REVIEWED:  03-13-14: wbc 12.3; hgb 9.6; hct 31.8; mcv 82.5 ;plt 229; glucose 81; bun 35.3;creat 0.75; k+4.0; na++146; liver norm albumin 3.4 03-17-14: right heel culture: staph aureus: septra ds  03-20-14: pre-albumin 12 05-13-14: wbc 10.8; hgb 10.7; hct 35.3; mcv 81.6; plt 290; glucose 153; bun 23.4; creat 0.68; k+4.3; na++145; liver normal albumin 3.8; pre-albumin 16  08-30-14: stool + c-diff; + guaiac 09-24-14: stool guaiac neg  12-10-14: guaiac for stool: neg; lactoferrin: +  12-24-14: wbc 6.5; hgb 10.3; hct 34.4; mcv 83.5; plt 201; glucose 68; bun 18.4; creat 0.62; k+ 4.1; na++145; liver normal albumin 3.5; vit B12: 889; iron 31; tibc 268; ferritin 32.38      Review of Systems Unable to perform ROS: Dementia      Physical Exam Constitutional: No distress.  Frail   Neck: Neck supple. No JVD present. No thyromegaly present.  Cardiovascular: Normal rate, regular rhythm and intact distal pulses.   Respiratory: Effort normal and breath sounds normal. No respiratory distress. She has no wheezes.  GI: Soft. Bowel sounds are normal. She exhibits no distension. There is no tenderness.  Musculoskeletal: She exhibits no edema.  Is able to move all extremities;  Neurological: She is alert.  Skin: Skin is warm and dry. She is not diaphoretic. Right heel ulceration: has resolved.        ASSESSMENT/ PLAN:  1.  severe  PAD:  Will continue vicodin 5/325 mg  every 6 hours as needed;  2. Hypertension: will continue norvasc 10 mg daily; lisinopril 10 mg daily; lopressor 25 mg twice daily; will continue to monitor  3. Dementia: is presently without change in status; will continue aricept 5 mg daily and will monitor  4. CAD: no complaint of chest pain present; will continue asa 325 mg daily and will monitor   5. GERD: will lower carafate to 1 gm twice daily and will monitor    6. Anemia: will continue iron daily; hgb is 10.3  7. Constipation: will continue senna s nightly   8. Weight loss: her current weight is 132 pounds and is slowly gaining weight. Her heel ulceration has resolved;  will monitor               Ok Edwards NP [facility]  Contact [phone] Monday through Friday 8am- 5pm  After hours call [phone]

## 2015-03-29 NOTE — Progress Notes (Signed)
Patient ID: [name], female   DOB: 05/17/1922, 82 y.o.   MRN: [id number]    Facility:  [facility]       Allergies  Allergen Reactions  . Captopril-Hydrochlorothiazide Other (See Comments)    Reaction unknown  . Niacin And Related Other (See Comments)    Reaction unknown  . Urispas [Flavoxate] Other (See Comments)    Reaction unknown  . Verapamil Other (See Comments)    Reaction unknown    Chief Complaint  Patient presents with  . Medical Management of Chronic Issues    HPI:  She is a long term resident of this facility being seen for the management of her chronic illnesses. Overall her status is stable. Her current weight is 128 pounds which is stable. She is unable to fully participate in the hpi or ros; but tells me that she is feeling good. There are no nursing concerns at this time.   Past Medical History  Diagnosis Date  . Diverticulosis of colon (without mention of hemorrhage) 06/24/2006  . Vitamin B12 deficiency   . Alzheimer's disease   . HTN (hypertension)   . Depression   . PVD (peripheral vascular disease) ([place])   . CAD (coronary artery disease)   . GERD (gastroesophageal reflux disease)   . H. pylori infection 2004  . Presbyesophagus   . Iron deficiency anemia, unspecified 01/02/2012  . Personal history of fall 01/04/2009  . Unspecified constipation   . Hemorrhage of rectum and anus   . Chest pain, unspecified   . Acute pharyngitis   . Edema   . Cataract   . HLD (hyperlipidemia)   . Osteoporosis, unspecified   . Carcinoma of cecum ([employer]) 1992    History of  . Malignant neoplasm of colon, unspecified site   . Fracture of femoral neck, right ([place]) 12/21/2013  . Dysphagia 08/07/2011    Past Surgical History  Procedure Laterality Date  . Gallbladder surgery    . Abdominal hysterectomy  1995    with bso  . Right colectomy    . Cataract extraction  2001  . Cholecystectomy Right   . Hip arthroplasty Right 12/15/2013    Procedure:  HEMIARTHROPLASTY RIGHTHIP;  Surgeon: Newt Minion, MD;  Location: [facility];  Service: Orthopedics;  Laterality: Right;    VITAL SIGNS BP 137/69 mmHg  Pulse 78  Ht 4\' 10"  (1.473 m)  Wt 128 lb (58.06 kg)  BMI 26.76 kg/m2  Patient's Medications  New Prescriptions   No medications on file  Previous Medications   AMLODIPINE (NORVASC) 10 MG TABLET    Take 10 mg by mouth daily. For hypertension   ASPIRIN EC 325 MG TABLET    Take 1 tablet (325 mg total) by mouth daily.   CHOLECALCIFEROL (VITAMIN D) 2000 UNITS TABLET    Take 2,000 Units by mouth daily. For osteoporosis   DONEPEZIL (ARICEPT) 10 MG TABLET    Take 0.5 tablets (5 mg total) by mouth daily. For alzheimers disease   FERROUS SULFATE 325 (65 FE) MG TABLET    Take 325 mg by mouth daily with breakfast.   HYDROCODONE-ACETAMINOPHEN (NORCO) 5-325 MG PER TABLET    Take one tablet by mouth every 6 hours as needed for breakthrough pain. If unrelieved with scheduled Hydrocodone. Not to exceed 3000mg  of APAP from all sources/24hr   IPRATROPIUM-ALBUTEROL (DUONEB) 0.5-2.5 (3) MG/3ML SOLN    Take 3 mLs by nebulization every 6 (six) hours as needed (shortness of breath.).   LISINOPRIL (PRINIVIL,ZESTRIL) 10  MG TABLET    Take 10 mg by mouth daily.   METOPROLOL (LOPRESSOR) 25 MG TABLET    Take 1 tablet (25 mg total) by mouth 2 (two) times daily. If pulse <50 or SBP <100 hold med and notify MD.   MIRTAZAPINE (REMERON) 7.5 MG TABLET    Take 15 mg by mouth at bedtime.    SENNOSIDES-DOCUSATE SODIUM (SENOKOT-S) 8.6-50 MG TABLET    Take 1 tablet by mouth daily.   SUCRALFATE (CARAFATE) 1 GM/10ML SUSPENSION    Take 1 g by mouth 4 (four) times daily -  before meals and at bedtime.   VITAMIN B-12 (CYANOCOBALAMIN) 1000 MCG TABLET    Take 1,000 mcg by mouth every other day.  Modified Medications   No medications on file  Discontinued Medications   No medications on file     SIGNIFICANT DIAGNOSTIC EXAMS    12-14-13: right hip x-ray: 1. Acute, impacted right  femoral neck fracture. The distal fracture fragment is anteriorly displaced approximately 2 cm on the cross-table lateral view.  2. Decreased bony mineralization.  3. Cannot exclude an L3 compression fracture. No prior images of the lumbar spine available.  4. Prominent amount of stool in the rectum.  12-14-13: chest x-ray: Right basilar atelectasis.  01-27-14: chest x-ray: density right lung base suggestive of pneumonia possible associated effusion.   01-29-14: chest x-ray: right lower lobe plate like atelectasis in comparison no significant change. Atherosclerotic aorta. Moderate demineralization     03-13-14: chest x-ray: no acute cardiopulmonary disease process. Chronic interstial   05-13-14: right foot x-ray: no acute osseous abnormality  06-04-14; right ABI: moderate plaque right superficial femoral artery stenosis; infrapopliteal arterial occlusion. Right leg changes are in the severe ischemia range.      LABS REVIEWED:  03-13-14: wbc 12.3; hgb 9.6; hct 31.8; mcv 82.5 ;plt 229; glucose 81; bun 35.3;creat 0.75; k+4.0; na++146; liver norm albumin 3.4 03-17-14: right heel culture: staph aureus: septra ds  03-20-14: pre-albumin 12 05-13-14: wbc 10.8; hgb 10.7; hct 35.3; mcv 81.6; plt 290; glucose 153; bun 23.4; creat 0.68; k+4.3; na++145; liver normal albumin 3.8; pre-albumin 16  08-30-14: stool + c-diff; + guaiac 09-24-14: stool guaiac neg  12-10-14: guaiac for stool: neg; lactoferrin: +  12-24-14: wbc 6.5; hgb 10.3; hct 34.4; mcv 83.5; plt 201; glucose 68; bun 18.4; creat 0.62; k+ 4.1; na++145; liver normal albumin 3.5; vit B12: 889; iron 31; tibc 268; ferritin 32.38      Review of Systems Unable to perform ROS: Dementia    Physical Exam Constitutional: No distress.  Frail   Neck: Neck supple. No JVD present. No thyromegaly present.  Cardiovascular: Normal rate, regular rhythm and intact distal pulses.   Respiratory: Effort normal and breath sounds normal. No respiratory distress. She has  no wheezes.  GI: Soft. Bowel sounds are normal. She exhibits no distension. There is no tenderness.  Musculoskeletal: She exhibits no edema.  Is able to move all extremities;  Neurological: She is alert.  Skin: Skin is warm and dry. She is not diaphoretic. Right heel ulceration: has resolved.        ASSESSMENT/ PLAN:  1.  severe PAD:  Will continue vicodin 5/325 mg  every 6 hours as needed;  2. Hypertension: will continue norvasc 10 mg daily; lisinopril 10 mg daily; lopressor 25 mg twice daily; will continue to monitor  3. Dementia: is presently without change in status; will continue aricept 5 mg daily and will monitor  4. CAD: no complaint of chest  pain present; will continue asa 325 mg daily and will monitor   5. GERD: will continue carafate 1 gm four times daily;   6. Anemia: will continue iron daily; hgb is 10.3  7. Constipation: will continue senna s nightly   8. Weight loss: her current weight is 128 pounds and is slowly gaining weight. Her heel ulceration has resolved; will stop the prostat; will stop her supplements due to her weight gain; and will monitor               Ok Edwards NP [facility]  Contact [phone] Monday through Friday 8am- 5pm  After hours call [phone]

## 2015-04-27 ENCOUNTER — Non-Acute Institutional Stay (SKILLED_NURSING_FACILITY): Payer: Medicare Other | Admitting: Adult Health

## 2015-04-27 ENCOUNTER — Encounter: Payer: Self-pay | Admitting: Adult Health

## 2015-04-27 DIAGNOSIS — D509 Iron deficiency anemia, unspecified: Secondary | ICD-10-CM | POA: Diagnosis not present

## 2015-04-27 DIAGNOSIS — K219 Gastro-esophageal reflux disease without esophagitis: Secondary | ICD-10-CM

## 2015-04-27 DIAGNOSIS — M150 Primary generalized (osteo)arthritis: Secondary | ICD-10-CM

## 2015-04-27 DIAGNOSIS — F0281 Dementia in other diseases classified elsewhere with behavioral disturbance: Secondary | ICD-10-CM | POA: Diagnosis not present

## 2015-04-27 DIAGNOSIS — F22 Delusional disorders: Secondary | ICD-10-CM

## 2015-04-27 DIAGNOSIS — M159 Polyosteoarthritis, unspecified: Secondary | ICD-10-CM

## 2015-04-27 DIAGNOSIS — I119 Hypertensive heart disease without heart failure: Secondary | ICD-10-CM | POA: Diagnosis not present

## 2015-04-27 DIAGNOSIS — R634 Abnormal weight loss: Secondary | ICD-10-CM | POA: Diagnosis not present

## 2015-04-27 DIAGNOSIS — I25119 Atherosclerotic heart disease of native coronary artery with unspecified angina pectoris: Secondary | ICD-10-CM | POA: Diagnosis not present

## 2015-04-27 DIAGNOSIS — G309 Alzheimer's disease, unspecified: Secondary | ICD-10-CM | POA: Diagnosis not present

## 2015-04-27 DIAGNOSIS — I739 Peripheral vascular disease, unspecified: Secondary | ICD-10-CM

## 2015-04-27 DIAGNOSIS — G301 Alzheimer's disease with late onset: Secondary | ICD-10-CM

## 2015-04-27 DIAGNOSIS — F02818 Dementia in other diseases classified elsewhere, unspecified severity, with other behavioral disturbance: Secondary | ICD-10-CM

## 2015-04-27 MED ORDER — SUCRALFATE 1 GM/10ML PO SUSP: 1.0000 g | Freq: Two times a day (BID) | ORAL | Status: AC

## 2015-04-27 NOTE — Progress Notes (Signed)
Patient ID: [name], female   DOB: October 17, 1922, 82 y.o.   MRN: [id number]    Facility:  [facility]       Allergies  Allergen Reactions  . Captopril-Hydrochlorothiazide Other (See Comments)    Reaction unknown  . Niacin And Related Other (See Comments)    Reaction unknown  . Urispas [Flavoxate] Other (See Comments)    Reaction unknown  . Verapamil Other (See Comments)    Reaction unknown    Chief Complaint  Patient presents with  . Medical Management of Chronic Issues    HPI:  She is long term resident of this facility being seen for the management of her chronic illnesses. Overall there is little change in her status. Her weight is stable at 132 pounds. She is unable to fully participate in the hpi or ros; but did tell me that she is feeling good. There are no nursing concerns at this time.   Past Medical History  Diagnosis Date  . Diverticulosis of colon (without mention of hemorrhage) 06/24/2006  . Vitamin B12 deficiency   . Alzheimer's disease   . HTN (hypertension)   . Depression   . PVD (peripheral vascular disease) ([place])   . CAD (coronary artery disease)   . GERD (gastroesophageal reflux disease)   . H. pylori infection 2004  . Presbyesophagus   . Iron deficiency anemia, unspecified 01/02/2012  . Personal history of fall 01/04/2009  . Unspecified constipation   . Hemorrhage of rectum and anus   . Chest pain, unspecified   . Acute pharyngitis   . Edema   . Cataract   . HLD (hyperlipidemia)   . Osteoporosis, unspecified   . Carcinoma of cecum ([place]) 1992    History of  . Malignant neoplasm of colon, unspecified site   . Fracture of femoral neck, right ([place]) 12/21/2013  . Dysphagia 08/07/2011  . Primary osteoarthritis involving multiple joints 05/17/2014    Past Surgical History  Procedure Laterality Date  . Gallbladder surgery    . Abdominal hysterectomy  1995    with bso  . Right colectomy    . Cataract extraction  2001  . Cholecystectomy Right    . Hip arthroplasty Right 12/15/2013    Procedure: HEMIARTHROPLASTY RIGHTHIP;  Surgeon: Newt Minion, MD;  Location: [facility];  Service: Orthopedics;  Laterality: Right;    VITAL SIGNS BP 128/76 mmHg  Pulse 78  Temp(Src) 97.1 F (36.2 C)  Ht 4\' 10"  (1.473 m)  Wt 132 lb (59.875 kg)  BMI 27.60 kg/m2  Patient's Medications  New Prescriptions   No medications on file  Previous Medications   AMLODIPINE (NORVASC) 10 MG TABLET    Take 10 mg by mouth daily. For hypertension   ASPIRIN EC 325 MG TABLET    Take 1 tablet (325 mg total) by mouth daily.   CHOLECALCIFEROL (VITAMIN D) 2000 UNITS TABLET    Take 2,000 Units by mouth daily. For osteoporosis   DONEPEZIL (ARICEPT) 10 MG TABLET    Take 0.5 tablets (5 mg total) by mouth daily. For alzheimers disease   FERROUS SULFATE 325 (65 FE) MG TABLET    Take 325 mg by mouth daily with breakfast.   HYDROCODONE-ACETAMINOPHEN (NORCO) 5-325 MG PER TABLET    Take one tablet by mouth every 6 hours as needed for breakthrough pain. If unrelieved with scheduled Hydrocodone. Not to exceed 3000mg  of APAP from all sources/24hr   IPRATROPIUM-ALBUTEROL (DUONEB) 0.5-2.5 (3) MG/3ML SOLN    Take 3 mLs by  nebulization every 6 (six) hours as needed (shortness of breath.).   LACTASE (LACTAID PO)    Take 1 tablet by mouth every morning.   LISINOPRIL (PRINIVIL,ZESTRIL) 10 MG TABLET    Take 10 mg by mouth daily.   METOPROLOL (LOPRESSOR) 25 MG TABLET    Take 1 tablet (25 mg total) by mouth 2 (two) times daily. If pulse <50 or SBP <100 hold med and notify MD.   MIRTAZAPINE (REMERON) 15 MG TABLET    Take 15 mg by mouth at bedtime.   SENNOSIDES-DOCUSATE SODIUM (SENOKOT-S) 8.6-50 MG TABLET    Take 1 tablet by mouth daily.   SUCRALFATE (CARAFATE) 1 GM/10ML SUSPENSION    Take 10 mLs (1 g total) by mouth 2 (two) times daily.   TRAZODONE (DESYREL) 50 MG TABLET    Take 50 mg by mouth at bedtime.   VITAMIN B-12 (CYANOCOBALAMIN) 1000 MCG TABLET    Take 1,000 mcg by mouth every other day.   Modified Medications   No medications on file  Discontinued Medications   No medications on file     SIGNIFICANT DIAGNOSTIC EXAMS   12-14-13: right hip x-ray: 1. Acute, impacted right femoral neck fracture. The distal fracture fragment is anteriorly displaced approximately 2 cm on the cross-table lateral view.  2. Decreased bony mineralization.  3. Cannot exclude an L3 compression fracture. No prior images of the lumbar spine available.  4. Prominent amount of stool in the rectum.  12-14-13: chest x-ray: Right basilar atelectasis.  01-27-14: chest x-ray: density right lung base suggestive of pneumonia possible associated effusion.   01-29-14: chest x-ray: right lower lobe plate like atelectasis in comparison no significant change. Atherosclerotic aorta. Moderate demineralization     03-13-14: chest x-ray: no acute cardiopulmonary disease process. Chronic interstial   05-13-14: right foot x-ray: no acute osseous abnormality  06-04-14; right ABI: moderate plaque right superficial femoral artery stenosis; infrapopliteal arterial occlusion. Right leg changes are in the severe ischemia range.      LABS REVIEWED:   05-13-14: wbc 10.8; hgb 10.7; hct 35.3; mcv 81.6; plt 290; glucose 153; bun 23.4; creat 0.68; k+4.3; na++145; liver normal albumin 3.8; pre-albumin 16  08-30-14: stool + c-diff; + guaiac 09-24-14: stool guaiac neg  12-10-14: guaiac for stool: neg; lactoferrin: +  12-24-14: wbc 6.5; hgb 10.3; hct 34.4; mcv 83.5; plt 201; glucose 68; bun 18.4; creat 0.62; k+ 4.1; na++145; liver normal albumin 3.5; vit B12: 889; iron 31; tibc 268; ferritin 32.38      Review of Systems Unable to perform ROS: Dementia    Physical Exam Constitutional: No distress.  Frail   Neck: Neck supple. No JVD present. No thyromegaly present.  Cardiovascular: Normal rate, regular rhythm and intact distal pulses.   Respiratory: Effort normal and breath sounds normal. No respiratory distress. She has no wheezes.    GI: Soft. Bowel sounds are normal. She exhibits no distension. There is no tenderness.  Musculoskeletal: She exhibits no edema.  Is able to move all extremities;  Neurological: She is alert.  Skin: Skin is warm and dry. She is not diaphoretic.        ASSESSMENT/ PLAN:  1.  severe PAD:  Will continue vicodin 5/325 mg  every 6 hours as needed;  2. Hypertension: will continue norvasc 10 mg daily; lisinopril 10 mg daily; lopressor 25 mg twice daily; will continue to monitor  3. Dementia: is presently without change in status; will continue aricept 5 mg daily and will monitor  4. CAD:  no complaint of chest pain present; will continue asa 325 mg daily and will monitor   5. GERD: will continue carafate  1 gm twice daily and will monitor    6. Anemia: will continue iron daily; hgb is 10.3  7. Constipation: will continue senna s nightly   8. Weight loss: her current weight is 132 pounds and is slowly gaining weight. Her heel ulceration has resolved;  will monitor       Ok Edwards NP [facility]  Contact [phone] Monday through Friday 8am- 5pm  After hours call [phone]

## 2015-05-24 DIAGNOSIS — I2510 Atherosclerotic heart disease of native coronary artery without angina pectoris: Secondary | ICD-10-CM | POA: Diagnosis not present

## 2015-05-24 DIAGNOSIS — M79672 Pain in left foot: Secondary | ICD-10-CM | POA: Diagnosis not present

## 2015-05-24 DIAGNOSIS — M79671 Pain in right foot: Secondary | ICD-10-CM | POA: Diagnosis not present

## 2015-05-24 DIAGNOSIS — B351 Tinea unguium: Secondary | ICD-10-CM | POA: Diagnosis not present

## 2015-05-24 LAB — HM DIABETES FOOT EXAM

## 2015-05-30 ENCOUNTER — Non-Acute Institutional Stay (SKILLED_NURSING_FACILITY): Payer: Medicare Other | Admitting: Internal Medicine

## 2015-05-30 DIAGNOSIS — F22 Delusional disorders: Secondary | ICD-10-CM | POA: Diagnosis not present

## 2015-05-30 DIAGNOSIS — D509 Iron deficiency anemia, unspecified: Secondary | ICD-10-CM

## 2015-05-30 DIAGNOSIS — I119 Hypertensive heart disease without heart failure: Secondary | ICD-10-CM | POA: Diagnosis not present

## 2015-05-30 DIAGNOSIS — G301 Alzheimer's disease with late onset: Secondary | ICD-10-CM

## 2015-05-30 DIAGNOSIS — F0281 Dementia in other diseases classified elsewhere with behavioral disturbance: Secondary | ICD-10-CM | POA: Diagnosis not present

## 2015-05-30 DIAGNOSIS — G309 Alzheimer's disease, unspecified: Secondary | ICD-10-CM

## 2015-06-04 ENCOUNTER — Encounter: Payer: Self-pay | Admitting: Internal Medicine

## 2015-06-04 NOTE — Assessment & Plan Note (Signed)
continue iron daily; hgb is 10.3

## 2015-06-04 NOTE — Progress Notes (Signed)
MRN: [id number] Name: [name]  Sex: female Age: 82 y.o. DOB: 02/09/1922  [place] #: Karren Burly Facility/Room: Level Of Care: SNF Provider: Inocencio Homes D Emergency Contacts: Extended Emergency Contact Information Primary Emergency Contact: [name] [place] Mobile Phone: [phone] Relation: Son Secondary Emergency Contact: [name],[name] Address: [address]          [place], [place] [address] [place] Phone: [phone] Relation: Other  Code Status:   Allergies: Captopril-hydrochlorothiazide; Niacin and related; Urispas; and Verapamil  Chief Complaint  Patient presents with  . Medical Management of Chronic Issues    HPI: Patient is 82 y.o. female who is being seen for routine issues of HTN, dementia and anemia.  Past Medical History  Diagnosis Date  . Diverticulosis of colon (without mention of hemorrhage) 06/24/2006  . Vitamin B12 deficiency   . Alzheimer's disease   . HTN (hypertension)   . Depression   . PVD (peripheral vascular disease) ([place])   . CAD (coronary artery disease)   . GERD (gastroesophageal reflux disease)   . H. pylori infection 2004  . Presbyesophagus   . Iron deficiency anemia, unspecified 01/02/2012  . Personal history of fall 01/04/2009  . Unspecified constipation   . Hemorrhage of rectum and anus   . Chest pain, unspecified   . Acute pharyngitis   . Edema   . Cataract   . HLD (hyperlipidemia)   . Osteoporosis, unspecified   . Carcinoma of cecum ([place]) 1992    History of  . Malignant neoplasm of colon, unspecified site   . Fracture of femoral neck, right ([place]) 12/21/2013  . Dysphagia 08/07/2011  . Primary osteoarthritis involving multiple joints 05/17/2014    Past Surgical History  Procedure Laterality Date  . Gallbladder surgery    . Abdominal hysterectomy  1995    with bso  . Right colectomy    . Cataract extraction  2001  . Cholecystectomy Right   . Hip arthroplasty Right 12/15/2013     Procedure: HEMIARTHROPLASTY RIGHTHIP;  Surgeon: Newt Minion, MD;  Location: [facility];  Service: Orthopedics;  Laterality: Right;      Medication List       This list is accurate as of: 05/30/15 11:59 PM.  Always use your most recent med list.               amLODipine 10 MG tablet  Commonly known as:  NORVASC  Take 10 mg by mouth daily. For hypertension     aspirin EC 325 MG tablet  Take 1 tablet (325 mg total) by mouth daily.     donepezil 10 MG tablet  Commonly known as:  ARICEPT  Take 0.5 tablets (5 mg total) by mouth daily. For alzheimers disease     ferrous sulfate 325 (65 FE) MG tablet  Take 325 mg by mouth daily with breakfast.     HYDROcodone-acetaminophen 5-325 MG tablet  Commonly known as:  NORCO  Take one tablet by mouth every 6 hours as needed for breakthrough pain. If unrelieved with scheduled Hydrocodone. Not to exceed 3000mg  of APAP from all sources/24hr     ipratropium-albuterol 0.5-2.5 (3) MG/3ML Soln  Commonly known as:  DUONEB  Take 3 mLs by nebulization every 6 (six) hours as needed (shortness of breath.).     LACTAID PO  Take 1 tablet by mouth every morning.     lisinopril 10 MG tablet  Commonly known as:  PRINIVIL,ZESTRIL  Take 10 mg by mouth daily.  metoprolol tartrate 25 MG tablet  Commonly known as:  LOPRESSOR  Take 1 tablet (25 mg total) by mouth 2 (two) times daily. If pulse <50 or SBP <100 hold med and notify MD.     mirtazapine 15 MG tablet  Commonly known as:  REMERON  Take 15 mg by mouth at bedtime.     sennosides-docusate sodium 8.6-50 MG tablet  Commonly known as:  SENOKOT-S  Take 1 tablet by mouth daily.     sucralfate 1 GM/10ML suspension  Commonly known as:  CARAFATE  Take 10 mLs (1 g total) by mouth 2 (two) times daily.     traZODone 50 MG tablet  Commonly known as:  DESYREL  Take 50 mg by mouth at bedtime.     vitamin B-12 1000 MCG tablet  Commonly known as:  CYANOCOBALAMIN  Take 1,000 mcg by mouth every other  day.     Vitamin D 2000 units tablet  Take 2,000 Units by mouth daily. For osteoporosis        No orders of the defined types were placed in this encounter.    Immunization History  Administered Date(s) Administered  . Influenza Whole 10/22/2004, 10/30/2012  . Influenza-Unspecified 11/02/2013, 10/29/2014  . PPD Test 08/03/2009  . Pneumococcal Polysaccharide-23 01/23/1999  . Pneumococcal-Unspecified 04/07/2012    Social History  Substance Use Topics  . Smoking status: Former Smoker    Types: Cigarettes  . Smokeless tobacco: Never Used  . Alcohol Use: No    Review of Systems  UT fuly participate 2/2 dementia    Filed Vitals:   06/04/15 2104  BP: 138/80  Pulse: 70  Temp: 97.3 F (36.3 C)  Resp: 18    Physical Exam  GENERAL APPEARANCE: Alert, conversant but i can't understand anything  she says but she is in no acute distress thin F SKIN: No diaphoresis rash HEENT: Unremarkable RESPIRATORY: Breathing is even, unlabored. Lung sounds are clear   CARDIOVASCULAR: Heart RRR no murmurs, rubs or gallops. No peripheral edema  GASTROINTESTINAL: Abdomen is soft, non-tender, not distended w/ normal bowel sounds.  GENITOURINARY: Bladder non tender, not distended  MUSCULOSKELETAL: No abnormal joints or musculature NEUROLOGIC: Cranial nerves 2-12 grossly intact. Moves all extremities PSYCHIATRIC: dementia, no behavioral issues  Patient Active Problem List   Diagnosis Date Noted  . Benign hypertensive heart disease without heart failure 12/02/2014  . PAD (peripheral artery disease) ([place]) 06/30/2014  . Loss of weight 06/18/2014  . Primary osteoarthritis involving multiple joints 05/17/2014  . Constipation 01/23/2014  . Anemia, iron deficiency 04/16/2012  . CAD (coronary artery disease) 04/06/2012  . Dementia of the Alzheimer's type, with late onset, with delusions ([place]) 12/28/2011  . GERD (gastroesophageal reflux disease) 08/07/2011    CBC    Component Value Date/Time    WBC 6.5 12/24/2014   WBC 11.7* 12/17/2013 0351   RBC 2.96* 12/17/2013 0351   HGB 10.3* 12/24/2014   HCT 34* 12/24/2014   PLT 201 12/24/2014   MCV 90.9 12/17/2013 0351   LYMPHSABS 2.0 12/17/2013 0351   MONOABS 0.8 12/17/2013 0351   EOSABS 0.1 12/17/2013 0351   BASOSABS 0.0 12/17/2013 0351    CMP     Component Value Date/Time   NA 145 12/24/2014   NA 146 12/17/2013 0351   K 4.1 12/24/2014   CL 108 12/17/2013 0351   CO2 26 12/17/2013 0351   GLUCOSE 90 12/17/2013 0351   BUN 18 12/24/2014   BUN 21 12/17/2013 0351   CREATININE 0.6 12/24/2014  CREATININE 0.67 12/17/2013 0351   CALCIUM 8.8 12/17/2013 0351   PROT 5.8* 12/17/2013 0351   ALBUMIN 2.6* 12/17/2013 0351   AST 16 12/17/2013 0351   ALT 10 12/17/2013 0351   ALKPHOS 60 12/17/2013 0351   BILITOT 0.6 12/17/2013 0351   GFRNONAA 76* 12/17/2013 0351   GFRAA 88* 12/17/2013 0351    Assessment and Plan  Benign hypertensive heart disease without heart failure Controlled on metoprolol 25 mg BID, lisinopril 10 mg and norvasc 10 mg , both daily  Dementia of the Alzheimer's type, with late onset, with delusions presently without change in status; will continue aricept 5 mg daily and will monitor  Anemia, iron deficiency continue iron daily; hgb is 10.3    Hennie Duos, MD

## 2015-06-04 NOTE — Assessment & Plan Note (Signed)
presently without change in status; will continue aricept 5 mg daily and will monitor

## 2015-06-04 NOTE — Assessment & Plan Note (Signed)
Controlled on metoprolol 25 mg BID, lisinopril 10 mg and norvasc 10 mg , both daily

## 2015-07-05 ENCOUNTER — Encounter: Payer: Self-pay | Admitting: Adult Health

## 2015-07-05 ENCOUNTER — Non-Acute Institutional Stay (SKILLED_NURSING_FACILITY): Payer: Medicare Other | Admitting: Adult Health

## 2015-07-05 DIAGNOSIS — F0281 Dementia in other diseases classified elsewhere with behavioral disturbance: Secondary | ICD-10-CM | POA: Diagnosis not present

## 2015-07-05 DIAGNOSIS — I119 Hypertensive heart disease without heart failure: Secondary | ICD-10-CM

## 2015-07-05 DIAGNOSIS — M150 Primary generalized (osteo)arthritis: Secondary | ICD-10-CM

## 2015-07-05 DIAGNOSIS — M159 Polyosteoarthritis, unspecified: Secondary | ICD-10-CM

## 2015-07-05 DIAGNOSIS — G301 Alzheimer's disease with late onset: Secondary | ICD-10-CM

## 2015-07-05 DIAGNOSIS — G309 Alzheimer's disease, unspecified: Secondary | ICD-10-CM

## 2015-07-05 DIAGNOSIS — D509 Iron deficiency anemia, unspecified: Secondary | ICD-10-CM

## 2015-07-05 DIAGNOSIS — R634 Abnormal weight loss: Secondary | ICD-10-CM | POA: Diagnosis not present

## 2015-07-05 DIAGNOSIS — F22 Delusional disorders: Secondary | ICD-10-CM

## 2015-07-05 NOTE — Progress Notes (Signed)
Patient ID: [name], female   DOB: May 19, 1922, 82 y.o.   MRN: [id number]   Location:  [facility] Room Number: 105-A Place of Service:  SNF (31)   CODE STATUS: DNR  Allergies  Allergen Reactions  . Captopril-Hydrochlorothiazide Other (See Comments)    Reaction unknown  . Niacin And Related Other (See Comments)    Reaction unknown  . Urispas [Flavoxate] Other (See Comments)    Reaction unknown  . Verapamil Other (See Comments)    Reaction unknown   Chief Complaint  Patient presents with  . Medical Management of Chronic Issues     HPI:  She is long term resident of this facility being seen for the management of her chronic illnesses. She is unable to participate in the hpi or ros; but did tell me that she is feeling good. There are no nursing concerns at this time.   Past Medical History  Diagnosis Date  . Diverticulosis of colon (without mention of hemorrhage) 06/24/2006  . Vitamin B12 deficiency   . Alzheimer's disease   . HTN (hypertension)   . Depression   . PVD (peripheral vascular disease) ([place])   . CAD (coronary artery disease)   . GERD (gastroesophageal reflux disease)   . H. pylori infection 2004  . Presbyesophagus   . Iron deficiency anemia, unspecified 01/02/2012  . Personal history of fall 01/04/2009  . Unspecified constipation   . Hemorrhage of rectum and anus   . Chest pain, unspecified   . Acute pharyngitis   . Edema   . Cataract   . HLD (hyperlipidemia)   . Osteoporosis, unspecified   . Carcinoma of cecum ([place]) 1992    History of  . Malignant neoplasm of colon, unspecified site   . Fracture of femoral neck, right ([place]) 12/21/2013  . Dysphagia 08/07/2011  . Primary osteoarthritis involving multiple joints 05/17/2014    Past Surgical History  Procedure Laterality Date  . Gallbladder surgery    . Abdominal hysterectomy  1995    with bso  . Right colectomy    . Cataract extraction  2001  . Cholecystectomy  Right   . Hip arthroplasty Right 12/15/2013    Procedure: HEMIARTHROPLASTY RIGHTHIP;  Surgeon: Newt Minion, MD;  Location: [facility];  Service: Orthopedics;  Laterality: Right;    Social History   Social History  . Marital Status: Widowed    Spouse Name: N/A  . Number of Children: 2  . Years of Education: N/A   Occupational History  . Retired    Social History Main Topics  . Smoking status: Former Smoker    Types: Cigarettes  . Smokeless tobacco: Never Used  . Alcohol Use: No  . Drug Use: No  . Sexual Activity: No   Other Topics Concern  . Not on file   Social History Narrative   Family History  Problem Relation Age of Onset  . Heart disease Mother   . Colon cancer Neg Hx       VITAL SIGNS BP 128/82 mmHg  Pulse 80  Temp(Src) 98.1 F (36.7 C) (Oral)  Resp 17  Ht 4\' 10"  (1.473 m)  Wt 132 lb (59.875 kg)  BMI 27.60 kg/m2  SpO2 94%  Patient's Medications  New Prescriptions   No medications on file  Previous Medications   AMLODIPINE (NORVASC) 10 MG TABLET    Take 10 mg by mouth daily. For hypertension   ASPIRIN EC 325 MG TABLET    Take  1 tablet (325 mg total) by mouth daily.   CHOLECALCIFEROL (VITAMIN D) 2000 UNITS TABLET    Take 2,000 Units by mouth daily. For osteoporosis   DONEPEZIL (ARICEPT) 10 MG TABLET    Take 0.5 tablets (5 mg total) by mouth daily. For alzheimers disease   FERROUS SULFATE 325 (65 FE) MG TABLET    Take 325 mg by mouth daily with breakfast.   HYDROCODONE-ACETAMINOPHEN (NORCO) 5-325 MG PER TABLET    Take one tablet by mouth every 6 hours as needed for breakthrough pain. If unrelieved with scheduled Hydrocodone. Not to exceed 3000mg  of APAP from all sources/24hr   IPRATROPIUM-ALBUTEROL (DUONEB) 0.5-2.5 (3) MG/3ML SOLN    Take 3 mLs by nebulization every 6 (six) hours as needed (shortness of breath.).   LACTASE (LACTAID PO)    Take 1 tablet by mouth every morning.   LISINOPRIL (PRINIVIL,ZESTRIL) 10 MG TABLET    Take 10 mg by mouth daily.    METOPROLOL (LOPRESSOR) 25 MG TABLET    Take 1 tablet (25 mg total) by mouth 2 (two) times daily. If pulse <50 or SBP <100 hold med and notify MD.   MIRTAZAPINE (REMERON) 15 MG TABLET    Take 15 mg by mouth at bedtime.   SENNOSIDES-DOCUSATE SODIUM (SENOKOT-S) 8.6-50 MG TABLET    Take 1 tablet by mouth daily.   SUCRALFATE (CARAFATE) 1 GM/10ML SUSPENSION    Take 10 mLs (1 g total) by mouth 2 (two) times daily.   TRAZODONE (DESYREL) 50 MG TABLET    Take 50 mg by mouth at bedtime.   VITAMIN B-12 (CYANOCOBALAMIN) 1000 MCG TABLET    Take 1,000 mcg by mouth every other day.  Modified Medications   No medications on file  Discontinued Medications   No medications on file     SIGNIFICANT DIAGNOSTIC EXAMS  12-14-13: right hip x-ray: 1. Acute, impacted right femoral neck fracture. The distal fracture fragment is anteriorly displaced approximately 2 cm on the cross-table lateral view.  2. Decreased bony mineralization.  3. Cannot exclude an L3 compression fracture. No prior images of the lumbar spine available.  4. Prominent amount of stool in the rectum.  12-14-13: chest x-ray: Right basilar atelectasis.  01-27-14: chest x-ray: density right lung base suggestive of pneumonia possible associated effusion.   01-29-14: chest x-ray: right lower lobe plate like atelectasis in comparison no significant change. Atherosclerotic aorta. Moderate demineralization     03-13-14: chest x-ray: no acute cardiopulmonary disease process. Chronic interstial   05-13-14: right foot x-ray: no acute osseous abnormality  06-04-14; right ABI: moderate plaque right superficial femoral artery stenosis; infrapopliteal arterial occlusion. Right leg changes are in the severe ischemia range.      LABS REVIEWED:   08-30-14: stool + c-diff; + guaiac 09-24-14: stool guaiac neg  12-10-14: guaiac for stool: neg; lactoferrin: +  12-24-14: wbc 6.5; hgb 10.3; hct 34.4; mcv 83.5; plt 201; glucose 68; bun 18.4; creat 0.62; k+ 4.1; na++145;  liver normal albumin 3.5; vit B12: 889; iron 31; tibc 268; ferritin 32.38      Review of Systems Unable to perform ROS: Dementia    Physical Exam Constitutional: No distress.  Frail   Neck: Neck supple. No JVD present. No thyromegaly present.  Cardiovascular: Normal rate, regular rhythm and intact distal pulses.   Respiratory: Effort normal and breath sounds normal. No respiratory distress. She has no wheezes.  GI: Soft. Bowel sounds are normal. She exhibits no distension. There is no tenderness.  Musculoskeletal: She exhibits  no edema.  Is able to move all extremities;  Neurological: She is alert.  Skin: Skin is warm and dry. She is not diaphoretic.        ASSESSMENT/ PLAN:  1.  severe PAD:  Will continue vicodin 5/325 mg  every 6 hours as needed;  2. Hypertension: will continue norvasc 10 mg daily; lisinopril 10 mg daily; lopressor 25 mg twice daily; will continue to monitor  3. Dementia: is presently without change in status; will continue aricept 5 mg daily and will monitor  4. CAD: no complaint of chest pain present; will continue asa 325 mg daily and will monitor   5. GERD: will continue carafate  1 gm twice daily and will monitor    6. Anemia: will continue iron daily; hgb is 10.3  7. Constipation: will continue senna s nightly   8. Weight loss: her current weight is 132 pounds and is slowly gaining weight. Her heel ulceration has resolved;  will monitor      Will check cbc; cmp        Ok Edwards NP [facility]  Contact [phone] Monday through Friday 8am- 5pm  After hours call [phone]

## 2015-07-06 DIAGNOSIS — Z79899 Other long term (current) drug therapy: Secondary | ICD-10-CM | POA: Diagnosis not present

## 2015-07-13 ENCOUNTER — Non-Acute Institutional Stay (SKILLED_NURSING_FACILITY): Payer: Medicare Other | Admitting: Adult Health

## 2015-07-13 ENCOUNTER — Encounter: Payer: Self-pay | Admitting: Adult Health

## 2015-07-13 DIAGNOSIS — R233 Spontaneous ecchymoses: Secondary | ICD-10-CM | POA: Diagnosis not present

## 2015-07-13 LAB — CBC WITH DIFFERENTIAL
BASO%: 1 %
BASOS ABS: 0 /uL
EOS%: 3 %
Eosinophil #: 0.2
HEMATOCRIT: 34 %
HEMOGLOBIN: 10.3
LYMPH#: 2.3
LYMPH%: 35 %
MCH: 25
MCHC: 29.9
MCV: 83.5
MONO#: 0.4
MONO%: 6 %
MPV: 7.7 fL (ref 7.5–11.5)
NEUT#: 3.7
NEUT%: 56 %
Platelets: 201
RDW: 15.6
WBC: 6.5

## 2015-07-13 LAB — RBC
ABS Retic: 79
RBC: 4.12
Retic Count, Absolute: 1.91

## 2015-07-13 NOTE — Progress Notes (Signed)
Patient ID: [name], female   DOB: July 26, 1922, 82 y.o.   MRN: [id number]    Location:  [facility] [place] Room Number: 105-A Place of Service:  SNF (31)   CODE STATUS: DNR  Allergies  Allergen Reactions  . Captopril-Hydrochlorothiazide Other (See Comments)    Reaction unknown  . Niacin And Related Other (See Comments)    Reaction unknown  . Urispas [Flavoxate] Other (See Comments)    Reaction unknown  . Verapamil Other (See Comments)    Reaction unknown    Chief Complaint  Patient presents with  . Acute Visit    Rash on leg and foot    HPI:  I have been asked to see her regarding a rash on both feet. The rash has just started within the past 1-2 days. There is no inflammation present; no itching present. She is unable to participate in the hpi or ros. There are no reports of fever present.   Past Medical History  Diagnosis Date  . Diverticulosis of colon (without mention of hemorrhage) 06/24/2006  . Vitamin B12 deficiency   . Alzheimer's disease   . HTN (hypertension)   . Depression   . PVD (peripheral vascular disease) ([place])   . CAD (coronary artery disease)   . GERD (gastroesophageal reflux disease)   . H. pylori infection 2004  . Presbyesophagus   . Iron deficiency anemia, unspecified 01/02/2012  . Personal history of fall 01/04/2009  . Unspecified constipation   . Hemorrhage of rectum and anus   . Chest pain, unspecified   . Acute pharyngitis   . Edema   . Cataract   . HLD (hyperlipidemia)   . Osteoporosis, unspecified   . Carcinoma of cecum ([place]) 1992    History of  . Malignant neoplasm of colon, unspecified site   . Fracture of femoral neck, right ([place]) 12/21/2013  . Dysphagia 08/07/2011  . Primary osteoarthritis involving multiple joints 05/17/2014    Past Surgical History  Procedure Laterality Date  . Gallbladder surgery    . Abdominal hysterectomy  1995    with bso  . Right colectomy    . Cataract extraction   2001  . Cholecystectomy Right   . Hip arthroplasty Right 12/15/2013    Procedure: HEMIARTHROPLASTY RIGHTHIP;  Surgeon: Newt Minion, MD;  Location: [facility];  Service: Orthopedics;  Laterality: Right;    Social History   Social History  . Marital Status: Widowed    Spouse Name: N/A  . Number of Children: 2  . Years of Education: N/A   Occupational History  . Retired    Social History Main Topics  . Smoking status: Former Smoker    Types: Cigarettes  . Smokeless tobacco: Never Used  . Alcohol Use: No  . Drug Use: No  . Sexual Activity: No   Other Topics Concern  . Not on file   Social History Narrative   Family History  Problem Relation Age of Onset  . Heart disease Mother   . Colon cancer Neg Hx       VITAL SIGNS BP 105/51 mmHg  Pulse 90  Temp(Src) 98.1 F (36.7 C) (Oral)  Resp 17  Ht 4\' 10"  (1.473 m)  Wt 132 lb (59.875 kg)  BMI 27.60 kg/m2  SpO2 94%  Patient's Medications  New Prescriptions   No medications on file  Previous Medications   AMLODIPINE (NORVASC) 10 MG TABLET    Take 10 mg by mouth daily. For hypertension  ASPIRIN EC 325 MG TABLET    Take 1 tablet (325 mg total) by mouth daily.   CHOLECALCIFEROL (VITAMIN D) 2000 UNITS TABLET    Take 2,000 Units by mouth daily. For osteoporosis   DONEPEZIL (ARICEPT) 10 MG TABLET    Take 0.5 tablets (5 mg total) by mouth daily. For alzheimers disease   FERROUS SULFATE 325 (65 FE) MG TABLET    Take 325 mg by mouth daily with breakfast.   HYDROCODONE-ACETAMINOPHEN (NORCO) 5-325 MG PER TABLET    Take one tablet by mouth every 6 hours as needed for breakthrough pain. If unrelieved with scheduled Hydrocodone. Not to exceed 3000mg  of APAP from all sources/24hr   IPRATROPIUM-ALBUTEROL (DUONEB) 0.5-2.5 (3) MG/3ML SOLN    Take 3 mLs by nebulization every 6 (six) hours as needed (shortness of breath.).   LACTASE (LACTAID PO)    Take 1 tablet by mouth every morning.   LISINOPRIL (PRINIVIL,ZESTRIL) 10 MG TABLET    Take 10  mg by mouth daily.   METOPROLOL (LOPRESSOR) 25 MG TABLET    Take 1 tablet (25 mg total) by mouth 2 (two) times daily. If pulse <50 or SBP <100 hold med and notify MD.   MIRTAZAPINE (REMERON) 15 MG TABLET    Take 15 mg by mouth at bedtime.   SENNOSIDES-DOCUSATE SODIUM (SENOKOT-S) 8.6-50 MG TABLET    Take 1 tablet by mouth daily.   SUCRALFATE (CARAFATE) 1 GM/10ML SUSPENSION    Take 10 mLs (1 g total) by mouth 2 (two) times daily.   TRAZODONE (DESYREL) 50 MG TABLET    Take 50 mg by mouth at bedtime.   VITAMIN B-12 (CYANOCOBALAMIN) 1000 MCG TABLET    Take 1,000 mcg by mouth every other day.  Modified Medications   No medications on file  Discontinued Medications   No medications on file     SIGNIFICANT DIAGNOSTIC EXAMS  12-14-13: right hip x-ray: 1. Acute, impacted right femoral neck fracture. The distal fracture fragment is anteriorly displaced approximately 2 cm on the cross-table lateral view.  2. Decreased bony mineralization.  3. Cannot exclude an L3 compression fracture. No prior images of the lumbar spine available.  4. Prominent amount of stool in the rectum.  12-14-13: chest x-ray: Right basilar atelectasis.  01-27-14: chest x-ray: density right lung base suggestive of pneumonia possible associated effusion.   01-29-14: chest x-ray: right lower lobe plate like atelectasis in comparison no significant change. Atherosclerotic aorta. Moderate demineralization     03-13-14: chest x-ray: no acute cardiopulmonary disease process. Chronic interstial   05-13-14: right foot x-ray: no acute osseous abnormality  06-04-14; right ABI: moderate plaque right superficial femoral artery stenosis; infrapopliteal arterial occlusion. Right leg changes are in the severe ischemia range.      LABS REVIEWED:   05-13-14: wbc 10.8; hgb 10.7; hct 35.3; mcv 81.6; plt 290; glucose 153; bun 23.4; creat 0.68; k+4.3; na++145; liver normal albumin 3.8; pre-albumin 16  08-30-14: stool + c-diff; + guaiac 09-24-14: stool  guaiac neg  12-10-14: guaiac for stool: neg; lactoferrin: +  12-24-14: wbc 6.5; hgb 10.3; hct 34.4; mcv 83.5; plt 201; glucose 68; bun 18.4; creat 0.62; k+ 4.1; na++145; liver normal albumin 3.5; vit B12: 889; iron 31; tibc 268; ferritin 32.38      Review of Systems Unable to perform ROS: Dementia    Physical Exam Constitutional: No distress.  Frail   Neck: Neck supple. No JVD present. No thyromegaly present.  Cardiovascular: Normal rate, regular rhythm and intact distal pulses.  Respiratory: Effort normal and breath sounds normal. No respiratory distress. She has no wheezes.  GI: Soft. Bowel sounds are normal. She exhibits no distension. There is no tenderness.  Musculoskeletal: She exhibits no edema.  Is able to move all extremities;  Neurological: She is alert.  Skin: Skin is warm and dry. She is not diaphoretic.  Has petechia rash on both feet       ASSESSMENT/ PLAN:  1. Petechia: will check cbc; cmp; vit B12 folate will monitor and will treat as indicated.     Ok Edwards NP [facility]  Contact [phone] Monday through Friday 8am- 5pm  After hours call [phone]

## 2015-07-14 ENCOUNTER — Non-Acute Institutional Stay (SKILLED_NURSING_FACILITY): Payer: Medicare Other | Admitting: Internal Medicine

## 2015-07-14 ENCOUNTER — Other Ambulatory Visit: Payer: Self-pay

## 2015-07-14 ENCOUNTER — Emergency Department (HOSPITAL_COMMUNITY): Payer: Medicare Other

## 2015-07-14 ENCOUNTER — Encounter: Payer: Self-pay | Admitting: Internal Medicine

## 2015-07-14 ENCOUNTER — Inpatient Hospital Stay (HOSPITAL_COMMUNITY)
Admission: EM | Admit: 2015-07-14 | Discharge: 2015-07-23 | DRG: 871 | Disposition: E | Payer: Medicare Other | Attending: Internal Medicine | Admitting: Internal Medicine

## 2015-07-14 ENCOUNTER — Encounter (HOSPITAL_COMMUNITY): Payer: Self-pay

## 2015-07-14 DIAGNOSIS — Z781 Physical restraint status: Secondary | ICD-10-CM

## 2015-07-14 DIAGNOSIS — Z888 Allergy status to other drugs, medicaments and biological substances status: Secondary | ICD-10-CM

## 2015-07-14 DIAGNOSIS — I2510 Atherosclerotic heart disease of native coronary artery without angina pectoris: Secondary | ICD-10-CM | POA: Diagnosis present

## 2015-07-14 DIAGNOSIS — F0281 Dementia in other diseases classified elsewhere with behavioral disturbance: Secondary | ICD-10-CM | POA: Diagnosis not present

## 2015-07-14 DIAGNOSIS — I739 Peripheral vascular disease, unspecified: Secondary | ICD-10-CM | POA: Diagnosis present

## 2015-07-14 DIAGNOSIS — A419 Sepsis, unspecified organism: Secondary | ICD-10-CM | POA: Diagnosis not present

## 2015-07-14 DIAGNOSIS — D65 Disseminated intravascular coagulation [defibrination syndrome]: Secondary | ICD-10-CM | POA: Diagnosis not present

## 2015-07-14 DIAGNOSIS — I10 Essential (primary) hypertension: Secondary | ICD-10-CM | POA: Diagnosis not present

## 2015-07-14 DIAGNOSIS — Z515 Encounter for palliative care: Secondary | ICD-10-CM | POA: Diagnosis not present

## 2015-07-14 DIAGNOSIS — R0600 Dyspnea, unspecified: Secondary | ICD-10-CM | POA: Diagnosis not present

## 2015-07-14 DIAGNOSIS — D509 Iron deficiency anemia, unspecified: Secondary | ICD-10-CM | POA: Diagnosis not present

## 2015-07-14 DIAGNOSIS — Z9049 Acquired absence of other specified parts of digestive tract: Secondary | ICD-10-CM | POA: Diagnosis not present

## 2015-07-14 DIAGNOSIS — E860 Dehydration: Secondary | ICD-10-CM | POA: Diagnosis not present

## 2015-07-14 DIAGNOSIS — Z7982 Long term (current) use of aspirin: Secondary | ICD-10-CM

## 2015-07-14 DIAGNOSIS — Z96641 Presence of right artificial hip joint: Secondary | ICD-10-CM | POA: Diagnosis present

## 2015-07-14 DIAGNOSIS — F0280 Dementia in other diseases classified elsewhere without behavioral disturbance: Secondary | ICD-10-CM | POA: Diagnosis present

## 2015-07-14 DIAGNOSIS — A4151 Sepsis due to Escherichia coli [E. coli]: Secondary | ICD-10-CM | POA: Diagnosis not present

## 2015-07-14 DIAGNOSIS — R509 Fever, unspecified: Secondary | ICD-10-CM

## 2015-07-14 DIAGNOSIS — B9620 Unspecified Escherichia coli [E. coli] as the cause of diseases classified elsewhere: Secondary | ICD-10-CM | POA: Diagnosis present

## 2015-07-14 DIAGNOSIS — D649 Anemia, unspecified: Secondary | ICD-10-CM

## 2015-07-14 DIAGNOSIS — M4854XA Collapsed vertebra, not elsewhere classified, thoracic region, initial encounter for fracture: Secondary | ICD-10-CM | POA: Diagnosis not present

## 2015-07-14 DIAGNOSIS — G301 Alzheimer's disease with late onset: Secondary | ICD-10-CM | POA: Diagnosis not present

## 2015-07-14 DIAGNOSIS — G309 Alzheimer's disease, unspecified: Secondary | ICD-10-CM

## 2015-07-14 DIAGNOSIS — F02818 Dementia in other diseases classified elsewhere, unspecified severity, with other behavioral disturbance: Secondary | ICD-10-CM | POA: Diagnosis present

## 2015-07-14 DIAGNOSIS — R54 Age-related physical debility: Secondary | ICD-10-CM | POA: Diagnosis not present

## 2015-07-14 DIAGNOSIS — Z87891 Personal history of nicotine dependence: Secondary | ICD-10-CM

## 2015-07-14 DIAGNOSIS — R0902 Hypoxemia: Secondary | ICD-10-CM | POA: Diagnosis not present

## 2015-07-14 DIAGNOSIS — D692 Other nonthrombocytopenic purpura: Secondary | ICD-10-CM | POA: Diagnosis not present

## 2015-07-14 DIAGNOSIS — E876 Hypokalemia: Secondary | ICD-10-CM | POA: Diagnosis not present

## 2015-07-14 DIAGNOSIS — N390 Urinary tract infection, site not specified: Secondary | ICD-10-CM

## 2015-07-14 DIAGNOSIS — R0602 Shortness of breath: Secondary | ICD-10-CM | POA: Diagnosis not present

## 2015-07-14 DIAGNOSIS — D696 Thrombocytopenia, unspecified: Secondary | ICD-10-CM

## 2015-07-14 DIAGNOSIS — T458X5A Adverse effect of other primarily systemic and hematological agents, initial encounter: Secondary | ICD-10-CM | POA: Diagnosis not present

## 2015-07-14 DIAGNOSIS — M3119 Other thrombotic microangiopathy: Secondary | ICD-10-CM

## 2015-07-14 DIAGNOSIS — Z66 Do not resuscitate: Secondary | ICD-10-CM | POA: Diagnosis present

## 2015-07-14 DIAGNOSIS — R740 Nonspecific elevation of levels of transaminase and lactic acid dehydrogenase [LDH]: Secondary | ICD-10-CM | POA: Diagnosis not present

## 2015-07-14 DIAGNOSIS — Z79899 Other long term (current) drug therapy: Secondary | ICD-10-CM | POA: Diagnosis not present

## 2015-07-14 DIAGNOSIS — E538 Deficiency of other specified B group vitamins: Secondary | ICD-10-CM | POA: Diagnosis not present

## 2015-07-14 DIAGNOSIS — R21 Rash and other nonspecific skin eruption: Secondary | ICD-10-CM | POA: Diagnosis not present

## 2015-07-14 DIAGNOSIS — J9601 Acute respiratory failure with hypoxia: Secondary | ICD-10-CM | POA: Diagnosis not present

## 2015-07-14 DIAGNOSIS — K219 Gastro-esophageal reflux disease without esophagitis: Secondary | ICD-10-CM | POA: Diagnosis present

## 2015-07-14 DIAGNOSIS — G9341 Metabolic encephalopathy: Secondary | ICD-10-CM | POA: Diagnosis not present

## 2015-07-14 DIAGNOSIS — M311 Thrombotic microangiopathy: Secondary | ICD-10-CM | POA: Diagnosis not present

## 2015-07-14 DIAGNOSIS — E878 Other disorders of electrolyte and fluid balance, not elsewhere classified: Secondary | ICD-10-CM | POA: Diagnosis present

## 2015-07-14 DIAGNOSIS — E785 Hyperlipidemia, unspecified: Secondary | ICD-10-CM | POA: Diagnosis present

## 2015-07-14 DIAGNOSIS — E870 Hyperosmolality and hypernatremia: Secondary | ICD-10-CM | POA: Diagnosis not present

## 2015-07-14 DIAGNOSIS — D693 Immune thrombocytopenic purpura: Secondary | ICD-10-CM | POA: Diagnosis not present

## 2015-07-14 DIAGNOSIS — R1310 Dysphagia, unspecified: Secondary | ICD-10-CM | POA: Diagnosis not present

## 2015-07-14 DIAGNOSIS — Z85038 Personal history of other malignant neoplasm of large intestine: Secondary | ICD-10-CM | POA: Diagnosis not present

## 2015-07-14 DIAGNOSIS — R7401 Elevation of levels of liver transaminase levels: Secondary | ICD-10-CM

## 2015-07-14 DIAGNOSIS — F22 Delusional disorders: Secondary | ICD-10-CM

## 2015-07-14 LAB — URINALYSIS, ROUTINE W REFLEX MICROSCOPIC
BILIRUBIN URINE: NEGATIVE
Glucose, UA: NEGATIVE mg/dL
KETONES UR: NEGATIVE mg/dL
NITRITE: POSITIVE — AB
PH: 5.5 (ref 5.0–8.0)
Protein, ur: 30 mg/dL — AB
SPECIFIC GRAVITY, URINE: 1.023 (ref 1.005–1.030)

## 2015-07-14 LAB — DIC (DISSEMINATED INTRAVASCULAR COAGULATION) PANEL
APTT: 38 s — AB (ref 24–37)
D DIMER QUANT: 1.96 ug{FEU}/mL — AB (ref 0.00–0.50)
FIBRINOGEN: 498 mg/dL — AB (ref 204–475)
SMEAR REVIEW: NONE SEEN

## 2015-07-14 LAB — DIRECT ANTIGLOBULIN TEST (NOT AT ARMC)
DAT, IGG: NEGATIVE
DAT, complement: NEGATIVE

## 2015-07-14 LAB — SAVE SMEAR

## 2015-07-14 LAB — CBC WITH DIFFERENTIAL/PLATELET
BASOS PCT: 0 %
Basophils Absolute: 0 10*3/uL (ref 0.0–0.1)
EOS ABS: 0 10*3/uL (ref 0.0–0.7)
EOS PCT: 0 %
HCT: 20.4 % — ABNORMAL LOW (ref 36.0–46.0)
Hemoglobin: 6.8 g/dL — CL (ref 12.0–15.0)
LYMPHS ABS: 0.9 10*3/uL (ref 0.7–4.0)
Lymphocytes Relative: 6 %
MCH: 28.3 pg (ref 26.0–34.0)
MCHC: 33.3 g/dL (ref 30.0–36.0)
MCV: 85 fL (ref 78.0–100.0)
MONO ABS: 1.1 10*3/uL — AB (ref 0.1–1.0)
Monocytes Relative: 7 %
NEUTROS ABS: 13 10*3/uL — AB (ref 1.7–7.7)
Neutrophils Relative %: 87 %
RBC: 2.4 MIL/uL — ABNORMAL LOW (ref 3.87–5.11)
RDW: 14 % (ref 11.5–15.5)
WBC: 15 10*3/uL — ABNORMAL HIGH (ref 4.0–10.5)

## 2015-07-14 LAB — COMPREHENSIVE METABOLIC PANEL
ALBUMIN: 2.8 g/dL — AB (ref 3.5–5.0)
ALK PHOS: 80 U/L (ref 38–126)
ALT: 64 U/L — ABNORMAL HIGH (ref 14–54)
ANION GAP: 7 (ref 5–15)
AST: 110 U/L — ABNORMAL HIGH (ref 15–41)
BILIRUBIN TOTAL: 1.2 mg/dL (ref 0.3–1.2)
BUN: 34 mg/dL — ABNORMAL HIGH (ref 6–20)
CALCIUM: 8 mg/dL — AB (ref 8.9–10.3)
CO2: 22 mmol/L (ref 22–32)
Chloride: 118 mmol/L — ABNORMAL HIGH (ref 101–111)
Creatinine, Ser: 0.94 mg/dL (ref 0.44–1.00)
GFR calc non Af Amer: 52 mL/min — ABNORMAL LOW (ref >60)
GLUCOSE: 129 mg/dL — AB (ref 65–99)
POTASSIUM: 3 mmol/L — AB (ref 3.5–5.1)
SODIUM: 147 mmol/L — AB (ref 135–145)
TOTAL PROTEIN: 5.3 g/dL — AB (ref 6.5–8.1)

## 2015-07-14 LAB — TYPE AND SCREEN
ABO/RH(D): O NEG
ANTIBODY SCREEN: NEGATIVE

## 2015-07-14 LAB — APTT: aPTT: 34 seconds (ref 24–37)

## 2015-07-14 LAB — PROTIME-INR
INR: 1.41 (ref 0.00–1.49)
Prothrombin Time: 17.4 seconds — ABNORMAL HIGH (ref 11.6–15.2)

## 2015-07-14 LAB — URINE MICROSCOPIC-ADD ON

## 2015-07-14 LAB — I-STAT CG4 LACTIC ACID, ED
Lactic Acid, Venous: 1.22 mmol/L (ref 0.5–2.0)
Lactic Acid, Venous: 2.07 mmol/L (ref 0.5–2.0)

## 2015-07-14 LAB — PROCALCITONIN: Procalcitonin: 21.8 ng/mL

## 2015-07-14 LAB — FERRITIN: Ferritin: 98 ng/mL (ref 11–307)

## 2015-07-14 LAB — DIC (DISSEMINATED INTRAVASCULAR COAGULATION)PANEL
INR: 1.33 (ref 0.00–1.49)
Prothrombin Time: 16.6 seconds — ABNORMAL HIGH (ref 11.6–15.2)

## 2015-07-14 LAB — GLUCOSE, CAPILLARY: Glucose-Capillary: 104 mg/dL — ABNORMAL HIGH (ref 65–99)

## 2015-07-14 LAB — LACTATE DEHYDROGENASE: LDH: 326 U/L — AB (ref 98–192)

## 2015-07-14 LAB — LACTIC ACID, PLASMA
LACTIC ACID, VENOUS: 0.7 mmol/L (ref 0.5–2.0)
Lactic Acid, Venous: 1.7 mmol/L (ref 0.5–2.0)

## 2015-07-14 LAB — VITAMIN B12: Vitamin B-12: 687 pg/mL (ref 180–914)

## 2015-07-14 MED ORDER — SODIUM CHLORIDE 0.9 % IV SOLN
1000.0000 mL | INTRAVENOUS | Status: DC
  Administered 2015-07-14 (×2): 1000 mL via INTRAVENOUS

## 2015-07-14 MED ORDER — LORAZEPAM 2 MG/ML IJ SOLN
0.5000 mg | Freq: Once | INTRAMUSCULAR | Status: AC
  Administered 2015-07-14: 0.5 mg via INTRAVENOUS
  Filled 2015-07-14: qty 1

## 2015-07-14 MED ORDER — POTASSIUM CHLORIDE IN NACL 40-0.9 MEQ/L-% IV SOLN
INTRAVENOUS | Status: DC
  Administered 2015-07-14 – 2015-07-15 (×2): 75 mL/h via INTRAVENOUS
  Administered 2015-07-16: 50 mL/h via INTRAVENOUS
  Filled 2015-07-14 (×4): qty 1000

## 2015-07-14 MED ORDER — SODIUM CHLORIDE 0.9 % IV SOLN
INTRAVENOUS | Status: AC
  Administered 2015-07-14: 21:00:00 via INTRAVENOUS

## 2015-07-14 MED ORDER — CETYLPYRIDINIUM CHLORIDE 0.05 % MT LIQD
7.0000 mL | Freq: Two times a day (BID) | OROMUCOSAL | Status: DC
  Administered 2015-07-14 – 2015-07-17 (×6): 7 mL via OROMUCOSAL

## 2015-07-14 MED ORDER — TRAMADOL HCL 50 MG PO TABS
50.0000 mg | ORAL_TABLET | Freq: Once | ORAL | Status: DC
  Filled 2015-07-14: qty 1

## 2015-07-14 MED ORDER — PIPERACILLIN-TAZOBACTAM 3.375 G IVPB 30 MIN
3.3750 g | Freq: Once | INTRAVENOUS | Status: AC
  Administered 2015-07-14: 3.375 g via INTRAVENOUS
  Filled 2015-07-14: qty 50

## 2015-07-14 MED ORDER — ACETAMINOPHEN 650 MG RE SUPP
650.0000 mg | Freq: Four times a day (QID) | RECTAL | Status: DC | PRN
  Administered 2015-07-15 – 2015-07-16 (×3): 650 mg via RECTAL
  Filled 2015-07-14 (×3): qty 1

## 2015-07-14 MED ORDER — ONDANSETRON HCL 4 MG/2ML IJ SOLN: 4.0000 mg | Freq: Four times a day (QID) | INTRAMUSCULAR | Status: DC | PRN

## 2015-07-14 MED ORDER — SODIUM CHLORIDE 0.9 % IV BOLUS (SEPSIS)
250.0000 mL | Freq: Once | INTRAVENOUS | Status: AC
Start: 2015-07-14 — End: 2015-07-14
  Administered 2015-07-14: 250 mL via INTRAVENOUS

## 2015-07-14 MED ORDER — IPRATROPIUM-ALBUTEROL 0.5-2.5 (3) MG/3ML IN SOLN
3.0000 mL | Freq: Four times a day (QID) | RESPIRATORY_TRACT | Status: DC | PRN
  Administered 2015-07-15 – 2015-07-16 (×2): 3 mL via RESPIRATORY_TRACT
  Filled 2015-07-14 (×2): qty 3

## 2015-07-14 MED ORDER — METOPROLOL TARTRATE 25 MG PO TABS
25.0000 mg | ORAL_TABLET | Freq: Two times a day (BID) | ORAL | Status: DC
  Administered 2015-07-15: 25 mg via ORAL
  Filled 2015-07-14: qty 1

## 2015-07-14 MED ORDER — DEXTROSE 5 % IV SOLN
1.0000 g | INTRAVENOUS | Status: DC
  Administered 2015-07-15: 1 g via INTRAVENOUS
  Filled 2015-07-14 (×2): qty 10

## 2015-07-14 MED ORDER — VANCOMYCIN HCL IN DEXTROSE 750-5 MG/150ML-% IV SOLN
750.0000 mg | INTRAVENOUS | Status: DC
  Administered 2015-07-15 – 2015-07-16 (×2): 750 mg via INTRAVENOUS
  Filled 2015-07-14 (×2): qty 150

## 2015-07-14 MED ORDER — DEXTROSE 5 % IV SOLN
1.0000 g | INTRAVENOUS | Status: AC
  Administered 2015-07-14: 1 g via INTRAVENOUS
  Filled 2015-07-14: qty 10

## 2015-07-14 MED ORDER — LORAZEPAM 0.5 MG PO TABS
0.5000 mg | ORAL_TABLET | Freq: Once | ORAL | Status: DC
  Filled 2015-07-14: qty 1

## 2015-07-14 MED ORDER — VANCOMYCIN HCL IN DEXTROSE 750-5 MG/150ML-% IV SOLN
750.0000 mg | INTRAVENOUS | Status: DC
  Filled 2015-07-14: qty 150

## 2015-07-14 MED ORDER — ONDANSETRON HCL 4 MG PO TABS: 4.0000 mg | ORAL_TABLET | Freq: Four times a day (QID) | ORAL | Status: DC | PRN

## 2015-07-14 MED ORDER — ACETAMINOPHEN 325 MG PO TABS
650.0000 mg | ORAL_TABLET | Freq: Four times a day (QID) | ORAL | Status: DC | PRN
  Filled 2015-07-14: qty 2

## 2015-07-14 MED ORDER — POTASSIUM CHLORIDE 10 MEQ/100ML IV SOLN
10.0000 meq | Freq: Once | INTRAVENOUS | Status: AC
  Administered 2015-07-14: 10 meq via INTRAVENOUS
  Filled 2015-07-14: qty 100

## 2015-07-14 MED ORDER — VANCOMYCIN HCL IN DEXTROSE 1-5 GM/200ML-% IV SOLN
1000.0000 mg | Freq: Once | INTRAVENOUS | Status: AC
  Administered 2015-07-14: 1000 mg via INTRAVENOUS
  Filled 2015-07-14: qty 200

## 2015-07-14 MED ORDER — SODIUM CHLORIDE 0.9 % IV SOLN
1000.0000 mL | Freq: Once | INTRAVENOUS | Status: AC
  Administered 2015-07-14: 1000 mL via INTRAVENOUS

## 2015-07-14 MED ORDER — SENNOSIDES-DOCUSATE SODIUM 8.6-50 MG PO TABS
1.0000 | ORAL_TABLET | Freq: Every day | ORAL | Status: DC
  Administered 2015-07-15: 1 via ORAL
  Filled 2015-07-14 (×3): qty 1

## 2015-07-14 MED ORDER — VITAMIN B-12 1000 MCG PO TABS
1000.0000 ug | ORAL_TABLET | ORAL | Status: DC
  Filled 2015-07-14: qty 1

## 2015-07-14 MED ORDER — FERROUS SULFATE 325 (65 FE) MG PO TABS
325.0000 mg | ORAL_TABLET | Freq: Every day | ORAL | Status: DC
  Administered 2015-07-15: 325 mg via ORAL
  Filled 2015-07-14: qty 1

## 2015-07-14 NOTE — ED Notes (Signed)
FIRST ATTEMPT TO CALL REPORT TO [facility] 2H27C.

## 2015-07-14 NOTE — ED Notes (Signed)
DR. Roxanne Mins MADE AWARE OF PT'S INCREASED AGITATION.

## 2015-07-14 NOTE — ED Notes (Signed)
[place] (POA) CELL [phone]. [facility] ALSO GAVE TELEPHONE CONSENT TO TRANSFUSE BLOOD OR BLOOD PRODUCTS IF NEEDED AND CONSENT TO TRANSFER THE PT TO [place]. CONVERSATION WITNESSED BY Atlee Abide, RN.

## 2015-07-14 NOTE — ED Notes (Signed)
PT RECEIVED FROM [facility] VIA EMS FOR FEVER AND RASH. PER FACILITY, THE PT HAD A TEMP OF 101.7, A PETECHIAL RASH ALL OVER, AND BRUISING WITH BLACKENED CIRCULAR AREAS. NO NEW MEDICATIONS OR OTHER EVENTS TO EXPLAIN THE SYMPTOMS. FACILITY ALSO STATES INCREASED AMS FROM HER NORMAL MENTATION. O2 SAT ON R/A 92%, PT GIVEN O2 AT 2L VIA NASAL CANNULA, O2 SAT AT 94%.

## 2015-07-14 NOTE — Consult Note (Signed)
[facility]  [phone] [phone]     ID: [name] DOB: [birth year]  MR#: [id number]  [id number]  Patient Care Team: Hennie Duos, MD as PCP - General (Internal Medicine) Gerlene Fee, NP as Nurse Practitioner (Nurse Practitioner) [facility] PCP: Hennie Duos, MD OTHER MD:  CHIEF COMPLAINT: severe thrombocytopenia and anemia in setting of fever  CURRENT TREATMENT: antibiotics, supportive therapy   HISTORY OF PRESENT ILLNESS: I was called to evaluate this 82 y/o nursing home resident with fever, severe thrombocytopenia and anemia. The patient is unable to give a history and there is no family in the room. According to the SNF note yesterday the patient was afebrile and at baseline. Overnight the patient developed vomiting and fever to 101.7. Dr Sheppard Coil was asked to evaluate her and she noted in addition a petechial rash. The patient was referred to the ED where she was found to have a WBC count of 15.0, ANC 13.0, Hb 6.8 with MCV 85.0, and platelets 5K.   The most recent CBC I can locate is from December 2016. At that time Hb was 10.3, WBC 6.5 and platelets 201K.  Relevant history includes B-12 deficiency on current supplementation, and history of iron deficiency on oral supplementation.  Workup so far includes a CXR showing no aspiration, turbid urine with positive Nitrite and WBCs TNTC; cultures and multiple labs pending  REVIEW OF SYSTEMS: The patient is unable to give me a ROS.  PAST MEDICAL HISTORY: Past Medical History  Diagnosis Date  . Diverticulosis of colon (without mention of hemorrhage) 06/24/2006  . Vitamin B12 deficiency   . Alzheimer's disease   . HTN (hypertension)   . Depression   . PVD (peripheral vascular disease) ([place])   . CAD (coronary artery disease)   . GERD (gastroesophageal reflux disease)   . H. pylori infection 2004  . Presbyesophagus   . Iron  deficiency anemia, unspecified 01/02/2012  . Personal history of fall 01/04/2009  . Unspecified constipation   . Hemorrhage of rectum and anus   . Chest pain, unspecified   . Acute pharyngitis   . Edema   . Cataract   . HLD (hyperlipidemia)   . Osteoporosis, unspecified   . Carcinoma of cecum ([place]) 1992    History of  . Malignant neoplasm of colon, unspecified site   . Fracture of femoral neck, right ([place]) 12/21/2013  . Dysphagia 08/07/2011  . Primary osteoarthritis involving multiple joints 05/17/2014    PAST SURGICAL HISTORY: Past Surgical History  Procedure Laterality Date  . Gallbladder surgery    . Abdominal hysterectomy  1995    with bso  . Right colectomy    . Cataract extraction  2001  . Cholecystectomy Right   . Hip arthroplasty Right 12/15/2013    Procedure: HEMIARTHROPLASTY RIGHTHIP;  Surgeon: Newt Minion, MD;  Location: [facility];  Service: Orthopedics;  Laterality: Right;    FAMILY HISTORY Family History  Problem Relation Age of Onset  . Heart disease Mother   . Colon cancer Neg Hx     ADVANCED DIRECTIVES: out-of-facility DNR in place   HEALTH MAINTENANCE: Social History  Substance Use Topics  . Smoking status: Former Smoker    Types: Cigarettes  . Smokeless tobacco: Never Used  . Alcohol Use: No     Allergies  Allergen Reactions  . Captopril-Hydrochlorothiazide Other (See Comments)    Reaction unknown  . Niacin And Related Other (See Comments)  Reaction unknown  . Urispas [Flavoxate] Other (See Comments)    Reaction unknown  . Verapamil Other (See Comments)    Reaction unknown    Current Facility-Administered Medications  Medication Dose Route Frequency Provider Last Rate Last Dose  . 0.9 %  sodium chloride infusion  1,000 mL Intravenous Continuous Delora Fuel, MD 0000000 mL/hr at 07/03/2015 1458 1,000 mL at 06/28/2015 1458   Current Outpatient Prescriptions  Medication Sig Dispense Refill  . amLODipine (NORVASC) 10 MG tablet Take 10 mg by mouth  daily. For hypertension    . aspirin EC 325 MG tablet Take 1 tablet (325 mg total) by mouth daily. 30 tablet 0  . Cholecalciferol (VITAMIN D) 2000 UNITS tablet Take 2,000 Units by mouth daily. For osteoporosis    . donepezil (ARICEPT) 5 MG tablet Take 5 mg by mouth at bedtime.    . ferrous sulfate 325 (65 FE) MG tablet Take 325 mg by mouth daily with breakfast.    . HYDROcodone-acetaminophen (NORCO/VICODIN) 5-325 MG tablet Take 1 tablet by mouth every 6 (six) hours as needed for moderate pain.    Marland Kitchen ipratropium-albuterol (DUONEB) 0.5-2.5 (3) MG/3ML SOLN Take 3 mLs by nebulization every 6 (six) hours as needed (shortness of breath.).    . Lactase (LACTAID PO) Take 1 tablet by mouth every morning.    Marland Kitchen lisinopril (PRINIVIL,ZESTRIL) 10 MG tablet Take 10 mg by mouth daily.    . metoprolol (LOPRESSOR) 25 MG tablet Take 1 tablet (25 mg total) by mouth 2 (two) times daily. If pulse <50 or SBP <100 hold med and notify MD. 60 tablet 0  . mirtazapine (REMERON) 15 MG tablet Take 15 mg by mouth at bedtime.    . sennosides-docusate sodium (SENOKOT-S) 8.6-50 MG tablet Take 1 tablet by mouth daily.    . sucralfate (CARAFATE) 1 GM/10ML suspension Take 10 mLs (1 g total) by mouth 2 (two) times daily. 420 mL prn  . traZODone (DESYREL) 50 MG tablet Take 50 mg by mouth at bedtime.    . vitamin B-12 (CYANOCOBALAMIN) 1000 MCG tablet Take 1,000 mcg by mouth every other day.      OBJECTIVE: elderly White woman examined in bed ([facility]); Filed Vitals:   07/11/2015 1630 07/02/2015 1730  BP: 102/40 110/44  Pulse: 67 69  Temp:    Resp: 18 18     Body mass index is 24.14 kg/(m^2).      Ocular: Sclerae unicteric, EOMs intact Lymphatic: No cervical or supraclavicular adenopathy Lungs no rales or wheezes, auscultated anterolaterally Heart regular rate and rhythm Abd soft, nontender, positive bowel sounds Neuro: nonfocal, alert, non-verbal,  Breasts: deferred   LAB RESULTS:  CMP     Component Value Date/Time   NA  147* 07/10/2015 1319   NA 145 12/24/2014   K 3.0* 07/13/2015 1319   CL 118* 07/21/2015 1319   CO2 22 06/24/2015 1319   GLUCOSE 129* 07/13/2015 1319   BUN 34* 07/13/2015 1319   BUN 18 12/24/2014   CREATININE 0.94 07/16/2015 1319   CREATININE 0.6 12/24/2014   CALCIUM 8.0* 07/17/2015 1319   PROT 5.3* 06/23/2015 1319   ALBUMIN 2.8* 07/01/2015 1319   AST 110* 07/01/2015 1319   ALT 64* 07/07/2015 1319   ALKPHOS 80 07/20/2015 1319   BILITOT 1.2 07/16/2015 1319   GFRNONAA 52* 06/30/2015 1319   GFRAA >60 06/25/2015 1319    INo results found for: SPEP, UPEP  Lab Results  Component Value Date   WBC 15.0* 07/17/2015   NEUTROABS 13.0*  07/11/2015   HGB 6.8* 06/30/2015   HCT 20.4* 07/12/2015   MCV 85.0 06/25/2015   PLT <5* 07/02/2015    @LASTCHEMISTRY @  No results found for: LABCA2  No components found for: [id number]  No results for input(s): INR in the last 168 hours.  Urinalysis    Component Value Date/Time   COLORURINE AMBER* 07/15/2015 1531   APPEARANCEUR TURBID* 07/16/2015 1531   LABSPEC 1.023 07/07/2015 1531   PHURINE 5.5 06/30/2015 1531   GLUCOSEU NEGATIVE 07/03/2015 1531   HGBUR LARGE* 07/22/2015 1531   BILIRUBINUR NEGATIVE 07/19/2015 1531   KETONESUR NEGATIVE 07/06/2015 1531   PROTEINUR 30* 07/02/2015 1531   UROBILINOGEN 0.2 12/14/2013 1645   NITRITE POSITIVE* 06/25/2015 1531   LEUKOCYTESUR MODERATE* 07/13/2015 1531        STUDIES: Dg Chest 2 View  07/05/2015  CLINICAL DATA:  Fever and rash. Altered mental status. Mild hypoxia. EXAM: CHEST  2 VIEW COMPARISON:  12/14/2013 FINDINGS: Lungs are hypoinflated with linear scarring/ atelectasis right base. Subtle chronic opacification of the left costophrenic angle. No acute airspace consolidation or effusion. Cardiomediastinal silhouette is within normal. There is calcified plaque over the thoracic aorta. Stable mild compression fracture over the lower thoracic spine. IMPRESSION: Hypoinflation without acute  cardiopulmonary disease. Chronic bibasilar changes. Aortic atherosclerosis. Stable lower thoracic spine mild compression fracture. Electronically Signed   By: Marin Olp M.D.   On: 07/10/2015 13:49     ASSESSMENT: 82 y.o. SNF resident presenting with fever, severe thrombocytopenia and anemia   REVIEW OF BLOOD FILM: RBCs show no schistocytes; no obvious spherocytosis; no tailed poikylocytes, no nucleated RBCs; white cells are normally granulated polys and bands, no significant immaturity; no platelets seen  PLAN: The clinical picture strongly suggests DIC, likely from infection from a urinary source; however DIC panel and of course cultures are pending. The absence of schistocytes leads away from TTP. At this point, from a practical standpoint, my recommendation is to treat the underlying disease, presumably urosepsis, as you are doing.  I would transfuse platelets and obtain a repeat platelet count 1 hour post transfusion if possible. Would then transfuse PRBCs.  Am adding a reticulocyte count and LDH to labs already drawn, as well as serial CBCs..  Will follow with you to resolution.   Chauncey Cruel, MD   07/12/2015 6:54 PM Medical Oncology and Hematology [facility] [address] [place], [place] [address] Tel. [phone]    Fax. [phone]

## 2015-07-14 NOTE — ED Notes (Signed)
Patient transported to X-ray 

## 2015-07-14 NOTE — Progress Notes (Signed)
Pharmacy Antibiotic Note  [name] is a 82 y.o. female admitted on 07/08/2015 with possible urosepsis and DIC vs TTP.  Transferred from [facility] ED to [facility].  Pharmacy has been consulted for Vancomycin dosing, previously dosed Ceftriaxone.  Avoiding Zosyn with possible TTP.  Vancomycin 1 gm IV given at [facility] ED at 3:30pm today.  Plan:  Vancomycin 750 mg IV q24hrs - begin on 6/23 at 4pm.  Continue Ceftriaxone 1 gm IV q24hrs.  Follow renal function, culture data, progress.  Target Vanc troughs 15-20 mcg/ml.    Height: 5\' 2"  (157.5 cm) Weight: 134 lb 7.7 oz (61 kg) IBW/kg (Calculated) : 50.1  Temp (24hrs), Avg:99 F (37.2 C), Min:98.1 F (36.7 C), Max:99.7 F (37.6 C)   Recent Labs Lab 06/25/2015 1319 07/10/2015 1332 07/16/2015 1354 07/17/2015 1859  WBC  --   --  15.0*  --   CREATININE 0.94  --   --   --   LATICACIDVEN  --  1.22  --  2.07*    Estimated Creatinine Clearance: 34.2 mL/min (by C-G formula based on Cr of 0.94).    Allergies  Allergen Reactions  . Captopril-Hydrochlorothiazide Other (See Comments)    Reaction unknown  . Niacin And Related Other (See Comments)    Reaction unknown  . Urispas [Flavoxate] Other (See Comments)    Reaction unknown  . Verapamil Other (See Comments)    Reaction unknown    Antimicrobials this admission: Vanc 6/22 >> Ceftriaxone 6/22 >> Zosyn 6/22 x 1 dose at 1449  Dose adjustments this admission:  n/a  Microbiology results: 6/22 Blood x 2 - sent 6/22 Urine - sent  Claudie Fisherman Pager: [phone] 07/01/2015 8:43 PM

## 2015-07-14 NOTE — Progress Notes (Signed)
MRN: [id number] Name: [name]  Sex: female Age: 82 y.o. DOB: [birth year]  [place] #:  Facility/Room: [facility]/105-A Level Of Care: SNF Provider: Inocencio Homes MD Emergency Contacts: Extended Emergency Contact Information Primary Emergency Contact: [name] [place] of [place] Mobile Phone: [phone] Relation: Son Secondary Emergency Contact: [name],[name] Address: [address]          [place], [place] [address] [name] of [place] Phone: [phone] Relation: Other  Code Status:  Allergies: Captopril-hydrochlorothiazide; Niacin and related; Urispas; and Verapamil  Chief Complaint  Patient presents with  . Acute Visit    HPI: Patient is 82 y.o. female who nursing asked me to see  for hx vomiting last night and fever 101.7 and noted rash this am which has progressed in the past few hours.Dec responsiveness in pt normally awake and alert, but demented. Pt with no new exposures.Nursing noted with blood draw this am she bled profusely. Pt is on ASA only.  Past Medical History  Diagnosis Date  . Diverticulosis of colon (without mention of hemorrhage) 06/24/2006  . Vitamin B12 deficiency   . Alzheimer's disease   . HTN (hypertension)   . Depression   . PVD (peripheral vascular disease) ([place])   . CAD (coronary artery disease)   . GERD (gastroesophageal reflux disease)   . H. pylori infection 2004  . Presbyesophagus   . Iron deficiency anemia, unspecified 01/02/2012  . Personal history of fall 01/04/2009  . Unspecified constipation   . Hemorrhage of rectum and anus   . Chest pain, unspecified   . Acute pharyngitis   . Edema   . Cataract   . HLD (hyperlipidemia)   . Osteoporosis, unspecified   . Carcinoma of cecum ([place]) 1992    History of  . Malignant neoplasm of colon, unspecified site   . Fracture of femoral neck, right ([place]) 12/21/2013  . Dysphagia 08/07/2011  . Primary osteoarthritis involving multiple joints 05/17/2014    Past Surgical  History  Procedure Laterality Date  . Gallbladder surgery    . Abdominal hysterectomy  1995    with bso  . Right colectomy    . Cataract extraction  2001  . Cholecystectomy Right   . Hip arthroplasty Right 12/15/2013    Procedure: HEMIARTHROPLASTY RIGHTHIP;  Surgeon: Newt Minion, MD;  Location: [facility];  Service: Orthopedics;  Laterality: Right;      Medication List    Notice    This visit is during an admission. Changes to the med list made in this visit will be reflected in the After Visit Summary of the admission.     Current Facility-Administered Medications on File Prior to Visit  Medication Dose Route Frequency Provider Last Rate Last Dose  . 0.9 %  sodium chloride infusion  1,000 mL Intravenous Continuous Delora Fuel, MD 0000000 mL/hr at 07/01/2015 1458 1,000 mL at 07/17/2015 1458  . potassium chloride 10 mEq in 100 mL IVPB  10 mEq Intravenous Once Delora Fuel, MD      . vancomycin (VANCOCIN) IVPB 1000 mg/200 mL premix  1,000 mg Intravenous Once Delora Fuel, MD [id number] mL/hr at 06/28/2015 1530 1,000 mg at 07/06/2015 1530   Current Outpatient Prescriptions on File Prior to Visit  Medication Sig Dispense Refill  . amLODipine (NORVASC) 10 MG tablet Take 10 mg by mouth daily. For hypertension    . aspirin EC 325 MG tablet Take 1 tablet (325 mg total) by mouth daily. 30 tablet 0  . Cholecalciferol (VITAMIN D) 2000 UNITS  tablet Take 2,000 Units by mouth daily. For osteoporosis    . donepezil (ARICEPT) 10 MG tablet Take 0.5 tablets (5 mg total) by mouth daily. For alzheimers disease 90 tablet 11  . donepezil (ARICEPT) 5 MG tablet Take 5 mg by mouth at bedtime.    . ferrous sulfate 325 (65 FE) MG tablet Take 325 mg by mouth daily with breakfast.    . HYDROcodone-acetaminophen (NORCO/VICODIN) 5-325 MG tablet Take 1 tablet by mouth every 6 (six) hours as needed for moderate pain.    Marland Kitchen ipratropium-albuterol (DUONEB) 0.5-2.5 (3) MG/3ML SOLN Take 3 mLs by nebulization every 6 (six) hours as needed  (shortness of breath.).    . Lactase (LACTAID PO) Take 1 tablet by mouth every morning.    Marland Kitchen lisinopril (PRINIVIL,ZESTRIL) 10 MG tablet Take 10 mg by mouth daily.    . metoprolol (LOPRESSOR) 25 MG tablet Take 1 tablet (25 mg total) by mouth 2 (two) times daily. If pulse <50 or SBP <100 hold med and notify MD. 60 tablet 0  . mirtazapine (REMERON) 15 MG tablet Take 15 mg by mouth at bedtime.    . sennosides-docusate sodium (SENOKOT-S) 8.6-50 MG tablet Take 1 tablet by mouth daily.    . sucralfate (CARAFATE) 1 GM/10ML suspension Take 10 mLs (1 g total) by mouth 2 (two) times daily. 420 mL prn  . traZODone (DESYREL) 50 MG tablet Take 50 mg by mouth at bedtime.    . vitamin B-12 (CYANOCOBALAMIN) 1000 MCG tablet Take 1,000 mcg by mouth every other day.         No orders of the defined types were placed in this encounter.    Immunization History  Administered Date(s) Administered  . Influenza Whole 10/22/2004, 10/30/2012  . Influenza-Unspecified 11/02/2013, 10/29/2014  . PPD Test 08/03/2009  . Pneumococcal Polysaccharide-23 01/23/1999  . Pneumococcal-Unspecified 04/07/2012    Social History  Substance Use Topics  . Smoking status: Former Smoker    Types: Cigarettes  . Smokeless tobacco: Never Used  . Alcohol Use: No    Review of Systems  DATA OBTAINED: from nurse GENERAL:  + fevers, fatigue, appetite changes SKIN: No itching, + rash HEENT: No complaint RESPIRATORY: No cough, wheezing, SOB CARDIAC: No chest pain, palpitations, lower extremity edema  GI: No abdominal pain, No N/V/D or constipation, No heartburn or reflux  GU: No dysuria, frequency or urgency, or incontinence  MUSCULOSKELETAL: No unrelieved bone/joint pain NEUROLOGIC: No headache, dizziness  PSYCHIATRIC: No overt anxiety or sadness  Filed Vitals:   07/20/2015 1342  BP: 105/51  Pulse: 90  Temp: 98.1 F (36.7 C)  Resp: 17    Physical Exam  GENERAL APPEARANCE: arousable, then back tosleep  SKIN: petechial  rash BLE and across breasts, a few on face;odd perfectly circular bruising scattered on extremities,a few of which are raised blood filled blisters,one  on inner L thigh; palms and soles spared, none around nails or eyes HEENT: Unremarkable RESPIRATORY: Breathing is even, unlabored. Lung sounds are clear; O2 SAT 90% RA   CARDIOVASCULAR: Heart RRR no murmurs, rubs or gallops. No peripheral edema  GASTROINTESTINAL: Abdomen is soft, non-tender, not distended w/ normal bowel sounds.  GENITOURINARY: Bladder non tender, not distended  MUSCULOSKELETAL: No abnormal joints or musculature NEUROLOGIC: Cranial nerves 2-12 grossly intact. Moves all extremities PSYCHIATRIC: dec LOC, no behavioral issues  Patient Active Problem List   Diagnosis Date Noted  . Benign hypertensive heart disease without heart failure 12/02/2014  . PAD (peripheral artery disease) ([place]) 06/30/2014  .  Loss of weight 06/18/2014  . Primary osteoarthritis involving multiple joints 05/17/2014  . Constipation 01/23/2014  . Anemia, iron deficiency 04/16/2012  . CAD (coronary artery disease) 04/06/2012  . Dementia of the Alzheimer's type, with late onset, with delusions ([place]) 12/28/2011  . GERD (gastroesophageal reflux disease) 08/07/2011    CBC    Component Value Date/Time   WBC 15.0* 06/24/2015 1354   WBC 6.5 12/24/2014   RBC 2.40* 07/11/2015 1354   HGB 6.8* 07/16/2015 1354   HCT 20.4* 06/30/2015 1354   HCT 34 12/24/2014   PLT <5* 07/02/2015 1354   PLT 201 12/24/2014   MCV 85.0 06/30/2015 1354   MCV 83.5 12/24/2014   LYMPHSABS 0.9 06/24/2015 1354   LYMPHSABS 2.3 12/24/2014   MONOABS 1.1* 07/02/2015 1354   EOSABS 0.0 06/25/2015 1354   EOSABS 0.2 12/24/2014   BASOSABS 0.0 06/27/2015 1354   BASOSABS 0 12/24/2014    CMP     Component Value Date/Time   NA 147* 06/27/2015 1319   NA 145 12/24/2014   K 3.0* 06/27/2015 1319   CL 118* 07/16/2015 1319   CO2 22 07/09/2015 1319   GLUCOSE 129* 07/05/2015 1319   BUN 34*  07/03/2015 1319   BUN 18 12/24/2014   CREATININE 0.94 07/13/2015 1319   CREATININE 0.6 12/24/2014   CALCIUM 8.0* 07/20/2015 1319   PROT 5.3* 07/15/2015 1319   ALBUMIN 2.8* 07/02/2015 1319   AST 110* 07/13/2015 1319   ALT 64* 07/08/2015 1319   ALKPHOS 80 07/09/2015 1319   BILITOT 1.2 06/26/2015 1319   GFRNONAA 52* 07/11/2015 1319   GFRAA >60 07/17/2015 1319    Assessment and Plan  FEBRILE ILLNESS WITH MS CHANGES AND PETECHIAL AND BRUISING RASH- need to r/o TTP and infectious etiologies; sending pt to hospital   Time spent > 35 min;> 50% of time with patient was spent reviewing records, labs, tests and studies, counseling and developing plan of care  Inocencio Homes  MD

## 2015-07-14 NOTE — Progress Notes (Signed)
Pharmacy Antibiotic Note  [name] is a 82 y.o. female admitted on 07/02/2015 with possible urosepsis and DIC vs TTP.  Pt received vancomycin and zosyn x1 in ED however MD would like to avoid further zosyn with possible TTP.   Pharmacy has been consulted for Ceftriaxone dosing.   Plan: Ceftriaxone 1g IV q24h F/u cultures, clinical course   Height: 5\' 2"  (157.5 cm) Weight: 132 lb (59.875 kg) IBW/kg (Calculated) : 50.1  Temp (24hrs), Avg:98.9 F (37.2 C), Min:98.1 F (36.7 C), Max:99.7 F (37.6 C)   Recent Labs Lab 07/06/2015 1319 07/03/2015 1332 07/04/2015 1354 06/29/2015 1859  WBC  --   --  15.0*  --   CREATININE 0.94  --   --   --   LATICACIDVEN  --  1.22  --  2.07*    Estimated Creatinine Clearance: 31.5 mL/min (by C-G formula based on Cr of 0.94).    Allergies  Allergen Reactions  . Captopril-Hydrochlorothiazide Other (See Comments)    Reaction unknown  . Niacin And Related Other (See Comments)    Reaction unknown  . Urispas [Flavoxate] Other (See Comments)    Reaction unknown  . Verapamil Other (See Comments)    Reaction unknown    Antimicrobials this admission: 6/22 Vanc x1 6/22 Zosyn x2 6/22 Ceftriaxone >>  Dose adjustments this admission:  Microbiology results: 6/22 BCx: collected 6/22 UCx:  collected  Thank you for allowing pharmacy to be a part of this patient's care.  Ralene Bathe, PharmD, BCPS 06/25/2015, 8:16 PM  Pager: [phone]

## 2015-07-14 NOTE — ED Provider Notes (Signed)
CSN: [id number]     Arrival date & time 07/03/2015  1222 History   First MD Initiated Contact with Patient 06/30/2015 1331     Chief Complaint  Patient presents with  . Fever  . Rash    POSSIBLE ALLERGIC REACTION PER [facility]     (Consider location/radiation/quality/duration/timing/severity/associated sxs/prior Treatment) Patient is a 82 y.o. female presenting with fever and rash. The history is provided by the nursing home. The history is limited by the condition of the patient (Dementia, altered mental status).  Fever Associated symptoms: rash   Rash Associated symptoms: fever   She was transferred from a nursing home where she was noted to have fever of 101.7, rash, and altered mentation. Patient is not able to give any history.  Past Medical History  Diagnosis Date  . Diverticulosis of colon (without mention of hemorrhage) 06/24/2006  . Vitamin B12 deficiency   . Alzheimer's disease   . HTN (hypertension)   . Depression   . PVD (peripheral vascular disease) ([place])   . CAD (coronary artery disease)   . GERD (gastroesophageal reflux disease)   . H. pylori infection 2004  . Presbyesophagus   . Iron deficiency anemia, unspecified 01/02/2012  . Personal history of fall 01/04/2009  . Unspecified constipation   . Hemorrhage of rectum and anus   . Chest pain, unspecified   . Acute pharyngitis   . Edema   . Cataract   . HLD (hyperlipidemia)   . Osteoporosis, unspecified   . Carcinoma of cecum ([place]) 1992    History of  . Malignant neoplasm of colon, unspecified site   . Fracture of femoral neck, right ([place]) 12/21/2013  . Dysphagia 08/07/2011  . Primary osteoarthritis involving multiple joints 05/17/2014   Past Surgical History  Procedure Laterality Date  . Gallbladder surgery    . Abdominal hysterectomy  1995    with bso  . Right colectomy    . Cataract extraction  2001  . Cholecystectomy Right   . Hip arthroplasty Right 12/15/2013    Procedure: HEMIARTHROPLASTY  RIGHTHIP;  Surgeon: Newt Minion, MD;  Location: [facility];  Service: Orthopedics;  Laterality: Right;   Family History  Problem Relation Age of Onset  . Heart disease Mother   . Colon cancer Neg Hx    Social History  Substance Use Topics  . Smoking status: Former Smoker    Types: Cigarettes  . Smokeless tobacco: Never Used  . Alcohol Use: No   OB History    No data available     Review of Systems  Unable to perform ROS: Dementia  Constitutional: Positive for fever.  Skin: Positive for rash.      Allergies  Captopril-hydrochlorothiazide; Niacin and related; Urispas; and Verapamil  Home Medications   Prior to Admission medications   Medication Sig Start Date End Date Taking? Authorizing Provider  amLODipine (NORVASC) 10 MG tablet Take 10 mg by mouth daily. For hypertension    Historical Provider, MD  aspirin EC 325 MG tablet Take 1 tablet (325 mg total) by mouth daily. 12/15/13   Newt Minion, MD  Cholecalciferol (VITAMIN D) 2000 UNITS tablet Take 2,000 Units by mouth daily. For osteoporosis    Historical Provider, MD  donepezil (ARICEPT) 10 MG tablet Take 0.5 tablets (5 mg total) by mouth daily. For alzheimers disease 06/18/14   Gerlene Fee, NP  ferrous sulfate 325 (65 FE) MG tablet Take 325 mg by mouth daily with breakfast.    Historical Provider, MD  HYDROcodone-acetaminophen (NORCO) 5-325 MG per tablet Take one tablet by mouth every 6 hours as needed for breakthrough pain. If unrelieved with scheduled Hydrocodone. Not to exceed 3000mg  of APAP from all sources/24hr 10/05/14   Estill Dooms, MD  ipratropium-albuterol (DUONEB) 0.5-2.5 (3) MG/3ML SOLN Take 3 mLs by nebulization every 6 (six) hours as needed (shortness of breath.).    Historical Provider, MD  Lactase (LACTAID PO) Take 1 tablet by mouth every morning.    Historical Provider, MD  lisinopril (PRINIVIL,ZESTRIL) 10 MG tablet Take 10 mg by mouth daily.    Historical Provider, MD  metoprolol (LOPRESSOR) 25 MG tablet  Take 1 tablet (25 mg total) by mouth 2 (two) times daily. If pulse <50 or SBP <100 hold med and notify MD. 04/08/13   Gayland Curry, DO  mirtazapine (REMERON) 15 MG tablet Take 15 mg by mouth at bedtime.    Historical Provider, MD  sennosides-docusate sodium (SENOKOT-S) 8.6-50 MG tablet Take 1 tablet by mouth daily.    Historical Provider, MD  sucralfate (CARAFATE) 1 GM/10ML suspension Take 10 mLs (1 g total) by mouth 2 (two) times daily. 04/27/15   Gerlene Fee, NP  traZODone (DESYREL) 50 MG tablet Take 50 mg by mouth at bedtime.    Historical Provider, MD  vitamin B-12 (CYANOCOBALAMIN) 1000 MCG tablet Take 1,000 mcg by mouth every other day.    Historical Provider, MD   BP 116/44 mmHg  Pulse 91  Temp(Src) 99.7 F (37.6 C) (Rectal)  Resp 22  Ht 5\' 2"  (1.575 m)  Wt 132 lb (59.875 kg)  BMI 24.14 kg/m2  SpO2 94% Physical Exam  Nursing note and vitals reviewed.  82 year old female, resting comfortably and in no acute distress. Vital signs are significant for mild tachypnea. Oxygen saturation is 94%, which is normal. Head is normocephalic and atraumatic. PERRLA, EOMI. Oropharynx is clear. Neck is nontender and supple without adenopathy or JVD. Back is nontender and there is no CVA tenderness. Lungs are clear without rales, wheezes, or rhonchi. Chest is nontender. Heart has regular rate and rhythm without murmur. Abdomen is soft, flat, nontender without masses or hepatosplenomegaly and peristalsis is normoactive. Extremities have no cyanosis or edema, full range of motion is present. Skin is warm and dry. Petechial rashes noted on the legs and also on the chest. Some purpuric lesions are present on the thighs. No areas of skin breakdown identified. Neurologic: She is awake and oriented to person but not place or time, cranial nerves are intact, there are no motor or sensory deficits.  ED Course  Procedures (including critical care time) Labs Review Results for orders placed or performed  during the hospital encounter of 06/26/2015  Culture, blood (Routine x 2)  Result Value Ref Range   Specimen Description BLOOD LEFT FOREARM    Special Requests IN PEDIATRIC BOTTLE 2 ML    Culture PENDING    Report Status PENDING   Comprehensive metabolic panel  Result Value Ref Range   Sodium 147 (H) 135 - 145 mmol/L   Potassium 3.0 (L) 3.5 - 5.1 mmol/L   Chloride 118 (H) 101 - 111 mmol/L   CO2 22 22 - 32 mmol/L   Glucose, Bld 129 (H) 65 - 99 mg/dL   BUN 34 (H) 6 - 20 mg/dL   Creatinine, Ser 0.94 0.44 - 1.00 mg/dL   Calcium 8.0 (L) 8.9 - 10.3 mg/dL   Total Protein 5.3 (L) 6.5 - 8.1 g/dL   Albumin 2.8 (L) 3.5 -  5.0 g/dL   AST 110 (H) 15 - 41 U/L   ALT 64 (H) 14 - 54 U/L   Alkaline Phosphatase 80 38 - 126 U/L   Total Bilirubin 1.2 0.3 - 1.2 mg/dL   GFR calc non Af Amer 52 (L) >60 mL/min   GFR calc Af Amer >60 >60 mL/min   Anion gap 7 5 - 15  CBC with Differential/Platelet  Result Value Ref Range   WBC 15.0 (H) 4.0 - 10.5 K/uL   RBC 2.40 (L) 3.87 - 5.11 MIL/uL   Hemoglobin 6.8 (LL) 12.0 - 15.0 g/dL   HCT 20.4 (L) 36.0 - 46.0 %   MCV 85.0 78.0 - 100.0 fL   MCH 28.3 26.0 - 34.0 pg   MCHC 33.3 30.0 - 36.0 g/dL   RDW 14.0 11.5 - 15.5 %   Platelets <5 (LL) 150 - 400 K/uL   Neutrophils Relative % 87 %   Lymphocytes Relative 6 %   Monocytes Relative 7 %   Eosinophils Relative 0 %   Basophils Relative 0 %   Neutro Abs 13.0 (H) 1.7 - 7.7 K/uL   Lymphs Abs 0.9 0.7 - 4.0 K/uL   Monocytes Absolute 1.1 (H) 0.1 - 1.0 K/uL   Eosinophils Absolute 0.0 0.0 - 0.7 K/uL   Basophils Absolute 0.0 0.0 - 0.1 K/uL  I-Stat CG4 Lactic Acid, ED  Result Value Ref Range   Lactic Acid, Venous 1.22 0.5 - 2.0 mmol/L  Type and screen [place]  Result Value Ref Range   ABO/RH(D) O NEG    Antibody Screen PENDING    Sample Expiration 07/17/2015    Imaging Review Dg Chest 2 View  06/29/2015  CLINICAL DATA:  Fever and rash. Altered mental status. Mild hypoxia. EXAM: CHEST  2 VIEW  COMPARISON:  12/14/2013 FINDINGS: Lungs are hypoinflated with linear scarring/ atelectasis right base. Subtle chronic opacification of the left costophrenic angle. No acute airspace consolidation or effusion. Cardiomediastinal silhouette is within normal. There is calcified plaque over the thoracic aorta. Stable mild compression fracture over the lower thoracic spine. IMPRESSION: Hypoinflation without acute cardiopulmonary disease. Chronic bibasilar changes. Aortic atherosclerosis. Stable lower thoracic spine mild compression fracture. Electronically Signed   By: Marin Olp M.D.   On: 07/04/2015 13:49   I have personally reviewed and evaluated these images and lab results as part of my medical decision-making.  CRITICAL CARE Performed by: Delora Fuel Total critical care time: 60 minutes Critical care time was exclusive of separately billable procedures and treating other patients. Critical care was necessary to treat or prevent imminent or life-threatening deterioration. Critical care was time spent personally by me on the following activities: development of treatment plan with patient and/or surrogate as well as nursing, discussions with consultants, evaluation of patient's response to treatment, examination of patient, obtaining history from patient or surrogate, ordering and performing treatments and interventions, ordering and review of laboratory studies, ordering and review of radiographic studies, pulse oximetry and re-evaluation of patient's condition.  MDM   Final diagnoses:  TTP (thrombotic thrombocytopenic purpura) (HCC)  Hypokalemia  Hypernatremia  Elevated transaminase level    Petechial rash with fever and altered mentation. No obvious source of fever, and patient does not actually appear toxic. Review of old records shows she had a routine nursing home visit yesterday and status is DO NOT RESUSCITATE. She has essentially normal vital signs here show on sepsis protocol was not  initiated. Lactic acid is come back a 1.22. Fever and rash  and altered mentation certainly could represent infection and she started on antibiotics for fever of unknown source. Also consider possibility of thrombotic thrombocytopenic purpura. Renal function today suggests mild dehydration and no obvious acute kidney injury but this will need to be followed.  Hemoglobin has come back 6.8, platelets less than 5 consistent with TTP. Electrolytes show mild hypernatremia and hypokalemia but with normal anion gap. Creatinine is increased slightly over baseline but BUN is increased moderately-probably representing dehydration as well as possible early acute kidney injury. AST and ALT are mildly elevated. Case is discussed with Dr. Ree Kida [facility]. Patient will be admitted to [facility] for consideration for plasmapheresis.  Delora Fuel, MD [id number]

## 2015-07-14 NOTE — H&P (Signed)
[facility] History and Physical  [name] [id number] DOB: Mar 11, 1922 DOA: 06/24/2015  PCP: Hennie Duos, MD  Patient coming from: [facility] facility  Chief Complaint: Fever and rash  HPI: [name] is a 82 y.o. female with a medical history of dementia, CAD, hypertension, presented to the emergency department for fever and rash. Patient resides in nursing facility. She supposedly was becoming more altered compared to her baseline. No family or aide was available at time of admission.  ED Course: Found to have platelets of less than 5, hemoglobin of 6.8, white blood cell of 15. Patient given vancomycin and Zosyn in the emergency department. [facility] called for admission.  Review of Systems:  As per HPI otherwise 10 point review of systems negative.   Past Medical History  Diagnosis Date  . Diverticulosis of colon (without mention of hemorrhage) 06/24/2006  . Vitamin B12 deficiency   . Alzheimer's disease   . HTN (hypertension)   . Depression   . PVD (peripheral vascular disease) ([place])   . CAD (coronary artery disease)   . GERD (gastroesophageal reflux disease)   . H. pylori infection 2004  . Presbyesophagus   . Iron deficiency anemia, unspecified 01/02/2012  . Personal history of fall 01/04/2009  . Unspecified constipation   . Hemorrhage of rectum and anus   . Chest pain, unspecified   . Acute pharyngitis   . Edema   . Cataract   . HLD (hyperlipidemia)   . Osteoporosis, unspecified   . Carcinoma of cecum ([place]) 1992    History of  . Malignant neoplasm of colon, unspecified site   . Fracture of femoral neck, right ([place]) 12/21/2013  . Dysphagia 08/07/2011  . Primary osteoarthritis involving multiple joints 05/17/2014    Past Surgical History  Procedure Laterality Date  . Gallbladder surgery    . Abdominal hysterectomy  1995    with bso  . Right colectomy    . Cataract extraction  2001  . Cholecystectomy Right   . Hip arthroplasty Right  12/15/2013    Procedure: HEMIARTHROPLASTY RIGHTHIP;  Surgeon: Newt Minion, MD;  Location: [facility];  Service: Orthopedics;  Laterality: Right;    Social History:  reports that she has quit smoking. Her smoking use included Cigarettes. She has never used smokeless tobacco. She reports that she does not drink alcohol or use illicit drugs.   Allergies  Allergen Reactions  . Captopril-Hydrochlorothiazide Other (See Comments)    Reaction unknown  . Niacin And Related Other (See Comments)    Reaction unknown  . Urispas [Flavoxate] Other (See Comments)    Reaction unknown  . Verapamil Other (See Comments)    Reaction unknown    Family History  Problem Relation Age of Onset  . Heart disease Mother   . Colon cancer Neg Hx     Prior to Admission medications   Medication Sig Start Date End Date Taking? Authorizing Provider  amLODipine (NORVASC) 10 MG tablet Take 10 mg by mouth daily. For hypertension   Yes Historical Provider, MD  aspirin EC 325 MG tablet Take 1 tablet (325 mg total) by mouth daily. 12/15/13  Yes Newt Minion, MD  Cholecalciferol (VITAMIN D) 2000 UNITS tablet Take 2,000 Units by mouth daily. For osteoporosis   Yes Historical Provider, MD  donepezil (ARICEPT) 5 MG tablet Take 5 mg by mouth at bedtime.   Yes Historical Provider, MD  ferrous sulfate 325 (65 FE) MG tablet Take 325 mg by mouth daily with  breakfast.   Yes Historical Provider, MD  HYDROcodone-acetaminophen (NORCO/VICODIN) 5-325 MG tablet Take 1 tablet by mouth every 6 (six) hours as needed for moderate pain.   Yes Historical Provider, MD  ipratropium-albuterol (DUONEB) 0.5-2.5 (3) MG/3ML SOLN Take 3 mLs by nebulization every 6 (six) hours as needed (shortness of breath.).   Yes Historical Provider, MD  Lactase (LACTAID PO) Take 1 tablet by mouth every morning.   Yes Historical Provider, MD  lisinopril (PRINIVIL,ZESTRIL) 10 MG tablet Take 10 mg by mouth daily.   Yes Historical Provider, MD  metoprolol (LOPRESSOR) 25  MG tablet Take 1 tablet (25 mg total) by mouth 2 (two) times daily. If pulse <50 or SBP <100 hold med and notify MD. 04/08/13  Yes Tiffany L Reed, DO  mirtazapine (REMERON) 15 MG tablet Take 15 mg by mouth at bedtime.   Yes Historical Provider, MD  sennosides-docusate sodium (SENOKOT-S) 8.6-50 MG tablet Take 1 tablet by mouth daily.   Yes Historical Provider, MD  sucralfate (CARAFATE) 1 GM/10ML suspension Take 10 mLs (1 g total) by mouth 2 (two) times daily. 04/27/15  Yes Gerlene Fee, NP  traZODone (DESYREL) 50 MG tablet Take 50 mg by mouth at bedtime.   Yes Historical Provider, MD  vitamin B-12 (CYANOCOBALAMIN) 1000 MCG tablet Take 1,000 mcg by mouth every other day.   Yes Historical Provider, MD    Physical Exam: Filed Vitals:   07/13/2015 1630 07/01/2015 1730  BP: 102/40 110/44  Pulse: 67 69  Temp:    Resp: 18 18     General: Well developed, well nourished, NAD, appears stated age  HEENT: NCAT, PERRLA, EOMI, Anicteic Sclera, mucous membranes Dry.   Neck: Supple, no JVD, no masses  Cardiovascular: S1 S2 auscultated, no rubs, murmurs or gallops. Regular rate and rhythm.  Respiratory: Clear to auscultation bilaterally   Abdomen: Soft, nontender, nondistended, + bowel sounds  Extremities: warm dry without cyanosis clubbing or edema  Neuro: Unable to assess at this time  Skin: Petechial rash on entire body, purpuric lesions on thighs  Psych: Cannot assess at this time  Labs on Admission: I have personally reviewed following labs and imaging studies CBC:  Recent Labs Lab 07/17/2015 1354  WBC 15.0*  NEUTROABS 13.0*  HGB 6.8*  HCT 20.4*  MCV 85.0  PLT <5*   Basic Metabolic Panel:  Recent Labs Lab 07/04/2015 1319  NA 147*  K 3.0*  CL 118*  CO2 22  GLUCOSE 129*  BUN 34*  CREATININE 0.94  CALCIUM 8.0*   GFR: Estimated Creatinine Clearance: 31.5 mL/min (by C-G formula based on Cr of 0.94). Liver Function Tests:  Recent Labs Lab 07/17/2015 1319  AST 110*  ALT 64*   ALKPHOS 80  BILITOT 1.2  PROT 5.3*  ALBUMIN 2.8*   No results for input(s): LIPASE, AMYLASE in the last 168 hours. No results for input(s): AMMONIA in the last 168 hours. Coagulation Profile: No results for input(s): INR, PROTIME in the last 168 hours. Cardiac Enzymes: No results for input(s): CKTOTAL, CKMB, CKMBINDEX, TROPONINI in the last 168 hours. BNP (last 3 results) No results for input(s): PROBNP in the last 8760 hours. HbA1C: No results for input(s): HGBA1C in the last 72 hours. CBG: No results for input(s): GLUCAP in the last 168 hours. Lipid Profile: No results for input(s): CHOL, HDL, LDLCALC, TRIG, CHOLHDL, LDLDIRECT in the last 72 hours. Thyroid Function Tests: No results for input(s): TSH, T4TOTAL, FREET4, T3FREE, THYROIDAB in the last 72 hours. Anemia Panel: No  results for input(s): VITAMINB12, FOLATE, FERRITIN, TIBC, IRON, RETICCTPCT in the last 72 hours. Urine analysis:    Component Value Date/Time   COLORURINE AMBER* 06/28/2015 1531   APPEARANCEUR TURBID* 07/03/2015 1531   LABSPEC 1.023 06/25/2015 1531   PHURINE 5.5 06/27/2015 1531   GLUCOSEU NEGATIVE 07/19/2015 1531   HGBUR LARGE* 07/07/2015 1531   BILIRUBINUR NEGATIVE 07/08/2015 1531   KETONESUR NEGATIVE 06/27/2015 1531   PROTEINUR 30* 07/09/2015 1531   UROBILINOGEN 0.2 12/14/2013 1645   NITRITE POSITIVE* 07/17/2015 1531   LEUKOCYTESUR MODERATE* 06/30/2015 1531   Sepsis Labs: @LABRCNTIP (procalcitonin:4,lacticidven:4) ) Recent Results (from the past 240 hour(s))  Culture, blood (Routine x 2)     Status: None (Preliminary result)   Collection Time: 07/21/2015  1:54 PM  Result Value Ref Range Status   Specimen Description BLOOD LEFT FOREARM  Final   Special Requests IN PEDIATRIC BOTTLE 2 ML  Final   Culture PENDING  Incomplete   Report Status PENDING  Incomplete     Radiological Exams on Admission: Dg Chest 2 View  06/30/2015  CLINICAL DATA:  Fever and rash. Altered mental status. Mild hypoxia.  EXAM: CHEST  2 VIEW COMPARISON:  12/14/2013 FINDINGS: Lungs are hypoinflated with linear scarring/ atelectasis right base. Subtle chronic opacification of the left costophrenic angle. No acute airspace consolidation or effusion. Cardiomediastinal silhouette is within normal. There is calcified plaque over the thoracic aorta. Stable mild compression fracture over the lower thoracic spine. IMPRESSION: Hypoinflation without acute cardiopulmonary disease. Chronic bibasilar changes. Aortic atherosclerosis. Stable lower thoracic spine mild compression fracture. Electronically Signed   By: Marin Olp M.D.   On: 07/17/2015 13:49    EKG: Independently reviewed. Sinus rhythm, rate 94  Assessment/Plan  Sepsis secondary to UTI/Possible TTP -Upon admission, patient noted to have leukocytosis with mild tachycardia and tachypnea. -Chest x-ray: No acute cardiopulmonary disease -UA: TNTC WBC, moderate leukocytes, positive nitrites, many bacteria, large hemoglobin -Will start patient on ceftriaxone -Patient was given dose of vancomycin as well as Zosyn in the emergency department however given patient's possible TTP, Zosyn may worsen this -Will hold off on Zosyn given patient's age and possible creatinine clearance. If Antibiotics may need to be broadened, however, would be cautious. Current lactic acid is normal -Blood and urine cultures pending  Possible TTP -Patient does have petechiae on physical examination -Upon arrival, patient did have low-grade temperature of 99.7, however had fever of 101.7 at the nursing facility. -Platelets noted to be less than 5, hemoglobin currently 6.8 -Oncology consulted and appreciated -Patient may need plasmapheresis -Spoke with patient's son via phone, okay with plasmapheresis or blood transfusions if needed -DIC panel, ferritin, B-12, folate, [place], LDH ordered and pending -Order peripheral blood smear as well  Normocytic anemia -Hemoglobin currently 6.8, baseline  appears to be 9-10 -Possibly secondary to the above -Will not transfuse at this time, will wait for further recommendations from oncology -Continue to monitor CBC  Hypernatremia and hyperchloremia -Likely secondary to dehydration -Will place on IV fluids, continue to monitor BMP  Hypokalemia -Will replace and continue to monitor BMP  History of CAD and PAD -Holding aspirin secondary to thrombocytopenia and possible TTP  Dementia -Hold Aricept  Essential hypertension -Continue metoprolol with holding parameters -Hold amlodipine, lisinopril  Thoracic spine compression fracture -Stable on x-ray  DVT prophylaxis: None (platelets <5, petechiae on lower extremities)  Code Status: DNR  Family Communication: Spoke with son, Madora Tamaki via phone.  Disposition Plan: Admitted to stepdown, transfer to [facility]  Consults called: Oncology, Dr.  Magrinat  Admission status: Admitted   Time spent: 70 minutes  Shavaun Osterloh D.O. [facility] Pager [phone]  If 7PM-7AM, please contact night-coverage [email or web address] Password [facility] 06/30/2015, 5:55 PM

## 2015-07-15 DIAGNOSIS — A4151 Sepsis due to Escherichia coli [E. coli]: Secondary | ICD-10-CM

## 2015-07-15 LAB — CBC WITH DIFFERENTIAL/PLATELET
BASOS ABS: 0 10*3/uL (ref 0.0–0.1)
BASOS PCT: 0 %
Eosinophils Absolute: 0.2 10*3/uL (ref 0.0–0.7)
Eosinophils Relative: 2 %
HEMATOCRIT: 21.8 % — AB (ref 36.0–46.0)
HEMOGLOBIN: 6.5 g/dL — AB (ref 12.0–15.0)
LYMPHS PCT: 15 %
Lymphs Abs: 1.6 10*3/uL (ref 0.7–4.0)
MCH: 27.9 pg (ref 26.0–34.0)
MCHC: 31.2 g/dL (ref 30.0–36.0)
MCV: 89.3 fL (ref 78.0–100.0)
MONO ABS: 0.6 10*3/uL (ref 0.1–1.0)
Monocytes Relative: 5 %
NEUTROS ABS: 8.9 10*3/uL — AB (ref 1.7–7.7)
NEUTROS PCT: 79 %
Platelets: 24 10*3/uL — CL (ref 150–400)
RBC: 2.44 MIL/uL — AB (ref 3.87–5.11)
RDW: 14.5 % (ref 11.5–15.5)
WBC: 10.5 10*3/uL (ref 4.0–10.5)

## 2015-07-15 LAB — CBC
HCT: 18.6 % — ABNORMAL LOW (ref 36.0–46.0)
HEMATOCRIT: 30.1 % — AB (ref 36.0–46.0)
HEMOGLOBIN: 5.8 g/dL — AB (ref 12.0–15.0)
Hemoglobin: 9.8 g/dL — ABNORMAL LOW (ref 12.0–15.0)
MCH: 27.5 pg (ref 26.0–34.0)
MCH: 27.5 pg (ref 26.0–34.0)
MCHC: 31.2 g/dL (ref 30.0–36.0)
MCHC: 32.6 g/dL (ref 30.0–36.0)
MCV: 88.2 fL (ref 78.0–100.0)
MCV: 84.6 fL (ref 78.0–100.0)
RBC: 2.11 MIL/uL — AB (ref 3.87–5.11)
RBC: 3.56 MIL/uL — ABNORMAL LOW (ref 3.87–5.11)
RDW: 14.4 % (ref 11.5–15.5)
RDW: 14.6 % (ref 11.5–15.5)
WBC: 9.6 10*3/uL (ref 4.0–10.5)
WBC: 11.5 10*3/uL — AB (ref 4.0–10.5)

## 2015-07-15 LAB — LACTATE DEHYDROGENASE: LDH: 277 U/L — AB (ref 98–192)

## 2015-07-15 LAB — COMPREHENSIVE METABOLIC PANEL
ALK PHOS: 63 U/L (ref 38–126)
ALT: 49 U/L (ref 14–54)
ANION GAP: 2 — AB (ref 5–15)
AST: 59 U/L — ABNORMAL HIGH (ref 15–41)
Albumin: 2.2 g/dL — ABNORMAL LOW (ref 3.5–5.0)
BUN: 24 mg/dL — ABNORMAL HIGH (ref 6–20)
CALCIUM: 7.6 mg/dL — AB (ref 8.9–10.3)
CO2: 21 mmol/L — ABNORMAL LOW (ref 22–32)
CREATININE: 0.78 mg/dL (ref 0.44–1.00)
Chloride: 124 mmol/L — ABNORMAL HIGH (ref 101–111)
Glucose, Bld: 91 mg/dL (ref 65–99)
Potassium: 3.7 mmol/L (ref 3.5–5.1)
SODIUM: 147 mmol/L — AB (ref 135–145)
TOTAL PROTEIN: 4.6 g/dL — AB (ref 6.5–8.1)
Total Bilirubin: 0.9 mg/dL (ref 0.3–1.2)

## 2015-07-15 LAB — FOLATE RBC
FOLATE, HEMOLYSATE: 325.6 ng/mL
Folate, RBC: 1362 ng/mL (ref >498)
HEMATOCRIT: 23.9 % — AB (ref 34.0–46.6)

## 2015-07-15 LAB — RETICULOCYTES
RBC.: 2.44 MIL/uL — AB (ref 3.87–5.11)
RETIC COUNT ABSOLUTE: 65.9 10*3/uL (ref 19.0–186.0)
Retic Ct Pct: 2.7 % (ref 0.4–3.1)

## 2015-07-15 LAB — ABO/RH: ABO/RH(D): O NEG

## 2015-07-15 LAB — MRSA PCR SCREENING: MRSA BY PCR: NEGATIVE

## 2015-07-15 LAB — PREPARE RBC (CROSSMATCH)

## 2015-07-15 MED ORDER — LORAZEPAM 2 MG/ML IJ SOLN
0.5000 mg | Freq: Four times a day (QID) | INTRAMUSCULAR | Status: DC | PRN
  Administered 2015-07-15 – 2015-07-16 (×3): 0.5 mg via INTRAVENOUS
  Filled 2015-07-15 (×4): qty 1

## 2015-07-15 MED ORDER — SODIUM CHLORIDE 0.9 % IV SOLN
Freq: Once | INTRAVENOUS | Status: AC
  Administered 2015-07-16: 01:00:00 via INTRAVENOUS

## 2015-07-15 MED ORDER — MORPHINE SULFATE (PF) 2 MG/ML IV SOLN
1.0000 mg | INTRAVENOUS | Status: DC | PRN
  Administered 2015-07-15: 1 mg via INTRAVENOUS
  Administered 2015-07-15 – 2015-07-16 (×3): 2 mg via INTRAVENOUS
  Filled 2015-07-15 (×4): qty 1

## 2015-07-15 MED ORDER — METOPROLOL TARTRATE 5 MG/5ML IV SOLN
5.0000 mg | Freq: Once | INTRAVENOUS | Status: AC
  Administered 2015-07-15: 5 mg via INTRAVENOUS
  Filled 2015-07-15: qty 5

## 2015-07-15 MED ORDER — LORAZEPAM 2 MG/ML IJ SOLN: 0.5000 mg | Freq: Four times a day (QID) | INTRAMUSCULAR | Status: DC

## 2015-07-15 MED ORDER — IPRATROPIUM-ALBUTEROL 0.5-2.5 (3) MG/3ML IN SOLN
3.0000 mL | Freq: Three times a day (TID) | RESPIRATORY_TRACT | Status: DC
  Administered 2015-07-15 – 2015-07-16 (×2): 3 mL via RESPIRATORY_TRACT
  Filled 2015-07-15 (×3): qty 3

## 2015-07-15 MED ORDER — SODIUM CHLORIDE 0.9 % IV SOLN: Freq: Once | INTRAVENOUS | Status: AC

## 2015-07-15 MED ORDER — HALOPERIDOL LACTATE 5 MG/ML IJ SOLN
2.0000 mg | Freq: Four times a day (QID) | INTRAMUSCULAR | Status: DC | PRN
  Administered 2015-07-15 – 2015-07-16 (×2): 2 mg via INTRAVENOUS
  Filled 2015-07-15 (×2): qty 1

## 2015-07-15 MED ORDER — SODIUM CHLORIDE 0.9 % IV SOLN
Freq: Once | INTRAVENOUS | Status: AC
  Administered 2015-07-15: 06:00:00 via INTRAVENOUS

## 2015-07-15 NOTE — Progress Notes (Signed)
Patient ID: [name], female   DOB: 1922-01-24, 82 y.o.   MRN: [id number]   PROGRESS NOTE    [name]  JME:[id number] DOB: [birth year] DOA: 06/29/2015  PCP: Hennie Duos, MD   Brief Narrative:  82 y.o. female with a medical history of dementia, CAD, hypertension, presented to the emergency department for fever and rash. Patient resides in nursing facility. She supposedly was becoming more altered compared to her baseline.   ED Course: Found to have platelets of less than 5, hemoglobin of 6.8, white blood cell of 15. Patient given vancomycin and Zosyn in the emergency department. [facility] called for admission.  Assessment & Plan: Severe Sepsis with multiorgan system failure, likely secondary to UTI - pt met criteria for sepsis on admission with T > 100.3 F, HR > 90, WBC > 13 K, lactic acit > 22 - source appears to be E. Coli UTI (preliminary urine culture positive for E. Coli but final report pending) - no signs of PNA on CXR - pt was started on Vancomycin and Rocephin and will continue same regimen for now, day #2 - follow up on blood and urine cultures - lactic acid is WNL this AM and WNC has also stabilized but still with Tmax 100.9 F, monitor fever curve   Suspect DIC - Dr. Jana Hakim consulted - reviewed blood film and it showed no schistocytes and therefore no need for plasmapheresis  - INR has been normal, PTT now normal (mildly elevated yesterday), this and lack of schistocytes challenges the working diagnosis of DIC (which however remains the safest way to approach this case per Dr. Jana Hakim) - will continue to provide supportive care, platelet and blood transfusions, 2 U of each requested today - will repeat CBC with diff in AM - appreciate oncologist assistance with this challenging situation   Acute metabolic encephalopathy - imposed on known dementia - secondary to the above problems  - unclear what baseline mental status is as no family here to discuss  - pt  requiring restraints - will provide analgesia as needed   Normocytic anemia -Hemoglobin currently 5.8 - unknown if related to DIC - will repeat CBC in AM - no sings of active bleeding at this time   Hypernatremia and hyperchloremia - likely pre renal in etiology  - pt has already been on IVF, since she is getting platelets, will lower the rate of IVF infusion to 50 cc/hr - BMP in AM  Hypokalemia - supplemented and WNL this AM   History of CAD and PAD - Holding aspirin secondary to thrombocytopenia   Dementia - Hold Aricept  Essential hypertension - continue metoprolol with holding parameters - Hold amlodipine, lisinopril for now   Thoracic spine compression fracture - Stable on x-ray  DVT prophylaxis: None (platelets <5, petechiae on lower extremities  Code Status: DNR Family Communication: Awaiting for family to call me, asked RN to page me when family arrives Disposition Plan: Unknown at this time   Consultants:   Oncology  PCT  Procedures:   None  Antimicrobials:   Vanc and Rocephin 6/22 -->  Subjective: Pt remains confused.   Objective: Filed Vitals:   07/15/15 0530 07/15/15 0553 07/15/15 0554 07/15/15 0600  BP: 114/91  133/77 129/59  Pulse: 90 86 86 84  Temp: 97.9 F (36.6 C) 97.9 F (36.6 C)    TempSrc: Oral     Resp: '27 24 19 19  ' Height:      Weight:  SpO2: 100% 98% 98% 97%    Intake/Output Summary (Last 24 hours) at 07/15/15 0656 Last data filed at 07/15/15 5397  Gross per 24 hour  Intake 2936.92 ml  Output    200 ml  Net 2736.92 ml   Filed Weights   07/12/2015 1306 07/19/2015 2030 07/15/15 0310  Weight: 59.875 kg (132 lb) 61 kg (134 lb 7.7 oz) 61 kg (134 lb 7.7 oz)    Examination:  General exam: Appears confused, garbled speech, not following any commands, in restraints  Respiratory system: Poor inspiratory effort, diminished breath sounds at bases with bilateral rhonchi mostly at bases  Cardiovascular system: S1 & S2 heard,  RRR. No rubs, gallops or clicks. No pedal edema. Gastrointestinal system: Abdomen is nondistended, soft and nontender.  Central nervous system: Awake but confused and restless, unable to follow any commands Extremities: Moving all 4 extremities spontaneously, petechia and ecchymosis noted  Skin: diffuse petechia extremities and trunk, some ecchymoses also noted on extremities but no spontaneous bleeding    Data Reviewed: I have personally reviewed following labs and imaging studies  CBC:  Recent Labs Lab 07/05/2015 1354 07/22/2015 1848 07/15/15 0149  WBC 15.0*  --  9.6  NEUTROABS 13.0*  --   --   HGB 6.8*  --  5.8*  HCT 20.4*  --  18.6*  MCV 85.0  --  88.2  PLT <5* <5* <5*   Basic Metabolic Panel:  Recent Labs Lab 07/08/2015 1319 07/15/15 0149  NA 147* 147*  K 3.0* 3.7  CL 118* 124*  CO2 22 21*  GLUCOSE 129* 91  BUN 34* 24*  CREATININE 0.94 0.78  CALCIUM 8.0* 7.6*   Liver Function Tests:  Recent Labs Lab 07/02/2015 1319 07/15/15 0149  AST 110* 59*  ALT 64* 49  ALKPHOS 80 63  BILITOT 1.2 0.9  PROT 5.3* 4.6*  ALBUMIN 2.8* 2.2*   Coagulation Profile:  Recent Labs Lab 07/08/2015 1848 07/06/2015 2045  INR 1.33 1.41   CBG:  Recent Labs Lab 06/24/2015 2231  GLUCAP 104*   Anemia Panel:  Recent Labs  07/09/2015 1319  VITAMINB12 687  FERRITIN 98   Urine analysis:    Component Value Date/Time   COLORURINE AMBER* 07/08/2015 1531   APPEARANCEUR TURBID* 07/13/2015 1531   LABSPEC 1.023 06/27/2015 1531   PHURINE 5.5 07/02/2015 1531   GLUCOSEU NEGATIVE 07/19/2015 1531   HGBUR LARGE* 07/16/2015 1531   BILIRUBINUR NEGATIVE 07/20/2015 1531   [place] 07/13/2015 1531   PROTEINUR 30* 07/22/2015 1531   UROBILINOGEN 0.2 12/14/2013 1645   NITRITE POSITIVE* 06/27/2015 1531   LEUKOCYTESUR MODERATE* 06/27/2015 1531    Recent Results (from the past 240 hour(s))  Culture, blood (Routine x 2)     Status: None (Preliminary result)   Collection Time: 07/09/2015   1:54 PM  Result Value Ref Range Status   Specimen Description BLOOD LEFT FOREARM  Final   Special Requests IN PEDIATRIC BOTTLE 2 ML  Final   Culture PENDING  Incomplete   Report Status PENDING  Incomplete  MRSA PCR Screening     Status: None   Collection Time: 06/29/2015  8:37 PM  Result Value Ref Range Status   MRSA by PCR NEGATIVE NEGATIVE Final    Comment:        The GeneXpert MRSA Assay (FDA approved for NASAL specimens only), is one component of a comprehensive MRSA colonization surveillance program. It is not intended to diagnose MRSA infection nor to guide or monitor treatment for MRSA infections.  Radiology Studies: Dg Chest 2 View  06/27/2015  CLINICAL DATA:  Fever and rash. Altered mental status. Mild hypoxia. EXAM: CHEST  2 VIEW COMPARISON:  12/14/2013 FINDINGS: Lungs are hypoinflated with linear scarring/ atelectasis right base. Subtle chronic opacification of the left costophrenic angle. No acute airspace consolidation or effusion. Cardiomediastinal silhouette is within normal. There is calcified plaque over the thoracic aorta. Stable mild compression fracture over the lower thoracic spine. IMPRESSION: Hypoinflation without acute cardiopulmonary disease. Chronic bibasilar changes. Aortic atherosclerosis. Stable lower thoracic spine mild compression fracture. Electronically Signed   By: Marin Olp M.D.   On: 07/11/2015 13:49      Scheduled Meds: . sodium chloride   Intravenous STAT  . antiseptic oral rinse  7 mL Mouth Rinse BID  . cefTRIAXone (ROCEPHIN)  IV  1 g Intravenous Q24H  . ferrous sulfate  325 mg Oral Q breakfast  . LORazepam  0.5 mg Oral Once  . metoprolol  25 mg Oral BID  . senna-docusate  1 tablet Oral Daily  . traMADol  50 mg Oral Once  . vancomycin  750 mg Intravenous Q24H  . vitamin B-12  1,000 mcg Oral QODAY   Continuous Infusions: . sodium chloride 1,000 mL (06/26/2015 2248)  . 0.9 % NaCl with KCl 40 mEq / L 75 mL/hr (07/21/2015 2106)      LOS: 1 day    Time spent: 20 minutes    Faye Ramsay, MD [facility] Pager [phone]  If 7PM-7AM, please contact night-coverage [email or web address] Password [facility] 07/15/2015, 6:56 AM

## 2015-07-15 NOTE — Progress Notes (Addendum)
Ist unit of PRBC group O  negative completed with no reaction

## 2015-07-15 NOTE — Progress Notes (Signed)
CRITICAL VALUE ALERT  Critical value received:  Platelets <5   Date of notification:  07/15/2015  Time of notification:  1937  Critical value read back:Yes.    Nurse who received alert:  Epimenio Sarin, RN  MD notified (1st page):  Rogue Bussing   Time of first page:  1938  MD notified (2nd page):  Time of second page:  Responding MD:  Hematolgy, Dr. Lindi Adie   Time MD responded:  [phone]

## 2015-07-15 NOTE — Progress Notes (Signed)
Patient continue to be agitated, verbalized pain ,MD Doyle Askew notified Morphine order, Patient medicated and  She became less restless. Ativan given [id number] also for anxiety . Fed with dinner pt took only 4 bites and did not wants  Any more. .bath given mouth care done and made comfortable in bed.

## 2015-07-15 NOTE — Evaluation (Addendum)
Clinical/Bedside Swallow Evaluation Patient Details  Name: [name] MRN: [id number] Date of Birth: [birth year]  Today's Date: 07/15/2015 Time: SLP Start Time (ACUTE ONLY): 1215 SLP Stop Time (ACUTE ONLY): 1230 SLP Time Calculation (min) (ACUTE ONLY): 15 min  Past Medical History:  Past Medical History  Diagnosis Date  . Diverticulosis of colon (without mention of hemorrhage) 06/24/2006  . Vitamin B12 deficiency   . Alzheimer's disease   . HTN (hypertension)   . Depression   . PVD (peripheral vascular disease) ([place])   . CAD (coronary artery disease)   . GERD (gastroesophageal reflux disease)   . H. pylori infection 2004  . Presbyesophagus   . Iron deficiency anemia, unspecified 01/02/2012  . Personal history of fall 01/04/2009  . Unspecified constipation   . Hemorrhage of rectum and anus   . Chest pain, unspecified   . Acute pharyngitis   . Edema   . Cataract   . HLD (hyperlipidemia)   . Osteoporosis, unspecified   . Carcinoma of cecum ([place]) 1992    History of  . Malignant neoplasm of colon, unspecified site   . Fracture of femoral neck, right ([place]) 12/21/2013  . Dysphagia 08/07/2011  . Primary osteoarthritis involving multiple joints 05/17/2014   Past Surgical History:  Past Surgical History  Procedure Laterality Date  . Gallbladder surgery    . Abdominal hysterectomy  1995    with bso  . Right colectomy    . Cataract extraction  2001  . Cholecystectomy Right   . Hip arthroplasty Right 12/15/2013    Procedure: HEMIARTHROPLASTY RIGHTHIP;  Surgeon: Newt Minion, MD;  Location: [facility];  Service: Orthopedics;  Laterality: Right;   HPI:  Pt is a 82 y.o. female with PMH of dementia, CAD, hypertension, presented to the emergency department for fever and rash. Patient resides in nursing facility. She supposedly was becoming more altered compared to her baseline. CXR showed chronic bibasilar changes without effusions. Pt has been ageitated and RN reports pt not taking  meds. Bedside swallow eval ordered to assess safest diet.    Assessment / Plan / Recommendation Clinical Impression  Pt showed no overt s/s of aspiration at bedside. At time of eval pt was crying and inconsolable but still accepted puree and thin liquids by straw. Swallow appeared timely, no evidence of residuals although difficult to fully visualize. Current baseline cognitive/ swallowing status unknown; pt did have a MBS in 2015 with dysphagia 2/ thin liquid recommendations. Pt's speech is unintelligible/ mostly jargon and pt is unable to follow directions at this time. Aspiration risk appears moderate given cognitive status. Recommend initiating dysphagia 1/ thin liquids, meds crushed in puree, full supervision to assist in feeding, provide small bites/ sips by straw, ensure pt sitting upright and alert. Will continue to follow for diet tolerance.    Aspiration Risk  Mild aspiration risk;Moderate aspiration risk    Diet Recommendation Dysphagia 1 (Puree);Thin liquid   Liquid Administration via: Straw Medication Administration: Crushed with puree Supervision: Staff to assist with self feeding;Full supervision/cueing for compensatory strategies Compensations: Slow rate;Small sips/bites Postural Changes: Seated upright at 90 degrees    Other  Recommendations Oral Care Recommendations: Oral care BID   Follow up Recommendations  Skilled Nursing facility    Frequency and Duration min 1 x/week  1 week       Prognosis Prognosis for Safe Diet Advancement: Fair Barriers to Reach Goals: Cognitive deficits      Swallow Study   General HPI: Pt is a  82 y.o. female with PMH of dementia, CAD, hypertension, presented to the emergency department for fever and rash. Patient resides in nursing facility. She supposedly was becoming more altered compared to her baseline. CXR showed chronic bibasilar changes without effusions. Pt has been ageitated and RN reports pt not taking meds. Bedside swallow eval  ordered to assess safest diet.  Type of Study: Bedside Swallow Evaluation Diet Prior to this Study: NPO Temperature Spikes Noted: No Respiratory Status: Room air History of Recent Intubation: No Behavior/Cognition: Alert;Confused (crying/ inconsolable) Oral Cavity Assessment: Within Functional Limits Oral Cavity - Dentition: Edentulous Vision:  (eyes closed) Self-Feeding Abilities: Total assist Patient Positioning: Upright in bed Baseline Vocal Quality: Normal Volitional Cough: Cognitively unable to elicit Volitional Swallow: Unable to elicit    Oral/Motor/Sensory Function Overall Oral Motor/Sensory Function: Other (comment) (difficult to assess due to cognition)   Ice Chips Ice chips: Not tested   Thin Liquid Thin Liquid: Within functional limits Presentation: Straw    Nectar Thick Nectar Thick Liquid: Not tested   Honey Thick Honey Thick Liquid: Not tested   Puree Puree: Within functional limits Presentation: Spoon   Solid   GO   Solid: Not tested        Kern Reap, MA, CCC-SLP 07/15/2015,12:39 PM [phone]

## 2015-07-15 NOTE — Progress Notes (Signed)
Second unit of PRBC O  Negative completed pt tolerated well though BP was high due to agitation. No reaction noted. 3 hours post transfusion lab just drawn and result pending.

## 2015-07-15 NOTE — Progress Notes (Signed)
ADDENDUM:  Blood film reviewed yesterday showed no schistocytes, so as noted no need for plasmapheresis.   INR has been normal, PTT now normal (mildly elevated yesterday)-- that and the lack of schistocytes also challenges the working diagnosis of DIC (which however remains the safest way to approach this case in my opinion)  Depending on how the CBC looks tomorrow she may or may not need heme intervention (N-plate?)-- I will ask my partner, Dr Lindi Adie, to review in AM  Appreciate participating in this challenging case!

## 2015-07-15 NOTE — Progress Notes (Signed)
Patient has orders to transfuse platelets. Patient had a temp of 101.4. Tylenol was given rectally. Patient temp an hour after Tylenol is 101.2 orally. Per hematology, Dr. Lindi Adie, verbal orders to give another Tylenol now and wait to transfuse platelets until temperature is below 100. Will initiate verbal orders and continue to monitor patient.

## 2015-07-15 NOTE — Progress Notes (Signed)
CRITICAL VALUE ALERT  Critical value received:  Platelet <5, Hemoglobin 5.8  Date of notification: 07/15/2015  Time of notification:  0334  Critical value read back:Yes.    Nurse who received alert:  Epimenio Sarin, RN  MD notified (1st page):  Rogue Bussing  Time of first page:  [phone]  MD notified (2nd page):  Time of second page:  Responding MD:  Rogue Bussing   Time MD responded:  [phone]

## 2015-07-15 NOTE — Progress Notes (Signed)
Patient in bed alert open eyes spontanously but demented speech slurred and gabled .does not follow command. Noted pt very restless , agitated and crying . Md made aware. orders given and ativan 0.5 mg given pt calm down for about 30 mini and started crying again. Difficult to console.  Peri care done and pt reposition for comfort. RN will continue to monitor .

## 2015-07-15 NOTE — Progress Notes (Addendum)
Upon shift assessment pt had garbled speech. MD at bedside and aware. No new orders received will continue to assess and monitor the pt closely.

## 2015-07-15 NOTE — Progress Notes (Signed)
Pt continued to be agitated and crying unable to console  After repositioning and made comfortable  md call haldol  given

## 2015-07-15 NOTE — Care Management Important Message (Signed)
Important Message  Patient Details  Name: [name] MRN: [id number] Date of Birth: 1922-03-28   Medicare Important Message Given:  Yes    [name] 07/15/2015, 9:44 AM

## 2015-07-16 ENCOUNTER — Inpatient Hospital Stay (HOSPITAL_COMMUNITY): Payer: Medicare Other

## 2015-07-16 DIAGNOSIS — D65 Disseminated intravascular coagulation [defibrination syndrome]: Secondary | ICD-10-CM

## 2015-07-16 DIAGNOSIS — Z515 Encounter for palliative care: Secondary | ICD-10-CM | POA: Insufficient documentation

## 2015-07-16 LAB — PREPARE PLATELET PHERESIS
UNIT DIVISION: 0
Unit division: 0

## 2015-07-16 LAB — SAVE SMEAR

## 2015-07-16 LAB — CBC WITH DIFFERENTIAL/PLATELET
BASOS ABS: 0 10*3/uL (ref 0.0–0.1)
BASOS PCT: 0 %
EOS ABS: 0.1 10*3/uL (ref 0.0–0.7)
EOS PCT: 1 %
HCT: 30.7 % — ABNORMAL LOW (ref 36.0–46.0)
HEMOGLOBIN: 9.7 g/dL — AB (ref 12.0–15.0)
LYMPHS ABS: 0.6 10*3/uL — AB (ref 0.7–4.0)
Lymphocytes Relative: 5 %
MCH: 27.1 pg (ref 26.0–34.0)
MCHC: 31.6 g/dL (ref 30.0–36.0)
MCV: 85.8 fL (ref 78.0–100.0)
Monocytes Absolute: 0.8 10*3/uL (ref 0.1–1.0)
Monocytes Relative: 7 %
NEUTROS PCT: 87 %
Neutro Abs: 10.1 10*3/uL — ABNORMAL HIGH (ref 1.7–7.7)
PLATELETS: 7 10*3/uL — AB (ref 150–400)
RBC: 3.58 MIL/uL — AB (ref 3.87–5.11)
RDW: 15 % (ref 11.5–15.5)
WBC: 11.7 10*3/uL — AB (ref 4.0–10.5)

## 2015-07-16 LAB — TYPE AND SCREEN
ABO/RH(D): O NEG
ANTIBODY SCREEN: NEGATIVE
UNIT DIVISION: 0
Unit division: 0

## 2015-07-16 LAB — URINE CULTURE

## 2015-07-16 LAB — COMPREHENSIVE METABOLIC PANEL
ALT: 43 U/L (ref 14–54)
AST: 43 U/L — AB (ref 15–41)
Albumin: 3.2 g/dL — ABNORMAL LOW (ref 3.5–5.0)
Alkaline Phosphatase: 83 U/L (ref 38–126)
Anion gap: 11 (ref 5–15)
BUN: 15 mg/dL (ref 6–20)
CHLORIDE: 116 mmol/L — AB (ref 101–111)
CO2: 19 mmol/L — AB (ref 22–32)
Calcium: 9.1 mg/dL (ref 8.9–10.3)
Creatinine, Ser: 0.8 mg/dL (ref 0.44–1.00)
Glucose, Bld: 107 mg/dL — ABNORMAL HIGH (ref 65–99)
POTASSIUM: 3.6 mmol/L (ref 3.5–5.1)
SODIUM: 146 mmol/L — AB (ref 135–145)
Total Bilirubin: 2.7 mg/dL — ABNORMAL HIGH (ref 0.3–1.2)
Total Protein: 6.3 g/dL — ABNORMAL LOW (ref 6.5–8.1)

## 2015-07-16 MED ORDER — FUROSEMIDE 10 MG/ML IJ SOLN
INTRAMUSCULAR | Status: AC
  Filled 2015-07-16: qty 4

## 2015-07-16 MED ORDER — MORPHINE SULFATE (PF) 2 MG/ML IV SOLN: 1.0000 mg | INTRAVENOUS | Status: DC

## 2015-07-16 MED ORDER — GLYCOPYRROLATE 0.2 MG/ML IJ SOLN
0.1000 mg | INTRAMUSCULAR | Status: DC | PRN
  Administered 2015-07-17 – 2015-07-18 (×4): 0.1 mg via INTRAVENOUS
  Filled 2015-07-16 (×2): qty 1
  Filled 2015-07-16: qty 0.5
  Filled 2015-07-16 (×2): qty 1

## 2015-07-16 MED ORDER — METOPROLOL TARTRATE 5 MG/5ML IV SOLN
INTRAVENOUS | Status: AC
  Administered 2015-07-16: 5 mg
  Filled 2015-07-16: qty 5

## 2015-07-16 MED ORDER — METOPROLOL TARTRATE 5 MG/5ML IV SOLN
5.0000 mg | Freq: Four times a day (QID) | INTRAVENOUS | Status: DC
Start: 2015-07-16 — End: 2015-07-16
  Administered 2015-07-16: 5 mg via INTRAVENOUS
  Filled 2015-07-16: qty 5

## 2015-07-16 MED ORDER — SODIUM CHLORIDE 0.9 % IV SOLN
1.0000 mg/h | INTRAVENOUS | Status: DC
  Administered 2015-07-16: 1 mg/h via INTRAVENOUS
  Administered 2015-07-17: 2 mg/h via INTRAVENOUS
  Filled 2015-07-16: qty 10

## 2015-07-16 MED ORDER — SODIUM CHLORIDE 0.9 % IV SOLN
250.0000 mg | Freq: Three times a day (TID) | INTRAVENOUS | Status: DC
  Administered 2015-07-16: 250 mg via INTRAVENOUS
  Filled 2015-07-16 (×3): qty 250

## 2015-07-16 MED ORDER — HYDRALAZINE HCL 20 MG/ML IJ SOLN
10.0000 mg | Freq: Four times a day (QID) | INTRAMUSCULAR | Status: DC | PRN
  Administered 2015-07-16: 10 mg via INTRAVENOUS
  Filled 2015-07-16: qty 1

## 2015-07-16 MED ORDER — SODIUM CHLORIDE 0.9 % IV SOLN
INTRAVENOUS | Status: DC
  Administered 2015-07-16: 21:00:00 via INTRAVENOUS

## 2015-07-16 MED ORDER — FUROSEMIDE 10 MG/ML IJ SOLN
INTRAMUSCULAR | Status: AC
  Filled 2015-07-16: qty 2

## 2015-07-16 MED ORDER — FUROSEMIDE 10 MG/ML IJ SOLN
20.0000 mg | Freq: Once | INTRAMUSCULAR | Status: AC
  Administered 2015-07-16: 20 mg via INTRAVENOUS

## 2015-07-16 MED ORDER — MORPHINE SULFATE (PF) 2 MG/ML IV SOLN
1.0000 mg | INTRAVENOUS | Status: DC | PRN
  Administered 2015-07-16: 2 mg via INTRAVENOUS
  Filled 2015-07-16: qty 1

## 2015-07-16 MED ORDER — FUROSEMIDE 10 MG/ML IJ SOLN: 20.0000 mg | Freq: Once | INTRAMUSCULAR | Status: AC

## 2015-07-16 MED ORDER — MORPHINE SULFATE (PF) 2 MG/ML IV SOLN
1.0000 mg | Freq: Once | INTRAVENOUS | Status: AC
  Administered 2015-07-16: 1 mg via INTRAVENOUS
  Filled 2015-07-16: qty 1

## 2015-07-16 NOTE — Progress Notes (Signed)
Pharmacy Antibiotic Note [name] is a 82 y.o. female admitted on 07/20/2015 with ESBL E.coli UTI. Pharmacy has been consulted for Primaxin dosing.  Plan: 1. Begin Primaxin 250 mg every 8 hours 2. F/u LOT  Height: 5\' 2"  (157.5 cm) Weight: 139 lb 1.8 oz (63.1 kg) IBW/kg (Calculated) : 50.1  Temp (24hrs), Avg:99.9 F (37.7 C), Min:98.4 F (36.9 C), Max:102.2 F (39 C)   Recent Labs Lab 07/01/2015 1319 07/16/2015 1332 06/25/2015 1354 07/22/2015 1859 07/07/2015 2045 07/17/2015 2254 07/15/15 0149 07/15/15 0646 07/15/15 1842 07/16/15 0638  WBC  --   --  15.0*  --   --   --  9.6 10.5 11.5* 11.7*  CREATININE 0.94  --   --   --   --   --  0.78  --   --  0.80  LATICACIDVEN  --  1.22  --  2.07* 1.7 0.7  --   --   --   --     Estimated Creatinine Clearance: 40.8 mL/min (by C-G formula based on Cr of 0.8).    Allergies  Allergen Reactions  . Captopril-Hydrochlorothiazide Other (See Comments)    Reaction unknown  . Niacin And Related Other (See Comments)    Reaction unknown  . Urispas [Flavoxate] Other (See Comments)    Reaction unknown  . Verapamil Other (See Comments)    Reaction unknown    Antimicrobials this admission: 6/24 Imipinem >>   Dose adjustments this admission: n/a  Microbiology results: 6/22 BCx: ngtd 6/22 UCx: E.coli (ESBL) S to Primaxin, gentamycin and bactrim    Thank you for allowing pharmacy to be a part of this patient's care.  Vincenza Hews, PharmD, BCPS 07/16/2015, 3:32 PM Pager: [phone]

## 2015-07-16 NOTE — Progress Notes (Signed)
Patient awaken short of breath, moaning, agitated, and increased blood pressure. Upon assessment, patient has expiratory wheezes. RRT called to give prn breathing treatment. Rogue Bussing ([facility]) called and notified. Hydralazine and 20 of Lasix was ordered. Will initiate orders and continue to monitor patient.

## 2015-07-16 NOTE — Progress Notes (Signed)
HEMATOLOGY-ONCOLOGY PROGRESS NOTE  SUBJECTIVE:Patient is not responding to call. She does winch for painful stimuli. Bruising and petechiae  OBJECTIVE: REVIEW OF SYSTEMS:  Unobtainable since patient is unresponsive  PHYSICAL EXAMINATION: ECOG PERFORMANCE STATUS: 4 - Bedbound  Filed Vitals:   07/16/15 0435 07/16/15 0510  BP: 168/129 153/71  Pulse: 115 120  Temp:    Resp: 33 29   Filed Weights   07/22/2015 2030 07/15/15 0310 07/16/15 0500  Weight: 134 lb 7.7 oz (61 kg) 134 lb 7.7 oz (61 kg) 139 lb 1.8 oz (63.1 kg)    GENERAL:not responding SKIN: petechiae LUNGS: tachypnea  HEART: tachycardia  ABDOMEN:abdomen soft, non-tender Musculoskeletal:gen weakness and wasting NEURO: unresponsive to commands.   LABORATORY DATA:  I have reviewed the data as listed CMP Latest Ref Rng 07/16/2015 07/15/2015 06/25/2015  Glucose 65 - 99 mg/dL 107(H) 91 129(H)  BUN 6 - 20 mg/dL 15 24(H) 34(H)  Creatinine 0.44 - 1.00 mg/dL 0.80 0.78 0.94  Sodium 135 - 145 mmol/L 146(H) 147(H) 147(H)  Potassium 3.5 - 5.1 mmol/L 3.6 3.7 3.0(L)  Chloride 101 - 111 mmol/L 116(H) 124(H) 118(H)  CO2 22 - 32 mmol/L 19(L) 21(L) 22  Calcium 8.9 - 10.3 mg/dL 9.1 7.6(L) 8.0(L)  Total Protein 6.5 - 8.1 g/dL 6.3(L) 4.6(L) 5.3(L)  Total Bilirubin 0.3 - 1.2 mg/dL 2.7(H) 0.9 1.2  Alkaline Phos 38 - 126 U/L 83 63 80  AST 15 - 41 U/L 43(H) 59(H) 110(H)  ALT 14 - 54 U/L 43 49 64(H)    Lab Results  Component Value Date   WBC 11.7* 07/16/2015   HGB 9.7* 07/16/2015   HCT 30.7* 07/16/2015   MCV 85.8 07/16/2015   PLT 7* 07/16/2015   NEUTROABS 10.1* 07/16/2015    ASSESSMENT AND PLAN: 1. Profound thrombocytopenia: Most likely due to sepsis and is now unresponsive. Reviewed notes. It appears that urinary tract infection with sepsis is the underlying etiology. No schistocytes on smear.  2. Since it appears that goals of treatment are palliation until family arrives, I am not planning on giving any more platelets. We could  try using IVIG if the goals change  Unfortunately patient is too frail and has extremely poor prognosis. Please call if goals of treatment change.

## 2015-07-16 NOTE — Progress Notes (Signed)
Son arrived, spoke with him over the phone. He agreed that his mom looks uncomfortable, he really wants her to be more comfortable. Agreeable to transition to full comfort, placing on morphine drip and discontinuing cardiac monitor and other extra lines. Pharmacy consulted for assistance with placing order for morphine drip. Try To keep in the same unit tonight to ensure comfort unless beds needed.   Faye Ramsay, MD  [facility] Pager [phone]  If 7PM-7AM, please contact night-coverage [email or web address] Password [email or web address]

## 2015-07-16 NOTE — Progress Notes (Signed)
SLP Cancellation Note  Patient Details Name: LATOSHIA CZAJA MRN: [id number] DOB: 1922/12/01   Cancelled treatment:       Reason Eval/Treat Not Completed: Fatigue/lethargy limiting ability to participate   Shelly Flatten, [place], [facility] SLP [phone] Lamar Sprinkles 07/16/2015, 1:09 PM

## 2015-07-16 NOTE — Consult Note (Signed)
Consultation Note Date: 07/16/2015   Patient Name: [name]  DOB: 1922-07-04  MRN: [id number]  Age / Sex: 82 y.o., female  PCP: Hennie Duos, MD Referring Physician: Theodis Blaze, MD  Reason for Consultation: Establishing goals of care, Hospice Evaluation, Inpatient hospice referral, Non pain symptom management, Pain control, Psychosocial/spiritual support and Terminal Care  HPI/Patient Profile: 82 y.o. female  with past medical history of Dementia, coronary artery disease, hypertension, admitted on 07/08/2015 with altered mental status, fever and rash. Patient resides in a skilled nursing facility. In the emergency room she was found to have platelets of less than 5, hemoglobin of 6.8 white blood cell count of 15. She was started on antibiotics and admitted with severe sepsis with multiorgan system failure likely due to urinary tract infection. She has been transfused 2 units and her hemoglobin did respond and is now 9.7 however her platelets are 7. Chest x-ray performed 07/16/2015 shows new patchy airspace opacities bilaterally as well as small pleural effusions. Patient was noted to be wheezing and moaning this morning. Per chart review, Dr. Doyle Askew has spoken to patient's son, Khadeja Deadmond, who resides in [place] and has told him that his mother is terminally ill. .   Clinical Assessment and Goals of Care: Patient is minimally responsive to noxious stimuli. She is wearing 100% nonrebreather mask and still shows increased work of breathing. Her sats are in the 90s. Respiratory rate is 28. Heart rate is tachycardia and heart sounds very distant.  Addendum 1600: Pt febrile at 102.3, tachypneic. Grunting respirations. PRN MS04 2mg  given  NEXT OF KIN, son Nhyira Conliffe [phone]. Called and left message x 2 and did speak with him at [facility]   DNR/DNI Code Status/Advance Care  Planning:  DNR    Symptom Management:   Dyspnea: MS04 1-2 mg q3 prn. Monitor for need for schedule dosing or continuous infusion       Addendum 1600: Will schedule ms04 1 mg q4 atc  Pain: Begin with PRN MS04       Addendum 1600: Will schedule MS04  Secretions/Congestion: Will add PRN robinul 0.4mg  q4 prn  Agitation: Likely related to dyspnea. Recommend morphine as first line agent (see above recommendations); Ativan 0.5 mg every 6 as needed  Palliative Prophylaxis:   Aspiration, Bowel Regimen, Delirium Protocol, Eye Care, Frequent Pain Assessment, Oral Care, Palliative Wound Care and Turn Reposition  Additional Recommendations (Limitations, Scope, Preferences):  No Chemotherapy, No Hemodialysis, No Surgical Procedures and No Tracheostomy  Psycho-social/Spiritual:   Desire for further Chaplaincy support:no  Additional Recommendations: Grief/Bereavement Support  Prognosis:   Hours - Days  Discharge Planning: Anticipated Hospital Death      Primary Diagnoses: Present on Admission:  . TTP (thrombotic thrombocytopenic purpura) (H[facility]) . Dementia of the Alzheimer's type, with late onset, with delusions ([place]) . Anemia, iron deficiency . PAD (peripheral artery disease) ([place])  I have reviewed the medical record, interviewed the patient and family, and examined the patient. The following aspects are  pertinent.  Past Medical History  Diagnosis Date  . Diverticulosis of colon (without mention of hemorrhage) 06/24/2006  . Vitamin B12 deficiency   . Alzheimer's disease   . HTN (hypertension)   . Depression   . PVD (peripheral vascular disease) ([place])   . CAD (coronary artery disease)   . GERD (gastroesophageal reflux disease)   . H. pylori infection 2004  . Presbyesophagus   . Iron deficiency anemia, unspecified 01/02/2012  . Personal history of fall 01/04/2009  . Unspecified constipation   . Hemorrhage of rectum and anus   . Chest pain, unspecified   . Acute  pharyngitis   . Edema   . Cataract   . HLD (hyperlipidemia)   . Osteoporosis, unspecified   . Carcinoma of cecum ([place]) 1992    History of  . Malignant neoplasm of colon, unspecified site   . Fracture of femoral neck, right ([place]) 12/21/2013  . Dysphagia 08/07/2011  . Primary osteoarthritis involving multiple joints 05/17/2014   Social History   Social History  . Marital Status: Widowed    Spouse Name: N/A  . Number of Children: 2  . Years of Education: N/A   Occupational History  . Retired    Social History Main Topics  . Smoking status: Former Smoker    Types: Cigarettes  . Smokeless tobacco: Never Used  . Alcohol Use: No  . Drug Use: No  . Sexual Activity: No   Other Topics Concern  . None   Social History Narrative   Family History  Problem Relation Age of Onset  . Heart disease Mother   . Colon cancer Neg Hx    Scheduled Meds: . antiseptic oral rinse  7 mL Mouth Rinse BID  . cefTRIAXone (ROCEPHIN)  IV  1 g Intravenous Q24H  . furosemide      . ipratropium-albuterol  3 mL Nebulization TID  . metoprolol  5 mg Intravenous Q6H  . senna-docusate  1 tablet Oral Daily  . vancomycin  750 mg Intravenous Q24H  . vitamin B-12  1,000 mcg Oral QODAY   Continuous Infusions: . 0.9 % NaCl with KCl 40 mEq / L 50 mL/hr (07/16/15 0530)   PRN Meds:.[DISCONTINUED] acetaminophen **OR** acetaminophen, haloperidol lactate, hydrALAZINE, ipratropium-albuterol, LORazepam, morphine injection, ondansetron **OR** ondansetron (ZOFRAN) IV Medications Prior to Admission:  Prior to Admission medications   Medication Sig Start Date End Date Taking? Authorizing Provider  amLODipine (NORVASC) 10 MG tablet Take 10 mg by mouth daily. For hypertension   Yes Historical Provider, MD  aspirin EC 325 MG tablet Take 1 tablet (325 mg total) by mouth daily. 12/15/13  Yes Newt Minion, MD  Cholecalciferol (VITAMIN D) 2000 UNITS tablet Take 2,000 Units by mouth daily. For osteoporosis   Yes Historical  Provider, MD  donepezil (ARICEPT) 5 MG tablet Take 5 mg by mouth at bedtime.   Yes Historical Provider, MD  ferrous sulfate 325 (65 FE) MG tablet Take 325 mg by mouth daily with breakfast.   Yes Historical Provider, MD  HYDROcodone-acetaminophen (NORCO/VICODIN) 5-325 MG tablet Take 1 tablet by mouth every 6 (six) hours as needed for moderate pain.   Yes Historical Provider, MD  ipratropium-albuterol (DUONEB) 0.5-2.5 (3) MG/3ML SOLN Take 3 mLs by nebulization every 6 (six) hours as needed (shortness of breath.).   Yes Historical Provider, MD  Lactase (LACTAID PO) Take 1 tablet by mouth every morning.   Yes Historical Provider, MD  lisinopril (PRINIVIL,ZESTRIL) 10 MG tablet Take 10 mg by mouth daily.  Yes Historical Provider, MD  metoprolol (LOPRESSOR) 25 MG tablet Take 1 tablet (25 mg total) by mouth 2 (two) times daily. If pulse <50 or SBP <100 hold med and notify MD. 04/08/13  Yes Tiffany L Reed, DO  mirtazapine (REMERON) 15 MG tablet Take 15 mg by mouth at bedtime.   Yes Historical Provider, MD  sennosides-docusate sodium (SENOKOT-S) 8.6-50 MG tablet Take 1 tablet by mouth daily.   Yes Historical Provider, MD  sucralfate (CARAFATE) 1 GM/10ML suspension Take 10 mLs (1 g total) by mouth 2 (two) times daily. 04/27/15  Yes Gerlene Fee, NP  traZODone (DESYREL) 50 MG tablet Take 50 mg by mouth at bedtime.   Yes Historical Provider, MD  vitamin B-12 (CYANOCOBALAMIN) 1000 MCG tablet Take 1,000 mcg by mouth every other day.   Yes Historical Provider, MD   Allergies  Allergen Reactions  . Captopril-Hydrochlorothiazide Other (See Comments)    Reaction unknown  . Niacin And Related Other (See Comments)    Reaction unknown  . Urispas [Flavoxate] Other (See Comments)    Reaction unknown  . Verapamil Other (See Comments)    Reaction unknown   Review of Systems  Unable to perform ROS: Acuity of condition    Physical Exam  Constitutional: She appears well-developed.  Cardiovascular:  Tachy; very  distant heart sounds  Pulmonary/Chest:  Increased work of breathing; NRB sats 90's  Abdominal: Soft. Bowel sounds are normal.  Neurological:  Minimally responsive even to noxious stimuli  Skin: Skin is warm.  Petechiae to UE and LE, chest  Nursing note and vitals reviewed.   Vital Signs: BP 136/67 mmHg  Pulse 96  Temp(Src) 102.2 F (39 C) (Oral)  Resp 23  Ht 5\' 2"  (1.575 m)  Wt 63.1 kg (139 lb 1.8 oz)  BMI 25.44 kg/m2  SpO2 93% Pain Assessment: PAINAD       SpO2: SpO2: 93 % O2 Device:SpO2: 93 % O2 Flow Rate: .O2 Flow Rate (L/min): 8 L/min  IO: Intake/output summary:  Intake/Output Summary (Last 24 hours) at 07/16/15 0953 Last data filed at 07/16/15 0700  Gross per 24 hour  Intake 2857.75 ml  Output      0 ml  Net 2857.75 ml    LBM: Last BM Date: 07/15/15 Baseline Weight: Weight: 59.875 kg (132 lb) Most recent weight: Weight: 63.1 kg (139 lb 1.8 oz)     Palliative Assessment/Data:   Flowsheet Rows        Most Recent Value   Intake Tab    Referral Department  Hospitalist   Unit at Time of Referral  Cardiac/Telemetry Unit   Palliative Care Primary Diagnosis  Sepsis/Infectious Disease   Date Notified  07/16/15   Palliative Care Type  New Palliative care   Reason for referral  Clarify Goals of Care, Counsel Regarding Hospice, Pain, Non-pain Symptom, End of Life Care Assistance   Date of Admission  06/29/2015   Date first seen by Palliative Care  07/16/15   # of days Palliative referral response time  0 Day(s)   # of days IP prior to Palliative referral  2   Clinical Assessment    Palliative Performance Scale Score  20%   Pain Max last 24 hours  Not able to report   Pain Min Last 24 hours  Not able to report   Dyspnea Max Last 24 Hours  Not able to report   Dyspnea Min Last 24 hours  Not able to report   Nausea Max Last 24 Hours  Not able to report   Nausea Min Last 24 Hours  Not able to report   Anxiety Max Last 24 Hours  Not able to report   Anxiety Min  Last 24 Hours  Not able to report   Other Max Last 24 Hours  Not able to report   Psychosocial & Spiritual Assessment    Palliative Care Outcomes       Time In: 0900 Time Out: 1020 Time Total: 80 min Greater than 50%  of this time was spent counseling and coordinating care related to the above assessment and plan. Pt's son en route from [place] and I did get to speak with him. I did convey my concerns that she could pass tonight. He hopes to be here about 730pm  Signed by: Dory Horn, NP   Please contact Palliative Medicine Team phone at [phone] for questions and concerns.  For individual provider: See Shea Evans

## 2015-07-16 NOTE — Progress Notes (Signed)
Patient ID: [name], female   DOB: Jun 13, 1922, 82 y.o.   MRN: [id number]   PROGRESS NOTE    [name]  ESP:[id number] DOB: 03-21-1922 DOA: 07/21/2015  PCP: Hennie Duos, MD   Brief Narrative:  82 y.o. female with a medical history of dementia, CAD, hypertension, presented to the emergency department for fever and rash. Patient resides in nursing facility. She supposedly was becoming more altered compared to her baseline.   ED Course: Found to have platelets of less than 5, hemoglobin of 6.8, white blood cell of 15. Patient given vancomycin and Zosyn in the emergency department. [facility] called for admission.  Assessment & Plan: Severe Sepsis with multiorgan system failure, likely secondary to UTI - pt met criteria for sepsis on admission with T > 100.3 F, HR > 90, WBC > 13 K, lactic acit > 22 - source appears to be E. Coli UTI (preliminary urine culture positive for E. Coli but final report pending) - no signs of PNA on CXR - pt was started on Vancomycin and Rocephin and will continue same regimen for now, day #3 - Patient continues to clinically deteriorate, not responding to current medical measures   Acute respiratory distress with hypoxia - Worsening respiratory status this morning 07/16/2015 requiring placement on nonrebreather - Patient appears uncomfortable, congested on exam - Patient has been given one dose of Lasix 20 mg IV, still congested and unable to take off nonrebreather - We'll give additional dose of Lasix now 20 mg IV - Discussed current critical nature of patient's illness with son over the phone - Son lives in [place] he understands the critical nature of his mother's illness - Son is very clear that patient's priority and his priority is her comfort, he will try to be here in [place] as soon as possible and he is asking to leave her on nonrebreather if needed but if patient continues to deteriorate son is very clear he wants [place] to do everything we can  to keep his mother comfortable  Suspect DIC - Dr. Jana Hakim consulted - reviewed blood film and it showed no schistocytes and therefore no need for plasmapheresis  - INR has been normal, PTT now normal (mildly elevated yesterday), this and lack of schistocytes challenges the working diagnosis of DIC (which however remains the safest way to approach this case per Dr. Jana Hakim) - Patient had 2 units of PRBC transfused 07/15/2015 with appropriate increase in posttransfusion hemoglobin from 6.5 --> 9.7 - However, posttransfusion platelets even though increased from 5 --> 24 (07/15/2015), platelets down to 7 again this morning  - Son was made aware of these results and understands there may not be anything else we can do to help his mother gets through this critical illness  - Again he is very clear that he wants [place] to do everything we can to keep his mother comfortable  - Palliative care has been consulted for further assistance   Acute metabolic encephalopathy - imposed on known dementia - secondary to the above problems  - stop all by mouth medications  - Change medications to IV when possible  - Allow morphine as needed to keep comfortable   Normocytic anemia - unknown if related to DIC - Hemoglobin up appropriately after transfusion of 2 units of blood  - Please note that patient has diffuse ecchymosis but no signs of active bleeding   Hypernatremia and hyperchloremia - likely pre renal in etiology  - sodium mildly elevated but overall better  Hypokalemia - supplemented and WNL this AM   History of CAD and PAD - Holding aspirin secondary to thrombocytopenia   Dementia - Hold Aricept  Essential hypertension - continue metoprolol but changed to IV - Hold amlodipine, lisinopril for now   Thoracic spine compression fracture - Stable on x-ray  DVT prophylaxis: None (platelets <5, petechiae on lower extremities  Code Status: DNR Family Communication: spoke with son who lives in  [place], he will try to be here as soon as possible that will likely be 3 PM today. Son is aware of critical nature of patient's illness and gives [place] his verbal order to keep his mother comfortable with morphine drip if needed even if he is not physically present at the bedside. I made him aware that his mother may not make it by the time he arrives to [place]. Mr. Dejonge understands. Disposition Plan: Unknown at this time   Consultants:   Oncology  PCT  Procedures:   None  Antimicrobials:   Vanc and Rocephin 6/22 -->  Subjective: Pt remains confused.  More shortness of breath this morning requiring nonrebreather.   Objective: Filed Vitals:   07/16/15 0435 07/16/15 0500 07/16/15 0510 07/16/15 0625  BP: 168/129  153/71   Pulse: 115  120   Temp:      TempSrc:      Resp: 33  29   Height:      Weight:  63.1 kg (139 lb 1.8 oz)    SpO2: 96%  93% 90%    Intake/Output Summary (Last 24 hours) at 07/16/15 0723 Last data filed at 07/16/15 0600  Gross per 24 hour  Intake 3001.75 ml  Output      0 ml  Net 3001.75 ml   Filed Weights   07/08/2015 2030 07/15/15 0310 07/16/15 0500  Weight: 61 kg (134 lb 7.7 oz) 61 kg (134 lb 7.7 oz) 63.1 kg (139 lb 1.8 oz)    Examination:  General exam: Lethargic, on nonrebreather, visibly short of breath Respiratory system: Poor inspiratory effort, diminished breath sounds throughout with crackles   Cardiovascular system: tachycardic with distant heart sounds Gastrointestinal system: Abdomen is nondistended, soft and nontender.  Central nervous system: lethargic, unable to open eyes, not moving her extremities  Skin: diffuse petechia extremities and trunk, some ecchymoses also noted on extremities but no spontaneous bleeding    Data Reviewed: I have personally reviewed following labs and imaging studies  CBC:  Recent Labs Lab 07/16/2015 1354 07/07/2015 1848 07/15/15 0149 07/15/15 0646 07/15/15 1842  WBC 15.0*  --  9.6 10.5 11.5*    NEUTROABS 13.0*  --   --  8.9*  --   HGB 6.8*  --  5.8* 6.5* 9.8*  HCT 20.4* 23.9* 18.6* 21.8* 30.1*  MCV 85.0  --  88.2 89.3 84.6  PLT <5* <5* <5* 24* <5*   Basic Metabolic Panel:  Recent Labs Lab 07/02/2015 1319 07/15/15 0149  NA 147* 147*  K 3.0* 3.7  CL 118* 124*  CO2 22 21*  GLUCOSE 129* 91  BUN 34* 24*  CREATININE 0.94 0.78  CALCIUM 8.0* 7.6*   Liver Function Tests:  Recent Labs Lab 07/12/2015 1319 07/15/15 0149  AST 110* 59*  ALT 64* 49  ALKPHOS 80 63  BILITOT 1.2 0.9  PROT 5.3* 4.6*  ALBUMIN 2.8* 2.2*   Coagulation Profile:  Recent Labs Lab 07/19/2015 1848 07/11/2015 2045  INR 1.33 1.41   CBG:  Recent Labs Lab 07/04/2015 2231  GLUCAP 104*  Anemia Panel:  Recent Labs  07/16/2015 1319 07/15/15 0646  VITAMINB12 687  --   FERRITIN 98  --   RETICCTPCT  --  2.7   Urine analysis:    Component Value Date/Time   COLORURINE AMBER* 07/12/2015 1531   APPEARANCEUR TURBID* 07/08/2015 1531   LABSPEC 1.023 07/08/2015 1531   PHURINE 5.5 06/26/2015 1531   GLUCOSEU NEGATIVE 07/10/2015 1531   HGBUR LARGE* 06/25/2015 1531   BILIRUBINUR NEGATIVE 07/13/2015 1531   KETONESUR NEGATIVE 07/04/2015 1531   PROTEINUR 30* 07/11/2015 1531   UROBILINOGEN 0.2 12/14/2013 1645   NITRITE POSITIVE* 07/13/2015 1531   LEUKOCYTESUR MODERATE* 07/17/2015 1531    Recent Results (from the past 240 hour(s))  Culture, blood (Routine x 2)     Status: None (Preliminary result)   Collection Time: 07/15/2015  1:19 PM  Result Value Ref Range Status   Specimen Description BLOOD LEFT ANTECUBITAL  Final   Special Requests BOTTLES DRAWN AEROBIC AND ANAEROBIC 5CC  Final   Culture   Final    NO GROWTH 1 DAY Performed at [facility]    Report Status PENDING  Incomplete  Culture, blood (Routine x 2)     Status: None (Preliminary result)   Collection Time: 07/13/2015  1:54 PM  Result Value Ref Range Status   Specimen Description BLOOD LEFT FOREARM  Final   Special Requests IN  PEDIATRIC BOTTLE 2 ML  Final   Culture   Final    NO GROWTH 1 DAY Performed at [facility]    Report Status PENDING  Incomplete  Urine culture     Status: Abnormal (Preliminary result)   Collection Time: 07/11/2015  3:31 PM  Result Value Ref Range Status   Specimen Description URINE, CATHETERIZED  Final   Special Requests NONE  Final   Culture >=100,000 COLONIES/mL ESCHERICHIA COLI (A)  Final   Report Status PENDING  Incomplete  MRSA PCR Screening     Status: None   Collection Time: 07/01/2015  8:37 PM  Result Value Ref Range Status   MRSA by PCR NEGATIVE NEGATIVE Final    Comment:        The GeneXpert MRSA Assay (FDA approved for NASAL specimens only), is one component of a comprehensive MRSA colonization surveillance program. It is not intended to diagnose MRSA infection nor to guide or monitor treatment for MRSA infections.       Radiology Studies: Dg Chest 2 View  06/23/2015  CLINICAL DATA:  Fever and rash. Altered mental status. Mild hypoxia. EXAM: CHEST  2 VIEW COMPARISON:  12/14/2013 FINDINGS: Lungs are hypoinflated with linear scarring/ atelectasis right base. Subtle chronic opacification of the left costophrenic angle. No acute airspace consolidation or effusion. Cardiomediastinal silhouette is within normal. There is calcified plaque over the thoracic aorta. Stable mild compression fracture over the lower thoracic spine. IMPRESSION: Hypoinflation without acute cardiopulmonary disease. Chronic bibasilar changes. Aortic atherosclerosis. Stable lower thoracic spine mild compression fracture. Electronically Signed   By: Marin Olp M.D.   On: 07/22/2015 13:49   Scheduled Meds: . antiseptic oral rinse  7 mL Mouth Rinse BID  . cefTRIAXone (ROCEPHIN)  IV  1 g Intravenous Q24H  . ferrous sulfate  325 mg Oral Q breakfast  . ipratropium-albuterol  3 mL Nebulization TID  . LORazepam  0.5 mg Oral Once  . metoprolol  25 mg Oral BID  . senna-docusate  1 tablet Oral Daily    . traMADol  50 mg Oral Once  . vancomycin  750 mg Intravenous Q24H  . vitamin B-12  1,000 mcg Oral QODAY   Continuous Infusions: . 0.9 % NaCl with KCl 40 mEq / L 50 mL/hr (07/16/15 0530)     LOS: 2 days    Time spent: 20 minutes   Faye Ramsay, MD [facility] Pager [phone]  If 7PM-7AM, please contact night-coverage [email or web address] Password [facility] 07/16/2015, 7:23 AM

## 2015-07-17 DIAGNOSIS — Z515 Encounter for palliative care: Secondary | ICD-10-CM

## 2015-07-17 DIAGNOSIS — E889 Metabolic disorder, unspecified: Secondary | ICD-10-CM

## 2015-07-17 DIAGNOSIS — F05 Delirium due to known physiological condition: Secondary | ICD-10-CM

## 2015-07-17 LAB — PREPARE PLATELET PHERESIS
UNIT DIVISION: 0
UNIT DIVISION: 0

## 2015-07-17 MED ORDER — MORPHINE BOLUS VIA INFUSION
1.0000 mg | INTRAVENOUS | Status: DC | PRN
  Administered 2015-07-17 – 2015-07-18 (×3): 1 mg via INTRAVENOUS
  Filled 2015-07-17 (×4): qty 2

## 2015-07-17 NOTE — Progress Notes (Signed)
14 Fr. Foley placed w/N.P. order d/t EOL status.  Placed w/sterile field and per protocol w/ Kaspar Albornoz and Lexi Potter,RNs at bedside.  Yellow cloudy urine w/sediment returned in catheter.  Son [name] aware of need for foley.

## 2015-07-17 NOTE — Progress Notes (Signed)
Daily Progress Note   Patient Name: [name]       Date: 07/17/2015 DOB: Jan 27, 1922  Age: 82 y.o. MRN#: [id number] Attending Physician: Theodis Blaze, MD Primary Care Physician: Hennie Duos, MD Admit Date: 06/30/2015  Reason for Consultation/Follow-up: Non pain symptom management, Pain control, Psychosocial/spiritual support and Terminal Care  Subjective: Pt now on MS04 infusion for dyspnea. She is still on a 100% NRB. Pt still has some work of breathing but much improved. Son in from [place].  Length of Stay: 3  Current Medications: Scheduled Meds:  . antiseptic oral rinse  7 mL Mouth Rinse BID    Continuous Infusions: . sodium chloride 10 mL/hr at 07/17/15 0700  . morphine 1 mg/hr (07/17/15 0700)    PRN Meds: [DISCONTINUED] acetaminophen **OR** acetaminophen, glycopyrrolate, haloperidol lactate, hydrALAZINE, ipratropium-albuterol, LORazepam, morphine, [DISCONTINUED] ondansetron **OR** ondansetron (ZOFRAN) IV  Physical Exam  Constitutional:  Acutely ill female  Cardiovascular:  irrg  Pulmonary/Chest:  Increased work of breathing. NRB  Genitourinary:  foley  Neurological:  unresponsive  Skin:  Cool. Faint mottling to feet  Nursing note and vitals reviewed.           Vital Signs: BP 139/98 mmHg  Pulse 106  Temp(Src) 98.1 F (36.7 C) (Axillary)  Resp 13  Ht 5\' 2"  (1.575 m)  Wt 63.1 kg (139 lb 1.8 oz)  BMI 25.44 kg/m2  SpO2 93% SpO2: SpO2: 93 % O2 Device: O2 Device: Venturi Mask O2 Flow Rate: O2 Flow Rate (L/min): 10 L/min  Intake/output summary:  Intake/Output Summary (Last 24 hours) at 07/17/15 1044 Last data filed at 07/17/15 0800  Gross per 24 hour  Intake 824.54 ml  Output      0 ml  Net 824.54 ml   LBM: Last BM Date: 07/15/15 Baseline Weight:  Weight: 59.875 kg (132 lb) Most recent weight: Weight: 63.1 kg (139 lb 1.8 oz)       Palliative Assessment/Data:    Flowsheet Rows        Most Recent Value   Intake Tab    Referral Department  Hospitalist   Unit at Time of Referral  Cardiac/Telemetry Unit   Palliative Care Primary Diagnosis  Sepsis/Infectious Disease   Date Notified  07/16/15   Palliative Care Type  New Palliative care   Reason for referral  Clarify Goals of Care, Counsel Regarding Hospice, Pain, Non-pain Symptom, End of Life Care Assistance   Date of Admission  07/04/2015   Date first seen by Palliative Care  07/16/15   # of days Palliative referral response time  0 Day(s)   # of days IP prior to Palliative referral  2   Clinical Assessment    Palliative Performance Scale Score  20%   Pain Max last 24 hours  Not able to report   Pain Min Last 24 hours  Not able to report   Dyspnea Max Last 24 Hours  Not able to report   Dyspnea Min Last 24 hours  Not able to report   Nausea Max Last 24 Hours  Not able to report   Nausea Min Last 24 Hours  Not able to report   Anxiety Max Last 24 Hours  Not able to report   Anxiety Min Last 24 Hours  Not able to report   Other Max Last 24 Hours  Not able to report   Psychosocial & Spiritual Assessment    Palliative Care Outcomes       Patient Active Problem List   Diagnosis Date Noted  . Palliative care encounter   . TTP (thrombotic thrombocytopenic purpura) ([place]) 07/07/2015  . Sepsis ([place]) 07/17/2015  . UTI (lower urinary tract infection) 07/12/2015  . Benign hypertensive heart disease without heart failure 12/02/2014  . PAD (peripheral artery disease) ([place]) 06/30/2014  . Loss of weight 06/18/2014  . Primary osteoarthritis involving multiple joints 05/17/2014  . Constipation 01/23/2014  . Anemia, iron deficiency 04/16/2012  . CAD (coronary artery disease) 04/06/2012  . Dementia of the Alzheimer's type, with late onset, with delusions ([place]) 12/28/2011  . GERD  (gastroesophageal reflux disease) 08/07/2011    Palliative Care Assessment & Plan   Patient Profile: Pt is a 82 yo female with a h/o CAD, HTN, dementia, admitted with AMS, fever , and found to have UTI as well as PNA. Admitted for sepsis. She is now unresponsive and appears to be actively dying.   Assessment: Pt having mild work of breathing. Actively dying.   Recommendations/Plan:  Transfer to [address]  Insert foley as pt appears uncomfortable with  turning  Will add bolus to MS04 infusion  Goals of Care and Additional Recommendations:  Limitations on Scope of Treatment: Full Comfort Care  Code Status:    Code Status Orders        Start     Ordered   07/04/2015 2030  Do not attempt resuscitation (DNR)   Continuous    Question Answer Comment  In the event of cardiac or respiratory ARREST Do not call a "code blue"   In the event of cardiac or respiratory ARREST Do not perform Intubation, CPR, defibrillation or ACLS   In the event of cardiac or respiratory ARREST Use medication by any route, position, wound care, and other measures to relive pain and suffering. May use oxygen, suction and manual treatment of airway obstruction as needed for comfort.      07/12/2015 2029    Code Status History    Date Active Date Inactive Code Status Order ID Comments User Context   12/15/2013  8:10 PM 12/17/2013  4:32 PM Full Code [id number]  Newt Minion, MD Inpatient   12/14/2013  7:10 PM 12/15/2013  8:10 PM DNR [id number]  Shanda Howells, MD ED   04/08/2013  1:07 PM 12/14/2013  7:10 PM DNR [id number] No cpr, defibrillation, intubation, mechanical ventilation  Gayland Curry, DO Outpatient   09/17/2012  9:18 PM 09/18/2012  8:10 PM DNR [id number]  Jonetta Osgood, MD Inpatient   09/09/2012  2:45 PM 09/12/2012  2:54 PM DNR [id number]  Monika Salk, MD ED   08/13/2012  4:38 PM 09/09/2012  2:45 PM DNR [id number]  Gayland Curry, DO Outpatient   04/07/2012  4:00 PM 04/10/2012  9:13 PM DNR [id number]  Thurnell Lose, MD Inpatient   04/06/2012  5:19 PM 04/07/2012  4:00 PM Full Code [id number]  Eugenie Filler, MD Inpatient   12/24/2011  6:01 PM 12/28/2011  3:49 PM Full Code [id number]  Theodis Blaze, MD ED    Advance Directive Documentation        Most Recent Value   Type of Advance Directive  Out of facility DNR (pink MOST or yellow form)   Pre-existing out of facility DNR order (yellow form or pink MOST form)  Yellow form placed in chart (order not valid for inpatient use)   "MOST" Form in Place?         Prognosis:   Hours - Days  Discharge Planning:  Anticipated Hospital Death    Thank you for allowing the Palliative Medicine Team to assist in the care of this patient.   Time In: 0800 Time Out: 0835 Total Time 35 min Prolonged Time Billed  no       Greater than 50%  of this time was spent counseling and coordinating care related to the above assessment and plan.  Dory Horn, NP  Please contact Palliative Medicine Team phone at [phone] for questions and concerns.

## 2015-07-17 NOTE — Progress Notes (Signed)
Patient ID: [name], female   DOB: 1922-12-08, 82 y.o.   MRN: [id number]   PROGRESS NOTE    [name]  [id number] DOB: 07/04/1922 DOA: 07/08/2015  PCP: Hennie Duos, MD   Brief Narrative:  82 y.o. female with a medical history of dementia, CAD, hypertension, presented to the emergency department for fever and rash. Patient resides in nursing facility. She supposedly was becoming more altered compared to her baseline.   ED Course: Found to have platelets of less than 5, hemoglobin of 6.8, white blood cell of 15. Patient given vancomycin and Zosyn in the emergency department. [facility] called for admission.  Assessment & Plan: Severe Sepsis with multiorgan system failure, likely secondary to UTI - pt met criteria for sepsis on admission with T > 100.3 F, HR > 90, WBC > 13 K, lactic acit > 22 - source appears to be E. Coli UTI  - no signs of PNA on CXR - pt was started on Vancomycin and Rocephin, rocephin changed to Primaxin on June 24th - pt has not responded to current medical measures and per discussions with son, comfort is main priority - we have discontinued ABX, IVF, no further blood work and no further transfusions to ensure comfort   Acute respiratory distress with hypoxia - Worsening respiratory status morning 07/16/2015 requiring placement on nonrebreather - as noted above, after discussion with son, transition to full comfort requested - pt placed on Morphine drip and looks more comfortable this AM  - PCT following as well  - pt is currently still on NRB but no tachypnea and no signs of discomfort   Suspect DIC - Dr. Jana Hakim consulted - reviewed blood film and it showed no schistocytes and therefore no need for plasmapheresis  - INR has been normal, PTT now normal (mildly elevated yesterday), this and lack of schistocytes challenges the working diagnosis of DIC (which however remains the safest way to approach this case per Dr. Jana Hakim) - Patient had 2 units  of PRBC transfused 07/15/2015 with appropriate increase in posttransfusion hemoglobin from 6.5 --> 9.7 - However, posttransfusion platelets even though increased from 5 --> 24 (07/15/2015), platelets down to 7 on June 24th - Son was made aware that his mother was not responding to current medical measures and it appeared that she was getting more uncomfortable with transfusions and frequent blood work - son again very clear that he wants [place] to do everything we can to keep his mother comfortable and we have discussed placing pt on Morphine drip, he has agreed with this  - will notify pt's PCP of these changes   Acute metabolic encephalopathy - imposed on known dementia - secondary to the above problems  - still lethargic but no signs of distress - keep on morphine drip   Normocytic anemia - unknown if related to DIC - Hemoglobin up appropriately after transfusion of 2 units of blood  - no further blood work to ensure comfort   Hypernatremia and hyperchloremia - likely pre renal in etiology  - again as noted above, no further blood work   Hypokalemia - supplemented but pt was clearly uncomfortable with infusions - this was stopped in an effort to ensure comfort   History of CAD and PAD - aspirin has been on hold secondary to thrombocytopenia   Essential hypertension - BP reasonably stable this AM  Thoracic spine compression fracture - Stable on x-ray  DVT prophylaxis: None (platelets <5, petechiae on lower extremities  Code Status:  DNR Family Communication: spoke with son over the phone, transition to full comfort is desired  Disposition Plan: Pending   Consultants:   Oncology  PCT  Procedures:   None  Antimicrobials:   Vanc and Rocephin 6/22 --> 07/16/2015  Subjective: Pt remains lethargic, no signs of distress.   Objective: Filed Vitals:   07/17/15 0200 07/17/15 0351 07/17/15 0400 07/17/15 0600  BP: 125/65 136/52 133/57 137/86  Pulse: 87 94 95 111  Temp:   98.1 F (36.7 C)    TempSrc:  Axillary    Resp: '14 13 14 20  ' Height:      Weight:      SpO2: 95% 92% 92% 93%    Intake/Output Summary (Last 24 hours) at 07/17/15 0838 Last data filed at 07/17/15 0600  Gross per 24 hour  Intake 902.54 ml  Output      0 ml  Net 902.54 ml   Filed Weights   07/17/2015 2030 07/15/15 0310 07/16/15 0500  Weight: 61 kg (134 lb 7.7 oz) 61 kg (134 lb 7.7 oz) 63.1 kg (139 lb 1.8 oz)    Examination:  General exam: Lethargic, on nonrebreather, NAD Respiratory system: Poor inspiratory effort, diminished breath sounds throughout with crackles   Cardiovascular system: tachycardic Gastrointestinal system: Abdomen is nondistended, soft and nontender.  Central nervous system: lethargic, unable to open eyes, not moving her extremities  Skin: diffuse petechia extremities and trunk, some ecchymoses also noted on extremities but no spontaneous bleeding, significant upper extremities swelling   Data Reviewed: I have personally reviewed following labs and imaging studies  CBC:  Recent Labs Lab 07/08/2015 1354 07/21/2015 1848 07/15/15 0149 07/15/15 0646 07/15/15 1842 07/16/15 0638  WBC 15.0*  --  9.6 10.5 11.5* 11.7*  NEUTROABS 13.0*  --   --  8.9*  --  10.1*  HGB 6.8*  --  5.8* 6.5* 9.8* 9.7*  HCT 20.4* 23.9* 18.6* 21.8* 30.1* 30.7*  MCV 85.0  --  88.2 89.3 84.6 85.8  PLT <5* <5* <5* 24* <5* 7*   Basic Metabolic Panel:  Recent Labs Lab 07/13/2015 1319 07/15/15 0149 07/16/15 0638  NA 147* 147* 146*  K 3.0* 3.7 3.6  CL 118* 124* 116*  CO2 22 21* 19*  GLUCOSE 129* 91 107*  BUN 34* 24* 15  CREATININE 0.94 0.78 0.80  CALCIUM 8.0* 7.6* 9.1   Liver Function Tests:  Recent Labs Lab 07/20/2015 1319 07/15/15 0149 07/16/15 0638  AST 110* 59* 43*  ALT 64* 49 43  ALKPHOS 80 63 83  BILITOT 1.2 0.9 2.7*  PROT 5.3* 4.6* 6.3*  ALBUMIN 2.8* 2.2* 3.2*   Coagulation Profile:  Recent Labs Lab 07/13/2015 1848 07/09/2015 2045  INR 1.33 1.41   CBG:  Recent  Labs Lab 07/01/2015 2231  GLUCAP 104*   Anemia Panel:  Recent Labs  07/03/2015 1319 07/15/15 0646  VITAMINB12 687  --   FERRITIN 98  --   RETICCTPCT  --  2.7   Urine analysis:    Component Value Date/Time   COLORURINE AMBER* 07/04/2015 1531   APPEARANCEUR TURBID* 07/02/2015 1531   LABSPEC 1.023 07/20/2015 1531   PHURINE 5.5 07/12/2015 1531   GLUCOSEU NEGATIVE 07/17/2015 1531   HGBUR LARGE* 07/05/2015 1531   BILIRUBINUR NEGATIVE 06/23/2015 1531   KETONESUR NEGATIVE 07/13/2015 1531   PROTEINUR 30* 07/07/2015 1531   UROBILINOGEN 0.2 12/14/2013 1645   NITRITE POSITIVE* 07/19/2015 1531   LEUKOCYTESUR MODERATE* 06/29/2015 1531    Recent Results (from the past 240 hour(s))  Culture, blood (Routine x 2)     Status: None (Preliminary result)   Collection Time: 06/23/2015  1:19 PM  Result Value Ref Range Status   Specimen Description BLOOD LEFT ANTECUBITAL  Final   Special Requests BOTTLES DRAWN AEROBIC AND ANAEROBIC 5CC  Final   Culture   Final    NO GROWTH 2 DAYS Performed at [facility]    Report Status PENDING  Incomplete  Culture, blood (Routine x 2)     Status: None (Preliminary result)   Collection Time: 07/04/2015  1:54 PM  Result Value Ref Range Status   Specimen Description BLOOD LEFT FOREARM  Final   Special Requests IN PEDIATRIC BOTTLE 2 ML  Final   Culture   Final    NO GROWTH 2 DAYS Performed at [facility]    Report Status PENDING  Incomplete  Urine culture     Status: Abnormal   Collection Time: 07/08/2015  3:31 PM  Result Value Ref Range Status   Specimen Description URINE, CATHETERIZED  Final   Special Requests NONE  Final   Culture (A)  Final    >=100,000 COLONIES/mL ESCHERICHIA COLI Confirmed Extended Spectrum Beta-Lactamase Producer (ESBL) Performed at [facility] [place]    Report Status 07/16/2015 FINAL  Final   Organism ID, Bacteria ESCHERICHIA COLI (A)  Final      Susceptibility   Escherichia coli - MIC*    AMPICILLIN >=32  RESISTANT Resistant     CEFAZOLIN >=64 RESISTANT Resistant     CEFTRIAXONE >=64 RESISTANT Resistant     CIPROFLOXACIN >=4 RESISTANT Resistant     GENTAMICIN <=1 SENSITIVE Sensitive     IMIPENEM <=0.25 SENSITIVE Sensitive     NITROFURANTOIN 256 RESISTANT Resistant     TRIMETH/SULFA <=20 SENSITIVE Sensitive     AMPICILLIN/SULBACTAM >=32 RESISTANT Resistant     PIP/TAZO 64 INTERMEDIATE Intermediate     * >=100,000 COLONIES/mL ESCHERICHIA COLI  MRSA PCR Screening     Status: None   Collection Time: 07/21/2015  8:37 PM  Result Value Ref Range Status   MRSA by PCR NEGATIVE NEGATIVE Final    Comment:        The GeneXpert MRSA Assay (FDA approved for NASAL specimens only), is one component of a comprehensive MRSA colonization surveillance program. It is not intended to diagnose MRSA infection nor to guide or monitor treatment for MRSA infections.       Radiology Studies: Dg Chest Port 1 View  07/16/2015  CLINICAL DATA:  Dyspnea, severe shortness of breath last night. EXAM: PORTABLE CHEST 1 VIEW COMPARISON:  Chest x-rays dated  12/14/2013. FINDINGS: Cardiomediastinal silhouette is stable. There are new patchy airspace opacities bilaterally, perihilar distribution, right greater than left. Suspect small bilateral pleural effusions. No pneumothorax seen. Old healed rib fractures on the right. No acute appearing osseous abnormality. IMPRESSION: 1. New patchy airspace opacities bilaterally, perihilar distribution, right greater than left, most likely pulmonary edema. 2. Small bilateral pleural effusions and/or mild bibasilar atelectasis. Electronically Signed   By: Franki Cabot M.D.   On: 07/16/2015 07:24   Scheduled Meds: . antiseptic oral rinse  7 mL Mouth Rinse BID   Continuous Infusions: . sodium chloride 10 mL/hr at 07/17/15 0600  . morphine 1 mg/hr (07/17/15 0600)     LOS: 3 days    Time spent: 20 minutes   Faye Ramsay, MD [facility] Pager  [phone]  If 7PM-7AM, please contact night-coverage [email or web address] Password [facility] 07/17/2015, 8:38 AM

## 2015-07-18 DIAGNOSIS — R0600 Dyspnea, unspecified: Secondary | ICD-10-CM

## 2015-07-19 LAB — CULTURE, BLOOD (ROUTINE X 2)
CULTURE: NO GROWTH
CULTURE: NO GROWTH

## 2015-07-23 NOTE — Progress Notes (Signed)
Nutrition Brief Note  Chart reviewed. Pt now transitioning to comfort care.  No further nutrition interventions warranted at this time.  Please re-consult as needed.   Jeliyah Middlebrooks A. Hildagard Sobecki, RD, LDN, CDE Pager: [phone] After hours Pager: [phone]  

## 2015-07-23 NOTE — Care Management Important Message (Signed)
Important Message  Patient Details  Name: [name] MRN: [id number] Date of Birth: Mar 31, 1922   Medicare Important Message Given:  Yes    [name] 08/08/15, 9:03 AM

## 2015-07-23 NOTE — Progress Notes (Signed)
NPO

## 2015-07-23 NOTE — Discharge Summary (Addendum)
Death Summary  [name] [id number] DOB: 01/03/1923 DOA: Jul 24, 2015  PCP: Hennie Duos, MD  Admit date: 2015-07-24 Date of Death: 2015/07/28 Time of Death: 14-Mar-2022 am Notification: Hennie Duos, MD notified of death  History of present illness:  82 y.o. female with a medical history of dementia, CAD, hypertension, presented to the emergency department for fever and rash. Patient resides in nursing facility. She supposedly was becoming more altered compared to her baseline.   ED Course: Found to have platelets of less than 5, hemoglobin of 6.8, white blood cell of 15. Patient given vancomycin and Zosyn in the emergency department. [facility] called for admission.  Assessment & Plan: Severe Sepsis with multiorgan system failure, likely secondary to UTI - pt met criteria for sepsis on admission with T > 100.3 F, HR > 90, WBC > 13 K, lactic acit > 22 - source appears to be E. Coli UTI  - no signs of PNA on CXR - pt was started on Vancomycin and Rocephin, rocephin changed to Primaxin on June 24th - pt has not responded to medical measures and per discussions with son, comfort was main priority - we have discontinued ABX, IVF, no further blood work and no further transfusions to ensure comfort  - son was grateful for our help enuring his mother comfort, he said to me: "I'am at peace and thank you"   Acute respiratory failure with hypoxia secondary to pulmonary vascular congestion  - Worsening respiratory status morning 07/16/2015 requiring placement on nonrebreather - likely from blood products infusions  - as noted above, after discussion with son, transition to full comfort requested - pt placed on Morphine drip  Suspect DIC - Dr. Jana Hakim consulted - reviewed blood film and it showed no schistocytes and therefore no need for plasmapheresis  - INR has been normal, PTT now normal (mildly elevated yesterday), this and lack of schistocytes challenges the working diagnosis of DIC  (which however remains the safest way to approach this case per Dr. Jana Hakim) - Patient had 2 units of PRBC transfused 07/15/2015 with appropriate increase in posttransfusion hemoglobin from 6.5 --> 9.7 - However, posttransfusion platelets even though increased from 5 --> 24 (07/15/2015), platelets down to 7 on June 24th - Son was made aware that his mother was not responding to current medical measures and it appeared that she was getting more uncomfortable with transfusions and frequent blood work - son again very clear that he wants [place] to do everything we can to keep his mother comfortable and we have discussed placing pt on Morphine drip, he has agreed with this  - his mother passed away ~ 14 in comfort   Acute metabolic encephalopathy - imposed on known dementia - secondary to the above problems   Normocytic anemia - unknown if related to DIC - Hemoglobin up appropriately after transfusion of 2 units of blood  - no further blood work to ensure comfort as noted above   Hypernatremia and hyperchloremia - likely pre renal in etiology   Hypokalemia - supplemented but pt was clearly uncomfortable with infusions  History of CAD and PAD - aspirin has been on hold secondary to thrombocytopenia   Essential hypertension - BP reasonably stable this AM  Thoracic spine compression fracture - Stable on x-ray  DVT prophylaxis: None (platelets <5, petechiae on lower extremities  Code Status: DNR Family Communication: son at bedside updated and was appreciative of our effort to ensure his mother's comfort  Consultants:   Oncology  PCT  Procedures:   None  Antimicrobials:   Vanc and Rocephin 6/22 --> 07/16/2015   The results of significant diagnostics from this hospitalization (including imaging, microbiology, ancillary and laboratory) are listed below for reference.    Significant Diagnostic Studies: Dg Chest 2 View  07/10/2015  CLINICAL DATA:  Fever and rash. Altered  mental status. Mild hypoxia. EXAM: CHEST  2 VIEW COMPARISON:  12/14/2013 FINDINGS: Lungs are hypoinflated with linear scarring/ atelectasis right base. Subtle chronic opacification of the left costophrenic angle. No acute airspace consolidation or effusion. Cardiomediastinal silhouette is within normal. There is calcified plaque over the thoracic aorta. Stable mild compression fracture over the lower thoracic spine. IMPRESSION: Hypoinflation without acute cardiopulmonary disease. Chronic bibasilar changes. Aortic atherosclerosis. Stable lower thoracic spine mild compression fracture. Electronically Signed   By: Marin Olp M.D.   On: 07/12/2015 13:49   Dg Chest Port 1 View  07/16/2015  CLINICAL DATA:  Dyspnea, severe shortness of breath last night. EXAM: PORTABLE CHEST 1 VIEW COMPARISON:  Chest x-rays dated 07/11/2015 12/14/2013. FINDINGS: Cardiomediastinal silhouette is stable. There are new patchy airspace opacities bilaterally, perihilar distribution, right greater than left. Suspect small bilateral pleural effusions. No pneumothorax seen. Old healed rib fractures on the right. No acute appearing osseous abnormality. IMPRESSION: 1. New patchy airspace opacities bilaterally, perihilar distribution, right greater than left, most likely pulmonary edema. 2. Small bilateral pleural effusions and/or mild bibasilar atelectasis. Electronically Signed   By: Franki Cabot M.D.   On: 07/16/2015 07:24    Microbiology: Recent Results (from the past 240 hour(s))  Culture, blood (Routine x 2)     Status: None (Preliminary result)   Collection Time: 06/27/2015  1:19 PM  Result Value Ref Range Status   Specimen Description BLOOD LEFT ANTECUBITAL  Final   Special Requests BOTTLES DRAWN AEROBIC AND ANAEROBIC 5CC  Final   Culture   Final    NO GROWTH 3 DAYS Performed at [facility]    Report Status PENDING  Incomplete  Culture, blood (Routine x 2)     Status: None (Preliminary result)   Collection Time:  07/04/2015  1:54 PM  Result Value Ref Range Status   Specimen Description BLOOD LEFT FOREARM  Final   Special Requests IN PEDIATRIC BOTTLE 2 ML  Final   Culture   Final    NO GROWTH 3 DAYS Performed at [facility]    Report Status PENDING  Incomplete  Urine culture     Status: Abnormal   Collection Time: 07/09/2015  3:31 PM  Result Value Ref Range Status   Specimen Description URINE, CATHETERIZED  Final   Special Requests NONE  Final   Culture (A)  Final    >=100,000 COLONIES/mL ESCHERICHIA COLI Confirmed Extended Spectrum Beta-Lactamase Producer (ESBL) Performed at [facility]    Report Status 07/16/2015 FINAL  Final   Organism ID, Bacteria ESCHERICHIA COLI (A)  Final      Susceptibility   Escherichia coli - MIC*    AMPICILLIN >=32 RESISTANT Resistant     CEFAZOLIN >=64 RESISTANT Resistant     CEFTRIAXONE >=64 RESISTANT Resistant     CIPROFLOXACIN >=4 RESISTANT Resistant     GENTAMICIN <=1 SENSITIVE Sensitive     IMIPENEM <=0.25 SENSITIVE Sensitive     NITROFURANTOIN 256 RESISTANT Resistant     TRIMETH/SULFA <=20 SENSITIVE Sensitive     AMPICILLIN/SULBACTAM >=32 RESISTANT Resistant     PIP/TAZO 64 INTERMEDIATE Intermediate     * >=100,000 COLONIES/mL ESCHERICHIA COLI  MRSA PCR Screening     Status: None   Collection Time: 07/09/2015  8:37 PM  Result Value Ref Range Status   MRSA by PCR NEGATIVE NEGATIVE Final    Comment:        The GeneXpert MRSA Assay (FDA approved for NASAL specimens only), is one component of a comprehensive MRSA colonization surveillance program. It is not intended to diagnose MRSA infection nor to guide or monitor treatment for MRSA infections.      Labs: Basic Metabolic Panel:  Recent Labs Lab 07/06/2015 1319 07/15/15 0149 07/16/15 0638  NA 147* 147* 146*  K 3.0* 3.7 3.6  CL 118* 124* 116*  CO2 22 21* 19*  GLUCOSE 129* 91 107*  BUN 34* 24* 15  CREATININE 0.94 0.78 0.80  CALCIUM 8.0* 7.6* 9.1   Liver Function  Tests:  Recent Labs Lab 07/17/2015 1319 07/15/15 0149 07/16/15 0638  AST 110* 59* 43*  ALT 64* 49 43  ALKPHOS 80 63 83  BILITOT 1.2 0.9 2.7*  PROT 5.3* 4.6* 6.3*  ALBUMIN 2.8* 2.2* 3.2*   CBC:  Recent Labs Lab 07/02/2015 1354 07/16/2015 1848 07/15/15 0149 07/15/15 0646 07/15/15 1842 07/16/15 0638  WBC 15.0*  --  9.6 10.5 11.5* 11.7*  NEUTROABS 13.0*  --   --  8.9*  --  10.1*  HGB 6.8*  --  5.8* 6.5* 9.8* 9.7*  HCT 20.4* 23.9* 18.6* 21.8* 30.1* 30.7*  MCV 85.0  --  88.2 89.3 84.6 85.8  PLT <5* <5* <5* 24* <5* 7*   Cardiac Enzymes: No results for input(s): CKTOTAL, CKMB, CKMBINDEX, TROPONINI in the last 168 hours. D-Dimer No results for input(s): DDIMER in the last 72 hours. BNP: Invalid input(s): POCBNP CBG:  Recent Labs Lab 06/30/2015 2231  GLUCAP 104*   Anemia work up No results for input(s): VITAMINB12, FOLATE, FERRITIN, TIBC, IRON, RETICCTPCT in the last 72 hours. Urinalysis    Component Value Date/Time   COLORURINE AMBER* 07/16/2015 1531   APPEARANCEUR TURBID* 07/21/2015 1531   LABSPEC 1.023 07/16/2015 1531   PHURINE 5.5 06/27/2015 1531   GLUCOSEU NEGATIVE 07/03/2015 1531   HGBUR LARGE* 07/07/2015 1531   BILIRUBINUR NEGATIVE 07/10/2015 1531   KETONESUR NEGATIVE 07/19/2015 1531   PROTEINUR 30* 06/27/2015 1531   UROBILINOGEN 0.2 12/14/2013 1645   NITRITE POSITIVE* 07/21/2015 1531   LEUKOCYTESUR MODERATE* 07/16/2015 1531   SIGNED:  Faye Ramsay, MD  [facility] 2015-07-29, 11:00 AM Pager [phone]  If 7PM-7AM, please contact night-coverage [email or web address] Password [email or web address]

## 2015-07-23 NOTE — Progress Notes (Signed)
COURTESY NOTE: Events over weekend noted. Patient now comfort care only, not expected to survive hospitalization, Palliative care on board and working with family.  Will sign off. Please let me know if we can be of any help from this point.

## 2015-07-23 NOTE — Progress Notes (Signed)
120cc of Morphine wasted with Ben Bhunu.

## 2015-07-23 NOTE — Progress Notes (Signed)
I asked the Pt. Family member if they wanted me to give the Pt. A bath but he stated to just let her rest . I inform pt. Family member if he need anything else to please not hesitate to call me and that i was going to be making my rounds

## 2015-07-23 NOTE — Progress Notes (Addendum)
Daily Progress Note   Patient Name: [name]       Date: 08/01/15 DOB: Nov 05, 1922  Age: 82 y.o. MRN#: [id number] Attending Physician: No att. providers found Primary Care Physician: Hennie Duos, MD Admit Date: 07/20/2015  Reason for Consultation/Follow-up: Non pain symptom management, Pain control, Psychosocial/spiritual support and Terminal Care  Subjective: [name] actively dying. I met today with her son, [name], in her room.   Her son reports that she has been comfortable since he has been here this morning. We discussed continuing to make modifications to regimen if necessary to maintain her comfort, but I felt that she was very comfortable based my examination this morning.  Provided anticipatory guidance on what to expect as she approaches the end of her life.  She is currently on MS04 infusion for dyspnea. She is still on a NRB.   Length of Stay: 4  Current Medications: Scheduled Meds:  . antiseptic oral rinse  7 mL Mouth Rinse BID    Continuous Infusions: . sodium chloride 10 mL/hr at 07/17/15 0700  . morphine 5 mg/hr (07/17/15 2318)    PRN Meds: [DISCONTINUED] acetaminophen **OR** acetaminophen, glycopyrrolate, haloperidol lactate, hydrALAZINE, ipratropium-albuterol, LORazepam, morphine, [DISCONTINUED] ondansetron **OR** ondansetron (ZOFRAN) IV  Physical Exam  Constitutional:  Acutely ill female  Cardiovascular:  irrg  Pulmonary/Chest:  Increased work of breathing. NRB  Genitourinary:  foley  Neurological:  unresponsive  Skin:  Cool. Faint mottling to feet  Nursing note and vitals reviewed.           Vital Signs: BP 97/33 mmHg  Pulse 111  Temp(Src) 101.6 F (38.7 C) (Axillary)  Resp 13  Ht '5\' 2"'  (1.575 m)  Wt 63.1 kg (139 lb 1.8 oz)   BMI 25.44 kg/m2  SpO2 75% SpO2: SpO2: (!) 75 % O2 Device: O2 Device: Venturi Mask O2 Flow Rate: O2 Flow Rate (L/min): 10 L/min  Intake/output summary:   Intake/Output Summary (Last 24 hours) at 01-Aug-2015 1332 Last data filed at Aug 01, 2015 0840  Gross per 24 hour  Intake  91.65 ml  Output    325 ml  Net -233.35 ml   LBM: Last BM Date: 07/15/15 Baseline Weight: Weight: 59.875 kg (132 lb) Most recent weight: Weight: 63.1 kg (139 lb 1.8 oz)       Palliative Assessment/Data: PPS 10%  Flowsheet Rows        Most Recent Value   Intake Tab    Referral Department  Hospitalist   Unit at Time of Referral  Cardiac/Telemetry Unit   Palliative Care Primary Diagnosis  Sepsis/Infectious Disease   Date Notified  07/16/15   Palliative Care Type  New Palliative care   Reason for referral  Clarify Goals of Care, Counsel Regarding Hospice, Pain, Non-pain Symptom, End of Life Care Assistance   Date of Admission  07/10/2015   Date first seen by Palliative Care  07/16/15   # of days Palliative referral response time  0 Day(s)   # of days IP prior to Palliative referral  2   Clinical Assessment    Palliative Performance Scale Score  20%   Pain Max last 24 hours  Not able to report   Pain Min Last 24 hours  Not able to report   Dyspnea Max Last 24 Hours  Not able to report   Dyspnea Min Last 24 hours  Not able to report   Nausea Max Last 24 Hours  Not able to report   Nausea Min Last 24 Hours  Not able to report   Anxiety Max Last 24 Hours  Not able to report   Anxiety Min Last 24 Hours  Not able to report   Other Max Last 24 Hours  Not able to report   Psychosocial & Spiritual Assessment    Palliative Care Outcomes       Patient Active Problem List   Diagnosis Date Noted  . Palliative care encounter   . TTP (thrombotic thrombocytopenic purpura) ([place])   . Sepsis ([place]) 07/15/2015  . UTI (lower urinary tract infection) 07/04/2015  . Benign hypertensive heart disease without heart  failure 12/02/2014  . PAD (peripheral artery disease) ([place]) 06/30/2014  . Loss of weight 06/18/2014  . Primary osteoarthritis involving multiple joints 05/17/2014  . Constipation 01/23/2014  . Anemia, iron deficiency 04/16/2012  . CAD (coronary artery disease) 04/06/2012  . Dementia of the Alzheimer's type, with late onset, with delusions ([place]) 12/28/2011  . GERD (gastroesophageal reflux disease) 08/07/2011    Palliative Care Assessment & Plan   Patient Profile: Pt is a 82 yo female with a h/o CAD, HTN, dementia, admitted with AMS, fever , and found to have UTI as well as PNA. Admitted for sepsis. She is actively dying.   Assessment: Appears comfortablr. Actively dying.   Recommendations/Plan: - Dyspnea: Well controlled on current regimen. Continue morphine infusion - Comfort care only. Actively dying. I suspect her prognosis to be a matter of hours at best. - Reviewed PRN medication usage.  Agree with current regimen. Continue current care - I met with her son in her room today. Provided anticipatory guidance and answered all questions the best of my ability.  Goals of Care and Additional Recommendations:  Limitations on Scope of Treatment: Full Comfort Care  Code Status:    Code Status Orders        Start     Ordered   07/07/2015 2030  Do not attempt resuscitation (DNR)   Continuous    Question Answer Comment  In the event of cardiac or respiratory ARREST Do not call a "code blue"   In the event of cardiac or respiratory ARREST Do not perform Intubation, CPR, defibrillation or ACLS   In the event of cardiac or respiratory ARREST Use medication by any route, position, wound care, and other measures to relive pain and suffering. May  use oxygen, suction and manual treatment of airway obstruction as needed for comfort.      07/05/2015 2029    Code Status History    Date Active Date Inactive Code Status Order ID Comments User Context   12/15/2013  8:10 PM 12/17/2013  4:32 PM  Full Code [id number]  Newt Minion, MD Inpatient   12/14/2013  7:10 PM 12/15/2013  8:10 PM DNR [id number]  Shanda Howells, MD ED   04/08/2013  1:07 PM 12/14/2013  7:10 PM DNR [id number] No cpr, defibrillation, intubation, mechanical ventilation Gayland Curry, DO Outpatient   09/17/2012  9:18 PM 09/18/2012  8:10 PM DNR [id number]  Jonetta Osgood, MD Inpatient   09/09/2012  2:45 PM 09/12/2012  2:54 PM DNR [id number]  Monika Salk, MD ED   08/13/2012  4:38 PM 09/09/2012  2:45 PM DNR [id number]  Gayland Curry, DO Outpatient   04/07/2012  4:00 PM 04/10/2012  9:13 PM DNR [id number]  Thurnell Lose, MD Inpatient   04/06/2012  5:19 PM 04/07/2012  4:00 PM Full Code [id number]  Eugenie Filler, MD Inpatient   12/24/2011  6:01 PM 12/28/2011  3:49 PM Full Code [id number]  Theodis Blaze, MD ED    Advance Directive Documentation        Most Recent Value   Type of Advance Directive  Out of facility DNR (pink MOST or yellow form)   Pre-existing out of facility DNR order (yellow form or pink MOST form)  Yellow form placed in chart (order not valid for inpatient use)   "MOST" Form in Place?         Prognosis:   Hours - Days  Discharge Planning:  Anticipated Hospital Death    Thank you for allowing the Palliative Medicine Team to assist in the care of this patient.   Time In: 0900 Time Out: 0930 Total Time 30 Prolonged Time Billed  no       Greater than 50%  of this time was spent counseling and coordinating care related to the above assessment and plan.  Micheline Rough, MD  Please contact Palliative Medicine Team phone at [phone] for questions and concerns.

## 2015-07-23 DEATH — deceased
